# Patient Record
Sex: Male | Born: 1940 | Race: White | Hispanic: No | Smoking: Former smoker
Health system: Southern US, Community
[De-identification: ages and names within clinical notes are randomized; demographics above are authoritative.]

## PROBLEM LIST (undated history)

## (undated) DIAGNOSIS — I509 Heart failure, unspecified: Secondary | ICD-10-CM

## (undated) DIAGNOSIS — K5520 Angiodysplasia of colon without hemorrhage: Secondary | ICD-10-CM

## (undated) DIAGNOSIS — Z950 Presence of cardiac pacemaker: Secondary | ICD-10-CM

## (undated) DIAGNOSIS — J449 Chronic obstructive pulmonary disease, unspecified: Secondary | ICD-10-CM

## (undated) DIAGNOSIS — K922 Gastrointestinal hemorrhage, unspecified: Secondary | ICD-10-CM

## (undated) DIAGNOSIS — I10 Essential (primary) hypertension: Secondary | ICD-10-CM

## (undated) DIAGNOSIS — I4891 Unspecified atrial fibrillation: Secondary | ICD-10-CM

## (undated) HISTORY — DX: Essential (primary) hypertension: I10

## (undated) HISTORY — PX: PACEMAKER IMPLANT: EP1218

## (undated) HISTORY — DX: Chronic obstructive pulmonary disease, unspecified: J44.9

## (undated) HISTORY — PX: COLONOSCOPY: SHX174

## (undated) HISTORY — DX: Angiodysplasia of colon without hemorrhage: K55.20

## (undated) HISTORY — DX: Unspecified atrial fibrillation: I48.91

## (undated) HISTORY — DX: Heart failure, unspecified: I50.9

## (undated) HISTORY — DX: Gastrointestinal hemorrhage, unspecified: K92.2

---

## 2015-06-15 ENCOUNTER — Encounter: Payer: Self-pay | Admitting: Cardiology

## 2015-06-15 LAB — PROTIME-INR

## 2016-01-25 ENCOUNTER — Encounter: Payer: Self-pay | Admitting: Cardiology

## 2016-01-25 LAB — PROTIME-INR

## 2016-02-22 LAB — PROTIME-INR

## 2016-04-04 LAB — PROTIME-INR

## 2016-05-02 LAB — PROTIME-INR

## 2016-05-16 LAB — PROTIME-INR

## 2016-05-31 LAB — PROTIME-INR

## 2016-06-16 LAB — PROTIME-INR

## 2016-06-30 LAB — PROTIME-INR

## 2016-07-13 LAB — PROTIME-INR

## 2016-11-22 DIAGNOSIS — F3111 Bipolar disorder, current episode manic without psychotic features, mild: Secondary | ICD-10-CM | POA: Diagnosis not present

## 2016-11-23 DIAGNOSIS — R7989 Other specified abnormal findings of blood chemistry: Secondary | ICD-10-CM | POA: Diagnosis not present

## 2016-11-23 DIAGNOSIS — Z79899 Other long term (current) drug therapy: Secondary | ICD-10-CM | POA: Diagnosis not present

## 2016-11-28 DIAGNOSIS — I4891 Unspecified atrial fibrillation: Secondary | ICD-10-CM | POA: Diagnosis not present

## 2016-11-28 DIAGNOSIS — Z95810 Presence of automatic (implantable) cardiac defibrillator: Secondary | ICD-10-CM | POA: Diagnosis not present

## 2016-11-28 DIAGNOSIS — I428 Other cardiomyopathies: Secondary | ICD-10-CM | POA: Diagnosis not present

## 2016-11-28 DIAGNOSIS — Z4502 Encounter for adjustment and management of automatic implantable cardiac defibrillator: Secondary | ICD-10-CM | POA: Diagnosis not present

## 2016-12-27 DIAGNOSIS — I10 Essential (primary) hypertension: Secondary | ICD-10-CM | POA: Diagnosis not present

## 2016-12-27 DIAGNOSIS — J449 Chronic obstructive pulmonary disease, unspecified: Secondary | ICD-10-CM | POA: Diagnosis not present

## 2016-12-27 DIAGNOSIS — E785 Hyperlipidemia, unspecified: Secondary | ICD-10-CM | POA: Diagnosis not present

## 2017-03-29 DIAGNOSIS — I442 Atrioventricular block, complete: Secondary | ICD-10-CM | POA: Diagnosis not present

## 2017-03-29 DIAGNOSIS — I4891 Unspecified atrial fibrillation: Secondary | ICD-10-CM | POA: Diagnosis not present

## 2017-03-29 DIAGNOSIS — I428 Other cardiomyopathies: Secondary | ICD-10-CM | POA: Diagnosis not present

## 2017-03-29 DIAGNOSIS — Z4502 Encounter for adjustment and management of automatic implantable cardiac defibrillator: Secondary | ICD-10-CM | POA: Diagnosis not present

## 2017-05-16 DIAGNOSIS — R7989 Other specified abnormal findings of blood chemistry: Secondary | ICD-10-CM | POA: Diagnosis not present

## 2017-05-16 DIAGNOSIS — Z79899 Other long term (current) drug therapy: Secondary | ICD-10-CM | POA: Diagnosis not present

## 2017-05-16 DIAGNOSIS — F3111 Bipolar disorder, current episode manic without psychotic features, mild: Secondary | ICD-10-CM | POA: Diagnosis not present

## 2017-06-07 DIAGNOSIS — Z23 Encounter for immunization: Secondary | ICD-10-CM | POA: Diagnosis not present

## 2017-06-28 DIAGNOSIS — Z79899 Other long term (current) drug therapy: Secondary | ICD-10-CM | POA: Diagnosis not present

## 2017-06-28 DIAGNOSIS — E785 Hyperlipidemia, unspecified: Secondary | ICD-10-CM | POA: Diagnosis not present

## 2017-06-28 DIAGNOSIS — Z125 Encounter for screening for malignant neoplasm of prostate: Secondary | ICD-10-CM | POA: Diagnosis not present

## 2017-06-28 DIAGNOSIS — R7301 Impaired fasting glucose: Secondary | ICD-10-CM | POA: Diagnosis not present

## 2017-06-28 DIAGNOSIS — I10 Essential (primary) hypertension: Secondary | ICD-10-CM | POA: Diagnosis not present

## 2017-06-28 DIAGNOSIS — R5383 Other fatigue: Secondary | ICD-10-CM | POA: Diagnosis not present

## 2017-06-28 DIAGNOSIS — Z0000 Encounter for general adult medical examination without abnormal findings: Secondary | ICD-10-CM | POA: Diagnosis not present

## 2017-06-28 DIAGNOSIS — Z23 Encounter for immunization: Secondary | ICD-10-CM | POA: Diagnosis not present

## 2017-06-28 DIAGNOSIS — E569 Vitamin deficiency, unspecified: Secondary | ICD-10-CM | POA: Diagnosis not present

## 2017-06-28 DIAGNOSIS — Z1211 Encounter for screening for malignant neoplasm of colon: Secondary | ICD-10-CM | POA: Diagnosis not present

## 2017-06-28 DIAGNOSIS — J449 Chronic obstructive pulmonary disease, unspecified: Secondary | ICD-10-CM | POA: Diagnosis not present

## 2017-07-05 DIAGNOSIS — I442 Atrioventricular block, complete: Secondary | ICD-10-CM | POA: Diagnosis not present

## 2017-07-05 DIAGNOSIS — Z4502 Encounter for adjustment and management of automatic implantable cardiac defibrillator: Secondary | ICD-10-CM | POA: Diagnosis not present

## 2017-07-05 DIAGNOSIS — I428 Other cardiomyopathies: Secondary | ICD-10-CM | POA: Diagnosis not present

## 2017-08-20 DIAGNOSIS — Z86010 Personal history of colonic polyps: Secondary | ICD-10-CM | POA: Diagnosis not present

## 2017-09-06 DIAGNOSIS — K635 Polyp of colon: Secondary | ICD-10-CM | POA: Diagnosis not present

## 2017-09-06 DIAGNOSIS — Z86010 Personal history of colonic polyps: Secondary | ICD-10-CM | POA: Diagnosis not present

## 2017-09-06 DIAGNOSIS — K621 Rectal polyp: Secondary | ICD-10-CM | POA: Diagnosis not present

## 2017-09-06 DIAGNOSIS — D126 Benign neoplasm of colon, unspecified: Secondary | ICD-10-CM | POA: Diagnosis not present

## 2017-09-06 DIAGNOSIS — D120 Benign neoplasm of cecum: Secondary | ICD-10-CM | POA: Diagnosis not present

## 2017-10-10 DIAGNOSIS — F3111 Bipolar disorder, current episode manic without psychotic features, mild: Secondary | ICD-10-CM | POA: Diagnosis not present

## 2017-10-11 DIAGNOSIS — Z4502 Encounter for adjustment and management of automatic implantable cardiac defibrillator: Secondary | ICD-10-CM | POA: Diagnosis not present

## 2017-10-11 DIAGNOSIS — I442 Atrioventricular block, complete: Secondary | ICD-10-CM | POA: Diagnosis not present

## 2017-12-27 DIAGNOSIS — R7301 Impaired fasting glucose: Secondary | ICD-10-CM | POA: Diagnosis not present

## 2017-12-27 DIAGNOSIS — Z125 Encounter for screening for malignant neoplasm of prostate: Secondary | ICD-10-CM | POA: Diagnosis not present

## 2017-12-27 DIAGNOSIS — E785 Hyperlipidemia, unspecified: Secondary | ICD-10-CM | POA: Diagnosis not present

## 2017-12-27 DIAGNOSIS — J449 Chronic obstructive pulmonary disease, unspecified: Secondary | ICD-10-CM | POA: Diagnosis not present

## 2017-12-27 DIAGNOSIS — E569 Vitamin deficiency, unspecified: Secondary | ICD-10-CM | POA: Diagnosis not present

## 2017-12-27 DIAGNOSIS — R5383 Other fatigue: Secondary | ICD-10-CM | POA: Diagnosis not present

## 2017-12-27 DIAGNOSIS — I10 Essential (primary) hypertension: Secondary | ICD-10-CM | POA: Diagnosis not present

## 2017-12-27 DIAGNOSIS — F319 Bipolar disorder, unspecified: Secondary | ICD-10-CM | POA: Diagnosis not present

## 2017-12-27 DIAGNOSIS — F17200 Nicotine dependence, unspecified, uncomplicated: Secondary | ICD-10-CM | POA: Diagnosis not present

## 2018-01-17 DIAGNOSIS — I442 Atrioventricular block, complete: Secondary | ICD-10-CM | POA: Diagnosis not present

## 2018-01-17 DIAGNOSIS — I428 Other cardiomyopathies: Secondary | ICD-10-CM | POA: Diagnosis not present

## 2018-01-17 DIAGNOSIS — Z4502 Encounter for adjustment and management of automatic implantable cardiac defibrillator: Secondary | ICD-10-CM | POA: Diagnosis not present

## 2018-03-19 DIAGNOSIS — I4891 Unspecified atrial fibrillation: Secondary | ICD-10-CM | POA: Diagnosis not present

## 2018-03-19 DIAGNOSIS — I10 Essential (primary) hypertension: Secondary | ICD-10-CM | POA: Diagnosis not present

## 2018-04-22 DIAGNOSIS — Z4502 Encounter for adjustment and management of automatic implantable cardiac defibrillator: Secondary | ICD-10-CM | POA: Diagnosis not present

## 2018-04-22 DIAGNOSIS — I428 Other cardiomyopathies: Secondary | ICD-10-CM | POA: Diagnosis not present

## 2018-05-08 ENCOUNTER — Encounter: Payer: Self-pay | Admitting: Cardiology

## 2018-05-08 DIAGNOSIS — I509 Heart failure, unspecified: Secondary | ICD-10-CM | POA: Diagnosis not present

## 2018-05-08 DIAGNOSIS — Z0000 Encounter for general adult medical examination without abnormal findings: Secondary | ICD-10-CM | POA: Diagnosis not present

## 2018-05-08 DIAGNOSIS — E785 Hyperlipidemia, unspecified: Secondary | ICD-10-CM | POA: Diagnosis not present

## 2018-05-08 DIAGNOSIS — I10 Essential (primary) hypertension: Secondary | ICD-10-CM | POA: Diagnosis not present

## 2018-05-21 DIAGNOSIS — L600 Ingrowing nail: Secondary | ICD-10-CM | POA: Diagnosis not present

## 2018-05-21 DIAGNOSIS — M79674 Pain in right toe(s): Secondary | ICD-10-CM | POA: Diagnosis not present

## 2018-05-21 DIAGNOSIS — L03019 Cellulitis of unspecified finger: Secondary | ICD-10-CM | POA: Diagnosis not present

## 2018-07-09 DIAGNOSIS — I5022 Chronic systolic (congestive) heart failure: Secondary | ICD-10-CM | POA: Diagnosis not present

## 2018-07-09 DIAGNOSIS — Z23 Encounter for immunization: Secondary | ICD-10-CM | POA: Diagnosis not present

## 2018-07-09 DIAGNOSIS — H6122 Impacted cerumen, left ear: Secondary | ICD-10-CM | POA: Diagnosis not present

## 2018-07-09 DIAGNOSIS — D649 Anemia, unspecified: Secondary | ICD-10-CM | POA: Diagnosis not present

## 2018-07-09 DIAGNOSIS — H9193 Unspecified hearing loss, bilateral: Secondary | ICD-10-CM | POA: Diagnosis not present

## 2018-07-09 DIAGNOSIS — I482 Chronic atrial fibrillation: Secondary | ICD-10-CM | POA: Diagnosis not present

## 2018-07-09 DIAGNOSIS — J3489 Other specified disorders of nose and nasal sinuses: Secondary | ICD-10-CM | POA: Diagnosis not present

## 2018-07-30 DIAGNOSIS — Z77122 Contact with and (suspected) exposure to noise: Secondary | ICD-10-CM | POA: Diagnosis not present

## 2018-07-30 DIAGNOSIS — H6123 Impacted cerumen, bilateral: Secondary | ICD-10-CM | POA: Diagnosis not present

## 2018-07-30 DIAGNOSIS — H9193 Unspecified hearing loss, bilateral: Secondary | ICD-10-CM | POA: Diagnosis not present

## 2018-07-30 DIAGNOSIS — Z87891 Personal history of nicotine dependence: Secondary | ICD-10-CM | POA: Diagnosis not present

## 2018-07-30 DIAGNOSIS — Z7289 Other problems related to lifestyle: Secondary | ICD-10-CM | POA: Diagnosis not present

## 2018-08-13 ENCOUNTER — Encounter: Payer: Self-pay | Admitting: *Deleted

## 2018-08-13 ENCOUNTER — Ambulatory Visit (INDEPENDENT_AMBULATORY_CARE_PROVIDER_SITE_OTHER): Payer: Medicare Other | Admitting: Cardiology

## 2018-08-13 ENCOUNTER — Encounter: Payer: Self-pay | Admitting: Cardiology

## 2018-08-13 VITALS — BP 126/62 | HR 85 | Ht 72.0 in | Wt 192.0 lb

## 2018-08-13 DIAGNOSIS — I472 Ventricular tachycardia, unspecified: Secondary | ICD-10-CM

## 2018-08-13 DIAGNOSIS — I10 Essential (primary) hypertension: Secondary | ICD-10-CM | POA: Diagnosis not present

## 2018-08-13 DIAGNOSIS — I480 Paroxysmal atrial fibrillation: Secondary | ICD-10-CM | POA: Diagnosis not present

## 2018-08-13 DIAGNOSIS — I421 Obstructive hypertrophic cardiomyopathy: Secondary | ICD-10-CM | POA: Diagnosis not present

## 2018-08-13 DIAGNOSIS — D649 Anemia, unspecified: Secondary | ICD-10-CM | POA: Diagnosis not present

## 2018-08-13 NOTE — Progress Notes (Signed)
Electrophysiology Office Note   Date:  08/13/2018   ID:  Mike Gray, DOB Oct 20, 1941, MRN [id number]  PCP:  [facility]  Cardiologist:   Primary Electrophysiologist:  Rielly Brunn [name], MD    No chief complaint on file.    History of Present Illness: Mike Gray is a 77 y.o. male who is being seen today for the evaluation of ICD/hypertrophic cardiomyopathy at the request of Mike Gray, [name], MD. Presenting today for electrophysiology evaluation.  He has a history of atrial fibrillation, CHF, COPD, GI bleed, and hypertension.  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  Today, he denies symptoms of palpitations, chest pain, shortness of breath, orthopnea, PND, lower extremity edema, claudication, dizziness, presyncope, syncope, bleeding, or neurologic sequela. The patient is tolerating medications without difficulties.    Past Medical History:  Diagnosis Date  . Angiodysplasia   . Atrial fibrillation ([place])   . CHF (congestive heart failure) ([place])   . COPD (chronic obstructive pulmonary disease) ([place])   . GI bleed   . Hypertension    Past Surgical History:  Procedure Laterality Date  . COLONOSCOPY    . PACEMAKER IMPLANT       Current Outpatient Medications  Medication Sig Dispense Refill  . apixaban (ELIQUIS) 5 MG TABS tablet Take 5 mg by mouth 2 (two) times daily.    [name] Kitchen atorvastatin (LIPITOR) 20 MG tablet Take 20 mg by mouth daily.    . divalproex (DEPAKOTE) 500 MG DR tablet Take 1,000 mg by mouth daily.    [name] Kitchen losartan-hydrochlorothiazide (HYZAAR) 100-12.5 MG tablet Take 1 tablet by mouth daily.     No current facility-administered medications for this visit.     Allergies:   Patient has no known allergies.   Social History:  The patient  reports that he quit smoking about 6 years ago. His smoking use included cigarettes. He has never used smokeless tobacco. He  reports that he drank alcohol. He reports that he does not use drugs.   Family History:  The patient's family history includes Breast cancer (age of onset: 7) in his father; Cancer in his other; Healthy in his mother.    ROS:  Please see the history of present illness.   Otherwise, review of systems is positive for none.   All other systems are reviewed and negative.    PHYSICAL EXAM: VS:  BP 126/62   Pulse 85   Ht 6' (1.829 m)   Wt 192 lb (87.1 kg)   BMI 26.04 kg/m  , BMI Body mass index is 26.04 kg/m. GEN: Well nourished, well developed, in no acute distress  HEENT: normal  Neck: no JVD, carotid bruits, or masses Cardiac: RRR; no murmurs, rubs, or gallops,no edema  Respiratory:  clear to auscultation bilaterally, normal work of breathing GI: soft, nontender, nondistended, + BS MS: no deformity or atrophy  Skin: warm and dry, device pocket is well healed Neuro:  Strength and sensation are intact Psych: euthymic mood, full affect  EKG:  EKG is ordered today. Personal review of the ekg ordered shows AV paced, rate 85, PVC  Device interrogation is reviewed today in detail.  See PaceArt for details.   Recent Labs: No results found for requested labs within last 8760 hours.    Lipid Panel  No results found for: CHOL, TRIG, HDL, CHOLHDL, VLDL, LDLCALC, LDLDIRECT   Wt Readings from Last 3 Encounters:  08/13/18 192 lb (87.1 kg)      Other studies Reviewed: Additional studies/ records that were reviewed today include: Outside records    ASSESSMENT AND PLAN:  1.  Hypertrophic cardiomyopathy: Status post [place] ICD for nonsustained ventricular tachycardia.  Device functioning appropriately.  He is currently not on a beta-blocker but he is on losartan.  I Mike Gray work to get prior records from his prior cardiologist to see if a beta-blocker would be indicated.  2.  Paroxysmal atrial fibrillation: Currently in sinus rhythm.  He does have atrial fibrillation, though short  episodes noted on his device.  He has minimal episodes on his device interrogation.  Continue Eliquis.  This patients CHA2DS2-VASc Score and unadjusted Ischemic Stroke Rate (% per year) is equal to 3.2 % stroke rate/year from a score of 3  Above score calculated as 1 point each if present [CHF, HTN, DM, Vascular=MI/PAD/Aortic Plaque, Age if 65-74, or Male] Above score calculated as 2 points each if present [Age > 75, or Stroke/TIA/TE]  3.  Hypertension: Currently well controlled.  No changes.  4.  Nonsustained ventricular tachycardia: None seen on most recent device interrogation.  No changes.  Current medicines are reviewed at length with the patient today.   The patient does not have concerns regarding his medicines.  The following changes were made today:  none  Labs/ tests ordered today include:  Orders Placed This Encounter  Procedures  . EKG 12-Lead   Discussed with primary physician  Disposition:   FU with Mike Gray 6 months  Signed, Shirleen Mcfaul [name], MD  08/13/2018 3:11 PM     [address] [place] [address] [phone] (office) [phone] (fax)

## 2018-08-13 NOTE — Patient Instructions (Addendum)
Medication Instructions:  Your physician recommends that you continue on your current medications as directed. Please refer to the Current Medication list given to you today.  If you need a refill on your cardiac medications before your next appointment, please call your pharmacy.   Lab work: None ordered  Testing/Procedures: None ordered  Follow-Up: Remote monitoring is used to monitor your ICD from home. This monitoring reduces the number of office visits required to check your device to one time per year. It allows [place] to keep an eye on the functioning of your device to ensure it is working properly. You are scheduled for a device check from home on 11/12/2018. You may send your transmission at any time that day. If you have a wireless device, the transmission will be sent automatically. After your physician reviews your transmission, you will receive a postcard with your next transmission date.  At [facility], you and your health needs are our priority.  As part of our continuing mission to provide you with exceptional heart care, we have created designated Provider Care Teams.  These Care Teams include your primary Cardiologist (physician) and Advanced Practice Providers (APPs -  Physician Assistants and Nurse Practitioners) who all work together to provide you with the care you need, when you need it. You will need a follow up appointment in 6 months.  Please call our office 2 months in advance to schedule this appointment.  You may see [name] or one of the following Advanced Practice Providers on your designated Care Team:   Chanetta Marshall, NP . Tommye Standard, PA-C  Any Other Special Instructions Will Be Listed Below (If Applicable).  Thank you for choosing [facility]!!   Trinidad Curet, RN [phone]

## 2018-08-14 LAB — CUP PACEART INCLINIC DEVICE CHECK
Battery Voltage: 2.92 V
Brady Statistic AP VS Percent: 0.11 %
Brady Statistic AS VP Percent: 53.77 %
Brady Statistic RA Percent Paced: 45.31 %
HighPow Impedance: 81 Ohm
Implantable Lead Implant Date: 20070328
Implantable Lead Location: 753859
Implantable Lead Model: 5076
Implantable Pulse Generator Implant Date: 20150323
Lead Channel Impedance Value: 361 Ohm
Lead Channel Impedance Value: 418 Ohm
Lead Channel Impedance Value: 475 Ohm
Lead Channel Pacing Threshold Amplitude: 0.625 V
Lead Channel Pacing Threshold Pulse Width: 0.4 ms
Lead Channel Pacing Threshold Pulse Width: 0.4 ms
Lead Channel Sensing Intrinsic Amplitude: 10.25 mV
Lead Channel Sensing Intrinsic Amplitude: 10.25 mV
Lead Channel Setting Pacing Amplitude: 1.5 V
Lead Channel Setting Pacing Amplitude: 2 V
Lead Channel Setting Pacing Pulse Width: 0.4 ms
Lead Channel Setting Sensing Sensitivity: 0.3 mV
MDC IDC LEAD IMPLANT DT: 20150323
MDC IDC LEAD LOCATION: 753860
MDC IDC MSMT BATTERY REMAINING LONGEVITY: 41 mo
MDC IDC MSMT LEADCHNL RA PACING THRESHOLD AMPLITUDE: 0.625 V
MDC IDC MSMT LEADCHNL RA SENSING INTR AMPL: 1.375 mV
MDC IDC MSMT LEADCHNL RA SENSING INTR AMPL: 3.375 mV
MDC IDC SESS DTM: 20191022185546
MDC IDC STAT BRADY AP VP PERCENT: 45.72 %
MDC IDC STAT BRADY AS VS PERCENT: 0.41 %
MDC IDC STAT BRADY RV PERCENT PACED: 98.75 %

## 2018-08-20 DIAGNOSIS — H903 Sensorineural hearing loss, bilateral: Secondary | ICD-10-CM | POA: Diagnosis not present

## 2018-08-26 ENCOUNTER — Telehealth: Payer: Self-pay | Admitting: Cardiology

## 2018-08-26 NOTE — Telephone Encounter (Signed)
New message   Patient states that he wants you to know that he is the doughnut hole for hole for Eliquis. He states that you don't have to call him back he just wants you to know this information.

## 2018-08-27 NOTE — Telephone Encounter (Signed)
I called pt to offer him help with Pt Asst for his Eliquis..he didn't answer but identified himself on his VM so I left a msg stating that if he was interested with this help to call back and speak to someone in the PA dept.

## 2018-09-03 DIAGNOSIS — L738 Other specified follicular disorders: Secondary | ICD-10-CM | POA: Diagnosis not present

## 2018-09-03 DIAGNOSIS — D485 Neoplasm of uncertain behavior of skin: Secondary | ICD-10-CM | POA: Diagnosis not present

## 2018-09-03 DIAGNOSIS — L819 Disorder of pigmentation, unspecified: Secondary | ICD-10-CM | POA: Diagnosis not present

## 2018-09-04 ENCOUNTER — Other Ambulatory Visit: Payer: Self-pay

## 2018-10-17 ENCOUNTER — Ambulatory Visit
Admission: RE | Admit: 2018-10-17 | Discharge: 2018-10-17 | Disposition: A | Discharge status: Home / Self Care | Payer: Medicare Other | Source: Ambulatory Visit | Attending: Family Medicine | Admitting: Family Medicine

## 2018-10-17 ENCOUNTER — Other Ambulatory Visit: Payer: Self-pay | Admitting: Family Medicine

## 2018-10-17 DIAGNOSIS — R05 Cough: Secondary | ICD-10-CM

## 2018-10-17 DIAGNOSIS — R062 Wheezing: Secondary | ICD-10-CM | POA: Diagnosis not present

## 2018-10-17 DIAGNOSIS — I5022 Chronic systolic (congestive) heart failure: Secondary | ICD-10-CM | POA: Diagnosis not present

## 2018-10-17 DIAGNOSIS — R059 Cough, unspecified: Secondary | ICD-10-CM

## 2018-10-17 DIAGNOSIS — I482 Chronic atrial fibrillation, unspecified: Secondary | ICD-10-CM | POA: Diagnosis not present

## 2018-10-17 DIAGNOSIS — F319 Bipolar disorder, unspecified: Secondary | ICD-10-CM | POA: Diagnosis not present

## 2018-11-12 ENCOUNTER — Ambulatory Visit (INDEPENDENT_AMBULATORY_CARE_PROVIDER_SITE_OTHER): Payer: Medicare Other

## 2018-11-12 DIAGNOSIS — I4811 Longstanding persistent atrial fibrillation: Secondary | ICD-10-CM | POA: Diagnosis not present

## 2018-11-12 DIAGNOSIS — I5022 Chronic systolic (congestive) heart failure: Secondary | ICD-10-CM | POA: Diagnosis not present

## 2018-11-12 DIAGNOSIS — I421 Obstructive hypertrophic cardiomyopathy: Secondary | ICD-10-CM

## 2018-11-12 DIAGNOSIS — I472 Ventricular tachycardia, unspecified: Secondary | ICD-10-CM

## 2018-11-12 DIAGNOSIS — R251 Tremor, unspecified: Secondary | ICD-10-CM | POA: Diagnosis not present

## 2018-11-12 DIAGNOSIS — F319 Bipolar disorder, unspecified: Secondary | ICD-10-CM | POA: Diagnosis not present

## 2018-11-12 DIAGNOSIS — D649 Anemia, unspecified: Secondary | ICD-10-CM | POA: Diagnosis not present

## 2018-11-13 NOTE — Progress Notes (Signed)
Remote ICD transmission.   

## 2018-11-14 LAB — CUP PACEART REMOTE DEVICE CHECK
Battery Remaining Longevity: 34 mo
Brady Statistic AP VS Percent: 0.07 %
Brady Statistic AS VP Percent: 62.11 %
Date Time Interrogation Session: 20200121051604
HighPow Impedance: 75 Ohm
Implantable Lead Implant Date: 20150323
Implantable Lead Location: 753859
Implantable Lead Model: 5076
Implantable Pulse Generator Implant Date: 20150323
Lead Channel Impedance Value: 418 Ohm
Lead Channel Pacing Threshold Amplitude: 0.75 V
Lead Channel Pacing Threshold Pulse Width: 0.4 ms
Lead Channel Sensing Intrinsic Amplitude: 1.625 mV
Lead Channel Sensing Intrinsic Amplitude: 8.25 mV
Lead Channel Sensing Intrinsic Amplitude: 8.25 mV
Lead Channel Setting Pacing Amplitude: 2 V
MDC IDC LEAD IMPLANT DT: 20070328
MDC IDC LEAD LOCATION: 753860
MDC IDC MSMT BATTERY VOLTAGE: 2.9 V
MDC IDC MSMT LEADCHNL RA IMPEDANCE VALUE: 475 Ohm
MDC IDC MSMT LEADCHNL RA PACING THRESHOLD AMPLITUDE: 0.625 V
MDC IDC MSMT LEADCHNL RA SENSING INTR AMPL: 1.625 mV
MDC IDC MSMT LEADCHNL RV IMPEDANCE VALUE: 361 Ohm
MDC IDC MSMT LEADCHNL RV PACING THRESHOLD PULSEWIDTH: 0.4 ms
MDC IDC SET LEADCHNL RV PACING AMPLITUDE: 2.5 V
MDC IDC SET LEADCHNL RV PACING PULSEWIDTH: 0.4 ms
MDC IDC SET LEADCHNL RV SENSING SENSITIVITY: 0.3 mV
MDC IDC STAT BRADY AP VP PERCENT: 36.99 %
MDC IDC STAT BRADY AS VS PERCENT: 0.83 %
MDC IDC STAT BRADY RA PERCENT PACED: 36.29 %
MDC IDC STAT BRADY RV PERCENT PACED: 97.88 %

## 2018-11-15 ENCOUNTER — Encounter: Payer: Self-pay | Admitting: Cardiology

## 2018-12-09 ENCOUNTER — Ambulatory Visit (INDEPENDENT_AMBULATORY_CARE_PROVIDER_SITE_OTHER): Payer: Medicare Other | Admitting: Podiatry

## 2018-12-09 DIAGNOSIS — B351 Tinea unguium: Secondary | ICD-10-CM | POA: Diagnosis not present

## 2018-12-09 DIAGNOSIS — L600 Ingrowing nail: Secondary | ICD-10-CM | POA: Diagnosis not present

## 2018-12-09 DIAGNOSIS — Z7901 Long term (current) use of anticoagulants: Secondary | ICD-10-CM | POA: Diagnosis not present

## 2018-12-09 NOTE — Patient Instructions (Signed)

## 2018-12-17 NOTE — Progress Notes (Signed)
Subjective:   Patient ID: Mike Gray, male   DOB: 78 y.o.   MRN: [id number]   HPI  78 year old male presents the office today for concerns of both of his big toenails becoming ingrown.  He has noticed a blood blister present on the left big toe on the toenail itself.  He states he has not notice any drainage or pus.  He has no significant discomfort at the area.  He has no red streaks.  No recent treatment.  He is currently on Eliquis.   Review of Systems  All other systems reviewed and are negative.  Past Medical History:  Diagnosis Date  . Angiodysplasia   . Atrial fibrillation ([place])   . CHF (congestive heart failure) ([place])   . COPD (chronic obstructive pulmonary disease) ([place])   . GI bleed   . Hypertension     Past Surgical History:  Procedure Laterality Date  . COLONOSCOPY    . PACEMAKER IMPLANT       Current Outpatient Medications:  .  amoxicillin-clavulanate (AUGMENTIN) 875-125 MG tablet, , Disp: , Rfl:  .  apixaban (ELIQUIS) 5 MG TABS tablet, Take 5 mg by mouth 2 (two) times daily., Disp: , Rfl:  .  atorvastatin (LIPITOR) 20 MG tablet, Take 20 mg by mouth daily., Disp: , Rfl:  .  azithromycin (ZITHROMAX) 250 MG tablet, , Disp: , Rfl:  .  CHANTIX CONTINUING MONTH PAK 1 MG tablet, , Disp: , Rfl:  .  divalproex (DEPAKOTE ER) 500 MG 24 hr tablet, , Disp: , Rfl:  .  divalproex (DEPAKOTE) 500 MG DR tablet, Take 1,000 mg by mouth daily., Disp: , Rfl:  .  losartan-hydrochlorothiazide (HYZAAR) 100-12.5 MG tablet, Take 1 tablet by mouth daily., Disp: , Rfl:  .  predniSONE (DELTASONE) 10 MG tablet, , Disp: , Rfl:   No Known Allergies       Objective:  Physical Exam  General: AAO x3, NAD  Dermatological: Bilateral hallux toenails are hypertrophic, dystrophic with yellow discoloration there is incurvation present to both the medial and lateral nail borders.  Specifically in the left hallux, medial aspect there is some dried blood, old hemorrhagic blister present at the  distal portion of the toenail on the ingrowing portion.  There is no drainage or pus identified today.  There is localized edema but there is no significant erythema or increase in warmth.  No ascending cellulitis.  No open lesions.  Vascular: Dorsalis Pedis artery and Posterior Tibial artery pedal pulses are 2/4 bilateral with immedate capillary fill time.  There is no pain with calf compression, swelling, warmth, erythema.   Neruologic: Grossly intact via light touch bilateral.   Musculoskeletal: No gross boney pedal deformities bilateral. No pain, crepitus, or limitation noted with foot and ankle range of motion bilateral. Muscular strength 5/5 in all groups tested bilateral.     Assessment:   78 year old male with ingrown toenails; posterior left hallux     Plan:  -Treatment options discussed including all alternatives, risks, and complications -Etiology of symptoms were discussed -Today I performed a slant back procedure to remove the symptomatic portion of ingrown toenails bilateral hallux.  Specifically the left hallux there was a blister.  After I debrided the toenail most of this old skin came off and there is new, healthy skin present.  There is no drainage or pus or any signs of an abscess.  I want her to do Epson salt soaks and dry thoroughly afterwards.  Also discussed with him antibiotic ointment  and a bandage to the toes.  If symptoms continue discussed nail avulsion. -Monitor for any clinical signs or symptoms of infection and directed to call the office immediately should any occur or go to the ER.  Return in about 2 weeks (around 12/23/2018).

## 2018-12-23 ENCOUNTER — Ambulatory Visit (INDEPENDENT_AMBULATORY_CARE_PROVIDER_SITE_OTHER): Payer: Medicare Other | Admitting: Podiatry

## 2018-12-23 ENCOUNTER — Encounter: Payer: Self-pay | Admitting: Podiatry

## 2018-12-23 DIAGNOSIS — B351 Tinea unguium: Secondary | ICD-10-CM | POA: Diagnosis not present

## 2018-12-23 DIAGNOSIS — Z7901 Long term (current) use of anticoagulants: Secondary | ICD-10-CM

## 2018-12-23 DIAGNOSIS — L600 Ingrowing nail: Secondary | ICD-10-CM | POA: Diagnosis not present

## 2018-12-24 NOTE — Progress Notes (Signed)
Subjective: 78 year old male presents the office today for follow-up evaluation of ingrowing on both of his big toenails.  He states that after I trimmed the last appointment he is been doing well not having any issues.  He has been soaking in Epson salts.  He denies any pain, redness, drainage or pus or any red streaks.  He states that he would like to get him scheduled to get his nails trimmed given that he is on blood thinners and his nails are thick and he cannot trim them. Denies any systemic complaints such as fevers, chills, nausea, vomiting. No acute changes since last appointment, and no other complaints at this time.   Objective: AAO x3, NAD DP/PT pulses palpable bilaterally, CRT less than 3 seconds Nails overall are hypertrophic, dystrophic with yellow discoloration.  There is incurvation present the bilateral hallux toenails but today there is no edema, erythema, drainage or pus there is no clinical signs of infection there is no blistering present.  The symptoms appear to be resolved. No open lesions or pre-ulcerative lesions.  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  -Overall the symptoms dating this point have resolved.  As a courtesy I debrided all the toenails today without any complications or bleeding.  Monitor any reoccurrence.  We will see him back next 9 to 12 weeks for nail trim or sooner if any issues are to arise. -Patient encouraged to call the office with any questions, concerns, change in symptoms.   Trula Slade DPM

## 2019-01-06 ENCOUNTER — Telehealth: Payer: Self-pay | Admitting: Cardiology

## 2019-01-06 NOTE — Telephone Encounter (Signed)
Medical records received from [facility]. 01/06/19 vlm

## 2019-02-04 ENCOUNTER — Telehealth: Payer: Self-pay | Admitting: *Deleted

## 2019-02-04 NOTE — Telephone Encounter (Signed)
Called patient to let them know due to recent COVID19 CDC and Health Department Protocols, we are not seeing patients in the office. We are instead seeing if they would like to schedule this appointment as a Virtual Appointment VIA Smartphone or Laptop. Unable to reach patient. LVMTCB  

## 2019-02-06 NOTE — Telephone Encounter (Signed)
Called patient to let them know due to recent [place] and Health Department Protocols, we are not seeing patients in the office. We are instead seeing if they would like to schedule this appointment as a Nurse, children's or Laptop. Patient is aware if they decide to reschedule this appointment, they may not be seen or scheduled for the next 4-6 months. Patient does not have computer, internet, or smart phone access. Message sent scheduling and nurse.   Concerns and/or Complaints:                    Since your last visit or hospitalization:   1. Have you been having new or worsening chest pain? NO 2. Have you been having new or worsening shortness of breath? Due to COPD 3. Have you been having new or worsening leg swelling, wt gain, or increase in abdominal girth (pants fitting more tightly)? His normal swelling 4. Have you had any passing out spells? NO 5. Have you had any extreme tiredness or fatigue? NO

## 2019-02-11 ENCOUNTER — Telehealth (INDEPENDENT_AMBULATORY_CARE_PROVIDER_SITE_OTHER): Payer: Medicare Other | Admitting: Cardiology

## 2019-02-11 ENCOUNTER — Encounter: Payer: Self-pay | Admitting: Cardiology

## 2019-02-11 ENCOUNTER — Ambulatory Visit (INDEPENDENT_AMBULATORY_CARE_PROVIDER_SITE_OTHER): Payer: Medicare Other | Admitting: *Deleted

## 2019-02-11 ENCOUNTER — Other Ambulatory Visit: Payer: Self-pay

## 2019-02-11 DIAGNOSIS — I10 Essential (primary) hypertension: Secondary | ICD-10-CM | POA: Diagnosis not present

## 2019-02-11 DIAGNOSIS — I4729 Other ventricular tachycardia: Secondary | ICD-10-CM

## 2019-02-11 DIAGNOSIS — I472 Ventricular tachycardia, unspecified: Secondary | ICD-10-CM

## 2019-02-11 DIAGNOSIS — I480 Paroxysmal atrial fibrillation: Secondary | ICD-10-CM

## 2019-02-11 DIAGNOSIS — I422 Other hypertrophic cardiomyopathy: Secondary | ICD-10-CM

## 2019-02-11 DIAGNOSIS — I421 Obstructive hypertrophic cardiomyopathy: Secondary | ICD-10-CM

## 2019-02-11 LAB — CUP PACEART REMOTE DEVICE CHECK
Battery Remaining Longevity: 30 mo
Battery Voltage: 2.89 V
Brady Statistic AP VP Percent: 55.11 %
Brady Statistic AP VS Percent: 0.11 %
Brady Statistic AS VP Percent: 44.4 %
Brady Statistic AS VS Percent: 0.38 %
Brady Statistic RA Percent Paced: 54.7 %
Brady Statistic RV Percent Paced: 98.6 %
Date Time Interrogation Session: 20200421073623
HighPow Impedance: 80 Ohm
Implantable Lead Implant Date: 20070328
Implantable Lead Implant Date: 20150323
Implantable Lead Location: 753859
Implantable Lead Location: 753860
Implantable Lead Model: 5076
Implantable Pulse Generator Implant Date: 20150323
Lead Channel Impedance Value: 342 Ohm
Lead Channel Impedance Value: 418 Ohm
Lead Channel Impedance Value: 456 Ohm
Lead Channel Pacing Threshold Amplitude: 0.625 V
Lead Channel Pacing Threshold Amplitude: 0.625 V
Lead Channel Pacing Threshold Pulse Width: 0.4 ms
Lead Channel Pacing Threshold Pulse Width: 0.4 ms
Lead Channel Sensing Intrinsic Amplitude: 1.625 mV
Lead Channel Sensing Intrinsic Amplitude: 1.625 mV
Lead Channel Sensing Intrinsic Amplitude: 11.875 mV
Lead Channel Sensing Intrinsic Amplitude: 11.875 mV
Lead Channel Setting Pacing Amplitude: 2 V
Lead Channel Setting Pacing Amplitude: 2.5 V
Lead Channel Setting Pacing Pulse Width: 0.4 ms
Lead Channel Setting Sensing Sensitivity: 0.3 mV

## 2019-02-11 MED ORDER — METOPROLOL SUCCINATE ER 50 MG PO TB24: 50.0000 mg | ORAL_TABLET | Freq: Every day | ORAL | 6 refills | Status: DC

## 2019-02-11 NOTE — Progress Notes (Signed)
Electrophysiology TeleHealth Note   Due to national recommendations of social distancing due to COVID 19, an audio/video telehealth visit is felt to be most appropriate for this patient at this time.  See Epic message for the patient's consent to telehealth for [facility].   Date:  02/11/2019   ID:  Allyson Sabal, DOB April 29, 1941, MRN [id number]  Location: patient's home  Provider location: [address] [place]  Evaluation Performed: Follow-up visit  PCP:  Constance Haw, MD  Cardiologist:  No primary care provider on file.  Electrophysiologist:  Dr Curt Bears  Chief Complaint:  AF  History of Present Illness:    Kaiel Weide is a 78 y.o. male who presents via audio/video conferencing for a telehealth visit today.  Since last being seen in our clinic, the patient reports doing very well.  Today, he denies symptoms of palpitations, chest pain, shortness of breath,  lower extremity edema, dizziness, presyncope, or syncope.  The patient is otherwise without complaint today.  The patient denies symptoms of fevers, chills, cough, or new SOB worrisome for COVID 19.  Today, denies symptoms of palpitations, chest pain, shortness of breath, orthopnea, PND, lower extremity edema, claudication, dizziness, presyncope, syncope, bleeding, or neurologic sequela. The patient is tolerating medications without difficulties.  He is overall feeling well.  He has had no chest pain or shortness of breath.  He was noted to have quite a bit of nonsustained VT on his ICD.  He was asymptomatic from this.  He has had no further episodes of atrial fibrillation.  Past Medical History:  Diagnosis Date  . Angiodysplasia   . Atrial fibrillation ([place])   . CHF (congestive heart failure) ([place])   . COPD (chronic obstructive pulmonary disease) ([place])   . GI bleed   . Hypertension     Past Surgical History:  Procedure Laterality Date  . COLONOSCOPY    . PACEMAKER IMPLANT      Current  Outpatient Medications  Medication Sig Dispense Refill  . amoxicillin-clavulanate (AUGMENTIN) 875-125 MG tablet     . apixaban (ELIQUIS) 5 MG TABS tablet Take 5 mg by mouth 2 (two) times daily.    [name] Kitchen atorvastatin (LIPITOR) 20 MG tablet Take 20 mg by mouth daily.    [name] Kitchen azithromycin (ZITHROMAX) 250 MG tablet     . CHANTIX CONTINUING MONTH PAK 1 MG tablet     . divalproex (DEPAKOTE ER) 500 MG 24 hr tablet     . divalproex (DEPAKOTE) 500 MG DR tablet Take 1,000 mg by mouth daily.    [name] Kitchen losartan-hydrochlorothiazide (HYZAAR) 100-12.5 MG tablet Take 1 tablet by mouth daily.    . predniSONE (DELTASONE) 10 MG tablet      No current facility-administered medications for this visit.     Allergies:   Patient has no known allergies.   Social History:  The patient  reports that he quit smoking about 7 years ago. His smoking use included cigarettes. He has never used smokeless tobacco. He reports previous alcohol use. He reports that he does not use drugs.   Family History:  The patient's  family history includes Breast cancer (age of onset: 87) in his father; Cancer in an other family member; Healthy in his mother.   ROS:  Please see the history of present illness.   All other systems are personally reviewed and negative.    Exam:    Vital Signs:  There were no vitals taken for this visit.  Over the phone,  no acute distress, no shortness of breath.  Labs/Other Tests and Data Reviewed:    Recent Labs: No results found for requested labs within last 8760 hours.   Wt Readings from Last 3 Encounters:  08/13/18 192 lb (87.1 kg)     Other studies personally reviewed: Additional studies/ records that were reviewed today include: ECG 08/13/18  Review of the above records today demonstrates:  AV paced, PVCs  Last device remote is reviewed from [place] PDF dated 11/14/2018 which reveals normal device function, multiple episodes of NSVT   ASSESSMENT & PLAN:    1.  Hypertrophic cardiomyopathy:  Status post [place] ICD for nonsustained ventricular tachycardia.  Device functioning appropriately.  He does continue to have nonsustained VT.  We Ta[place] Downum thus start him on 50 mg of Toprol-XL.  2.  Paroxysmal atrial fibrillation: None found on most recent device interrogation.  Continue Eliquis.  This patients CHA2DS2-VASc Score and unadjusted Ischemic Stroke Rate (% per year) is equal to 3.2 % stroke rate/year from a score of 3  Above score calculated as 1 point each if present [CHF, HTN, DM, Vascular=MI/PAD/Aortic Plaque, Age if 65-74, or Male] Above score calculated as 2 points each if present [Age > 75, or Stroke/TIA/TE]  3.  Hypertension: Has not been checked recently.  Was normal at his last clinic visit.  4.  Nonsustained VT: Has had more on most recent device interrogation.  We Areanna Gengler start him on metoprolol.  COVID 19 screen The patient denies symptoms of COVID 19 at this time.  The importance of social distancing was discussed today.  Follow-up: 6 months Next remote: Today  Current medicines are reviewed at length with the patient today.   The patient does not have concerns regarding his medicines.  The following changes were made today: Start Toprol-XL 50 mg  Labs/ tests ordered today include:  No orders of the defined types were placed in this encounter.  Due to the fact the patient does not have a smart phone nor a computer, this visit was done via telephone.  Patient Risk:  after full review of this patients clinical status, I feel that they are at moderate risk at this time.  Today, I have spent 13 minutes with the patient with telehealth technology discussing ICD, VT, atrial fibrillation.    Signed, Drinda Belgard [name], MD  02/11/2019 11:58 AM     [address] [place] [place] [address] [phone] (office) [phone] (fax)

## 2019-02-11 NOTE — Telephone Encounter (Signed)

## 2019-02-11 NOTE — Addendum Note (Signed)
Addended by: Stanton Kidney on: 02/11/2019 12:24 PM   Modules accepted: Orders

## 2019-02-18 NOTE — Progress Notes (Signed)
Remote ICD transmission.   

## 2019-02-27 ENCOUNTER — Telehealth: Payer: Self-pay | Admitting: Cardiovascular Disease

## 2019-02-27 NOTE — Telephone Encounter (Signed)
New Message:    Please call, pt said he received a letter.

## 2019-02-27 NOTE — Telephone Encounter (Signed)
LMOVM for pt to return call 

## 2019-02-28 NOTE — Telephone Encounter (Signed)
2nd attempt  LMOVM for pt to return call.  

## 2019-03-03 NOTE — Telephone Encounter (Signed)
LMOVM for pt to return call to my direct office number. I will try to answer any questions he may have about the letter he received.

## 2019-03-04 NOTE — Telephone Encounter (Signed)
LMOVM for pt to call the device clinic to get answers to his questions about the letter he has received.

## 2019-03-05 NOTE — Telephone Encounter (Signed)
LMOVM. This is the 3rd attempt to call the pt to answer his questions about the letter he received.

## 2019-03-24 ENCOUNTER — Other Ambulatory Visit: Payer: Self-pay

## 2019-03-24 ENCOUNTER — Ambulatory Visit (INDEPENDENT_AMBULATORY_CARE_PROVIDER_SITE_OTHER): Payer: Medicare Other | Admitting: Podiatry

## 2019-03-24 ENCOUNTER — Encounter: Payer: Self-pay | Admitting: Podiatry

## 2019-03-24 DIAGNOSIS — L03032 Cellulitis of left toe: Secondary | ICD-10-CM

## 2019-03-24 DIAGNOSIS — B351 Tinea unguium: Secondary | ICD-10-CM | POA: Diagnosis not present

## 2019-03-24 DIAGNOSIS — M79675 Pain in left toe(s): Secondary | ICD-10-CM | POA: Diagnosis not present

## 2019-03-24 DIAGNOSIS — M79674 Pain in right toe(s): Secondary | ICD-10-CM | POA: Diagnosis not present

## 2019-03-24 DIAGNOSIS — L600 Ingrowing nail: Secondary | ICD-10-CM | POA: Diagnosis not present

## 2019-03-24 MED ORDER — AMOXICILLIN-POT CLAVULANATE 500-125 MG PO TABS: 1.0000 | ORAL_TABLET | Freq: Three times a day (TID) | ORAL | 0 refills | Status: AC

## 2019-03-24 NOTE — Patient Instructions (Addendum)
EPSOM SALT FOOT SOAK INSTRUCTIONS  1.  Place 1/4 cup of epsom salts in 2 quarts of warm tap water. IF YOU ARE DIABETIC, OR HAVE NEUROPATHY,  CHECK THE TEMPERATURE OF THE WATER WITH YOUR ELBOW.  2.  Submerge your foot/feet in the solution and soak for 15 minutes.      3.  Next, remove your foot or feet from solution, blot dry the affected area.    4.  Apply antibiotic ointment and cover with fabric band-aid .  5.  This soak should be done once a day for 10 days.   6.  Monitor for any signs/symptoms of infection such as redness, swelling, odor, drainage, increased pain,  or non-healing of digit.   7.  Please do not hesitate to call the office and speak to a Nurse or Doctor if you have questions.   8.  If you experience fever, chills, nightsweats, nausea or vomiting with worsening of digit, please go to the emergency room.

## 2019-03-29 NOTE — Progress Notes (Signed)
Subjective:  Mike Gray presents to clinic today with cc of painful left hallux secondary to ingrown toenail. He states his toe was fine until it started to grow out again. Pain is aggravated when walking barefoot and when  wearing enclosed shoe gear.  Camnitz, Mike Doyne, MD is his PCP.   Mike Gray is also on blood thinner, Mike Gray.  Medications reviewed.  No Known Allergies   Objective:  Physical Examination:  Vascular Examination: Capillary refill time immediate x 10 digits.  Palpable DP/PT pulses b/l.  Digital hair sparse b/l.  No edema noted b/l.  Skin temperature gradient WNL b/l.  Dermatological Examination: Skin with normal turgor, texture and tone b/l.  No open wounds b/l.  No interdigital macerations noted b/l.  Elongated, thick, discolored brittle toenails with subungual debris and pain on dorsal palpation of nailbeds 1-5 b/l.  Incurvated nailplate left hallux lateral border with tenderness to palpation. +Serosanguinous drainage noted. Mild erythema at distal tip with incarcerated nailplate at distal tip.  +Nail border hypertrophy.  Mild incurvated right hallux b/l borders. No erythema, no edema, no drainage, no flocculence noted.  Musculoskeletal Examination: Muscle strength 5/5 to all muscle groups b/l  No pain, crepitus or joint discomfort with active/passive ROM.  Neurological Examination: Sensation intact 5/5 b/l with 10 gram monofilament.  Vibratory sensation intact b/l.  Proprioceptive sensation intact b/l.  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 Slantback procedure and curretage of offending nail borders performed both borders b/l hallux. No underlying abscess appreciated. Bleeding left hallux controlled with Lumicain Hemostatic solution. Antibiotic ointment and light compressive dressing applied to left hallux. Discussed permanent procedure for  chronic ingrown toenails should he continue to have this problem. Discussed the fact that he is on blood thinners and this must be taken into consideration. Will revisit topic on next visit. 2. Toenails 2-5 debrided in length and girth without incident. 3. Dispensed written soaking instructions for Mike Gray. 4. Prescription sent for Augmentin 500 mg po tid x 10 days. 5. Continue soft, supportive shoe gear daily as tolerated. 6. Report any pedal injuries to medical professional. 7. Follow up 1 week. 8. Patient/POA to call should there be a question/concern in there interim.

## 2019-04-07 ENCOUNTER — Encounter: Payer: Self-pay | Admitting: Podiatry

## 2019-04-07 ENCOUNTER — Ambulatory Visit (INDEPENDENT_AMBULATORY_CARE_PROVIDER_SITE_OTHER): Payer: Medicare Other | Admitting: Podiatry

## 2019-04-07 ENCOUNTER — Other Ambulatory Visit: Payer: Self-pay

## 2019-04-07 VITALS — Temp 98.2°F

## 2019-04-07 DIAGNOSIS — L600 Ingrowing nail: Secondary | ICD-10-CM

## 2019-04-07 NOTE — Patient Instructions (Signed)

## 2019-04-07 NOTE — Progress Notes (Signed)
Subjective:  Mike Gray presents to clinic today for follow up of left great toe ingrown nail. Patient states his toe feels much better. He has completed antibiotics and has soaked his foot as instructed. He is wearing enclosed shoe gear and experiences no pain when wearing them.   Camnitz, Ocie Doyne, MD is his PCP.   Current Outpatient Medications:  .  apixaban (ELIQUIS) 5 MG TABS tablet, Take 5 mg by mouth 2 (two) times daily., Disp: , Rfl:  .  atorvastatin (LIPITOR) 20 MG tablet, Take 20 mg by mouth daily., Disp: , Rfl:  .  azithromycin (ZITHROMAX) 250 MG tablet, , Disp: , Rfl:  .  CHANTIX CONTINUING MONTH PAK 1 MG tablet, , Disp: , Rfl:  .  divalproex (DEPAKOTE ER) 500 MG 24 hr tablet, , Disp: , Rfl:  .  divalproex (DEPAKOTE) 500 MG DR tablet, Take 1,000 mg by mouth daily., Disp: , Rfl:  .  losartan-hydrochlorothiazide (HYZAAR) 100-12.5 MG tablet, Take 1 tablet by mouth daily., Disp: , Rfl:  .  metoprolol succinate (TOPROL-XL) 50 MG 24 hr tablet, Take 1 tablet (50 mg total) by mouth daily. Take with or immediately following a meal., Disp: 30 tablet, Rfl: 6 .  predniSONE (DELTASONE) 10 MG tablet, , Disp: , Rfl:    No Known Allergies   Objective: Vitals:   04/07/19 1351  Temp: 98.2 F (36.8 C)    Physical Examination:  Vascular Examination: Capillary refill time immediate x 10 digits.  Palpable DP/PT pulses b/l.  Digital hair sparse b/l.  No edema noted b/l.  Skin temperature gradient WNL b/l.  Dermatological Examination: Skin with normal turgor, texture and tone b/l.  No open wounds b/l.  No interdigital macerations noted b/l.  Toenails 2-5 b/l and right great toe with signs of recent debridement.   Left great toe with evidence of slantback procedure both borders with no erythema, no edema, no warmth,  no drainage. No tenderness to palpation.  Musculoskeletal Examination: Muscle strength 5/5 to all muscle groups b/l  No pain, crepitus or joint discomfort  with active/passive ROM.  Neurological Examination: Sensation intact 5/5 b/l with 10 gram monofilament.  Vibratory sensation intact b/l.  Proprioceptive sensation intact b/l.  Assessment: Ingrown toenail left hallux, resolved  Plan: 1. Resume normal daily activity.  2.  Continue soft, supportive shoe gear daily. 3.  Report any pedal injuries to medical professional. 4.  In order to avoid ingrown nails, he needs to be seen every 9 weeks and we should be able to prevent recurrence of problem. 5.  Follow up 7 weeks for routine foot care appointment. 5.  Patient/POA to call should there be a question/concern in there interim.

## 2019-05-13 ENCOUNTER — Ambulatory Visit (INDEPENDENT_AMBULATORY_CARE_PROVIDER_SITE_OTHER): Payer: Medicare Other | Admitting: *Deleted

## 2019-05-13 DIAGNOSIS — Z0000 Encounter for general adult medical examination without abnormal findings: Secondary | ICD-10-CM | POA: Diagnosis not present

## 2019-05-13 DIAGNOSIS — I472 Ventricular tachycardia, unspecified: Secondary | ICD-10-CM

## 2019-05-13 DIAGNOSIS — I5022 Chronic systolic (congestive) heart failure: Secondary | ICD-10-CM | POA: Diagnosis not present

## 2019-05-13 DIAGNOSIS — F319 Bipolar disorder, unspecified: Secondary | ICD-10-CM | POA: Diagnosis not present

## 2019-05-13 DIAGNOSIS — R0989 Other specified symptoms and signs involving the circulatory and respiratory systems: Secondary | ICD-10-CM | POA: Diagnosis not present

## 2019-05-13 DIAGNOSIS — Z0001 Encounter for general adult medical examination with abnormal findings: Secondary | ICD-10-CM | POA: Diagnosis not present

## 2019-05-13 DIAGNOSIS — Z1211 Encounter for screening for malignant neoplasm of colon: Secondary | ICD-10-CM | POA: Diagnosis not present

## 2019-05-13 DIAGNOSIS — D649 Anemia, unspecified: Secondary | ICD-10-CM | POA: Diagnosis not present

## 2019-05-13 LAB — CUP PACEART REMOTE DEVICE CHECK
Battery Remaining Longevity: 29 mo
Battery Voltage: 2.95 V
Brady Statistic AP VP Percent: 82.08 %
Brady Statistic AP VS Percent: 0.19 %
Brady Statistic AS VP Percent: 17.57 %
Brady Statistic AS VS Percent: 0.16 %
Brady Statistic RA Percent Paced: 81.8 %
Brady Statistic RV Percent Paced: 98.21 %
Date Time Interrogation Session: 20200721041603
HighPow Impedance: 78 Ohm
Implantable Lead Implant Date: 20070328
Implantable Lead Implant Date: 20150323
Implantable Lead Location: 753859
Implantable Lead Location: 753860
Implantable Lead Model: 5076
Implantable Pulse Generator Implant Date: 20150323
Lead Channel Impedance Value: 361 Ohm
Lead Channel Impedance Value: 418 Ohm
Lead Channel Impedance Value: 513 Ohm
Lead Channel Pacing Threshold Amplitude: 0.625 V
Lead Channel Pacing Threshold Amplitude: 0.625 V
Lead Channel Pacing Threshold Pulse Width: 0.4 ms
Lead Channel Pacing Threshold Pulse Width: 0.4 ms
Lead Channel Sensing Intrinsic Amplitude: 1.75 mV
Lead Channel Sensing Intrinsic Amplitude: 1.75 mV
Lead Channel Sensing Intrinsic Amplitude: 13.125 mV
Lead Channel Sensing Intrinsic Amplitude: 13.125 mV
Lead Channel Setting Pacing Amplitude: 2 V
Lead Channel Setting Pacing Amplitude: 2.5 V
Lead Channel Setting Pacing Pulse Width: 0.4 ms
Lead Channel Setting Sensing Sensitivity: 0.3 mV

## 2019-05-26 ENCOUNTER — Encounter: Payer: Self-pay | Admitting: Podiatry

## 2019-05-26 ENCOUNTER — Other Ambulatory Visit: Payer: Self-pay

## 2019-05-26 ENCOUNTER — Ambulatory Visit (INDEPENDENT_AMBULATORY_CARE_PROVIDER_SITE_OTHER): Payer: Medicare Other | Admitting: Podiatry

## 2019-05-26 VITALS — Temp 96.3°F

## 2019-05-26 DIAGNOSIS — B351 Tinea unguium: Secondary | ICD-10-CM

## 2019-05-26 DIAGNOSIS — M79674 Pain in right toe(s): Secondary | ICD-10-CM

## 2019-05-26 DIAGNOSIS — M79675 Pain in left toe(s): Secondary | ICD-10-CM | POA: Diagnosis not present

## 2019-05-26 NOTE — Patient Instructions (Signed)

## 2019-05-27 NOTE — Progress Notes (Signed)
Subjective: Mike Gray is a 78 y.o. y.o. male who is on long term blood thinner Eliquis, presents today with painful, discolored, thick toenails  which interfere with daily activities. Pain is aggravated when wearing enclosed shoe gear. Pain is relieved with periodic professional debridement.  Patient's PCP is Mike Haw, MD and last date seen was 02/11/2019.   Current Outpatient Medications:  .  apixaban (ELIQUIS) 5 MG TABS tablet, Take 5 mg by mouth 2 (two) times daily., Disp: , Rfl:  .  atorvastatin (LIPITOR) 20 MG tablet, Take 20 mg by mouth daily., Disp: , Rfl:  .  azithromycin (ZITHROMAX) 250 MG tablet, , Disp: , Rfl:  .  CHANTIX CONTINUING MONTH PAK 1 MG tablet, , Disp: , Rfl:  .  divalproex (DEPAKOTE ER) 500 MG 24 hr tablet, , Disp: , Rfl:  .  divalproex (DEPAKOTE) 500 MG DR tablet, Take 1,000 mg by mouth daily., Disp: , Rfl:  .  ipratropium (ATROVENT) 0.03 % nasal spray, , Disp: , Rfl:  .  losartan-hydrochlorothiazide (HYZAAR) 100-12.5 MG tablet, Take 1 tablet by mouth daily., Disp: , Rfl:  .  metoprolol succinate (TOPROL-XL) 50 MG 24 hr tablet, Take 1 tablet (50 mg total) by mouth daily. Take with or immediately following a meal., Disp: 30 tablet, Rfl: 6 .  predniSONE (DELTASONE) 10 MG tablet, , Disp: , Rfl:    No Known Allergies   Objective: Vitals:   05/26/19 1412  Temp: (!) 96.3 F (35.7 C)    Vascular Examination: Capillary refill time immediate x 10 digits.  Dorsalis pedis pulses palpable b/l.  Posterior tibial pulses palpable b/l.  Sparse digital hair x 10 digits.  Skin temperature gradient WNL b/l.  Dermatological Examination: Skin with normal turgor, texture and tone b/l.  Toenails 1-5 b/l discolored, thick, dystrophic with subungual debris and pain with palpation to nailbeds due to thickness of nails.  Incurvated nailplate b/l great toes with tenderness to palpation. No erythema, no edema, no drainage noted.  Musculoskeletal: Muscle  strength 5/5 to all LE muscle groups b/l.  Neurological: Sensation intact with 10 gram monofilament.  Vibratory sensation intact.  Assessment: Painful onychomycosis toenails 1-5 b/l in patient on blood thinner.  Ingrown toenails b/l hallux, noninfected  Plan: 1. Toenails 1-5 b/l were debrided in length and girth without iatrogenic bleeding. Offending nail borders debrided and curretaged b/l great toes. Borders cleansed with alcohol. Antibiotic ointment applied. Mike Gray instructed to apply triple antibiotic ointment to both great toes once daily for one week. 2. Patient to continue soft, supportive shoe gear daily. 3. Patient to report any pedal injuries to medical professional immediately. 4. Avoid self trimming due to use of blood thinner. 5. Follow up 9 weeks.  6. Patient/POA to call should there be a concern in the interim.

## 2019-05-28 ENCOUNTER — Encounter: Payer: Self-pay | Admitting: Cardiology

## 2019-05-28 NOTE — Progress Notes (Signed)
Remote ICD transmission.   

## 2019-06-16 DIAGNOSIS — R195 Other fecal abnormalities: Secondary | ICD-10-CM | POA: Diagnosis not present

## 2019-06-16 DIAGNOSIS — I480 Paroxysmal atrial fibrillation: Secondary | ICD-10-CM | POA: Diagnosis not present

## 2019-06-16 DIAGNOSIS — I422 Other hypertrophic cardiomyopathy: Secondary | ICD-10-CM | POA: Diagnosis not present

## 2019-06-16 DIAGNOSIS — Z95810 Presence of automatic (implantable) cardiac defibrillator: Secondary | ICD-10-CM | POA: Diagnosis not present

## 2019-06-27 DIAGNOSIS — Z23 Encounter for immunization: Secondary | ICD-10-CM | POA: Diagnosis not present

## 2019-07-10 ENCOUNTER — Encounter: Payer: Self-pay | Admitting: *Deleted

## 2019-07-18 ENCOUNTER — Telehealth: Payer: Self-pay | Admitting: *Deleted

## 2019-07-18 NOTE — Telephone Encounter (Signed)
Called and left a detailed message on the mobile number listed for the patient's brother Doye Geraci asking that the patient give our office a call and ask for the pre op pool and to give an updated contact number for him.

## 2019-07-18 NOTE — Telephone Encounter (Signed)
Patient with diagnosis of afib on Eliquis for anticoagulation.    Procedure: COLONOSCOPY Date of procedure: TBD  CHADS2-VASc score of  4 (CHF, HTN, AGE, AGE)  CrCl 63 ml/min  Per office protocol, patient can hold Eliquis for 2 days prior to procedure.

## 2019-07-18 NOTE — Telephone Encounter (Signed)
   [facility] Pre-operative Risk Assessment    Request for surgical clearance:  1. What type of surgery is being performed? COLONOSCOPY   2. When is this surgery scheduled? TBD   3. What type of clearance is required (medical clearance vs. Pharmacy clearance to hold med vs. Both)? BOTH  4. Are there any medications that need to be held prior to surgery and how long? ELIQUIS X 2 DAYS PRIOR   5. Practice name and name of physician performing surgery? [facility]; DR. Alessandra Bevels   6. What is your office phone number [phone]    7.   What is your office fax number [phone]  8.   Anesthesia type (None, local, MAC, general) ? PROPOFOL    Julaine Hua 07/18/2019, 10:49 AM  _________________________________________________________________   (provider comments below)

## 2019-07-18 NOTE — Telephone Encounter (Signed)
Please verify the patient's home phone number, it is no longer in service. Unable to reach the patient

## 2019-07-21 NOTE — Telephone Encounter (Signed)
Patient returned called, updated phone number on file.

## 2019-07-21 NOTE — Telephone Encounter (Signed)
Follow Up  Patient is returning phone call. Please give a call back.

## 2019-07-21 NOTE — Telephone Encounter (Signed)
Pt called, but was in the car and unable to talk. He will call back with available to talk.

## 2019-07-22 NOTE — Telephone Encounter (Signed)
   Primary Cardiologist: Will [name], MD  Chart reviewed as part of pre-operative protocol coverage. Patient was contacted 07/22/2019 in reference to pre-operative risk assessment for pending surgery as outlined below.  Mike Gray was last seen on 02/11/19 by [name].  Since that day, Mike Gray has done well. Pt can complete 4.0 METS without anginal symptoms. He does not have a history of MI or stroke.   Per our clinical pharmacist: Patient with diagnosis of afib on Eliquis for anticoagulation.    Procedure: COLONOSCOPY Date of procedure: TBD  CHADS2-VASc score of  4 (CHF, HTN, AGE, AGE)  CrCl 63 ml/min  Per office protocol, patient can hold Eliquis for 2 days prior to procedure.  Therefore, based on ACC/AHA guidelines, the patient would be at acceptable risk for the planned procedure without further cardiovascular testing.   I will route this recommendation to the requesting party via Epic fax function and remove from pre-op pool.  Please call with questions.  Tami Lin Duke, PA 07/22/2019, 9:00 AM

## 2019-08-06 ENCOUNTER — Other Ambulatory Visit: Payer: Self-pay

## 2019-08-06 ENCOUNTER — Ambulatory Visit (INDEPENDENT_AMBULATORY_CARE_PROVIDER_SITE_OTHER): Payer: Medicare Other | Admitting: Podiatry

## 2019-08-06 ENCOUNTER — Encounter: Payer: Self-pay | Admitting: Podiatry

## 2019-08-06 DIAGNOSIS — M79674 Pain in right toe(s): Secondary | ICD-10-CM | POA: Diagnosis not present

## 2019-08-06 DIAGNOSIS — M79675 Pain in left toe(s): Secondary | ICD-10-CM | POA: Diagnosis not present

## 2019-08-06 DIAGNOSIS — B351 Tinea unguium: Secondary | ICD-10-CM | POA: Diagnosis not present

## 2019-08-06 NOTE — Patient Instructions (Signed)

## 2019-08-07 ENCOUNTER — Telehealth: Payer: Self-pay

## 2019-08-07 NOTE — Telephone Encounter (Signed)
TRANSMISSION RECEIVED

## 2019-08-07 NOTE — Telephone Encounter (Signed)
Pt is trying to send a manual transmission for the first time. I tried to help him but he received the error code [id number]. I told him let the monitor charge for 30 minutes then we will try again. I told him I will call him back.

## 2019-08-11 NOTE — Progress Notes (Signed)
Subjective:  Mike Gray presents to clinic today with cc of  painful, thick, discolored, elongated toenails 1-5 b/l that become tender and cannot cut because of thickness.  Pain is aggravated when wearing enclosed shoe gear.  Current Outpatient Medications on File Prior to Visit  Medication Sig Dispense Refill  . apixaban (ELIQUIS) 5 MG TABS tablet Take 5 mg by mouth 2 (two) times daily.    [name] Kitchen atorvastatin (LIPITOR) 20 MG tablet Take 20 mg by mouth daily.    [name] Kitchen azithromycin (ZITHROMAX) 250 MG tablet     . CHANTIX CONTINUING MONTH PAK 1 MG tablet     . divalproex (DEPAKOTE ER) 500 MG 24 hr tablet     . divalproex (DEPAKOTE) 500 MG DR tablet Take 1,000 mg by mouth daily.    [name] Kitchen ipratropium (ATROVENT) 0.03 % nasal spray     . losartan-hydrochlorothiazide (HYZAAR) 100-12.5 MG tablet Take 1 tablet by mouth daily.    . metoprolol succinate (TOPROL-XL) 50 MG 24 hr tablet Take 1 tablet (50 mg total) by mouth daily. Take with or immediately following a meal. 30 tablet 6  . predniSONE (DELTASONE) 10 MG tablet      No current facility-administered medications on file prior to visit.      No Known Allergies   Objective: There were no vitals filed for this visit.  Physical Examination:  Vascular Examination: Capillary refill time immediate x 10 digits.  Palpable DP/PT pulses b/l.  Digital hair sparse b/l.  No edema noted b/l.  Skin temperature gradient WNL b/l.  Dermatological Examination: Skin with normal turgor, texture and tone b/l.  No open wounds b/l.  No interdigital macerations noted b/l.  Elongated, thick, discolored brittle toenails with subungual debris and pain on dorsal palpation of nailbeds 1-5 b/l.  Musculoskeletal Examination: Muscle strength 5/5 to all muscle groups b/l.  No pain, crepitus or joint discomfort with active/passive ROM.  Neurological Examination: Sensation intact 5/5 b/l with 10 gram monofilament.  Assessment: Mycotic nail infection with pain  1-5 b/l  Plan: 1. Toenails 1-5 b/l were debrided in length and girth without iatrogenic laceration. 2.  Continue soft, supportive shoe gear daily. 3.  Report any pedal injuries to medical professional. 4.  Follow up 10 weeks. 5.  Patient/POA to call should there be a question/concern in there interim.

## 2019-08-12 ENCOUNTER — Ambulatory Visit (INDEPENDENT_AMBULATORY_CARE_PROVIDER_SITE_OTHER): Payer: Medicare Other | Admitting: *Deleted

## 2019-08-12 DIAGNOSIS — I472 Ventricular tachycardia, unspecified: Secondary | ICD-10-CM

## 2019-08-12 DIAGNOSIS — I421 Obstructive hypertrophic cardiomyopathy: Secondary | ICD-10-CM

## 2019-08-13 LAB — CUP PACEART REMOTE DEVICE CHECK
Battery Remaining Longevity: 28 mo
Battery Voltage: 2.94 V
Brady Statistic AP VP Percent: 76.43 %
Brady Statistic AP VS Percent: 0.24 %
Brady Statistic AS VP Percent: 23 %
Brady Statistic AS VS Percent: 0.33 %
Brady Statistic RA Percent Paced: 75.81 %
Brady Statistic RV Percent Paced: 96.83 %
Date Time Interrogation Session: 20201020083625
HighPow Impedance: 79 Ohm
Implantable Lead Implant Date: 20070328
Implantable Lead Implant Date: 20150323
Implantable Lead Location: 753859
Implantable Lead Location: 753860
Implantable Lead Model: 5076
Implantable Pulse Generator Implant Date: 20150323
Lead Channel Impedance Value: 361 Ohm
Lead Channel Impedance Value: 399 Ohm
Lead Channel Impedance Value: 456 Ohm
Lead Channel Pacing Threshold Amplitude: 0.625 V
Lead Channel Pacing Threshold Amplitude: 0.625 V
Lead Channel Pacing Threshold Pulse Width: 0.4 ms
Lead Channel Pacing Threshold Pulse Width: 0.4 ms
Lead Channel Sensing Intrinsic Amplitude: 1.125 mV
Lead Channel Sensing Intrinsic Amplitude: 1.125 mV
Lead Channel Sensing Intrinsic Amplitude: 8.5 mV
Lead Channel Sensing Intrinsic Amplitude: 8.5 mV
Lead Channel Setting Pacing Amplitude: 2 V
Lead Channel Setting Pacing Amplitude: 2.5 V
Lead Channel Setting Pacing Pulse Width: 0.4 ms
Lead Channel Setting Sensing Sensitivity: 0.3 mV

## 2019-08-25 DIAGNOSIS — Z1159 Encounter for screening for other viral diseases: Secondary | ICD-10-CM | POA: Diagnosis not present

## 2019-08-28 DIAGNOSIS — D123 Benign neoplasm of transverse colon: Secondary | ICD-10-CM | POA: Diagnosis not present

## 2019-08-28 DIAGNOSIS — D124 Benign neoplasm of descending colon: Secondary | ICD-10-CM | POA: Diagnosis not present

## 2019-08-28 DIAGNOSIS — K5730 Diverticulosis of large intestine without perforation or abscess without bleeding: Secondary | ICD-10-CM | POA: Diagnosis not present

## 2019-08-28 DIAGNOSIS — K648 Other hemorrhoids: Secondary | ICD-10-CM | POA: Diagnosis not present

## 2019-08-28 DIAGNOSIS — R195 Other fecal abnormalities: Secondary | ICD-10-CM | POA: Diagnosis not present

## 2019-08-28 DIAGNOSIS — D122 Benign neoplasm of ascending colon: Secondary | ICD-10-CM | POA: Diagnosis not present

## 2019-08-28 NOTE — Progress Notes (Signed)
Remote ICD transmission.   

## 2019-09-02 DIAGNOSIS — D124 Benign neoplasm of descending colon: Secondary | ICD-10-CM | POA: Diagnosis not present

## 2019-09-02 DIAGNOSIS — D123 Benign neoplasm of transverse colon: Secondary | ICD-10-CM | POA: Diagnosis not present

## 2019-09-02 DIAGNOSIS — D122 Benign neoplasm of ascending colon: Secondary | ICD-10-CM | POA: Diagnosis not present

## 2019-09-22 ENCOUNTER — Other Ambulatory Visit: Payer: Self-pay | Admitting: Cardiology

## 2019-10-13 ENCOUNTER — Ambulatory Visit (INDEPENDENT_AMBULATORY_CARE_PROVIDER_SITE_OTHER): Payer: Medicare Other | Admitting: Cardiology

## 2019-10-13 ENCOUNTER — Encounter: Payer: Self-pay | Admitting: Cardiology

## 2019-10-13 ENCOUNTER — Other Ambulatory Visit: Payer: Self-pay

## 2019-10-13 VITALS — BP 116/76 | HR 90 | Wt 199.8 lb

## 2019-10-13 DIAGNOSIS — I472 Ventricular tachycardia, unspecified: Secondary | ICD-10-CM

## 2019-10-13 DIAGNOSIS — Z95810 Presence of automatic (implantable) cardiac defibrillator: Secondary | ICD-10-CM

## 2019-10-13 DIAGNOSIS — I421 Obstructive hypertrophic cardiomyopathy: Secondary | ICD-10-CM

## 2019-10-13 DIAGNOSIS — I480 Paroxysmal atrial fibrillation: Secondary | ICD-10-CM

## 2019-10-13 MED ORDER — APIXABAN 5 MG PO TABS: 5.0000 mg | ORAL_TABLET | Freq: Two times a day (BID) | ORAL | 0 refills | Status: DC

## 2019-10-13 MED ORDER — METOPROLOL SUCCINATE ER 100 MG PO TB24: 100.0000 mg | ORAL_TABLET | Freq: Every day | ORAL | 11 refills | Status: DC

## 2019-10-13 NOTE — Patient Instructions (Signed)
Medication Instructions:  Your physician has recommended you make the following change in your medication:  1. INCREASE Toprol to 100 mg daily  *If you need a refill on your cardiac medications before your next appointment, please call your pharmacy*  Labwork: None ordered  Testing/Procedures: None ordered  Follow-Up: Remote monitoring is used to monitor your Pacemaker or ICD from home. This monitoring reduces the number of office visits required to check your device to one time per year. It allows [place] to keep an eye on the functioning of your device to ensure it is working properly. You are scheduled for a device check from home on 11/11/2019. You may send your transmission at any time that day. If you have a wireless device, the transmission will be sent automatically. After your physician reviews your transmission, you will receive a postcard with your next transmission date.  At [facility], you and your health needs are our priority.  As part of our continuing mission to provide you with exceptional heart care, we have created designated Provider Care Teams.  These Care Teams include your primary Cardiologist (physician) and Advanced Practice Providers (APPs -  Physician Assistants and Nurse Practitioners) who all work together to provide you with the care you need, when you need it.  You will need a follow up appointment in 12 months.  Please call our office 2 months in advance to schedule this appointment.  You may see Dr Curt Bears or one of the following Advanced Practice Providers on your designated Care Team:    Chanetta Marshall, NP  Tommye Standard, PA-C  Oda Kilts, [place]  Thank you for choosing [facility]!!   Trinidad Curet, RN [phone]

## 2019-10-13 NOTE — Progress Notes (Signed)
Electrophysiology Office Note   Date:  10/13/2019   ID:  Levente Gentsch, DOB 04-10-1941, MRN [id number]  PCP:  Constance Haw, MD  Cardiologist:   Primary Electrophysiologist:  Terryn Redner [name], MD    No chief complaint on file.    History of Present Illness: Mike Gray is a 78 y.o. male who is being seen today for the evaluation of ICD/hypertrophic cardiomyopathy at the request of Daijanae Rafalski, Ocie Doyne, MD. Presenting today for electrophysiology evaluation.  He has a history of atrial fibrillation, CHF, COPD, GI bleed, and hypertension.  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  Today, denies symptoms of palpitations, chest pain, shortness of breath, orthopnea, PND, lower extremity edema, claudication, dizziness, presyncope, syncope, bleeding, or neurologic sequela. The patient is tolerating medications without difficulties.     Past Medical History:  Diagnosis Date  . Angiodysplasia   . Atrial fibrillation ([place])   . CHF (congestive heart failure) ([place])   . COPD (chronic obstructive pulmonary disease) ([place])   . GI bleed   . Hypertension    Past Surgical History:  Procedure Laterality Date  . COLONOSCOPY    . PACEMAKER IMPLANT       Current Outpatient Medications  Medication Sig Dispense Refill  . apixaban (ELIQUIS) 5 MG TABS tablet Take 1 tablet (5 mg total) by mouth 2 (two) times daily. 60 tablet 0  . atorvastatin (LIPITOR) 20 MG tablet Take 20 mg by mouth daily.    Hendricks Limes CONTINUING MONTH PAK 1 MG tablet     . divalproex (DEPAKOTE) 500 MG DR tablet Take 1,000 mg by mouth daily.    [name] Kitchen losartan-hydrochlorothiazide (HYZAAR) 100-12.5 MG tablet Take 1 tablet by mouth daily.    . metoprolol succinate (TOPROL-XL) 100 MG 24 hr tablet Take 1 tablet (100 mg total) by mouth daily. Take with or immediately following a meal. 30 tablet 11   No current facility-administered medications  for this visit.    Allergies:   Patient has no known allergies.   Social History:  The patient  reports that he quit smoking about 7 years ago. His smoking use included cigarettes. He has never used smokeless tobacco. He reports previous alcohol use. He reports that he does not use drugs.   Family History:  The patient's family history includes Breast cancer (age of onset: 62) in his father; Cancer in an other family member; Healthy in his mother.    ROS:  Please see the history of present illness.   Otherwise, review of systems is positive for none.   All other systems are reviewed and negative.   PHYSICAL EXAM: VS:  BP 116/76   Pulse 90   Wt 199 lb 12.8 oz (90.6 kg)   SpO2 95%   BMI 27.10 kg/m  , BMI Body mass index is 27.1 kg/m. GEN: Well nourished, well developed, in no acute distress  HEENT: normal  Neck: no JVD, carotid bruits, or masses Cardiac: RRR; no murmurs, rubs, or gallops,no edema  Respiratory:  clear to auscultation bilaterally, normal work of breathing GI: soft, nontender, nondistended, + BS MS: no deformity or atrophy  Skin: warm and dry, device site well healed Neuro:  Strength and sensation are intact Psych: euthymic mood, full affect  EKG:  EKG is ordered today. Personal review of the ekg ordered shows sinus rhythm, ventricular paced, PVC  Personal review of the device interrogation today. Results in  Paceart   Recent Labs: No results found for requested labs within last 8760 hours.    Lipid Panel  No results found for: CHOL, TRIG, HDL, CHOLHDL, VLDL, LDLCALC, LDLDIRECT   Wt Readings from Last 3 Encounters:  10/13/19 199 lb 12.8 oz (90.6 kg)  08/13/18 192 lb (87.1 kg)      Other studies Reviewed: Additional studies/ records that were reviewed today include: Outside records    ASSESSMENT AND PLAN:  1.  Hypertrophic cardiomyopathy:  Status post [place] ICD for nonsustained VT.  Device functioning appropriately.  We Lakasha Mcfall increase Toprol-XL to  100 mg.  2.  Paroxysmal atrial fibrillation: Currently on Eliquis.  Has had short episodes noted on device interrogation.  CHA2DS2-VASc of 3.  Minimal symptoms.  No changes.   3.  Hypertension: Currently well controlled  4.  Nonsustained ventricular tachycardia: None seen on most recent device interrogation.  No changes.  5.  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  Device functioning appropriately.  No changes.  Current medicines are reviewed at length with the patient today.   The patient does not have concerns regarding his medicines.  The following changes were made today: Increase Toprol-XL  Labs/ tests ordered today include:  Orders Placed This Encounter  Procedures  . EKG 12-Lead    Disposition:   FU with Basil Blakesley 12 months  Signed, Freyja Govea [name], MD  10/13/2019 10:13 AM     [facility] [address] [place] [address] [phone] (office) [phone] (fax)

## 2019-10-16 LAB — CUP PACEART INCLINIC DEVICE CHECK
Battery Remaining Longevity: 26 mo
Battery Voltage: 2.93 V
Brady Statistic AP VP Percent: 66.08 %
Brady Statistic AP VS Percent: 0.18 %
Brady Statistic AS VP Percent: 33.36 %
Brady Statistic AS VS Percent: 0.38 %
Brady Statistic RA Percent Paced: 65.61 %
Brady Statistic RV Percent Paced: 98.14 %
Date Time Interrogation Session: 20201221112500
HighPow Impedance: 74 Ohm
Implantable Lead Implant Date: 20070328
Implantable Lead Implant Date: 20150323
Implantable Lead Location: 753859
Implantable Lead Location: 753860
Implantable Lead Model: 5076
Implantable Pulse Generator Implant Date: 20150323
Lead Channel Impedance Value: 342 Ohm
Lead Channel Impedance Value: 418 Ohm
Lead Channel Impedance Value: 513 Ohm
Lead Channel Pacing Threshold Amplitude: 0.5 V
Lead Channel Pacing Threshold Amplitude: 0.75 V
Lead Channel Pacing Threshold Pulse Width: 0.4 ms
Lead Channel Pacing Threshold Pulse Width: 0.4 ms
Lead Channel Sensing Intrinsic Amplitude: 2.125 mV
Lead Channel Setting Pacing Amplitude: 2 V
Lead Channel Setting Pacing Amplitude: 2.5 V
Lead Channel Setting Pacing Pulse Width: 0.4 ms
Lead Channel Setting Sensing Sensitivity: 0.3 mV

## 2019-11-11 ENCOUNTER — Ambulatory Visit (INDEPENDENT_AMBULATORY_CARE_PROVIDER_SITE_OTHER): Payer: Medicare Other | Admitting: *Deleted

## 2019-11-11 DIAGNOSIS — I472 Ventricular tachycardia, unspecified: Secondary | ICD-10-CM

## 2019-11-11 LAB — CUP PACEART REMOTE DEVICE CHECK
Battery Remaining Longevity: 26 mo
Battery Voltage: 2.93 V
Brady Statistic AP VP Percent: 78.38 %
Brady Statistic AP VS Percent: 0.51 %
Brady Statistic AS VP Percent: 20.62 %
Brady Statistic AS VS Percent: 0.49 %
Brady Statistic RA Percent Paced: 78.17 %
Brady Statistic RV Percent Paced: 97.28 %
Date Time Interrogation Session: 20210119022706
HighPow Impedance: 74 Ohm
Implantable Lead Implant Date: 20070328
Implantable Lead Implant Date: 20150323
Implantable Lead Location: 753859
Implantable Lead Location: 753860
Implantable Lead Model: 5076
Implantable Pulse Generator Implant Date: 20150323
Lead Channel Impedance Value: 361 Ohm
Lead Channel Impedance Value: 418 Ohm
Lead Channel Impedance Value: 475 Ohm
Lead Channel Pacing Threshold Amplitude: 0.625 V
Lead Channel Pacing Threshold Amplitude: 0.625 V
Lead Channel Pacing Threshold Pulse Width: 0.4 ms
Lead Channel Pacing Threshold Pulse Width: 0.4 ms
Lead Channel Sensing Intrinsic Amplitude: 2 mV
Lead Channel Sensing Intrinsic Amplitude: 2 mV
Lead Channel Sensing Intrinsic Amplitude: 9.125 mV
Lead Channel Sensing Intrinsic Amplitude: 9.125 mV
Lead Channel Setting Pacing Amplitude: 2 V
Lead Channel Setting Pacing Amplitude: 2.5 V
Lead Channel Setting Pacing Pulse Width: 0.4 ms
Lead Channel Setting Sensing Sensitivity: 0.3 mV

## 2019-11-12 ENCOUNTER — Ambulatory Visit (INDEPENDENT_AMBULATORY_CARE_PROVIDER_SITE_OTHER): Payer: Medicare Other | Admitting: Podiatry

## 2019-11-12 ENCOUNTER — Other Ambulatory Visit: Payer: Self-pay

## 2019-11-12 ENCOUNTER — Encounter: Payer: Self-pay | Admitting: Podiatry

## 2019-11-12 DIAGNOSIS — L03032 Cellulitis of left toe: Secondary | ICD-10-CM

## 2019-11-12 DIAGNOSIS — M79674 Pain in right toe(s): Secondary | ICD-10-CM | POA: Diagnosis not present

## 2019-11-12 DIAGNOSIS — B351 Tinea unguium: Secondary | ICD-10-CM | POA: Diagnosis not present

## 2019-11-12 DIAGNOSIS — L600 Ingrowing nail: Secondary | ICD-10-CM

## 2019-11-12 DIAGNOSIS — M79675 Pain in left toe(s): Secondary | ICD-10-CM | POA: Diagnosis not present

## 2019-11-12 NOTE — Patient Instructions (Addendum)
EPSOM SALT FOOT SOAK INSTRUCTIONS  1.  Place 1/4 cup of epsom salts in 2 quarts of warm tap water. IF YOU ARE DIABETIC, OR HAVE NEUROPATHY,  CHECK THE TEMPERATURE OF THE WATER WITH YOUR ELBOW.  2.  Submerge your foot/feet in the solution and soak for 10 to 15 minutes.      3.  Next, remove your foot or feet from solution, blot dry the affected area.    4.  Apply antibiotic ointment and cover with fabric band-aid .  5.  This soak should be done once a day for 7 days.   6.  Monitor for any signs/symptoms of infection such as redness, swelling, odor, drainage, increased pain, or non-healing of digit.   7.  Please do not hesitate to call the office and speak to a Nurse or Doctor if you have questions.   8.  If you experience fever, chills, nightsweats, nausea or vomiting with worsening of digit, please go to the emergency room.

## 2019-11-16 NOTE — Progress Notes (Signed)
Subjective: Mike Gray presents today for follow up of painful mycotic nails b/l that are difficult to trim. Pain interferes with ambulation. Aggravating factors include wearing enclosed shoe gear. Pain is relieved with periodic professional debridement and ingrown toenail to the left, hallux.   No Known Allergies   Objective: There were no vitals filed for this visit.  Vascular Examination:  capillary refill time to digits immediate b/l, palpable DP pulses b/l, palpable PT pulses b/l, pedal hair sparse b/l and skin temperature gradient within normal limits b/l  Dermatological Examination: Pedal skin with normal turgor, texture and tone bilaterally, no open wounds bilaterally, no interdigital macerations bilaterally and toenails 1-5 b/l elongated, dystrophic, thickened, crumbly with subungual debris.  Incurvated nailplate left great toe lateral border(s) with tenderness to palpation. Signs of early paronychia noted. +Nail border hypertrophy. No purulence. No erythema.  Musculoskeletal: normal muscle strength 5/5 to all lower extremity muscle groups bilaterally, no gross bony deformities bilaterally and no pain crepitus or joint limitation noted with ROM b/l  Neurological: sensation intact 5/5 intact bilaterally with 10g monofilament and vibratory sensation intact  Assessment: No diagnosis found.   Plan: -Toenails 1-5 b/l were debrided in length and girth without iatrogenic bleeding.  -Slantback procedure performed to offending nail border debrided and curretaged left hallux. Border cleansed with alcohol and triple antibiotic applied. No further treatment required by Patient given written instructions for epsom salt soaks once daily for one week. Call office if condition does not resolve. -Patient to continue soft, supportive shoe gear daily. -Patient to report any pedal injuries to medical professional immediately. -Patient/POA to call should there be question/concern in the  interim.  Return in about 3 months (around 02/10/2020) for nail trim/ Eliquis.

## 2019-11-22 ENCOUNTER — Ambulatory Visit: Payer: Medicare Other

## 2019-11-29 ENCOUNTER — Ambulatory Visit: Payer: Medicare Other | Attending: Internal Medicine

## 2019-11-29 DIAGNOSIS — Z23 Encounter for immunization: Secondary | ICD-10-CM | POA: Insufficient documentation

## 2019-11-29 NOTE — Progress Notes (Signed)
   Covid-19 Vaccination Clinic  Name:  Mike Gray    MRN: [id number] DOB: [birth year]  11/29/2019  Mike Gray was observed post Covid-19 immunization for 15 minutes without incidence. He was provided with Vaccine Information Sheet and instruction to access the V-Safe system.   Mike Gray was instructed to call 911 with any severe reactions post vaccine: [name] Kitchen Difficulty breathing  . Swelling of your face and throat  . A fast heartbeat  . A bad rash all over your body  . Dizziness and weakness    Immunizations Administered    Name Date Dose VIS Date Route   Pfizer COVID-19 Vaccine 11/29/2019  5:50 PM 0.3 mL 10/03/2019 Intramuscular   Manufacturer: [place]   Lot: [id number]   [place]: [id number]

## 2019-12-17 DIAGNOSIS — F319 Bipolar disorder, unspecified: Secondary | ICD-10-CM | POA: Diagnosis not present

## 2019-12-17 DIAGNOSIS — Z95810 Presence of automatic (implantable) cardiac defibrillator: Secondary | ICD-10-CM | POA: Diagnosis not present

## 2019-12-17 DIAGNOSIS — E7800 Pure hypercholesterolemia, unspecified: Secondary | ICD-10-CM | POA: Diagnosis not present

## 2019-12-17 DIAGNOSIS — I480 Paroxysmal atrial fibrillation: Secondary | ICD-10-CM | POA: Diagnosis not present

## 2019-12-17 DIAGNOSIS — I422 Other hypertrophic cardiomyopathy: Secondary | ICD-10-CM | POA: Diagnosis not present

## 2019-12-17 DIAGNOSIS — I5022 Chronic systolic (congestive) heart failure: Secondary | ICD-10-CM | POA: Diagnosis not present

## 2019-12-17 DIAGNOSIS — Z0001 Encounter for general adult medical examination with abnormal findings: Secondary | ICD-10-CM | POA: Diagnosis not present

## 2019-12-24 ENCOUNTER — Ambulatory Visit: Payer: Medicare Other | Attending: Internal Medicine

## 2019-12-24 DIAGNOSIS — Z23 Encounter for immunization: Secondary | ICD-10-CM

## 2019-12-24 NOTE — Progress Notes (Signed)
   Covid-19 Vaccination Clinic  Name:  Mike Gray    MRN: [id number] DOB: [birth year]  12/24/2019  [name] was observed post Covid-19 immunization for 15 minutes without incident. He was provided with Vaccine Information Sheet and instruction to access the V-Safe system.   [name] was instructed to call 911 with any severe reactions post vaccine: [name] Kitchen Difficulty breathing  . Swelling of face and throat  . A fast heartbeat  . A bad rash all over body  . Dizziness and weakness   Immunizations Administered    Name Date Dose VIS Date Route   Pfizer COVID-19 Vaccine 12/24/2019  2:48 PM 0.3 mL 10/03/2019 Intramuscular   Manufacturer: [place]   Lot: [id number]   [place]: [id number]

## 2020-02-10 ENCOUNTER — Ambulatory Visit (INDEPENDENT_AMBULATORY_CARE_PROVIDER_SITE_OTHER): Payer: Medicare Other | Admitting: *Deleted

## 2020-02-10 DIAGNOSIS — I472 Ventricular tachycardia, unspecified: Secondary | ICD-10-CM

## 2020-02-10 LAB — CUP PACEART REMOTE DEVICE CHECK
Battery Remaining Longevity: 25 mo
Battery Voltage: 2.93 V
Brady Statistic AP VP Percent: 71 %
Brady Statistic AP VS Percent: 0.3 %
Brady Statistic AS VP Percent: 28.22 %
Brady Statistic AS VS Percent: 0.49 %
Brady Statistic RA Percent Paced: 70.6 %
Brady Statistic RV Percent Paced: 97.93 %
Date Time Interrogation Session: 20210420044223
HighPow Impedance: 80 Ohm
Implantable Lead Implant Date: 20070328
Implantable Lead Implant Date: 20150323
Implantable Lead Location: 753859
Implantable Lead Location: 753860
Implantable Lead Model: 5076
Implantable Pulse Generator Implant Date: 20150323
Lead Channel Impedance Value: 342 Ohm
Lead Channel Impedance Value: 456 Ohm
Lead Channel Impedance Value: 475 Ohm
Lead Channel Pacing Threshold Amplitude: 0.625 V
Lead Channel Pacing Threshold Amplitude: 0.625 V
Lead Channel Pacing Threshold Pulse Width: 0.4 ms
Lead Channel Pacing Threshold Pulse Width: 0.4 ms
Lead Channel Sensing Intrinsic Amplitude: 2.125 mV
Lead Channel Sensing Intrinsic Amplitude: 2.125 mV
Lead Channel Sensing Intrinsic Amplitude: 9.875 mV
Lead Channel Sensing Intrinsic Amplitude: 9.875 mV
Lead Channel Setting Pacing Amplitude: 2 V
Lead Channel Setting Pacing Amplitude: 2.5 V
Lead Channel Setting Pacing Pulse Width: 0.4 ms
Lead Channel Setting Sensing Sensitivity: 0.3 mV

## 2020-02-11 ENCOUNTER — Ambulatory Visit (INDEPENDENT_AMBULATORY_CARE_PROVIDER_SITE_OTHER): Payer: Medicare Other | Admitting: Podiatry

## 2020-02-11 ENCOUNTER — Encounter: Payer: Self-pay | Admitting: Podiatry

## 2020-02-11 ENCOUNTER — Other Ambulatory Visit: Payer: Self-pay

## 2020-02-11 VITALS — Temp 97.2°F

## 2020-02-11 DIAGNOSIS — M79674 Pain in right toe(s): Secondary | ICD-10-CM

## 2020-02-11 DIAGNOSIS — L600 Ingrowing nail: Secondary | ICD-10-CM

## 2020-02-11 DIAGNOSIS — B351 Tinea unguium: Secondary | ICD-10-CM

## 2020-02-11 DIAGNOSIS — M79675 Pain in left toe(s): Secondary | ICD-10-CM | POA: Diagnosis not present

## 2020-02-11 NOTE — Patient Instructions (Addendum)
EPSOM SALT FOOT SOAK INSTRUCTIONS  Shopping List:  A. Plain epsom salt (not scented) B. Neosporin Cream/Ointment or Bacitracin Cream/Ointment C. 1-inch fabric band-aids   1.  Place 1/4 cup of epsom salts in 2 quarts of warm tap water. IF YOU ARE DIABETIC, OR HAVE NEUROPATHY, CHECK THE TEMPERATURE OF THE WATER WITH YOUR ELBOW.  2.  Submerge your foot/feet in the solution and soak for 10-15 minutes.      3.  Next, remove your foot or feet from solution, blot dry the affected area.    4.  Apply antibiotic ointment and cover with fabric band-aid .  5.  This soak should be done once a day for 7 days.   6.  Monitor for any signs/symptoms of infection such as redness, swelling, odor, drainage, increased pain, or non-healing of digit.   7.  Please do not hesitate to call the office and speak to a Nurse or Doctor if you have questions.   8.  If you experience fever, chills, nightsweats, nausea or vomiting with worsening of digit, please go to the emergency room.    Ingrown Toenail An ingrown toenail occurs when the corner or sides of a toenail grow into the surrounding skin. This causes discomfort and pain. The big toe is most commonly affected, but any of the toes can be affected. If an ingrown toenail is not treated, it can become infected. What are the causes? This condition may be caused by:  Wearing shoes that are too small or tight.  An injury, such as stubbing your toe or having your toe stepped on.  Improper cutting or care of your toenails.  Having nail or foot abnormalities that were present from birth (congenital abnormalities), such as having a nail that is too big for your toe. What increases the risk? The following factors may make you more likely to develop ingrown toenails:  Age. Nails tend to get thicker with age, so ingrown nails are more common among older people.  Cutting your toenails incorrectly, such as cutting them very short or cutting them unevenly. An  ingrown toenail is more likely to get infected if you have:  Diabetes.  Blood flow (circulation) problems. What are the signs or symptoms? Symptoms of an ingrown toenail may include:  Pain, soreness, or tenderness.  Redness.  Swelling.  Hardening of the skin that surrounds the toenail. Signs that an ingrown toenail may be infected include:  Fluid or pus.  Symptoms that get worse instead of better. How is this diagnosed? An ingrown toenail may be diagnosed based on your medical history, your symptoms, and a physical exam. If you have fluid or blood coming from your toenail, a sample may be collected to test for the specific type of bacteria that is causing the infection. How is this treated? Treatment depends on how severe your ingrown toenail is. You may be able to care for your toenail at home.  If you have an infection, you may be prescribed antibiotic medicines.  If you have fluid or pus draining from your toenail, your health care provider may drain it.  If you have trouble walking, you may be given crutches to use.  If you have a severe or infected ingrown toenail, you may need a procedure to remove part or all of the nail. Follow these instructions at home: Foot care   Do not pick at your toenail or try to remove it yourself.  Soak your foot in warm, soapy water. Do this for 20  minutes, 3 times a day, or as often as told by your health care provider. This helps to keep your toe clean and keep your skin soft.  Wear shoes that fit well and are not too tight. Your health care provider may recommend that you wear open-toed shoes while you heal.  Trim your toenails regularly and carefully. Cut your toenails straight across to prevent injury to the skin at the corners of the toenail. Do not cut your nails in a curved shape.  Keep your feet clean and dry to help prevent infection. Medicines  Take over-the-counter and prescription medicines only as told by your health  care provider.  If you were prescribed an antibiotic, take it as told by your health care provider. Do not stop taking the antibiotic even if you start to feel better. Activity  Return to your normal activities as told by your health care provider. Ask your health care provider what activities are safe for you.  Avoid activities that cause pain. General instructions  If your health care provider told you to use crutches to help you move around, use them as instructed.  Keep all follow-up visits as told by your health care provider. This is important. Contact a health care provider if:  You have more redness, swelling, pain, or other symptoms that do not improve with treatment.  You have fluid, blood, or pus coming from your toenail. Get help right away if:  You have a red streak on your skin that starts at your foot and spreads up your leg.  You have a fever. Summary  An ingrown toenail occurs when the corner or sides of a toenail grow into the surrounding skin. This causes discomfort and pain. The big toe is most commonly affected, but any of the toes can be affected.  If an ingrown toenail is not treated, it can become infected.  Fluid or pus draining from your toenail is a sign of infection. Your health care provider may need to drain it. You may be given antibiotics to treat the infection.  Trimming your toenails regularly and properly can help you prevent an ingrown toenail. This information is not intended to replace advice given to you by your health care provider. Make sure you discuss any questions you have with your health care provider. Document Revised: 01/31/2019 Document Reviewed: 06/27/2017 Elsevier Patient Education  [place].

## 2020-02-11 NOTE — Progress Notes (Signed)
ICD Remote  

## 2020-02-15 NOTE — Progress Notes (Signed)
Subjective: Mike Gray presents today for follow up of painful chronic ingrown toenails b/l great toes and mycotic nails b/l that are difficult to trim. Pain interferes with ambulation. Aggravating factors include wearing enclosed shoe gear. Pain is relieved with periodic professional debridement.   He voices no new pedal concerns on today's visit.  No Known Allergies   Objective: Vitals:   02/11/20 1619  Temp: (!) 97.2 F (36.2 C)    Pt is a pleasant 79 y.o. year old Caucasian male  in NAD. AAO x 3.   Vascular Examination:  Capillary refill time to digits immediate b/l. Palpable DP pulses b/l. Palpable PT pulses b/l. Pedal hair sparse b/l. Skin temperature gradient within normal limits b/l.  Dermatological Examination: Pedal skin with normal turgor, texture and tone bilaterally. No open wounds bilaterally. No interdigital macerations bilaterally. Toenails 1-5 b/l elongated, dystrophic, thickened, crumbly with subungual debris and tenderness to dorsal palpation.  Musculoskeletal: Normal muscle strength 5/5 to all lower extremity muscle groups bilaterally. No gross bony deformities bilaterally. No pain crepitus or joint limitation noted with ROM b/l. Patient ambulates independent of any assistive aids.  Neurological: Protective sensation intact 5/5 intact bilaterally with 10g monofilament b/l. Vibratory sensation intact b/l. Proprioception intact bilaterally. Babinski reflex negative b/l. Clonus negative b/l.  Assessment: 1. Pain due to onychomycosis of toenails of both feet   2. Ingrown toenail   3. Pain of left great toe   4. Pain of right great toe    Plan: -Toenails 1-5 b/l were debrided in length and girth with sterile nail nippers and dremel without iatrogenic bleeding.  -Offending nail borders debrided and curretaged b/l great toes. Borders cleansed with alcohol. Antibiotic ointment applied. Written instructions for once daily epsom salt/warm water soaks  dispensed. -Patient to continue soft, supportive shoe gear daily. -Patient to report any pedal injuries to medical professional immediately. -Patient/POA to call should there be question/concern in the interim.  Return in about 3 months (around 05/12/2020) for nail trim/ Eliquis.

## 2020-02-27 ENCOUNTER — Telehealth: Payer: Self-pay | Admitting: *Deleted

## 2020-02-27 MED ORDER — METOPROLOL SUCCINATE ER 200 MG PO TB24: 200.0000 mg | ORAL_TABLET | Freq: Every day | ORAL | 6 refills | Status: DC

## 2020-02-27 NOTE — Telephone Encounter (Signed)
-----   Message from Will [name], MD sent at 02/10/2020 10:55 AM EDT ----- Abnormal device interrogation reviewed.  Lead parameters and battery status stable.  Multiple NSVT episodes. Increase toprol XL to 200 mg.

## 2020-02-27 NOTE — Telephone Encounter (Signed)
Pt advised and agreeable to plan. Advised to call if issues arise after starting increased dose. Patient verbalized understanding and agreeable to plan.

## 2020-03-31 ENCOUNTER — Telehealth: Payer: Self-pay | Admitting: Cardiology

## 2020-03-31 NOTE — Telephone Encounter (Signed)
lmtcb

## 2020-03-31 NOTE — Telephone Encounter (Signed)
New Message  Pt called and said that [name] got him taking metoprolol succinate (TOPROL-XL) 200 MG 24 hr tablet and it makes him extremely dizzy from the moment he takes it.

## 2020-04-05 DIAGNOSIS — M76821 Posterior tibial tendinitis, right leg: Secondary | ICD-10-CM | POA: Diagnosis not present

## 2020-04-05 DIAGNOSIS — M19071 Primary osteoarthritis, right ankle and foot: Secondary | ICD-10-CM | POA: Diagnosis not present

## 2020-04-08 NOTE — Telephone Encounter (Signed)
Left message to call back  

## 2020-04-08 NOTE — Telephone Encounter (Signed)
Mike Gray is returning [employer]. He states he had 50 MG tablets left over from his previous dosage and has been taking 2 tablets daily for the past 2-3 days instead. Please advise.

## 2020-04-08 NOTE — Telephone Encounter (Signed)
I spoke to the patient who was taking Metoprolol Succinate 200 mg Daily and it made him feel "extremely dizzy".    He started taking two 50 mg tablets daily a few days ago and he feels much better.  He will monitor his BP/HR (symptoms) over the next few days and report back to [place] on Monday.

## 2020-04-15 NOTE — Telephone Encounter (Signed)
Lmtcb.

## 2020-04-20 NOTE — Telephone Encounter (Signed)
lmtcb

## 2020-05-11 ENCOUNTER — Ambulatory Visit (INDEPENDENT_AMBULATORY_CARE_PROVIDER_SITE_OTHER): Payer: Medicare Other | Admitting: *Deleted

## 2020-05-11 ENCOUNTER — Telehealth: Payer: Self-pay

## 2020-05-11 DIAGNOSIS — I421 Obstructive hypertrophic cardiomyopathy: Secondary | ICD-10-CM

## 2020-05-11 LAB — CUP PACEART REMOTE DEVICE CHECK
Battery Remaining Longevity: 23 mo
Battery Voltage: 2.91 V
Brady Statistic AP VP Percent: 68.62 %
Brady Statistic AP VS Percent: 0.14 %
Brady Statistic AS VP Percent: 30.87 %
Brady Statistic AS VS Percent: 0.38 %
Brady Statistic RA Percent Paced: 68.03 %
Brady Statistic RV Percent Paced: 98.7 %
Date Time Interrogation Session: 20210720033424
HighPow Impedance: 84 Ohm
Implantable Lead Implant Date: 20070328
Implantable Lead Implant Date: 20150323
Implantable Lead Location: 753859
Implantable Lead Location: 753860
Implantable Lead Model: 5076
Implantable Pulse Generator Implant Date: 20150323
Lead Channel Impedance Value: 361 Ohm
Lead Channel Impedance Value: 418 Ohm
Lead Channel Impedance Value: 475 Ohm
Lead Channel Pacing Threshold Amplitude: 0.625 V
Lead Channel Pacing Threshold Amplitude: 0.625 V
Lead Channel Pacing Threshold Pulse Width: 0.4 ms
Lead Channel Pacing Threshold Pulse Width: 0.4 ms
Lead Channel Sensing Intrinsic Amplitude: 1.75 mV
Lead Channel Sensing Intrinsic Amplitude: 1.75 mV
Lead Channel Sensing Intrinsic Amplitude: 8.375 mV
Lead Channel Sensing Intrinsic Amplitude: 8.375 mV
Lead Channel Setting Pacing Amplitude: 2 V
Lead Channel Setting Pacing Amplitude: 2.5 V
Lead Channel Setting Pacing Pulse Width: 0.4 ms
Lead Channel Setting Sensing Sensitivity: 0.3 mV

## 2020-05-11 NOTE — Telephone Encounter (Signed)
Scheduled remote transmission received-  Presenting rhythm AFlutter with Vs/Vp, persistent since 04/30/20 (controlled rates). 43 NSVT, available EGM's show duration 5-26 beats and no ventricular episodes since onset of AFlutter.  [place]- Eliquis.   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  Attempted to reach pt to determine medication compliance and if symptomatic.  There was a change in Metoprolol dosage from 200mg  to 100mg  in early June due to dizziness.    Pt not due to see [name] until December 2021.

## 2020-05-12 NOTE — Telephone Encounter (Signed)
Second attempt to reach patient; no answer, left VM with DC phone # to return call.

## 2020-05-13 NOTE — Progress Notes (Signed)
Remote ICD transmission.   

## 2020-05-17 ENCOUNTER — Telehealth: Payer: Self-pay | Admitting: *Deleted

## 2020-05-17 NOTE — Telephone Encounter (Signed)
lmtcb

## 2020-05-17 NOTE — Telephone Encounter (Signed)
-----   Message from Will [name], MD sent at 05/12/2020  9:23 AM EDT ----- Abnormal device interrogation reviewed.  Lead parameters and battery status stable.  Persistent atrial flutter and NSVT. Needs clinic follow up with EP app/AF clinic or me to discuss.

## 2020-05-18 ENCOUNTER — Other Ambulatory Visit: Payer: Self-pay

## 2020-05-18 ENCOUNTER — Ambulatory Visit (INDEPENDENT_AMBULATORY_CARE_PROVIDER_SITE_OTHER): Payer: Medicare Other | Admitting: Podiatry

## 2020-05-18 ENCOUNTER — Encounter: Payer: Self-pay | Admitting: Podiatry

## 2020-05-18 DIAGNOSIS — B351 Tinea unguium: Secondary | ICD-10-CM

## 2020-05-18 DIAGNOSIS — M79675 Pain in left toe(s): Secondary | ICD-10-CM

## 2020-05-18 DIAGNOSIS — M79674 Pain in right toe(s): Secondary | ICD-10-CM

## 2020-05-21 NOTE — Progress Notes (Signed)
Subjective:  Patient ID: Mike Gray, male    DOB: 12-30-1940,  MRN: [id number]  Mike Gray presents to clinic today for painful thick toenails that are difficult to trim. Pain interferes with ambulation. Aggravating factors include wearing enclosed shoe gear. Pain is relieved with periodic professional debridement..  79 y.o. male presents with the above complaint.  Reports painfully elongated nails to both feet.  Review of Systems: Negative except as noted in the HPI. Past Medical History:  Diagnosis Date  . Angiodysplasia   . Atrial fibrillation ([place])   . CHF (congestive heart failure) ([place])   . COPD (chronic obstructive pulmonary disease) ([place])   . GI bleed   . Hypertension    Past Surgical History:  Procedure Laterality Date  . COLONOSCOPY    . PACEMAKER IMPLANT      Current Outpatient Medications:  .  apixaban (ELIQUIS) 5 MG TABS tablet, Take 1 tablet (5 mg total) by mouth 2 (two) times daily., Disp: 60 tablet, Rfl: 0 .  atorvastatin (LIPITOR) 20 MG tablet, Take 20 mg by mouth daily., Disp: , Rfl:  .  CHANTIX CONTINUING MONTH PAK 1 MG tablet, , Disp: , Rfl:  .  divalproex (DEPAKOTE ER) 500 MG 24 hr tablet, Take 1,000 mg by mouth at bedtime., Disp: , Rfl:  .  divalproex (DEPAKOTE) 500 MG DR tablet, Take 1,000 mg by mouth daily., Disp: , Rfl:  .  losartan-hydrochlorothiazide (HYZAAR) 100-12.5 MG tablet, Take 1 tablet by mouth daily., Disp: , Rfl:  .  metoprolol succinate (TOPROL-XL) 200 MG 24 hr tablet, Take 1 tablet (200 mg total) by mouth daily. Take with or immediately following a meal., Disp: 30 tablet, Rfl: 6 No Known Allergies Social History   Occupational History  . Not on file  Tobacco Use  . Smoking status: Former Smoker    Types: Cigarettes    Quit date: 2013    Years since quitting: 8.5  . Smokeless tobacco: Never Used  Vaping Use  . Vaping Use: Never used  Substance and Sexual Activity  . Alcohol use: Not Currently  . Drug use: Never  . Sexual  activity: Not on file    Objective:   Constitutional Mike Gray is a pleasant 79 y.o. Caucasian male, WD, WN in NAD.[name] Kitchen AAO x 3.   Vascular Dorsalis pedis pulses palpable bilaterally. Posterior tibial pulses palpable bilaterally. Capillary refill normal to all digits.  No cyanosis or clubbing noted. Pedal hair growth sparse b/l. Lower extremity skin temperature gradient within normal limits.  Neurologic Normal speech. Oriented to person, place, and time. Epicritic sensation to light touch grossly present bilaterally. Protective sensation intact 5/5 intact bilaterally with 10g monofilament b/l. Vibratory sensation intact b/l. Proprioception intact bilaterally.  Dermatologic Pedal skin with normal turgor, texture and tone b/l.  Toenails are discolored, thick, elongated, dystrophic with pain on palpation x 10 No open wounds. No skin lesions. Pedal skin with normal turgor, texture and tone bilaterally. No open wounds bilaterally. No interdigital macerations bilaterally. Toenails 1-5 b/l elongated, discolored, dystrophic, thickened, crumbly with subungual debris and tenderness to dorsal palpation.  Orthopedic: Normal muscle strength 5/5 to all lower extremity muscle groups bilaterally. No pain crepitus or joint limitation noted with ROM b/l. No gross bony deformities bilaterally. Patient ambulates independent of any assistive aids. No bony tenderness.    Radiographs: None Assessment:  No diagnosis found. Plan:  Patient was evaluated and treated and all questions answered.  Onychomycosis with pain -Nails palliatively debridement as below -Educated on self-care  Procedure: Nail Debridement Rationale: Pain Type of Debridement: manual, sharp debridement. Instrumentation: Nail nipper, rotary burr. Number of Nails: 10 -Examined patient. -Toenails 1-5 b/l were debrided in length and girth with sterile nail nippers and dremel. Offending nail borders debrided and curretaged b/l great toes.  Borders cleansed with alcohol. Antibiotic ointment applied. No further treatment required by patient. -Iatrogenic laceration sustained during R 2nd toe.  Treated with Lumicain Hemostatic Solution and alcohol. Patient instructed to apply Neosporin to R 2nd toe once daily for 7 days. Call if he has any problems. -Patient to report any pedal injuries to medical professional immediately. -Patient to continue soft, supportive shoe gear daily. -Patient/POA to call should there be question/concern in the interim.  Return in about 3 months (around 08/18/2020).  Marzetta Board, DPM

## 2020-05-27 NOTE — Telephone Encounter (Signed)
Third attempt to reach patient. Called to reassess. No answer. LMOVM.

## 2020-05-27 NOTE — Telephone Encounter (Signed)
See phone encounter from [place], [place] on 05/17/20.

## 2020-06-04 ENCOUNTER — Encounter: Payer: Self-pay | Admitting: Cardiology

## 2020-06-09 NOTE — Telephone Encounter (Signed)
(  I have been out of the office unexpectedly for several weeks) lmtcb

## 2020-06-21 NOTE — Telephone Encounter (Signed)
Pt seeing Oda Kilts, [place] on 9/15

## 2020-06-22 DIAGNOSIS — I5022 Chronic systolic (congestive) heart failure: Secondary | ICD-10-CM | POA: Diagnosis not present

## 2020-06-22 DIAGNOSIS — I422 Other hypertrophic cardiomyopathy: Secondary | ICD-10-CM | POA: Diagnosis not present

## 2020-06-22 DIAGNOSIS — I480 Paroxysmal atrial fibrillation: Secondary | ICD-10-CM | POA: Diagnosis not present

## 2020-06-22 DIAGNOSIS — J300 Vasomotor rhinitis: Secondary | ICD-10-CM | POA: Diagnosis not present

## 2020-06-22 DIAGNOSIS — E7800 Pure hypercholesterolemia, unspecified: Secondary | ICD-10-CM | POA: Diagnosis not present

## 2020-06-22 DIAGNOSIS — Z23 Encounter for immunization: Secondary | ICD-10-CM | POA: Diagnosis not present

## 2020-07-04 ENCOUNTER — Emergency Department (HOSPITAL_COMMUNITY): Payer: Medicare Other

## 2020-07-04 ENCOUNTER — Other Ambulatory Visit: Payer: Self-pay

## 2020-07-04 ENCOUNTER — Encounter (HOSPITAL_COMMUNITY): Payer: Self-pay

## 2020-07-04 ENCOUNTER — Inpatient Hospital Stay (HOSPITAL_COMMUNITY)
Admission: EM | Admit: 2020-07-04 | Discharge: 2020-07-09 | DRG: 291 | Disposition: A | Discharge status: Home Health | Payer: Medicare Other | Attending: Family Medicine | Admitting: Family Medicine

## 2020-07-04 DIAGNOSIS — R410 Disorientation, unspecified: Secondary | ICD-10-CM | POA: Diagnosis not present

## 2020-07-04 DIAGNOSIS — I4819 Other persistent atrial fibrillation: Secondary | ICD-10-CM | POA: Diagnosis present

## 2020-07-04 DIAGNOSIS — I493 Ventricular premature depolarization: Secondary | ICD-10-CM | POA: Diagnosis present

## 2020-07-04 DIAGNOSIS — J441 Chronic obstructive pulmonary disease with (acute) exacerbation: Secondary | ICD-10-CM

## 2020-07-04 DIAGNOSIS — R32 Unspecified urinary incontinence: Secondary | ICD-10-CM | POA: Diagnosis present

## 2020-07-04 DIAGNOSIS — J9602 Acute respiratory failure with hypercapnia: Secondary | ICD-10-CM | POA: Diagnosis not present

## 2020-07-04 DIAGNOSIS — N400 Enlarged prostate without lower urinary tract symptoms: Secondary | ICD-10-CM | POA: Diagnosis not present

## 2020-07-04 DIAGNOSIS — I442 Atrioventricular block, complete: Secondary | ICD-10-CM | POA: Diagnosis not present

## 2020-07-04 DIAGNOSIS — K5790 Diverticulosis of intestine, part unspecified, without perforation or abscess without bleeding: Secondary | ICD-10-CM | POA: Diagnosis not present

## 2020-07-04 DIAGNOSIS — I472 Ventricular tachycardia: Secondary | ICD-10-CM | POA: Diagnosis not present

## 2020-07-04 DIAGNOSIS — G9341 Metabolic encephalopathy: Secondary | ICD-10-CM | POA: Diagnosis not present

## 2020-07-04 DIAGNOSIS — N401 Enlarged prostate with lower urinary tract symptoms: Secondary | ICD-10-CM | POA: Diagnosis present

## 2020-07-04 DIAGNOSIS — J9622 Acute and chronic respiratory failure with hypercapnia: Secondary | ICD-10-CM

## 2020-07-04 DIAGNOSIS — I422 Other hypertrophic cardiomyopathy: Secondary | ICD-10-CM

## 2020-07-04 DIAGNOSIS — J189 Pneumonia, unspecified organism: Secondary | ICD-10-CM

## 2020-07-04 DIAGNOSIS — N281 Cyst of kidney, acquired: Secondary | ICD-10-CM | POA: Diagnosis not present

## 2020-07-04 DIAGNOSIS — J9601 Acute respiratory failure with hypoxia: Secondary | ICD-10-CM | POA: Diagnosis not present

## 2020-07-04 DIAGNOSIS — K8020 Calculus of gallbladder without cholecystitis without obstruction: Secondary | ICD-10-CM | POA: Diagnosis not present

## 2020-07-04 DIAGNOSIS — I2510 Atherosclerotic heart disease of native coronary artery without angina pectoris: Secondary | ICD-10-CM | POA: Diagnosis not present

## 2020-07-04 DIAGNOSIS — Z7901 Long term (current) use of anticoagulants: Secondary | ICD-10-CM | POA: Diagnosis not present

## 2020-07-04 DIAGNOSIS — Z95810 Presence of automatic (implantable) cardiac defibrillator: Secondary | ICD-10-CM

## 2020-07-04 DIAGNOSIS — Z8679 Personal history of other diseases of the circulatory system: Secondary | ICD-10-CM | POA: Diagnosis not present

## 2020-07-04 DIAGNOSIS — R5383 Other fatigue: Secondary | ICD-10-CM | POA: Diagnosis not present

## 2020-07-04 DIAGNOSIS — R159 Full incontinence of feces: Secondary | ICD-10-CM | POA: Diagnosis present

## 2020-07-04 DIAGNOSIS — E872 Acidosis: Secondary | ICD-10-CM | POA: Diagnosis present

## 2020-07-04 DIAGNOSIS — I110 Hypertensive heart disease with heart failure: Secondary | ICD-10-CM | POA: Diagnosis not present

## 2020-07-04 DIAGNOSIS — Z87891 Personal history of nicotine dependence: Secondary | ICD-10-CM

## 2020-07-04 DIAGNOSIS — R0602 Shortness of breath: Secondary | ICD-10-CM | POA: Diagnosis not present

## 2020-07-04 DIAGNOSIS — F319 Bipolar disorder, unspecified: Secondary | ICD-10-CM | POA: Diagnosis not present

## 2020-07-04 DIAGNOSIS — I5023 Acute on chronic systolic (congestive) heart failure: Secondary | ICD-10-CM | POA: Diagnosis present

## 2020-07-04 DIAGNOSIS — J432 Centrilobular emphysema: Secondary | ICD-10-CM | POA: Diagnosis not present

## 2020-07-04 DIAGNOSIS — T502X5A Adverse effect of carbonic-anhydrase inhibitors, benzothiadiazides and other diuretics, initial encounter: Secondary | ICD-10-CM | POA: Diagnosis not present

## 2020-07-04 DIAGNOSIS — Z20822 Contact with and (suspected) exposure to covid-19: Secondary | ICD-10-CM | POA: Diagnosis not present

## 2020-07-04 DIAGNOSIS — I4891 Unspecified atrial fibrillation: Secondary | ICD-10-CM

## 2020-07-04 DIAGNOSIS — N179 Acute kidney failure, unspecified: Secondary | ICD-10-CM | POA: Diagnosis not present

## 2020-07-04 DIAGNOSIS — R338 Other retention of urine: Secondary | ICD-10-CM | POA: Diagnosis present

## 2020-07-04 DIAGNOSIS — Z66 Do not resuscitate: Secondary | ICD-10-CM | POA: Diagnosis not present

## 2020-07-04 DIAGNOSIS — I5022 Chronic systolic (congestive) heart failure: Secondary | ICD-10-CM

## 2020-07-04 DIAGNOSIS — J9621 Acute and chronic respiratory failure with hypoxia: Secondary | ICD-10-CM | POA: Diagnosis not present

## 2020-07-04 DIAGNOSIS — Z79899 Other long term (current) drug therapy: Secondary | ICD-10-CM

## 2020-07-04 DIAGNOSIS — I480 Paroxysmal atrial fibrillation: Secondary | ICD-10-CM | POA: Diagnosis not present

## 2020-07-04 DIAGNOSIS — I5043 Acute on chronic combined systolic (congestive) and diastolic (congestive) heart failure: Secondary | ICD-10-CM | POA: Diagnosis not present

## 2020-07-04 DIAGNOSIS — R4182 Altered mental status, unspecified: Secondary | ICD-10-CM | POA: Diagnosis not present

## 2020-07-04 DIAGNOSIS — R4189 Other symptoms and signs involving cognitive functions and awareness: Secondary | ICD-10-CM | POA: Diagnosis present

## 2020-07-04 DIAGNOSIS — I700 Atherosclerosis of aorta: Secondary | ICD-10-CM | POA: Diagnosis present

## 2020-07-04 LAB — CBC WITH DIFFERENTIAL/PLATELET
Abs Immature Granulocytes: 0.03 10*3/uL (ref 0.00–0.07)
Basophils Absolute: 0.1 10*3/uL (ref 0.0–0.1)
Basophils Relative: 1 %
Eosinophils Absolute: 0.1 10*3/uL (ref 0.0–0.5)
Eosinophils Relative: 1 %
HCT: 51.2 % (ref 39.0–52.0)
Hemoglobin: 16.6 g/dL (ref 13.0–17.0)
Immature Granulocytes: 0 %
Lymphocytes Relative: 6 %
Lymphs Abs: 0.7 10*3/uL (ref 0.7–4.0)
MCH: 31.1 pg (ref 26.0–34.0)
MCHC: 32.4 g/dL (ref 30.0–36.0)
MCV: 95.9 fL (ref 80.0–100.0)
Monocytes Absolute: 1.2 10*3/uL — ABNORMAL HIGH (ref 0.1–1.0)
Monocytes Relative: 11 %
Neutro Abs: 8.9 10*3/uL — ABNORMAL HIGH (ref 1.7–7.7)
Neutrophils Relative %: 81 %
Platelets: 208 10*3/uL (ref 150–400)
RBC: 5.34 MIL/uL (ref 4.22–5.81)
RDW: 14 % (ref 11.5–15.5)
WBC: 10.9 10*3/uL — ABNORMAL HIGH (ref 4.0–10.5)
nRBC: 0 % (ref 0.0–0.2)

## 2020-07-04 LAB — COMPREHENSIVE METABOLIC PANEL
ALT: 30 U/L (ref 0–44)
AST: 30 U/L (ref 15–41)
Albumin: 4.1 g/dL (ref 3.5–5.0)
Alkaline Phosphatase: 48 U/L (ref 38–126)
Anion gap: 12 (ref 5–15)
BUN: 32 mg/dL — ABNORMAL HIGH (ref 8–23)
CO2: 31 mmol/L (ref 22–32)
Calcium: 9.9 mg/dL (ref 8.9–10.3)
Chloride: 97 mmol/L — ABNORMAL LOW (ref 98–111)
Creatinine, Ser: 1.03 mg/dL (ref 0.61–1.24)
GFR calc Af Amer: >60 mL/min (ref >60)
GFR calc non Af Amer: >60 mL/min (ref >60)
Glucose, Bld: 127 mg/dL — ABNORMAL HIGH (ref 70–99)
Potassium: 4.1 mmol/L (ref 3.5–5.1)
Sodium: 140 mmol/L (ref 135–145)
Total Bilirubin: 0.9 mg/dL (ref 0.3–1.2)
Total Protein: 7.2 g/dL (ref 6.5–8.1)

## 2020-07-04 LAB — BLOOD GAS, ARTERIAL
Acid-Base Excess: 6.8 mmol/L — ABNORMAL HIGH (ref 0.0–2.0)
Bicarbonate: 36 mmol/L — ABNORMAL HIGH (ref 20.0–28.0)
O2 Content: 3 L/min
O2 Saturation: 98.5 %
Patient temperature: 98.4
pCO2 arterial: 72.1 mmHg (ref 32.0–48.0)
pH, Arterial: 7.318 — ABNORMAL LOW (ref 7.350–7.450)
pO2, Arterial: 122 mmHg — ABNORMAL HIGH (ref 83.0–108.0)

## 2020-07-04 LAB — BRAIN NATRIURETIC PEPTIDE: B Natriuretic Peptide: 794.8 pg/mL — ABNORMAL HIGH (ref 0.0–100.0)

## 2020-07-04 LAB — SARS CORONAVIRUS 2 BY RT PCR (HOSPITAL ORDER, PERFORMED IN ~~LOC~~ HOSPITAL LAB): SARS Coronavirus 2: NEGATIVE

## 2020-07-04 MED ORDER — SODIUM CHLORIDE 0.9 % IV SOLN
1.0000 g | Freq: Once | INTRAVENOUS | Status: AC
  Administered 2020-07-05: 1 g via INTRAVENOUS
  Filled 2020-07-04: qty 10

## 2020-07-04 MED ORDER — FUROSEMIDE 10 MG/ML IJ SOLN
40.0000 mg | Freq: Once | INTRAMUSCULAR | Status: AC
  Administered 2020-07-05: 40 mg via INTRAVENOUS
  Filled 2020-07-04: qty 4

## 2020-07-04 MED ORDER — IOHEXOL 300 MG/ML  SOLN
75.0000 mL | Freq: Once | INTRAMUSCULAR | Status: AC | PRN
  Administered 2020-07-04: 75 mL via INTRAVENOUS

## 2020-07-04 MED ORDER — DEXAMETHASONE SODIUM PHOSPHATE 10 MG/ML IJ SOLN
10.0000 mg | Freq: Once | INTRAMUSCULAR | Status: AC
  Administered 2020-07-05: 10 mg via INTRAVENOUS
  Filled 2020-07-04: qty 1

## 2020-07-04 MED ORDER — SODIUM CHLORIDE 0.9 % IV SOLN
500.0000 mg | Freq: Once | INTRAVENOUS | Status: AC
  Administered 2020-07-05: 500 mg via INTRAVENOUS
  Filled 2020-07-04: qty 500

## 2020-07-04 NOTE — ED Triage Notes (Signed)
Patient arrived with family who states they have changed his medications, since patient has had increased fatigue and shortness of breath.

## 2020-07-04 NOTE — H&P (Signed)
History and Physical    Mike Gray VCB:[id number] DOB: 1941/01/08 DOA: 07/04/2020  PCP: Constance Haw, MD  Patient coming from: Home, lives with brother who is at bedside  I have personally briefly reviewed patient's old medical records in [place]  Chief Complaint: Fatigue, altered mental status  HPI: Mike Gray is a 79 y.o. male with medical history significant for bipolar disorder, hypertrophic cardiomyopathy, history of nonsustained VT and complete heart block with ICD, atrial fibrillation on Eliquis, chronic systolic heart failure, COPD and history of GI bleed who presents with concerns of altered mental status and increased fatigue.  Patient was somnolent on BiPAP at time evaluation.  History obtained from brother at bedside who is his caretaker and they live together.  Brother noticed about 3 weeks ago that patient was smoking outside after despite him having quit 10 years ago.  He then took the patient to his PCP and was started on a generic form of Chantix.  Since starting the medication he feels like his brother was acting somewhat strange and decided to stop it about 3 days ago.  He also noticed that since 3 weeks ago patient has had increasing shortness of breath with ambulating short distances.  Patient has chronic cough and runny nose but nothing acute.  He  had one episode bowel incontinence 3 weeks ago and has been having urinary incontinence for the past 2 weeks although patient reported to his brother that it has been ongoing for 2 months.  They have a planned urology appointment coming up.  Patient also has been having some nausea and decreased appetite but brother has not noticed any vomiting or diarrhea.  For the past 3 days, patient has also had increasing fatigue and was staying in bed all day which is unlike him.  Brother checked his O2 today on room air and found it to be 83% and decided to bring him to ED today.  ED Course: He was afebrile normotensive  and found to have respiratory acidosis with pH of 7.3, PCO2 of 72 on room air and was placed on BiPAP.  CBC shows mild leukocytosis of 10.9.  Glucose of 127.  LFTs and creatinine otherwise normal.  BMP elevated to 794.  CT chest showed diffuse mild airway thickening and scattered secretion with opacity in the right lower lobe that could be an early infectious inflammatory process.  There is also mild bilateral perinephric stranding that is nonspecific.  Review of Systems:  Unable to obtain given patient's somnolence  Past Medical History:  Diagnosis Date  . Angiodysplasia   . Atrial fibrillation ([place])   . CHF (congestive heart failure) ([place])   . COPD (chronic obstructive pulmonary disease) ([place])   . GI bleed   . Hypertension     Past Surgical History:  Procedure Laterality Date  . COLONOSCOPY    . PACEMAKER IMPLANT       reports that he quit smoking about 8 years ago. His smoking use included cigarettes. He has never used smokeless tobacco. He reports previous alcohol use. He reports that he does not use drugs. Social History  No Known Allergies  Family History  Problem Relation Age of Onset  . Healthy Mother   . Breast cancer Father 41  . Cancer Other      Prior to Admission medications   Medication Sig Start Date End Date Taking? Authorizing Provider  apixaban (ELIQUIS) 5 MG TABS tablet Take 1 tablet (5 mg total) by mouth 2 (two) times  daily. 10/13/19   Camnitz, Ocie Doyne, MD  atorvastatin (LIPITOR) 20 MG tablet Take 20 mg by mouth daily.    [provider]  CHANTIX CONTINUING MONTH PAK 1 MG tablet  10/27/18   [provider]  divalproex (DEPAKOTE ER) 500 MG 24 hr tablet Take 1,000 mg by mouth at bedtime. 10/20/19   [provider]  divalproex (DEPAKOTE) 500 MG DR tablet Take 1,000 mg by mouth daily.    [provider]  losartan-hydrochlorothiazide (HYZAAR) 100-12.5 MG tablet Take 1 tablet by mouth daily.    [provider]    metoprolol succinate (TOPROL-XL) 200 MG 24 hr tablet Take 1 tablet (200 mg total) by mouth daily. Take with or immediately following a meal. 02/27/20   Constance Haw, MD    Physical Exam: Vitals:   07/04/20 1929 07/04/20 2030 07/04/20 2045 07/04/20 2245  BP: (!) 170/89 (!) 175/91 (!) 184/94 (!) 156/82  Pulse: (!) 59 63 (!) 59 60  Resp: 15 (!) 39 (!) 27 (!) 23  Temp: 98.4 F (36.9 C)     TempSrc: Oral     SpO2: 96% 95% 96% 94%    Constitutional: NAD, calm, comfortable, nontoxic appearing male laying flat in bed on BiPAP Vitals:   07/04/20 1929 07/04/20 2030 07/04/20 2045 07/04/20 2245  BP: (!) 170/89 (!) 175/91 (!) 184/94 (!) 156/82  Pulse: (!) 59 63 (!) 59 60  Resp: 15 (!) 39 (!) 27 (!) 23  Temp: 98.4 F (36.9 C)     TempSrc: Oral     SpO2: 96% 95% 96% 94%   Eyes: PERRL, lids and conjunctivae normal ENMT: Mucous membranes are moist.  Neck: normal, supple,  Respiratory: clear to auscultation bilaterally, no wheezing, no crackles although difficult to auscultate while patient is on BiPAP. Normal respiratory effort. No accessory muscle use.  Cardiovascular: Regular rate and rhythm, no murmurs / rubs / gallops. No extremity edema. 2+ pedal pulses.   Abdomen: Grimace throughout palpation of the entire abdomen with involuntary guarding, no rigidity, bowel sounds positive.  Musculoskeletal: no clubbing / cyanosis. No joint deformity upper and lower extremities.Normal muscle tone.  Skin: no rashes, lesions, ulcers. No induration Neurologic: Patient is somnolent and required continuous noxious stimuli with sternal rubbing and was only able to briefly open eyes for a few seconds.  Encephalopathy appears worse than can be explained by respiratory acidosis.  PERRL.  Unable to follow any commands or answer any questions. Psychiatric: Patient is somnolent and obtunded.    Labs on Admission: I have personally reviewed following labs and imaging studies  CBC: Recent Labs  Lab  07/04/20 1948  WBC 10.9*  NEUTROABS 8.9*  HGB 16.6  HCT 51.2  MCV 95.9  PLT 324   Basic Metabolic Panel: Recent Labs  Lab 07/04/20 1948  NA 140  K 4.1  CL 97*  CO2 31  GLUCOSE 127*  BUN 32*  CREATININE 1.03  CALCIUM 9.9   GFR: CrCl cannot be calculated (Unknown ideal weight.). Liver Function Tests: Recent Labs  Lab 07/04/20 1948  AST 30  ALT 30  ALKPHOS 48  BILITOT 0.9  PROT 7.2  ALBUMIN 4.1   No results for input(s): LIPASE, AMYLASE in the last 168 hours. No results for input(s): AMMONIA in the last 168 hours. Coagulation Profile: No results for input(s): INR, PROTIME in the last 168 hours. Cardiac Enzymes: No results for input(s): CKTOTAL, CKMB, CKMBINDEX, TROPONINI in the last 168 hours. BNP (last 3 results) No results for  input(s): PROBNP in the last 8760 hours. HbA1C: No results for input(s): HGBA1C in the last 72 hours. CBG: No results for input(s): GLUCAP in the last 168 hours. Lipid Profile: No results for input(s): CHOL, HDL, LDLCALC, TRIG, CHOLHDL, LDLDIRECT in the last 72 hours. Thyroid Function Tests: No results for input(s): TSH, T4TOTAL, FREET4, T3FREE, THYROIDAB in the last 72 hours. Anemia Panel: No results for input(s): VITAMINB12, FOLATE, FERRITIN, TIBC, IRON, RETICCTPCT in the last 72 hours. Urine analysis: No results found for: COLORURINE, APPEARANCEUR, [place], [place], [place], [place], [place], [place], [place], [place], NITRITE, LEUKOCYTESUR  Radiological Exams on Admission: DG Chest 2 View  Result Date: 07/04/2020 CLINICAL DATA:  Fatigue EXAM: CHEST - 2 VIEW COMPARISON:  October 17, 2018 FINDINGS: There is a multi lead left-sided pacemaker/ICD in place. Heart size remains stable but enlarged. There is no pneumothorax. No large pleural effusion. There is an apparent new infrahilar opacity on the lateral view. A correlate abnormality on the frontal view is not well appreciated. IMPRESSION: Apparent new infrahilar opacity  best visualized on the lateral view. This may represent an area of atelectasis, infiltrate, versus less likely adenopathy. Consider further evaluation with a contrast enhanced CT or a outpatient two-view chest x-ray in 4-6 weeks. Electronically Signed   By: Constance Holster M.D.   On: 07/04/2020 20:02   CT Chest W Contrast  Result Date: 07/04/2020 CLINICAL DATA:  Abnormal chest radiograph, increasing fatigue and shortness of breath, recent medication changes EXAM: CT CHEST WITH CONTRAST TECHNIQUE: Multidetector CT imaging of the chest was performed during intravenous contrast administration. CONTRAST:  39mL OMNIPAQUE IOHEXOL 300 MG/ML  SOLN COMPARISON:  Chest radiograph 07/04/2020 FINDINGS: Cardiovascular: Cardiac size is upper limits normal. Three-vessel coronary artery calcifications are present. Calcifications also present on the mitral annulus and minimally on the aortic leaflets. Pacer/defibrillator leads are positioned at the right atrium and cardiac apex with a left chest wall battery pack and leads approaching via the left subclavian vein. Atherosclerotic plaque within the normal caliber aorta. No acute luminal abnormality of the imaged aorta. No periaortic stranding or hemorrhage. Normal 3 vessel branching of the aortic arch. Proximal great vessels are mildly calcified but otherwise unremarkable. Central pulmonary arteries are upper limits normal caliber. No large central or lobar filling defects on this non tailored examination of the pulmonary arteries. Mediastinum/Nodes: No mediastinal fluid or gas. Normal thyroid gland and thoracic inlet. No acute abnormality of the trachea or esophagus. No worrisome mediastinal, hilar or axillary adenopathy. Lungs/Pleura: There is a background of moderate centrilobular emphysema demonstrating an apical predominance. Some atelectatic changes are present in the dependent portions of the lungs. There is diffuse mild airways thickening and scattered secretions with  some more focal reticular opacity noted in the right lower lobe which corresponds well to the area developing opacity on chest radiography. No pneumothorax or visible effusion. No convincing features of edema. Upper Abdomen: Calcified gallstones are present within the gallbladder body and neck with a prominent fold in the proximal gallbladder as well. No pericholecystic fluid or inflammation. No visible intraductal gallstones or biliary ductal dilatation. Small accessory splenules are noted. Mild bilateral perinephric stranding is nonspecific. Probable fluid attenuation cyst noted in the upper pole left kidney measuring up to 1.6 cm though incompletely included within the level of imaging. Musculoskeletal: Multilevel degenerative changes are present in the imaged portions of the spine. Additional mild degenerative changes in the shoulders. No acute osseous abnormality or suspicious osseous lesion. Left chest wall battery pack, as above. Small ovoid hypoattenuating  intradermal lesion in the posterior soft tissues to the left of midline measuring up to 2.2 cm, likely reflecting a dermal inclusion cyst (2/114). No worrisome chest wall lesions. IMPRESSION: 1. Diffuse mild airways thickening and scattered secretions with some more focal reticular opacity in the right lower lobe which corresponds well to the area developing opacity on chest radiography. Could reflect a developing an early infectious/inflammatory process. Consider follow-up imaging in 3 months with noncontrast CT to assess for resolution. 2. Cholelithiasis without evidence of acute cholecystitis. 3. Mild bilateral perinephric stranding, nonspecific. Could correlate with age or diminished renal function. 4. Three-vessel coronary artery atherosclerosis. 5. Mitral annular and aortic leaflet calcifications. 6.  Aortic Atherosclerosis (ICD10-I70.0). 7. Emphysema (ICD10-J43.9). Electronically Signed   By: Lovena Le M.D.   On: 07/04/2020 22:44       Assessment/Plan  Acute hypercarbic respiratory failure secondary to COPD exacerbation Continue BiPAP.  Admit to stepdown unit. Continue Rocephin and azithromycin Continue daily IV Solu-Medrol  Acute metabolic encephalopathy Likely secondary to acute hypercarbic respiratory failure with respiratory acidosis but patient was minimally responsive even to noxious stimuli. Obtain stat CT head given that he is on Eliquis.  Also will obtain stat CT abdomen pelvic since he had involuntary guarding on exam and grimacing.  Patient still unable to provide urine sample-will do in and out cath  History of hypertrophic cardiomyopathy, nonsustained VT and complete heart block with ICD No changes noted to EKG from prior  Bipolar disorder Continue Depakote when able to tolerate p.o.  Atrial fibrillation Continue Eliquis pending CT head result  Chronic systolic heart failure BNP elevated but no findings of fluid overload on exam or on CT chest.  Has received one-time dose of Lasix given by by ED physician.  Will hold off on any further Lasix for now.  DVT prophylaxis: Eliquis Code Status: DNR-confirmed with brother Family Communication: Plan discussed with brother at bedside  disposition Plan: Home with at least 2 midnight stays  Consults called:  Admission status: inpatient  Status is: Inpatient  Remains inpatient appropriate because:Inpatient level of care appropriate due to severity of illness   Dispo: The patient is from: Home              Anticipated d/c is to: Home              Anticipated d/c date is: 3 days              Patient currently is not medically stable to d/c.         Orene Desanctis DO [facility]   If 7PM-7AM, please contact night-coverage [email or web address]   07/04/2020, 11:53 PM

## 2020-07-04 NOTE — ED Provider Notes (Signed)
[name] DEPT Provider Note   CSN: [id number] Arrival date & time: 07/04/20  1915     History Chief Complaint  Patient presents with  . Shortness of Breath  . Medication Problem    Mike Gray is a 79 y.o. male.  HPI    Patient presents with his brother who provides the history. The patient self answers some questions briefly, the quietly, and without any depth. The patient has history of multiple medical issues including CHF, COPD, behavioral health disorder.  His brother is his primary caregiver. Brother notes that about 2 weeks the patient began to have decline in condition, and as an example this, the patient was found smoking, which she has not done in years. Patient saw his physician, was started on Chantix.  He took this medication for about 1 week, but continued to decline his condition, although no reported additional smoking episodes. Over the past 3 or 4 days the patient has had new progression beyond this more gradual decline with new necessity of wearing diapers, episodes of defecating in the house, and hypersomnolence beyond baseline. There is associated anorexia, but no vomiting, no diarrhea, no fever.  Patient was also found to be hypoxic, 83% on room air at home prior to the brother calling his physician who encouraged him to present for evaluation.  Level 5 caveat secondary to the patient's cognitive impairment/acuity of condition, though HPI seems reasonable given the patient's brothers present. Past Medical History:  Diagnosis Date  . Angiodysplasia   . Atrial fibrillation ([place])   . CHF (congestive heart failure) ([place])   . COPD (chronic obstructive pulmonary disease) ([place])   . GI bleed   . Hypertension     There are no problems to display for this patient.   Past Surgical History:  Procedure Laterality Date  . COLONOSCOPY    . PACEMAKER IMPLANT         Family History  Problem Relation Age of Onset  . Healthy  Mother   . Breast cancer Father 41  . Cancer Other     Social History   Tobacco Use  . Smoking status: Former Smoker    Types: Cigarettes    Quit date: 2013    Years since quitting: 8.7  . Smokeless tobacco: Never Used  Vaping Use  . Vaping Use: Never used  Substance Use Topics  . Alcohol use: Not Currently  . Drug use: Never    Home Medications Prior to Admission medications   Medication Sig Start Date End Date Taking? Authorizing Provider  apixaban (ELIQUIS) 5 MG TABS tablet Take 1 tablet (5 mg total) by mouth 2 (two) times daily. 10/13/19   Camnitz, Ocie Doyne, MD  atorvastatin (LIPITOR) 20 MG tablet Take 20 mg by mouth daily.    [provider]  CHANTIX CONTINUING MONTH PAK 1 MG tablet  10/27/18   [provider]  divalproex (DEPAKOTE ER) 500 MG 24 hr tablet Take 1,000 mg by mouth at bedtime. 10/20/19   [provider]  divalproex (DEPAKOTE) 500 MG DR tablet Take 1,000 mg by mouth daily.    [provider]  losartan-hydrochlorothiazide (HYZAAR) 100-12.5 MG tablet Take 1 tablet by mouth daily.    [provider]  metoprolol succinate (TOPROL-XL) 200 MG 24 hr tablet Take 1 tablet (200 mg total) by mouth daily. Take with or immediately following a meal. 02/27/20   Camnitz, Ocie Doyne, MD    Allergies    Patient has no known allergies.  Review of Systems   Review of Systems  Unable to perform ROS: Acuity of condition    Physical Exam Updated Vital Signs BP (!) 156/82   Pulse 60   Temp 98.4 F (36.9 C) (Oral)   Resp (!) 23   SpO2 94%   Physical Exam Vitals and nursing note reviewed.  Constitutional:      Appearance: He is ill-appearing.     Comments: Thin adult male withdrawn, answering questions only when directly spoken to, and briefly at that  HENT:     Head: Normocephalic and atraumatic.  Eyes:     Conjunctiva/sclera: Conjunctivae normal.  Cardiovascular:     Rate and Rhythm: Normal rate and regular rhythm.    Pulmonary:     Effort: Tachypnea present.  Abdominal:     General: There is no distension.  Skin:    General: Skin is warm and dry.  Neurological:     Cranial Nerves: No cranial nerve deficit or facial asymmetry.     Motor: Atrophy present.  Psychiatric:        Behavior: Behavior is slowed and withdrawn.        Cognition and Memory: Cognition is impaired. Memory is impaired.      ED Results / Procedures / Treatments   Labs (all labs ordered are listed, but only abnormal results are displayed) Labs Reviewed  COMPREHENSIVE METABOLIC PANEL - Abnormal; Notable for the following components:      Result Value   Chloride 97 (*)    Glucose, Bld 127 (*)    BUN 32 (*)    All other components within normal limits  CBC WITH DIFFERENTIAL/PLATELET - Abnormal; Notable for the following components:   WBC 10.9 (*)    Neutro Abs 8.9 (*)    Monocytes Absolute 1.2 (*)    All other components within normal limits  BRAIN NATRIURETIC PEPTIDE - Abnormal; Notable for the following components:   B Natriuretic Peptide 794.8 (*)    All other components within normal limits  BLOOD GAS, ARTERIAL - Abnormal; Notable for the following components:   pH, Arterial 7.318 (*)    pCO2 arterial 72.1 (*)    pO2, Arterial 122 (*)    Bicarbonate 36.0 (*)    Acid-Base Excess 6.8 (*)    All other components within normal limits  SARS CORONAVIRUS 2 BY RT PCR (HOSPITAL ORDER, [facility])  URINALYSIS, ROUTINE W REFLEX MICROSCOPIC    EKG EKG Interpretation  Date/Time:  Sunday July 04 2020 19:29:31 EDT Ventricular Rate:  60 PR Interval:    QRS Duration: 178 QT Interval:  470 QTC Calculation: 470 R Axis:   -84 Text Interpretation: Afib/flutter and ventricular-paced rhythm No further analysis attempted due to paced rhythm 12 Lead; [facility] Abnormal ECG Confirmed by Carmin Muskrat [phone]) on 07/04/2020 7:42:19 PM   Radiology DG Chest 2 View  Result Date:  07/04/2020 CLINICAL DATA:  Fatigue EXAM: CHEST - 2 VIEW COMPARISON:  October 17, 2018 FINDINGS: There is a multi lead left-sided pacemaker/ICD in place. Heart size remains stable but enlarged. There is no pneumothorax. No large pleural effusion. There is an apparent new infrahilar opacity on the lateral view. A correlate abnormality on the frontal view is not well appreciated. IMPRESSION: Apparent new infrahilar opacity best visualized on the lateral view. This may represent an area of atelectasis, infiltrate, versus less likely adenopathy. Consider further evaluation with a contrast enhanced CT or a outpatient two-view chest x-ray in 4-6 weeks. Electronically Signed  By: Constance Holster M.D.   On: 07/04/2020 20:02   CT Chest W Contrast  Result Date: 07/04/2020 CLINICAL DATA:  Abnormal chest radiograph, increasing fatigue and shortness of breath, recent medication changes EXAM: CT CHEST WITH CONTRAST TECHNIQUE: Multidetector CT imaging of the chest was performed during intravenous contrast administration. CONTRAST:  44mL OMNIPAQUE IOHEXOL 300 MG/ML  SOLN COMPARISON:  Chest radiograph 07/04/2020 FINDINGS: Cardiovascular: Cardiac size is upper limits normal. Three-vessel coronary artery calcifications are present. Calcifications also present on the mitral annulus and minimally on the aortic leaflets. Pacer/defibrillator leads are positioned at the right atrium and cardiac apex with a left chest wall battery pack and leads approaching via the left subclavian vein. Atherosclerotic plaque within the normal caliber aorta. No acute luminal abnormality of the imaged aorta. No periaortic stranding or hemorrhage. Normal 3 vessel branching of the aortic arch. Proximal great vessels are mildly calcified but otherwise unremarkable. Central pulmonary arteries are upper limits normal caliber. No large central or lobar filling defects on this non tailored examination of the pulmonary arteries. Mediastinum/Nodes: No  mediastinal fluid or gas. Normal thyroid gland and thoracic inlet. No acute abnormality of the trachea or esophagus. No worrisome mediastinal, hilar or axillary adenopathy. Lungs/Pleura: There is a background of moderate centrilobular emphysema demonstrating an apical predominance. Some atelectatic changes are present in the dependent portions of the lungs. There is diffuse mild airways thickening and scattered secretions with some more focal reticular opacity noted in the right lower lobe which corresponds well to the area developing opacity on chest radiography. No pneumothorax or visible effusion. No convincing features of edema. Upper Abdomen: Calcified gallstones are present within the gallbladder body and neck with a prominent fold in the proximal gallbladder as well. No pericholecystic fluid or inflammation. No visible intraductal gallstones or biliary ductal dilatation. Small accessory splenules are noted. Mild bilateral perinephric stranding is nonspecific. Probable fluid attenuation cyst noted in the upper pole left kidney measuring up to 1.6 cm though incompletely included within the level of imaging. Musculoskeletal: Multilevel degenerative changes are present in the imaged portions of the spine. Additional mild degenerative changes in the shoulders. No acute osseous abnormality or suspicious osseous lesion. Left chest wall battery pack, as above. Small ovoid hypoattenuating intradermal lesion in the posterior soft tissues to the left of midline measuring up to 2.2 cm, likely reflecting a dermal inclusion cyst (2/114). No worrisome chest wall lesions. IMPRESSION: 1. Diffuse mild airways thickening and scattered secretions with some more focal reticular opacity in the right lower lobe which corresponds well to the area developing opacity on chest radiography. Could reflect a developing an early infectious/inflammatory process. Consider follow-up imaging in 3 months with noncontrast CT to assess for  resolution. 2. Cholelithiasis without evidence of acute cholecystitis. 3. Mild bilateral perinephric stranding, nonspecific. Could correlate with age or diminished renal function. 4. Three-vessel coronary artery atherosclerosis. 5. Mitral annular and aortic leaflet calcifications. 6.  Aortic Atherosclerosis (ICD10-I70.0). 7. Emphysema (ICD10-J43.9). Electronically Signed   By: Lovena Le M.D.   On: 07/04/2020 22:44    Procedures Procedures (including critical care time)  Medications Ordered in ED Medications  azithromycin (ZITHROMAX) 500 mg in sodium chloride 0.9 % 250 mL IVPB (has no administration in time range)  cefTRIAXone (ROCEPHIN) 1 g in sodium chloride 0.9 % 100 mL IVPB (has no administration in time range)  dexamethasone (DECADRON) injection 10 mg (has no administration in time range)  furosemide (LASIX) injection 40 mg (has no administration in time range)  iohexol (  OMNIPAQUE) 300 MG/ML solution 75 mL (75 mLs Intravenous Contrast Given 07/04/20 2223)    ED Course  I have reviewed the triage vital signs and the nursing notes.  Pertinent labs & imaging results that were available during my care of the patient were reviewed by me and considered in my medical decision making (see chart for details).     -Labs notable for elevated carbon dioxide, consistent with initial concern for hypercarbia.  Patient will start BiPAP. Initial x-ray demonstrates new infrahilar opacity, CT scan pending.  Update:, Covid negative, patient starting BiPAP.  10:57 PM Patient with BiPAP, remaining labs notable for slight cytosis, elevated BNP, and CT reviewed, notable for opacification concerning for infection versus inflammation. Findings consistent with multifactorial etiology of his dyspnea, hypoxia, listlessness, likely with some component of carbon dioxide retention. Patient's Covid test is negative, vital signs reassuring with only mild tachycardia, no hypotension. Given above abnormalities, the  patient received Lasix, steroids, broad-spectrum antibiotics, will require admission for further monitoring, management.  Final Clinical Impression(s) / ED Diagnoses Final diagnoses:  Community acquired pneumonia of right lower lobe of lung  COPD exacerbation ([place])  Delirium   MDM Number of Diagnoses or Management Options Community acquired pneumonia of right lower lobe of lung: new, needed workup COPD exacerbation ([place]): established, worsening Delirium: new, needed workup   Amount and/or Complexity of Data Reviewed Clinical lab tests: reviewed Tests in the radiology section of CPT: reviewed Tests in the medicine section of CPT: reviewed Decide to obtain previous medical records or to obtain history from someone other than the patient: yes Obtain history from someone other than the patient: yes Review and summarize past medical records: yes Discuss the patient with other providers: yes Independent visualization of images, tracings, or specimens: yes  Risk of Complications, Morbidity, and/or Mortality Presenting problems: high Diagnostic procedures: high Management options: high  Critical Care Total time providing critical care: < 30 minutes (40)  Patient Progress Patient progress: stable    Carmin Muskrat, MD 07/04/20 2259

## 2020-07-04 NOTE — ED Notes (Signed)
Date and time results received: 07/04/20 2039 (use smartphrase ".now" to insert current time)  Test: c02 Critical Value: 72.1  Name of Provider Notified: Vanita Panda, MD   Orders Received? Or Actions Taken?: Orders Received - See Orders for details

## 2020-07-04 NOTE — Progress Notes (Signed)
Pt. placed on BiPAP V-60 per order @ this time after pending Covid results/CXR/CT scan all completed along with pt. acuity level in ED/hospital, family member remains @ bedside, pt. tolerating well, Dr. Vanita Panda made aware, RT to monitor.

## 2020-07-05 ENCOUNTER — Inpatient Hospital Stay (HOSPITAL_COMMUNITY): Payer: Medicare Other

## 2020-07-05 ENCOUNTER — Encounter (HOSPITAL_COMMUNITY): Payer: Self-pay | Admitting: Family Medicine

## 2020-07-05 DIAGNOSIS — J9622 Acute and chronic respiratory failure with hypercapnia: Secondary | ICD-10-CM

## 2020-07-05 DIAGNOSIS — I4891 Unspecified atrial fibrillation: Secondary | ICD-10-CM

## 2020-07-05 DIAGNOSIS — Z95810 Presence of automatic (implantable) cardiac defibrillator: Secondary | ICD-10-CM

## 2020-07-05 DIAGNOSIS — R0602 Shortness of breath: Secondary | ICD-10-CM

## 2020-07-05 DIAGNOSIS — F319 Bipolar disorder, unspecified: Secondary | ICD-10-CM

## 2020-07-05 DIAGNOSIS — I5022 Chronic systolic (congestive) heart failure: Secondary | ICD-10-CM

## 2020-07-05 DIAGNOSIS — Z8679 Personal history of other diseases of the circulatory system: Secondary | ICD-10-CM

## 2020-07-05 DIAGNOSIS — I480 Paroxysmal atrial fibrillation: Secondary | ICD-10-CM

## 2020-07-05 DIAGNOSIS — J9602 Acute respiratory failure with hypercapnia: Secondary | ICD-10-CM

## 2020-07-05 DIAGNOSIS — I422 Other hypertrophic cardiomyopathy: Secondary | ICD-10-CM

## 2020-07-05 DIAGNOSIS — J441 Chronic obstructive pulmonary disease with (acute) exacerbation: Secondary | ICD-10-CM

## 2020-07-05 LAB — BLOOD GAS, ARTERIAL
Acid-Base Excess: 7.4 mmol/L — ABNORMAL HIGH (ref 0.0–2.0)
Bicarbonate: 35 mmol/L — ABNORMAL HIGH (ref 20.0–28.0)
Delivery systems: POSITIVE
Drawn by: 225631
FIO2: 32
O2 Saturation: 91.8 %
Patient temperature: 98.4
RATE: 16 resp/min
pCO2 arterial: 62 mmHg — ABNORMAL HIGH (ref 32.0–48.0)
pH, Arterial: 7.369 (ref 7.350–7.450)
pO2, Arterial: 68.9 mmHg — ABNORMAL LOW (ref 83.0–108.0)

## 2020-07-05 LAB — URINALYSIS, ROUTINE W REFLEX MICROSCOPIC
Bacteria, UA: NONE SEEN
Bilirubin Urine: NEGATIVE
Glucose, UA: NEGATIVE mg/dL
Hgb urine dipstick: NEGATIVE
Ketones, ur: NEGATIVE mg/dL
Leukocytes,Ua: NEGATIVE
Nitrite: NEGATIVE
Protein, ur: 100 mg/dL — AB
Specific Gravity, Urine: 1.024 (ref 1.005–1.030)
pH: 6 (ref 5.0–8.0)

## 2020-07-05 LAB — BASIC METABOLIC PANEL
Anion gap: 11 (ref 5–15)
BUN: 32 mg/dL — ABNORMAL HIGH (ref 8–23)
CO2: 34 mmol/L — ABNORMAL HIGH (ref 22–32)
Calcium: 9.4 mg/dL (ref 8.9–10.3)
Chloride: 94 mmol/L — ABNORMAL LOW (ref 98–111)
Creatinine, Ser: 1.04 mg/dL (ref 0.61–1.24)
GFR calc Af Amer: >60 mL/min (ref >60)
GFR calc non Af Amer: >60 mL/min (ref >60)
Glucose, Bld: 116 mg/dL — ABNORMAL HIGH (ref 70–99)
Potassium: 4.5 mmol/L (ref 3.5–5.1)
Sodium: 139 mmol/L (ref 135–145)

## 2020-07-05 LAB — CBC
HCT: 49.8 % (ref 39.0–52.0)
Hemoglobin: 15.8 g/dL (ref 13.0–17.0)
MCH: 30.9 pg (ref 26.0–34.0)
MCHC: 31.7 g/dL (ref 30.0–36.0)
MCV: 97.3 fL (ref 80.0–100.0)
Platelets: 208 10*3/uL (ref 150–400)
RBC: 5.12 MIL/uL (ref 4.22–5.81)
RDW: 14 % (ref 11.5–15.5)
WBC: 8.6 10*3/uL (ref 4.0–10.5)
nRBC: 0 % (ref 0.0–0.2)

## 2020-07-05 LAB — ECHOCARDIOGRAM COMPLETE
Area-P 1/2: 2.34 cm2
S' Lateral: 3.29 cm

## 2020-07-05 MED ORDER — ALBUTEROL SULFATE (2.5 MG/3ML) 0.083% IN NEBU: 2.5000 mg | INHALATION_SOLUTION | RESPIRATORY_TRACT | Status: DC | PRN

## 2020-07-05 MED ORDER — IOHEXOL 300 MG/ML  SOLN
100.0000 mL | Freq: Once | INTRAMUSCULAR | Status: AC | PRN
  Administered 2020-07-05: 100 mL via INTRAVENOUS

## 2020-07-05 MED ORDER — MOMETASONE FURO-FORMOTEROL FUM 100-5 MCG/ACT IN AERO
2.0000 | INHALATION_SPRAY | Freq: Two times a day (BID) | RESPIRATORY_TRACT | Status: DC
  Administered 2020-07-05 – 2020-07-09 (×7): 2 via RESPIRATORY_TRACT
  Filled 2020-07-05 (×2): qty 8.8

## 2020-07-05 MED ORDER — METHYLPREDNISOLONE SODIUM SUCC 40 MG IJ SOLR: 40.0000 mg | Freq: Every day | INTRAMUSCULAR | Status: DC

## 2020-07-05 MED ORDER — IPRATROPIUM-ALBUTEROL 0.5-2.5 (3) MG/3ML IN SOLN
3.0000 mL | RESPIRATORY_TRACT | Status: DC
  Administered 2020-07-05: 3 mL via RESPIRATORY_TRACT
  Filled 2020-07-05: qty 3

## 2020-07-05 MED ORDER — METHYLPREDNISOLONE SODIUM SUCC 125 MG IJ SOLR
60.0000 mg | Freq: Three times a day (TID) | INTRAMUSCULAR | Status: DC
  Administered 2020-07-05 – 2020-07-06 (×4): 60 mg via INTRAVENOUS
  Filled 2020-07-05 (×4): qty 2

## 2020-07-05 MED ORDER — APIXABAN 5 MG PO TABS
5.0000 mg | ORAL_TABLET | Freq: Two times a day (BID) | ORAL | Status: DC
  Administered 2020-07-05 – 2020-07-09 (×9): 5 mg via ORAL
  Filled 2020-07-05 (×9): qty 1

## 2020-07-05 MED ORDER — SODIUM CHLORIDE 0.9 % IV SOLN
500.0000 mg | INTRAVENOUS | Status: DC
  Administered 2020-07-06: 500 mg via INTRAVENOUS
  Filled 2020-07-05: qty 500

## 2020-07-05 MED ORDER — FUROSEMIDE 10 MG/ML IJ SOLN
40.0000 mg | Freq: Two times a day (BID) | INTRAMUSCULAR | Status: DC
  Administered 2020-07-05 – 2020-07-06 (×4): 40 mg via INTRAVENOUS
  Filled 2020-07-05 (×5): qty 4

## 2020-07-05 MED ORDER — SODIUM CHLORIDE 0.9 % IV SOLN
1.0000 g | INTRAVENOUS | Status: DC
  Administered 2020-07-06: 1 g via INTRAVENOUS
  Filled 2020-07-05 (×2): qty 10

## 2020-07-05 MED ORDER — IPRATROPIUM-ALBUTEROL 0.5-2.5 (3) MG/3ML IN SOLN
3.0000 mL | Freq: Three times a day (TID) | RESPIRATORY_TRACT | Status: DC
  Administered 2020-07-05 – 2020-07-09 (×9): 3 mL via RESPIRATORY_TRACT
  Filled 2020-07-05 (×9): qty 3

## 2020-07-05 MED ORDER — DIVALPROEX SODIUM ER 250 MG PO TB24
1000.0000 mg | ORAL_TABLET | Freq: Every day | ORAL | Status: DC
  Administered 2020-07-05 – 2020-07-09 (×5): 1000 mg via ORAL
  Filled 2020-07-05: qty 4
  Filled 2020-07-05: qty 2
  Filled 2020-07-05 (×3): qty 4

## 2020-07-05 MED ORDER — METHYLPREDNISOLONE SODIUM SUCC 125 MG IJ SOLR
INTRAMUSCULAR | Status: AC
  Filled 2020-07-05: qty 2

## 2020-07-05 NOTE — ED Notes (Signed)
RT notified that pts BiPAP mask not fitting correctly.  RT came to bedside, noted pt more alert than previously. RT removed BiPAP, placed pt on [place]. Will continue to monitor.

## 2020-07-05 NOTE — Progress Notes (Signed)
Pt taken off BiPAP by RT at 0925. Pt now on 4L [place] tolerating well with a saturation of 98%. RT will continue to monitor.

## 2020-07-05 NOTE — ED Notes (Signed)
Pt put on bedside commode. Completed a full linen change. Provided privacy for pt to use restroom. A few minutes later pt opened door with blanket wrapped around waist and claimed he wanted "to walk to the bathroom." Pt unstable when walking so pt was redirected to the bedside commode. Pt said he was "finished with the bathroom" so pt was assisted back to the bed. Placed new gown on pt and a male purewick was placed on pt at 50 mmHg.

## 2020-07-05 NOTE — Progress Notes (Signed)
BiPAP V-60 remains at bedside for h/s use 07/05/2020

## 2020-07-05 NOTE — Progress Notes (Signed)
  Echocardiogram 2D Echocardiogram has been performed.  Jennette Dubin 07/05/2020, 2:18 PM

## 2020-07-05 NOTE — ED Notes (Signed)
Pt given jello, applesauce, chicken broth, and a diet cola.

## 2020-07-05 NOTE — Progress Notes (Signed)
Patient ID: Mike Gray, male   DOB: 05-07-1941, 79 y.o.   MRN: [id number]  PROGRESS NOTE    Mike Gray  DEY:[id number] DOB: Oct 23, 1941 DOA: 07/04/2020 PCP: Constance Haw, MD   Brief Narrative:  79 y.o. male with medical history significant for bipolar disorder, hypertrophic cardiomyopathy, history of nonsustained VT and complete heart block with ICD, atrial fibrillation on Eliquis, chronic systolic heart failure, COPD and history of GI bleed presented with altered mental status and increased fatigue on 07/04/2020.  On presentation, patient was somnolent and required BiPAP due to ABG showing PCO2 of 72 and pH of 7.3. CT chest showed diffuse mild airway thickening and scattered secretion with opacity in the right lower lobe that could be an early infectious inflammatory process.  There is also mild bilateral perinephric stranding that is nonspecific.  He was started on intravenous antibiotics.  Assessment & Plan:   Acute hypercapnic respiratory failure Probable community-acquired bacterial pneumonia COPD exacerbation -Required BiPAP on presentation due to ABG showing PCO2 of 72 and pH of 7.3.  Repeat PCO2 of 62. -CT of the chest as above.  COVID-19 testing negative -Continue Rocephin and Zithromax.  Increase Solu-Medrol to 60 mg IV every 8 hours.  Continue duo nebs.  Add Dulera -Wean O2 to nasal cannula as able  Acute metabolic encephalopathy -Probably from respiratory failure.  CT of the head without contrast showed no acute intracranial abnormality.  Monitor mental status.  Fall precautions.  Chronic systolic heart failure -Strict input output.  Daily weights.  Fluid restriction.  Diuretics currently on hold for now.  Monitor  Paroxysmal A. Fib -Rate controlled.  Resume Eliquis  History of nonsustained V. tach/hypertrophic cardiomyopathy/complete heart block status post dual-chamber ICD placement -Outpatient follow-up with cardiology/EP.  Currently rate controlled.  History  of bipolar disorder -Resume Depakote if able to take orally  Generalized deconditioning - will need PT eval once more stable  DVT prophylaxis: Resume Eliquis Code Status: DNR Family Communication: None at bedside Disposition Plan: Status is: Inpatient  Remains inpatient appropriate because:Inpatient level of care appropriate due to severity of illness   Dispo: The patient is from: Home              Anticipated d/c is to: Home              Anticipated d/c date is: 3 days              Patient currently is not medically stable to d/c.  Consultants: None  Procedures: None  Antimicrobials: Rocephin and Zithromax from 07/04/2020 onwards   Subjective: Patient seen and examined at bedside.  Poor historian.  Wakes up slightly.  Does not answer most questions.  No overnight fever or vomiting reported  Objective: Vitals:   07/05/20 0445 07/05/20 0500 07/05/20 0700 07/05/20 0743  BP: 105/67 104/68 114/73 (!) 152/86  Pulse: 60 60 60 60  Resp: 16 16 16 20   Temp:      TempSrc:      SpO2: 92% 93% 92% 95%   No intake or output data in the 24 hours ending 07/05/20 0759 There were no vitals filed for this visit.  Examination:  General exam: Chronically ill.  No distress. ENT: Currently on BiPAP Respiratory system: Bilateral decreased breath sounds at bases with scattered crackles Cardiovascular system: S1 & S2 heard, Rate controlled Gastrointestinal system: Abdomen is nondistended, soft and nontender. Normal bowel sounds heard. Extremities: No cyanosis, clubbing, trace lower extremity edema Central nervous system: Wakes up slightly.  Does not answer most questions.No focal neurological deficits. Moving extremities Skin: No rashes, lesions or ulcers Psychiatry: Could not be assessed because of mental status    Data Reviewed: I have personally reviewed following labs and imaging studies  CBC: Recent Labs  Lab 07/04/20 1948 07/05/20 0422  WBC 10.9* 8.6  NEUTROABS 8.9*  --     HGB 16.6 15.8  HCT 51.2 49.8  MCV 95.9 97.3  PLT 208 245   Basic Metabolic Panel: Recent Labs  Lab 07/04/20 1948 07/05/20 0422  NA 140 139  K 4.1 4.5  CL 97* 94*  CO2 31 34*  GLUCOSE 127* 116*  BUN 32* 32*  CREATININE 1.03 1.04  CALCIUM 9.9 9.4   GFR: CrCl cannot be calculated (Unknown ideal weight.). Liver Function Tests: Recent Labs  Lab 07/04/20 1948  AST 30  ALT 30  ALKPHOS 48  BILITOT 0.9  PROT 7.2  ALBUMIN 4.1   No results for input(s): LIPASE, AMYLASE in the last 168 hours. No results for input(s): AMMONIA in the last 168 hours. Coagulation Profile: No results for input(s): INR, PROTIME in the last 168 hours. Cardiac Enzymes: No results for input(s): CKTOTAL, CKMB, CKMBINDEX, TROPONINI in the last 168 hours. BNP (last 3 results) No results for input(s): PROBNP in the last 8760 hours. HbA1C: No results for input(s): HGBA1C in the last 72 hours. CBG: No results for input(s): GLUCAP in the last 168 hours. Lipid Profile: No results for input(s): CHOL, HDL, LDLCALC, TRIG, CHOLHDL, LDLDIRECT in the last 72 hours. Thyroid Function Tests: No results for input(s): TSH, T4TOTAL, FREET4, T3FREE, THYROIDAB in the last 72 hours. Anemia Panel: No results for input(s): VITAMINB12, FOLATE, FERRITIN, TIBC, IRON, RETICCTPCT in the last 72 hours. Sepsis Labs: No results for input(s): PROCALCITON, LATICACIDVEN in the last 168 hours.  Recent Results (from the past 240 hour(s))  SARS Coronavirus 2 by RT PCR (hospital order, performed in [facility] hospital lab) Nasopharyngeal Nasopharyngeal Swab     Status: None   Collection Time: 07/04/20  8:19 PM   Specimen: Nasopharyngeal Swab  Result Value Ref Range Status   SARS Coronavirus 2 NEGATIVE NEGATIVE Final    Comment: (NOTE) SARS-CoV-2 target nucleic acids are NOT DETECTED.  The SARS-CoV-2 RNA is generally detectable in upper and lower respiratory specimens during the acute phase of infection. The  lowest concentration of SARS-CoV-2 viral copies this assay can detect is 250 copies / mL. A negative result does not preclude SARS-CoV-2 infection and should not be used as the sole basis for treatment or other patient management decisions.  A negative result may occur with improper specimen collection / handling, submission of specimen other than nasopharyngeal swab, presence of viral mutation(s) within the areas targeted by this assay, and inadequate number of viral copies (<250 copies / mL). A negative result must be combined with clinical observations, patient history, and epidemiological information.  Fact Sheet for Patients:   StrictlyIdeas.no  Fact Sheet for Healthcare Providers: BankingDealers.co.za  This test is not yet approved or  cleared by the [place] FDA and has been authorized for detection and/or diagnosis of SARS-CoV-2 by FDA under an Emergency Use Authorization (EUA).  This EUA will remain in effect (meaning this test can be used) for the duration of the COVID-19 declaration under Section 564(b)(1) of the Act, 21 U.S.C. section 360bbb-3(b)(1), unless the authorization is terminated or revoked sooner.  Performed at [facility], [address], [place], [place] [phone]  Radiology Studies: DG Chest 2 View  Result Date: 07/04/2020 CLINICAL DATA:  Fatigue EXAM: CHEST - 2 VIEW COMPARISON:  October 17, 2018 FINDINGS: There is a multi lead left-sided pacemaker/ICD in place. Heart size remains stable but enlarged. There is no pneumothorax. No large pleural effusion. There is an apparent new infrahilar opacity on the lateral view. A correlate abnormality on the frontal view is not well appreciated. IMPRESSION: Apparent new infrahilar opacity best visualized on the lateral view. This may represent an area of atelectasis, infiltrate, versus less likely adenopathy. Consider further evaluation  with a contrast enhanced CT or a outpatient two-view chest x-ray in 4-6 weeks. Electronically Signed   By: Constance Holster M.D.   On: 07/04/2020 20:02   CT HEAD WO CONTRAST  Result Date: 07/05/2020 CLINICAL DATA:  Altered mental status with increased fatigue and shortness of breath. EXAM: CT HEAD WITHOUT CONTRAST TECHNIQUE: Contiguous axial images were obtained from the base of the skull through the vertex without intravenous contrast. COMPARISON:  None. FINDINGS: Brain: There is mild to moderate severity cerebral atrophy with widening of the extra-axial spaces and ventricular dilatation. There are areas of decreased attenuation within the white matter tracts of the supratentorial brain, consistent with microvascular disease changes. Vascular: No hyperdense vessel or unexpected calcification. Skull: Normal. Negative for fracture or focal lesion. Sinuses/Orbits: No acute finding. Other: None. IMPRESSION: 1. Generalized cerebral atrophy. 2. No acute intracranial abnormality. Electronically Signed   By: Virgina Norfolk M.D.   On: 07/05/2020 02:29   CT Chest W Contrast  Result Date: 07/04/2020 CLINICAL DATA:  Abnormal chest radiograph, increasing fatigue and shortness of breath, recent medication changes EXAM: CT CHEST WITH CONTRAST TECHNIQUE: Multidetector CT imaging of the chest was performed during intravenous contrast administration. CONTRAST:  78mL OMNIPAQUE IOHEXOL 300 MG/ML  SOLN COMPARISON:  Chest radiograph 07/04/2020 FINDINGS: Cardiovascular: Cardiac size is upper limits normal. Three-vessel coronary artery calcifications are present. Calcifications also present on the mitral annulus and minimally on the aortic leaflets. Pacer/defibrillator leads are positioned at the right atrium and cardiac apex with a left chest wall battery pack and leads approaching via the left subclavian vein. Atherosclerotic plaque within the normal caliber aorta. No acute luminal abnormality of the imaged aorta. No  periaortic stranding or hemorrhage. Normal 3 vessel branching of the aortic arch. Proximal great vessels are mildly calcified but otherwise unremarkable. Central pulmonary arteries are upper limits normal caliber. No large central or lobar filling defects on this non tailored examination of the pulmonary arteries. Mediastinum/Nodes: No mediastinal fluid or gas. Normal thyroid gland and thoracic inlet. No acute abnormality of the trachea or esophagus. No worrisome mediastinal, hilar or axillary adenopathy. Lungs/Pleura: There is a background of moderate centrilobular emphysema demonstrating an apical predominance. Some atelectatic changes are present in the dependent portions of the lungs. There is diffuse mild airways thickening and scattered secretions with some more focal reticular opacity noted in the right lower lobe which corresponds well to the area developing opacity on chest radiography. No pneumothorax or visible effusion. No convincing features of edema. Upper Abdomen: Calcified gallstones are present within the gallbladder body and neck with a prominent fold in the proximal gallbladder as well. No pericholecystic fluid or inflammation. No visible intraductal gallstones or biliary ductal dilatation. Small accessory splenules are noted. Mild bilateral perinephric stranding is nonspecific. Probable fluid attenuation cyst noted in the upper pole left kidney measuring up to 1.6 cm though incompletely included within the level of imaging. Musculoskeletal: Multilevel degenerative changes are present  in the imaged portions of the spine. Additional mild degenerative changes in the shoulders. No acute osseous abnormality or suspicious osseous lesion. Left chest wall battery pack, as above. Small ovoid hypoattenuating intradermal lesion in the posterior soft tissues to the left of midline measuring up to 2.2 cm, likely reflecting a dermal inclusion cyst (2/114). No worrisome chest wall lesions. IMPRESSION: 1.  Diffuse mild airways thickening and scattered secretions with some more focal reticular opacity in the right lower lobe which corresponds well to the area developing opacity on chest radiography. Could reflect a developing an early infectious/inflammatory process. Consider follow-up imaging in 3 months with noncontrast CT to assess for resolution. 2. Cholelithiasis without evidence of acute cholecystitis. 3. Mild bilateral perinephric stranding, nonspecific. Could correlate with age or diminished renal function. 4. Three-vessel coronary artery atherosclerosis. 5. Mitral annular and aortic leaflet calcifications. 6.  Aortic Atherosclerosis (ICD10-I70.0). 7. Emphysema (ICD10-J43.9). Electronically Signed   By: Lovena Le M.D.   On: 07/04/2020 22:44   CT ABDOMEN PELVIS W CONTRAST  Result Date: 07/05/2020 CLINICAL DATA:  Abdominal pain. EXAM: CT ABDOMEN AND PELVIS WITH CONTRAST TECHNIQUE: Multidetector CT imaging of the abdomen and pelvis was performed using the standard protocol following bolus administration of intravenous contrast. CONTRAST:  12mL OMNIPAQUE IOHEXOL 300 MG/ML  SOLN COMPARISON:  None. FINDINGS: Lower chest: There is mild cardiomegaly. Hepatobiliary: No focal liver abnormality is seen. Subcentimeter gallstones are seen within the gallbladder lumen without evidence of gallbladder wall thickening or biliary dilatation. Pancreas: Unremarkable. No pancreatic ductal dilatation or surrounding inflammatory changes. Spleen: Normal in size without focal abnormality. Adrenals/Urinary Tract: Adrenal glands are unremarkable. Kidneys are normal in size, without renal calculi or hydronephrosis. A 1.7 cm simple cyst is seen within the posteromedial aspect of the mid right kidney. Contrast is seen within the lumen of a mildly distended urinary bladder. This is increased in attenuation within the dependent portion of the bladder. Stomach/Bowel: Stomach is within normal limits. Appendix appears normal. No evidence  of bowel wall thickening, distention, or inflammatory changes. Noninflamed diverticula are seen throughout the large bowel. Vascular/Lymphatic: There is marked severity calcification of the abdominal aorta and bilateral common iliac arteries, without evidence of aneurysmal dilatation or dissection. No enlarged abdominal or pelvic lymph nodes. Reproductive: The prostate gland is markedly enlarged. Other: No abdominal wall hernia or abnormality. No abdominopelvic ascites. Musculoskeletal: Multilevel degenerative changes seen throughout the lumbar spine. IMPRESSION: 1. Cholelithiasis without evidence of cholecystitis. 2. Noninflamed diverticula throughout the large bowel. 3. Markedly enlarged prostate gland. 4. Aortic atherosclerosis. 5. 1.7 cm right renal cyst. Aortic Atherosclerosis (ICD10-I70.0). Electronically Signed   By: Virgina Norfolk M.D.   On: 07/05/2020 02:36        Scheduled Meds: . [START ON 07/06/2020] methylPREDNISolone (SOLU-MEDROL) injection  40 mg Intravenous Daily   Continuous Infusions: . [START ON 07/06/2020] azithromycin    . [START ON 07/06/2020] cefTRIAXone (ROCEPHIN)  IV            Aline August, MD [facility] 07/05/2020, 7:59 AM

## 2020-07-05 NOTE — Evaluation (Addendum)
Clinical/Bedside Swallow Evaluation Patient Details  Name: Mike Gray MRN: [id number] Date of Birth: 07/31/1941  Today's Date: 07/05/2020 Time: SLP Start Time (ACUTE ONLY): 1214 SLP Stop Time (ACUTE ONLY): 1315 SLP Time Calculation (min) (ACUTE ONLY): 61 min  Past Medical History:  Past Medical History:  Diagnosis Date  . Angiodysplasia   . Atrial fibrillation ([place])   . CHF (congestive heart failure) ([place])   . COPD (chronic obstructive pulmonary disease) ([place])   . GI bleed   . Hypertension    Past Surgical History:  Past Surgical History:  Procedure Laterality Date  . COLONOSCOPY    . PACEMAKER IMPLANT     HPI:  79 yo male adm to [facility] with decline in function x2 weeks, not taking medications x1 week and return to smoking.  Pt PMH + for COPD, bipolar, GI bleed, CHF, hypertrophic cardiomyopathy, Afib.  Pt imaging study 07/04/2020 showed right LL opacity with scattered secretions in airway concerning for early infectious inflammatory process.  Per MD notes, pt without vomiting over night.  Imaging of abdomen 07/05/2020 showed Cholelithiasis without evidence of cholecystitis.2. Noninflamed diverticula throughout the large bowel. 3. Enlarged prostate Swallow eval ordered.  Pt's brother reports that the pt did not get OOB for four days prior to admission and pt did not verbalize reasoning.  He had been taking a medication to cease smoking but starting having hallucinations and AMS and thus it was discontinued.  Pt's brother denies pt having had pna as long as he has resided in [place] from [facility] over the last 3 years.    Assessment / Plan / Recommendation Clinical Impression  Pt with negative CN exam and good tolerance of liquid consumption via straw.  Did not conduct 3 ounce Yale swallow test due to his fluid restriction.  With liquids including Broth, Icee, Cranberry Juice and water, swallow appeared timely with clear voice throughout.  Pt consumed a few bites of jello- after 4th bolus - he began  sneezing, coughing copiously and expectorating secretions with partially masticated jello.  Later when returning to jello consumption with VERY small boluses and pt allowing jellow to melt in his mouth, no further  episodes occurred.      Brother *whom pt resides with* states his brother with have his nose run, sneeze and cough with intake of food at times (not consistently) and worsening in the last 2 months.  Pt denies any symptoms of reflux or stasis, lodging of food in pharynx or esophagus.  SlP questions source of pt's clinical signs of dysphagia and suspect pharyngo-cervical esophageal or esophageal.  Pt denies vomiting at home prior to admit also.    With concern for Right LL lung involvement, ? if he is having some aspiration of solids.    If not medically contraindicated,consideration for esophagram would help to delineate source of dysphagia.  Flouro is down at this time therefore pt unable to have any type of barium swallow evaluation today.  Advised pt and his brother to aspiration precautions and importance of ceasing po if dyspneic.    Requested pt and brother attend to how pt tolerates po intake and if coughing/sneezing/expectoration occurs - documents when, what foods, etc to provide detailed information.  Would advance only to full liquids until source of pt's dysphagia is determined and informed RN.  Thanks for this consult and SlP will follow up next date.   SLP Visit Diagnosis: Dysphagia, pharyngoesophageal phase (R13.14);Dysphagia, unspecified (R13.10)    Aspiration Risk  Moderate aspiration risk;Risk for inadequate  nutrition/hydration    Diet Recommendation Thin liquid;Other (Comment) (full liquids)   Medication Administration: Whole meds with liquid Supervision: Patient able to self feed Compensations: Slow rate;Small sips/bites Postural Changes: Seated upright at 90 degrees;Remain upright for at least 30 minutes after po intake    Other  Recommendations Oral Care  Recommendations: Oral care QID   Follow up Recommendations   TBD     Frequency and Duration min 2x/week  2 weeks       Prognosis Prognosis for Safe Diet Advancement: Fair Barriers to Reach Goals: Time post onset;Severity of deficits      Swallow Study   General Date of Onset: 05/04/20 HPI: 79 yo male adm to [facility] with decline in function x2 weeks, not taking medications x1 week and return to smoking.  Pt PMH + for COPD, bipolar, GI bleed, CHF, hypertrophic cardiomyopathy, Afib.  Pt imaging study 07/04/2020 showed right LL opacity with scattered secretions in airway concerning for early infectious inflammatory process.  Per MD notes, pt without vomiting over night.  Imaging of abdomen 07/05/2020 showed Cholelithiasis without evidence of cholecystitis.2. Noninflamed diverticula throughout the large bowel. 3. Enlarged prostate Swallow eval ordered. Type of Study: Bedside Swallow Evaluation Previous Swallow Assessment: none in epic Diet Prior to this Study: Thin liquids Temperature Spikes Noted: No Respiratory Status: Nasal cannula (4) History of Recent Intubation: No Behavior/Cognition: Alert;Other (Comment) (defers to brother to provide information frequently) Oral Cavity Assessment: Within Functional Limits Oral Care Completed by SLP: No Oral Cavity - Dentition:  (pt has dentures, not in place, he is on liquid diet and did not want them in yet) Vision: Functional for self-feeding Self-Feeding Abilities: Able to feed self (tremorous) Patient Positioning: Upright in bed Baseline Vocal Quality: Normal Volitional Cough: Strong Volitional Swallow: Able to elicit    Oral/Motor/Sensory Function Overall Oral Motor/Sensory Function: Within functional limits   Ice Chips Ice chips: Not tested   Thin Liquid Thin Liquid: Within functional limits Presentation: Self Fed;Straw    Nectar Thick Nectar Thick Liquid: Not tested   Honey Thick Honey Thick Liquid: Not tested   Puree Puree:  Impaired Presentation: Self Fed;Spoon Oral Phase Functional Implications: Prolonged oral transit (? mild delay in oral transiting vs pt allowing jello to melt) Pharyngeal Phase Impairments: Cough - Immediate;Other (comments) Other Comments: With 3rd bolus of jello, pt presented with sneezing, copious coughing and expectoration of jello x1 , With later trials-- SLP fed pt very small boluses - which he allowed to melt and then swallowed, on occasion his swallow appears to be discoordinated and effortful- he denies significant issue   Solid     Solid: Not tested      Macario Golds 07/05/2020,2:27 PM  [name], MS [place] Office [phone]

## 2020-07-05 NOTE — ED Notes (Signed)
Per speech therapist, pt is appropriate to be advanced to full liquid diet.  Pt reports no GI discomfort after breakfast tray.

## 2020-07-05 NOTE — Progress Notes (Signed)
Pt. taken of BiPAP V-60 for transport to CT for X scans, placed on 2 lpm n/c, oxygen tank full on ED stretcher from 06, RT remaining in ED to be available if needed by CT/replace pt. on BiPAP once returned.

## 2020-07-05 NOTE — Progress Notes (Signed)
Pt. placed back on BiPAP once returned from CT, pts. mental status unchanged from before CT. ABG ordered for A.M.per BiPAP Protocol, RT to monitor.

## 2020-07-05 NOTE — ED Notes (Signed)
Pt provided verbal consent to update pts niece on his condition.

## 2020-07-06 DIAGNOSIS — J9601 Acute respiratory failure with hypoxia: Secondary | ICD-10-CM

## 2020-07-06 DIAGNOSIS — J189 Pneumonia, unspecified organism: Secondary | ICD-10-CM

## 2020-07-06 LAB — BLOOD GAS, ARTERIAL
Acid-Base Excess: 10 mmol/L — ABNORMAL HIGH (ref 0.0–2.0)
Bicarbonate: 33.4 mmol/L — ABNORMAL HIGH (ref 20.0–28.0)
FIO2: 21
O2 Saturation: 89.2 %
Patient temperature: 98
pCO2 arterial: 38.5 mmHg (ref 32.0–48.0)
pH, Arterial: 7.545 — ABNORMAL HIGH (ref 7.350–7.450)
pO2, Arterial: 54.6 mmHg — ABNORMAL LOW (ref 83.0–108.0)

## 2020-07-06 LAB — CBC WITH DIFFERENTIAL/PLATELET
Abs Immature Granulocytes: 0.04 10*3/uL (ref 0.00–0.07)
Basophils Absolute: 0 10*3/uL (ref 0.0–0.1)
Basophils Relative: 0 %
Eosinophils Absolute: 0 10*3/uL (ref 0.0–0.5)
Eosinophils Relative: 0 %
HCT: 49.4 % (ref 39.0–52.0)
Hemoglobin: 15.8 g/dL (ref 13.0–17.0)
Immature Granulocytes: 0 %
Lymphocytes Relative: 4 %
Lymphs Abs: 0.6 10*3/uL — ABNORMAL LOW (ref 0.7–4.0)
MCH: 30.7 pg (ref 26.0–34.0)
MCHC: 32 g/dL (ref 30.0–36.0)
MCV: 95.9 fL (ref 80.0–100.0)
Monocytes Absolute: 0.4 10*3/uL (ref 0.1–1.0)
Monocytes Relative: 3 %
Neutro Abs: 12.1 10*3/uL — ABNORMAL HIGH (ref 1.7–7.7)
Neutrophils Relative %: 93 %
Platelets: 232 10*3/uL (ref 150–400)
RBC: 5.15 MIL/uL (ref 4.22–5.81)
RDW: 13.6 % (ref 11.5–15.5)
WBC: 13.1 10*3/uL — ABNORMAL HIGH (ref 4.0–10.5)
nRBC: 0 % (ref 0.0–0.2)

## 2020-07-06 LAB — AMMONIA: Ammonia: 19 umol/L (ref 9–35)

## 2020-07-06 LAB — COMPREHENSIVE METABOLIC PANEL
ALT: 35 U/L (ref 0–44)
AST: 29 U/L (ref 15–41)
Albumin: 3.5 g/dL (ref 3.5–5.0)
Alkaline Phosphatase: 41 U/L (ref 38–126)
Anion gap: 14 (ref 5–15)
BUN: 40 mg/dL — ABNORMAL HIGH (ref 8–23)
CO2: 32 mmol/L (ref 22–32)
Calcium: 9.4 mg/dL (ref 8.9–10.3)
Chloride: 89 mmol/L — ABNORMAL LOW (ref 98–111)
Creatinine, Ser: 1.19 mg/dL (ref 0.61–1.24)
GFR calc Af Amer: >60 mL/min (ref >60)
GFR calc non Af Amer: 58 mL/min — ABNORMAL LOW (ref >60)
Glucose, Bld: 146 mg/dL — ABNORMAL HIGH (ref 70–99)
Potassium: 4.1 mmol/L (ref 3.5–5.1)
Sodium: 135 mmol/L (ref 135–145)
Total Bilirubin: 0.6 mg/dL (ref 0.3–1.2)
Total Protein: 6.4 g/dL — ABNORMAL LOW (ref 6.5–8.1)

## 2020-07-06 LAB — TSH: TSH: 0.315 u[IU]/mL — ABNORMAL LOW (ref 0.350–4.500)

## 2020-07-06 LAB — MAGNESIUM: Magnesium: 1.7 mg/dL (ref 1.7–2.4)

## 2020-07-06 LAB — VITAMIN B12: Vitamin B-12: 502 pg/mL (ref 180–914)

## 2020-07-06 LAB — PROCALCITONIN: Procalcitonin: <0.1 ng/mL

## 2020-07-06 LAB — FOLATE: Folate: 7 ng/mL (ref >5.9)

## 2020-07-06 LAB — MRSA PCR SCREENING: MRSA by PCR: NEGATIVE

## 2020-07-06 MED ORDER — ORAL CARE MOUTH RINSE
15.0000 mL | Freq: Two times a day (BID) | OROMUCOSAL | Status: DC
  Administered 2020-07-06 – 2020-07-08 (×4): 15 mL via OROMUCOSAL

## 2020-07-06 MED ORDER — METHYLPREDNISOLONE SODIUM SUCC 40 MG IJ SOLR
40.0000 mg | Freq: Three times a day (TID) | INTRAMUSCULAR | Status: DC
  Administered 2020-07-06 – 2020-07-07 (×3): 40 mg via INTRAVENOUS
  Filled 2020-07-06 (×3): qty 1

## 2020-07-06 MED ORDER — CHLORHEXIDINE GLUCONATE CLOTH 2 % EX PADS
6.0000 | MEDICATED_PAD | Freq: Every day | CUTANEOUS | Status: DC
  Administered 2020-07-06 – 2020-07-09 (×4): 6 via TOPICAL

## 2020-07-06 MED ORDER — CHLORHEXIDINE GLUCONATE 0.12 % MT SOLN
15.0000 mL | Freq: Two times a day (BID) | OROMUCOSAL | Status: DC
  Administered 2020-07-06 – 2020-07-09 (×8): 15 mL via OROMUCOSAL
  Filled 2020-07-06 (×8): qty 15

## 2020-07-06 NOTE — Progress Notes (Signed)
Patient ID: Mike Gray, male   DOB: 09/29/1941, 79 y.o.   MRN: [id number]  PROGRESS NOTE    Mike Gray  DXA:[id number] DOB: 1941-07-18 DOA: 07/04/2020 PCP: Constance Haw, MD   Brief Narrative:  79 y.o. male with medical history significant for bipolar disorder, hypertrophic cardiomyopathy, history of nonsustained VT and complete heart block with ICD, atrial fibrillation on Eliquis, chronic systolic heart failure, COPD and history of GI bleed presented with altered mental status and increased fatigue on 07/04/2020.  On presentation, patient was somnolent and required BiPAP due to ABG showing PCO2 of 72 and pH of 7.3. CT chest showed diffuse mild airway thickening and scattered secretion with opacity in the right lower lobe that could be an early infectious inflammatory process.  There is also mild bilateral perinephric stranding that is nonspecific.  He was started on intravenous antibiotics and steroids.  Assessment & Plan:   Acute hypercapnic respiratory failure Probable community-acquired bacterial pneumonia COPD exacerbation -Required BiPAP on presentation due to ABG showing PCO2 of 72 and pH of 7.3.   -CT of the chest as above.  COVID-19 testing negative -Continue Rocephin and Zithromax.   -Decrease Solu-Medrol to 40 mg IV every 8 hours.  Continue duo nebs and Dulera -Currently on 3 L oxygen via nasal cannula.  Wean off as able.  ABG this morning shows PCO2 of 38.5.  No need for BiPAP at this time.  Acute metabolic encephalopathy -Probably from respiratory failure.  CT of the head without contrast showed no acute intracranial abnormality.   -Mental status has much improved: Still slightly confused.  Monitor mental status.  Fall precautions.  Chronic systolic heart failure -Strict input output.  Daily weights.  Fluid restriction.  Continue IV Lasix.  Monitor.  Echo shows EF of 40 to 45%  Paroxysmal A. Fib -Rate controlled.  Continue Eliquis  History of nonsustained V.  tach/hypertrophic cardiomyopathy/complete heart block status post dual-chamber ICD placement -Outpatient follow-up with cardiology/EP.  Currently rate controlled.  History of bipolar disorder -Continue Depakote  Generalized deconditioning - PT eval  DVT prophylaxis: Eliquis Code Status: DNR Family Communication: None at bedside Disposition Plan: Status is: Inpatient  Remains inpatient appropriate because:Inpatient level of care appropriate due to severity of illness   Dispo: The patient is from: Home              Anticipated d/c is to: Home              Anticipated d/c date is: 3 days              Patient currently is not medically stable to d/c.  Consultants: None  Procedures:  Echo as below  Antimicrobials: Rocephin and Zithromax from 07/04/2020 onwards   Subjective: Patient seen and examined at bedside.  Poor historian.  No overnight fever or vomiting reported.  Short of breath with even minimal exertion.  More awake and answers few questions appropriately but still slightly confused.  Objective: Vitals:   07/06/20 0100 07/06/20 0400 07/06/20 0600 07/06/20 0700  BP: 131/66  136/66 131/69  Pulse: 62  64 (!) 59  Resp: 15  16 (!) 29  Temp:  98.1 F (36.7 C)    TempSrc:  Oral    SpO2: 98%  97% 100%    Intake/Output Summary (Last 24 hours) at 07/06/2020 0713 Last data filed at 07/05/2020 2342 Gross per 24 hour  Intake 360 ml  Output --  Net 360 ml   There were no vitals filed for  this visit.  Examination:  General exam: No acute distress.  Chronically ill.  Currently on 3 L oxygen via nasal cannula ENT:  Currently off BiPAP Respiratory system: Bilateral decreased breath sounds at bases with some crackles.  Intermittently tachypneic Cardiovascular system: Rate controlled, S1-S2 heard Gastrointestinal system: Abdomen is nondistended, soft and nontender.  Bowel sounds heard  extremities: Mild lower extremity edema present.  No clubbing Central nervous system:  Poor historian. More awake and answers few questions appropriately but still slightly confused to time. No focal neurological deficits. Moving extremities Skin: No obvious ecchymosis/rashes Psychiatry: can not assess because of mental status    Data Reviewed: I have personally reviewed following labs and imaging studies  CBC: Recent Labs  Lab 07/04/20 1948 07/05/20 0422 07/06/20 0252  WBC 10.9* 8.6 13.1*  NEUTROABS 8.9*  --  12.1*  HGB 16.6 15.8 15.8  HCT 51.2 49.8 49.4  MCV 95.9 97.3 95.9  PLT 208 208 161   Basic Metabolic Panel: Recent Labs  Lab 07/04/20 1948 07/05/20 0422 07/06/20 0252  NA 140 139 135  K 4.1 4.5 4.1  CL 97* 94* 89*  CO2 31 34* 32  GLUCOSE 127* 116* 146*  BUN 32* 32* 40*  CREATININE 1.03 1.04 1.19  CALCIUM 9.9 9.4 9.4  MG  --   --  1.7   GFR: CrCl cannot be calculated (Unknown ideal weight.). Liver Function Tests: Recent Labs  Lab 07/04/20 1948 07/06/20 0252  AST 30 29  ALT 30 35  ALKPHOS 48 41  BILITOT 0.9 0.6  PROT 7.2 6.4*  ALBUMIN 4.1 3.5   No results for input(s): LIPASE, AMYLASE in the last 168 hours. Recent Labs  Lab 07/06/20 0252  AMMONIA 19   Coagulation Profile: No results for input(s): INR, PROTIME in the last 168 hours. Cardiac Enzymes: No results for input(s): CKTOTAL, CKMB, CKMBINDEX, TROPONINI in the last 168 hours. BNP (last 3 results) No results for input(s): PROBNP in the last 8760 hours. HbA1C: No results for input(s): HGBA1C in the last 72 hours. CBG: No results for input(s): GLUCAP in the last 168 hours. Lipid Profile: No results for input(s): CHOL, HDL, LDLCALC, TRIG, CHOLHDL, LDLDIRECT in the last 72 hours. Thyroid Function Tests: Recent Labs    07/06/20 0252  TSH 0.315*   Anemia Panel: Recent Labs    07/06/20 0252  VITAMINB12 502  FOLATE 7.0   Sepsis Labs: Recent Labs  Lab 07/06/20 0252  PROCALCITON <0.10    Recent Results (from the past 240 hour(s))  SARS Coronavirus 2 by RT PCR  (hospital order, performed in [facility] hospital lab) Nasopharyngeal Nasopharyngeal Swab     Status: None   Collection Time: 07/04/20  8:19 PM   Specimen: Nasopharyngeal Swab  Result Value Ref Range Status   SARS Coronavirus 2 NEGATIVE NEGATIVE Final    Comment: (NOTE) SARS-CoV-2 target nucleic acids are NOT DETECTED.  The SARS-CoV-2 RNA is generally detectable in upper and lower respiratory specimens during the acute phase of infection. The lowest concentration of SARS-CoV-2 viral copies this assay can detect is 250 copies / mL. A negative result does not preclude SARS-CoV-2 infection and should not be used as the sole basis for treatment or other patient management decisions.  A negative result may occur with improper specimen collection / handling, submission of specimen other than nasopharyngeal swab, presence of viral mutation(s) within the areas targeted by this assay, and inadequate number of viral copies (<250 copies / mL). A negative result must be combined  with clinical observations, patient history, and epidemiological information.  Fact Sheet for Patients:   StrictlyIdeas.no  Fact Sheet for Healthcare Providers: BankingDealers.co.za  This test is not yet approved or  cleared by the [place] FDA and has been authorized for detection and/or diagnosis of SARS-CoV-2 by FDA under an Emergency Use Authorization (EUA).  This EUA will remain in effect (meaning this test can be used) for the duration of the COVID-19 declaration under Section 564(b)(1) of the Act, 21 U.S.C. section 360bbb-3(b)(1), unless the authorization is terminated or revoked sooner.  Performed at [facility], [place] [address] [place], [place] [address]   MRSA PCR Screening     Status: None   Collection Time: 07/06/20 12:15 AM   Specimen: Nasal Mucosa; Nasopharyngeal  Result Value Ref Range Status   MRSA by PCR NEGATIVE NEGATIVE  Final    Comment:        The GeneXpert MRSA Assay (FDA approved for NASAL specimens only), is one component of a comprehensive MRSA colonization surveillance program. It is not intended to diagnose MRSA infection nor to guide or monitor treatment for MRSA infections. Performed at [facility], [place] [address], [place], [place] [address]          Radiology Studies: DG Chest 2 View  Result Date: 07/04/2020 CLINICAL DATA:  Fatigue EXAM: CHEST - 2 VIEW COMPARISON:  October 17, 2018 FINDINGS: There is a multi lead left-sided pacemaker/ICD in place. Heart size remains stable but enlarged. There is no pneumothorax. No large pleural effusion. There is an apparent new infrahilar opacity on the lateral view. A correlate abnormality on the frontal view is not well appreciated. IMPRESSION: Apparent new infrahilar opacity best visualized on the lateral view. This may represent an area of atelectasis, infiltrate, versus less likely adenopathy. Consider further evaluation with a contrast enhanced CT or a outpatient two-view chest x-ray in 4-6 weeks. Electronically Signed   By: Constance Holster M.D.   On: 07/04/2020 20:02   CT HEAD WO CONTRAST  Result Date: 07/05/2020 CLINICAL DATA:  Altered mental status with increased fatigue and shortness of breath. EXAM: CT HEAD WITHOUT CONTRAST TECHNIQUE: Contiguous axial images were obtained from the base of the skull through the vertex without intravenous contrast. COMPARISON:  None. FINDINGS: Brain: There is mild to moderate severity cerebral atrophy with widening of the extra-axial spaces and ventricular dilatation. There are areas of decreased attenuation within the white matter tracts of the supratentorial brain, consistent with microvascular disease changes. Vascular: No hyperdense vessel or unexpected calcification. Skull: Normal. Negative for fracture or focal lesion. Sinuses/Orbits: No acute finding. Other: None. IMPRESSION: 1.  Generalized cerebral atrophy. 2. No acute intracranial abnormality. Electronically Signed   By: Virgina Norfolk M.D.   On: 07/05/2020 02:29   CT Chest W Contrast  Result Date: 07/04/2020 CLINICAL DATA:  Abnormal chest radiograph, increasing fatigue and shortness of breath, recent medication changes EXAM: CT CHEST WITH CONTRAST TECHNIQUE: Multidetector CT imaging of the chest was performed during intravenous contrast administration. CONTRAST:  89mL OMNIPAQUE IOHEXOL 300 MG/ML  SOLN COMPARISON:  Chest radiograph 07/04/2020 FINDINGS: Cardiovascular: Cardiac size is upper limits normal. Three-vessel coronary artery calcifications are present. Calcifications also present on the mitral annulus and minimally on the aortic leaflets. Pacer/defibrillator leads are positioned at the right atrium and cardiac apex with a left chest wall battery pack and leads approaching via the left subclavian vein. Atherosclerotic plaque within the normal caliber aorta. No acute luminal abnormality of the imaged aorta.  No periaortic stranding or hemorrhage. Normal 3 vessel branching of the aortic arch. Proximal great vessels are mildly calcified but otherwise unremarkable. Central pulmonary arteries are upper limits normal caliber. No large central or lobar filling defects on this non tailored examination of the pulmonary arteries. Mediastinum/Nodes: No mediastinal fluid or gas. Normal thyroid gland and thoracic inlet. No acute abnormality of the trachea or esophagus. No worrisome mediastinal, hilar or axillary adenopathy. Lungs/Pleura: There is a background of moderate centrilobular emphysema demonstrating an apical predominance. Some atelectatic changes are present in the dependent portions of the lungs. There is diffuse mild airways thickening and scattered secretions with some more focal reticular opacity noted in the right lower lobe which corresponds well to the area developing opacity on chest radiography. No pneumothorax or  visible effusion. No convincing features of edema. Upper Abdomen: Calcified gallstones are present within the gallbladder body and neck with a prominent fold in the proximal gallbladder as well. No pericholecystic fluid or inflammation. No visible intraductal gallstones or biliary ductal dilatation. Small accessory splenules are noted. Mild bilateral perinephric stranding is nonspecific. Probable fluid attenuation cyst noted in the upper pole left kidney measuring up to 1.6 cm though incompletely included within the level of imaging. Musculoskeletal: Multilevel degenerative changes are present in the imaged portions of the spine. Additional mild degenerative changes in the shoulders. No acute osseous abnormality or suspicious osseous lesion. Left chest wall battery pack, as above. Small ovoid hypoattenuating intradermal lesion in the posterior soft tissues to the left of midline measuring up to 2.2 cm, likely reflecting a dermal inclusion cyst (2/114). No worrisome chest wall lesions. IMPRESSION: 1. Diffuse mild airways thickening and scattered secretions with some more focal reticular opacity in the right lower lobe which corresponds well to the area developing opacity on chest radiography. Could reflect a developing an early infectious/inflammatory process. Consider follow-up imaging in 3 months with noncontrast CT to assess for resolution. 2. Cholelithiasis without evidence of acute cholecystitis. 3. Mild bilateral perinephric stranding, nonspecific. Could correlate with age or diminished renal function. 4. Three-vessel coronary artery atherosclerosis. 5. Mitral annular and aortic leaflet calcifications. 6.  Aortic Atherosclerosis (ICD10-I70.0). 7. Emphysema (ICD10-J43.9). Electronically Signed   By: Lovena Le M.D.   On: 07/04/2020 22:44   CT ABDOMEN PELVIS W CONTRAST  Result Date: 07/05/2020 CLINICAL DATA:  Abdominal pain. EXAM: CT ABDOMEN AND PELVIS WITH CONTRAST TECHNIQUE: Multidetector CT imaging of  the abdomen and pelvis was performed using the standard protocol following bolus administration of intravenous contrast. CONTRAST:  164mL OMNIPAQUE IOHEXOL 300 MG/ML  SOLN COMPARISON:  None. FINDINGS: Lower chest: There is mild cardiomegaly. Hepatobiliary: No focal liver abnormality is seen. Subcentimeter gallstones are seen within the gallbladder lumen without evidence of gallbladder wall thickening or biliary dilatation. Pancreas: Unremarkable. No pancreatic ductal dilatation or surrounding inflammatory changes. Spleen: Normal in size without focal abnormality. Adrenals/Urinary Tract: Adrenal glands are unremarkable. Kidneys are normal in size, without renal calculi or hydronephrosis. A 1.7 cm simple cyst is seen within the posteromedial aspect of the mid right kidney. Contrast is seen within the lumen of a mildly distended urinary bladder. This is increased in attenuation within the dependent portion of the bladder. Stomach/Bowel: Stomach is within normal limits. Appendix appears normal. No evidence of bowel wall thickening, distention, or inflammatory changes. Noninflamed diverticula are seen throughout the large bowel. Vascular/Lymphatic: There is marked severity calcification of the abdominal aorta and bilateral common iliac arteries, without evidence of aneurysmal dilatation or dissection. No enlarged abdominal or pelvic  lymph nodes. Reproductive: The prostate gland is markedly enlarged. Other: No abdominal wall hernia or abnormality. No abdominopelvic ascites. Musculoskeletal: Multilevel degenerative changes seen throughout the lumbar spine. IMPRESSION: 1. Cholelithiasis without evidence of cholecystitis. 2. Noninflamed diverticula throughout the large bowel. 3. Markedly enlarged prostate gland. 4. Aortic atherosclerosis. 5. 1.7 cm right renal cyst. Aortic Atherosclerosis (ICD10-I70.0). Electronically Signed   By: Virgina Norfolk M.D.   On: 07/05/2020 02:36   ECHOCARDIOGRAM COMPLETE  Result Date:  07/05/2020    ECHOCARDIOGRAM REPORT   Patient Name:   EASTIN SWING Date of Exam: 07/05/2020 Medical Rec #:  [id number]     Height:       72.0 in Accession #:    [id number]    Weight:       199.8 lb Date of Birth:  Jul 24, 1941    BSA:          2.130 m Patient Age:    48 years      BP:           160/87 mmHg Patient Gender: M             HR:           53 bpm. Exam Location:  Inpatient Procedure: 2D Echo Indications:    Dyspnea R06.00  History:        Patient has prior history of Echocardiogram examinations, most                 recent 08/11/2011. CHF and Hypertrophic Cardiomyopathy,                 Pacemaker, COPD, Arrythmias:Atrial Fibrillation; Risk                 Factors:Hypertension.  Sonographer:    Mikki Santee RDCS (AE) Referring Phys: [id number] [place]  1. Left ventricular ejection fraction, by estimation, is 40 to 45%. The left ventricle has mildly decreased function. The left ventricle demonstrates global hypokinesis. There is moderate asymmetric left ventricular hypertrophy of the septal segment. Left ventricular diastolic function could not be evaluated.  2. Right ventricular systolic function is normal. The right ventricular size is normal. A n AICD wire is visualized.  3. The mitral valve is abnormal. Trivial mitral valve regurgitation.  4. The aortic valve is tricuspid. Aortic valve regurgitation is not visualized. Mild to moderate aortic valve sclerosis/calcification is present, without any evidence of aortic stenosis.  5. Aortic dilatation noted. There is mild dilatation of the ascending aorta, measuring 38 mm.  6. The inferior vena cava is dilated in size with >50% respiratory variability, suggesting right atrial pressure of 8 mmHg. Comparison(s): 08/11/2011: Report reviewed, LVEF reportedly normal at 65%. FINDINGS  Left Ventricle: Left ventricular ejection fraction, by estimation, is 40 to 45%. The left ventricle has mildly decreased function. The left ventricle demonstrates  global hypokinesis. The left ventricular internal cavity size was normal in size. There is  moderate asymmetric left ventricular hypertrophy of the septal segment. Abnormal (paradoxical) septal motion, consistent with RV pacemaker. Left ventricular diastolic function could not be evaluated due to atrial fibrillation. Left ventricular diastolic  function could not be evaluated. Right Ventricle: The right ventricular size is normal. No increase in right ventricular wall thickness. Right ventricular systolic function is normal. Left Atrium: Left atrial size was normal in size. Right Atrium: Right atrial size was normal in size. Pericardium: There is no evidence of pericardial effusion. Mitral Valve: The mitral valve is abnormal. Mild to moderate mitral annular calcification.  Trivial mitral valve regurgitation. Tricuspid Valve: The tricuspid valve is grossly normal. Tricuspid valve regurgitation is trivial. Aortic Valve: The aortic valve is tricuspid. Aortic valve regurgitation is not visualized. Mild to moderate aortic valve sclerosis/calcification is present, without any evidence of aortic stenosis. Pulmonic Valve: The pulmonic valve was normal in structure. Pulmonic valve regurgitation is not visualized. Aorta: Aortic dilatation noted. There is mild dilatation of the ascending aorta, measuring 38 mm. Venous: The inferior vena cava is dilated in size with greater than 50% respiratory variability, suggesting right atrial pressure of 8 mmHg. IAS/Shunts: No atrial level shunt detected by color flow Doppler. Additional Comments: A n AICD wire is visualized.  LEFT VENTRICLE PLAX 2D LVIDd:         4.92 cm LVIDs:         3.29 cm LV PW:         1.39 cm LV IVS:        1.39 cm LVOT diam:     2.00 cm LV SV:         72 LV SV Index:   34 LVOT Area:     3.14 cm  RIGHT VENTRICLE RV S prime:     13.20 cm/s TAPSE (M-mode): 1.6 cm LEFT ATRIUM              Index       RIGHT ATRIUM           Index LA diam:        3.90 cm  1.83 cm/m  RA  Area:     20.10 cm LA Vol (A2C):   104.0 ml 48.83 ml/m RA Volume:   52.80 ml  24.79 ml/m LA Vol (A4C):   53.4 ml  25.07 ml/m LA Biplane Vol: 76.3 ml  35.83 ml/m  AORTIC VALVE LVOT Vmax:   147.00 cm/s LVOT Vmean:  99.600 cm/s LVOT VTI:    0.230 m  AORTA Ao Root diam: 3.10 cm MITRAL VALVE MV Area (PHT): 2.34 cm     SHUNTS MV Decel Time: 324 msec     Systemic VTI:  0.23 m MV E velocity: 141.00 cm/s  Systemic Diam: 2.00 cm MV A velocity: 61.60 cm/s MV E/A ratio:  2.29 Lyman Bishop MD Electronically signed by Lyman Bishop MD Signature Date/Time: 07/05/2020/3:45:05 PM    Final         Scheduled Meds: . apixaban  5 mg Oral BID  . chlorhexidine  15 mL Mouth Rinse BID  . Chlorhexidine Gluconate Cloth  6 each Topical Daily  . divalproex  1,000 mg Oral Daily  . furosemide  40 mg Intravenous BID  . ipratropium-albuterol  3 mL Nebulization TID  . mouth rinse  15 mL Mouth Rinse q12n4p  . methylPREDNISolone (SOLU-MEDROL) injection  60 mg Intravenous Q8H  . methylPREDNISolone sodium succinate      . mometasone-formoterol  2 puff Inhalation BID   Continuous Infusions: . azithromycin    . cefTRIAXone (ROCEPHIN)  IV            Aline August, MD [facility] 07/06/2020, 7:13 AM

## 2020-07-06 NOTE — Progress Notes (Signed)
Pt. transported from ED-06 to ICU/SD w/o incident, scheduled to use BiPAP V-60 h/s, has ABG scheduled for 0550 07/06/2020, RT covering made aware.

## 2020-07-06 NOTE — Progress Notes (Signed)
RT Note: Pts. chart reviewed for BiPAP(NIV) order status, will remain @ bedside PRN pending scheduled A.M. ABG on current 3 lpm n/c.

## 2020-07-06 NOTE — Evaluation (Signed)
Physical Therapy Evaluation Patient Details Name: Mike Gray MRN: [id number] DOB: 1940-12-20 Today's Date: 07/06/2020   History of Present Illness  79 yo male admitted with AMS, acute  respiratory failure. Hx of bipolar d/o, cardiomyopathy, HB with ICD, A fib, HF, COPD  Clinical Impression  On eval, pt required Min assist +2 for mobility. He was able to stand and pivot over to recliner with 2 HHA. Very unsteady and at high risk for falls. Pt fatigues fairly easily as well. O2 93% on RA during session-replaced Bath O2 at end of session. Will plan to follow and progress activity as tolerated. At this time, recommendation is for ST SNF if pt/family are agreeable.     Follow Up Recommendations SNF    Equipment Recommendations  None recommended by PT    Recommendations for Other Services       Precautions / Restrictions Precautions Precautions: Fall Restrictions Weight Bearing Restrictions: No      Mobility  Bed Mobility Overal bed mobility: Needs Assistance Bed Mobility: Supine to Sit     Supine to sit: Min assist;HOB elevated     General bed mobility comments: Increased time. Assist to scoot to EOB.  Transfers Overall transfer level: Needs assistance Equipment used: 2 person hand held assist Transfers: Sit to/from [facility] Sit to Stand: Min assist;+2 physical assistance;+2 safety/equipment Stand pivot transfers: Min assist;+2 physical assistance;+2 safety/equipment       General transfer comment: Assist to power up, stabilize, control descent. Very unsteady with wide BOS. Stand pivot, bed to recliner.  Ambulation/Gait             General Gait Details: NT on today-will likely need RW  Stairs            Wheelchair Mobility    Modified Rankin (Stroke Patients Only)       Balance Overall balance assessment: Needs assistance         Standing balance support: Bilateral upper extremity supported Standing balance-Leahy Scale:  Poor                               Pertinent Vitals/Pain Pain Assessment: No/denies pain    Home Living Family/patient expects to be discharged to:: Unsure Living Arrangements: Other relatives (brother) Available Help at Discharge: Family Type of Home: House         Home Equipment: None      Prior Function Level of Independence: Independent               Hand Dominance        Extremity/Trunk Assessment   Upper Extremity Assessment Upper Extremity Assessment: Generalized weakness    Lower Extremity Assessment Lower Extremity Assessment: Generalized weakness    Cervical / Trunk Assessment Cervical / Trunk Assessment: Normal  Communication   Communication: No difficulties  Cognition Arousal/Alertness: Awake/alert Behavior During Therapy: WFL for tasks assessed/performed Overall Cognitive Status: No family/caregiver present to determine baseline cognitive functioning                                        General Comments      Exercises     Assessment/Plan    PT Assessment Patient needs continued PT services  PT Problem List Decreased strength;Decreased mobility;Decreased activity tolerance;Decreased balance;Decreased knowledge of use of DME       PT Treatment Interventions  DME instruction;Gait training;Therapeutic activities;Therapeutic exercise;Patient/family education;Balance training;Functional mobility training    PT Goals (Current goals can be found in the Care Plan section)  Acute Rehab PT Goals Patient Stated Goal: to get stronger PT Goal Formulation: With patient Time For Goal Achievement: 07/20/20 Potential to Achieve Goals: Good    Frequency Min 3X/week   Barriers to discharge Decreased caregiver support      Co-evaluation               AM-PAC PT "6 Clicks" Mobility  Outcome Measure Help needed turning from your back to your side while in a flat bed without using bedrails?: A Little Help  needed moving from lying on your back to sitting on the side of a flat bed without using bedrails?: A Little Help needed moving to and from a bed to a chair (including a wheelchair)?: A Lot Help needed standing up from a chair using your arms (e.g., wheelchair or bedside chair)?: A Lot Help needed to walk in hospital room?: A Lot Help needed climbing 3-5 steps with a railing? : A Lot 6 Click Score: 14    End of Session Equipment Utilized During Treatment: Gait belt Activity Tolerance: Patient tolerated treatment well Patient left: in chair;with call bell/phone within reach;with chair alarm set   PT Visit Diagnosis: Muscle weakness (generalized) (M62.81);Difficulty in walking, not elsewhere classified (R26.2)    Time: 1416-1440 PT Time Calculation (min) (ACUTE ONLY): 24 min   Charges:   PT Evaluation $PT Eval Low Complexity: 1 Low PT Treatments $Gait Training: 8-22 mins          Doreatha Massed, PT Acute Rehabilitation  Office: [phone] Pager: [phone]

## 2020-07-06 NOTE — Progress Notes (Signed)
  Speech Language Pathology Treatment: Dysphagia  Patient Details Name: Mike Gray MRN: [id number] DOB: 29-Sep-1941 Today's Date: 07/06/2020 Time: 8657-8469 SLP Time Calculation (min) (ACUTE ONLY): 29 min  Assessment / Plan / Recommendation Clinical Impression  Pt seen to assess po tolerance and readiness for diet advancement. Today pt fully alert, no increased work of breathing, sneezing, gagging or coughing noted during session with intake of entire snack of graham crackers, applesauce, water and 4 ounces juice.  Per pt's brother, pt eats very slowly normally.  Effective mastication with no oral retention and timely swallow via observation of laryngeal elevation/closure.  Recommend advance to regular consistency and thin liquids.  Given secretions noted in airway,  level of swallowing difficulty yesterday and brother report of dysphagia occurring intermittently at home, will follow up one more time later in the week to assure pt is tolerating dietary advancement.  Pt and brother deny pt having vomiting or AMS to point of lethargy PTA, causing some concern for possible episodic aspiration.       HPI HPI: 79 yo male adm to [facility] with decline in function x2 weeks, not taking medications x1 week and return to smoking.  Pt PMH + for COPD, bipolar, GI bleed, CHF, hypertrophic cardiomyopathy, Afib.  Pt imaging study 07/04/2020 showed right LL opacity with scattered secretions in airway concerning for early infectious inflammatory process.  Per MD notes, pt without vomiting over night.  Imaging of abdomen 07/05/2020 showed Cholelithiasis without evidence of cholecystitis.2. Noninflamed diverticula throughout the large bowel. 3. Enlarged prostate Swallow eval ordered. Follow up indicated after pt's issues with overtly coughing, sneezing, expectorating with jello yesterday and due to his pna.  Pt alert, cooperative and is desiring solid foods.  [name] Kitchen      SLP Plan  Continue with current plan of care        Recommendations  Diet recommendations: Regular;Thin liquid Liquids provided via: Straw;Cup Medication Administration: Whole meds with liquid Compensations: Slow rate;Small sips/bites Postural Changes and/or Swallow Maneuvers: Seated upright 90 degrees;Upright 30-60 min after meal                Oral Care Recommendations: Oral care QID SLP Visit Diagnosis: Dysphagia, pharyngoesophageal phase (R13.14);Dysphagia, unspecified (R13.10) Plan: Continue with current plan of care       GO                Macario Golds 07/06/2020, 11:50 AM  [name], MS [place] Office [phone]

## 2020-07-07 ENCOUNTER — Other Ambulatory Visit: Payer: Self-pay

## 2020-07-07 ENCOUNTER — Encounter: Payer: Medicare Other | Admitting: Student

## 2020-07-07 DIAGNOSIS — I5043 Acute on chronic combined systolic (congestive) and diastolic (congestive) heart failure: Secondary | ICD-10-CM

## 2020-07-07 DIAGNOSIS — I422 Other hypertrophic cardiomyopathy: Secondary | ICD-10-CM

## 2020-07-07 DIAGNOSIS — Z95810 Presence of automatic (implantable) cardiac defibrillator: Secondary | ICD-10-CM

## 2020-07-07 LAB — CBC WITH DIFFERENTIAL/PLATELET
Abs Immature Granulocytes: 0.08 10*3/uL — ABNORMAL HIGH (ref 0.00–0.07)
Basophils Absolute: 0 10*3/uL (ref 0.0–0.1)
Basophils Relative: 0 %
Eosinophils Absolute: 0 10*3/uL (ref 0.0–0.5)
Eosinophils Relative: 0 %
HCT: 46.7 % (ref 39.0–52.0)
Hemoglobin: 15.8 g/dL (ref 13.0–17.0)
Immature Granulocytes: 0 %
Lymphocytes Relative: 3 %
Lymphs Abs: 0.5 10*3/uL — ABNORMAL LOW (ref 0.7–4.0)
MCH: 31.2 pg (ref 26.0–34.0)
MCHC: 33.8 g/dL (ref 30.0–36.0)
MCV: 92.1 fL (ref 80.0–100.0)
Monocytes Absolute: 0.7 10*3/uL (ref 0.1–1.0)
Monocytes Relative: 4 %
Neutro Abs: 17.1 10*3/uL — ABNORMAL HIGH (ref 1.7–7.7)
Neutrophils Relative %: 93 %
Platelets: 231 10*3/uL (ref 150–400)
RBC: 5.07 MIL/uL (ref 4.22–5.81)
RDW: 14.1 % (ref 11.5–15.5)
WBC: 18.4 10*3/uL — ABNORMAL HIGH (ref 4.0–10.5)
nRBC: 0 % (ref 0.0–0.2)

## 2020-07-07 LAB — BASIC METABOLIC PANEL
Anion gap: 20 — ABNORMAL HIGH (ref 5–15)
BUN: 60 mg/dL — ABNORMAL HIGH (ref 8–23)
CO2: 28 mmol/L (ref 22–32)
Calcium: 9.5 mg/dL (ref 8.9–10.3)
Chloride: 90 mmol/L — ABNORMAL LOW (ref 98–111)
Creatinine, Ser: 1.43 mg/dL — ABNORMAL HIGH (ref 0.61–1.24)
GFR calc Af Amer: 54 mL/min — ABNORMAL LOW (ref >60)
GFR calc non Af Amer: 47 mL/min — ABNORMAL LOW (ref >60)
Glucose, Bld: 133 mg/dL — ABNORMAL HIGH (ref 70–99)
Potassium: 3.9 mmol/L (ref 3.5–5.1)
Sodium: 138 mmol/L (ref 135–145)

## 2020-07-07 LAB — BLOOD GAS, ARTERIAL
Acid-Base Excess: 8 mmol/L — ABNORMAL HIGH (ref 0.0–2.0)
Bicarbonate: 30.5 mmol/L — ABNORMAL HIGH (ref 20.0–28.0)
O2 Saturation: 93.6 %
Patient temperature: 97.7
pCO2 arterial: 34.3 mmHg (ref 32.0–48.0)
pH, Arterial: 7.555 — ABNORMAL HIGH (ref 7.350–7.450)
pO2, Arterial: 64.1 mmHg — ABNORMAL LOW (ref 83.0–108.0)

## 2020-07-07 LAB — MAGNESIUM: Magnesium: 1.6 mg/dL — ABNORMAL LOW (ref 1.7–2.4)

## 2020-07-07 MED ORDER — AMIODARONE HCL IN DEXTROSE 360-4.14 MG/200ML-% IV SOLN
60.0000 mg/h | INTRAVENOUS | Status: AC
  Administered 2020-07-07: 60 mg/h via INTRAVENOUS
  Filled 2020-07-07: qty 200

## 2020-07-07 MED ORDER — AMIODARONE LOAD VIA INFUSION
150.0000 mg | Freq: Once | INTRAVENOUS | Status: AC
  Administered 2020-07-07: 150 mg via INTRAVENOUS
  Filled 2020-07-07: qty 83.34

## 2020-07-07 MED ORDER — AMIODARONE HCL IN DEXTROSE 360-4.14 MG/200ML-% IV SOLN
30.0000 mg/h | INTRAVENOUS | Status: DC
  Administered 2020-07-07 – 2020-07-09 (×4): 30 mg/h via INTRAVENOUS
  Filled 2020-07-07 (×2): qty 200
  Filled 2020-07-07: qty 400

## 2020-07-07 MED ORDER — MAGNESIUM SULFATE 2 GM/50ML IV SOLN
2.0000 g | Freq: Once | INTRAVENOUS | Status: AC
  Administered 2020-07-07: 2 g via INTRAVENOUS
  Filled 2020-07-07: qty 50

## 2020-07-07 MED ORDER — METHYLPREDNISOLONE SODIUM SUCC 40 MG IJ SOLR
40.0000 mg | Freq: Every day | INTRAMUSCULAR | Status: DC
  Administered 2020-07-08 – 2020-07-09 (×2): 40 mg via INTRAVENOUS
  Filled 2020-07-07 (×2): qty 1

## 2020-07-07 MED ORDER — METHYLPREDNISOLONE SODIUM SUCC 40 MG IJ SOLR: 40.0000 mg | Freq: Two times a day (BID) | INTRAMUSCULAR | Status: DC

## 2020-07-07 MED ORDER — TAMSULOSIN HCL 0.4 MG PO CAPS
0.4000 mg | ORAL_CAPSULE | Freq: Every day | ORAL | Status: DC
  Administered 2020-07-07 – 2020-07-09 (×3): 0.4 mg via ORAL
  Filled 2020-07-07 (×4): qty 1

## 2020-07-07 MED ORDER — SENNOSIDES-DOCUSATE SODIUM 8.6-50 MG PO TABS: 1.0000 | ORAL_TABLET | Freq: Every evening | ORAL | Status: DC | PRN

## 2020-07-07 MED ORDER — POLYETHYLENE GLYCOL 3350 17 G PO PACK
17.0000 g | PACK | Freq: Every day | ORAL | Status: DC
  Administered 2020-07-07 – 2020-07-08 (×2): 17 g via ORAL
  Filled 2020-07-07 (×3): qty 1

## 2020-07-07 NOTE — Consult Note (Addendum)
Cardiology Consultation:   Patient ID: Mike Gray MRN: [id number]; DOB: [birth year]  Admit date: 07/04/2020 Date of Consult: 07/07/2020  Primary Care Provider: Constance Haw, MD [facility] Cardiologist: Will [name], MD  [facility] Electrophysiologist:  None    Patient Profile:   Mike Gray is a 79 y.o. male with a hx of HCM, NSVT, CHB s/p ICD (VT and HCM), PAF on eliquis, HTN, and COPD who is being seen today for the evaluation of CHF at the request of Dr. Bonner Puna.  History of Present Illness:   Mike Gray follows with [name] for cardiology care and was last seen in clinic 10/13/19 and was doing well at that time. He is maintained on eliquis for stroke PPX, lipitor 20 gm, losartan-HCTZ, and 100 mg toprol. Device download 02/2020 with multiple NSVT episodes, increased from prior. Toprol was increased to 200 mg. Unfortunately he was unable to tolerate this dose of toprol and reduced back to 100 mg daily. The next remote transmission showed persistent atrial flutter and multiple (43) episodes of NSVT. Pt was scheduled to see [name] in [place] [facility] on 07/07/20, but instead presented to [facility] with weakness/fatigue and dyspnea. He presented with his brother who is his primary care giver (bipolar disorder). Brother brought him in due to a decline in his condition for the past 2 weeks. Pt found to start smoking again after years of abstinence and increased somnolence.  In addition, patient has been incontinent to urine and stool. On arrival, he was hypoxic with O2 83% on room air. He required BiPAP. COVID-19 negative. CT head negative for acute abnormalities. CT chest with infection/inflammation. BNP was elevated to 795. ABG with respiratory acidosis.     Echocardiogram showed EF of 40-45%, previously normal-hyperdynamic, asymmetric LV hypertrophy, normal RV, aortic dilation of 3.8 cm, and elevated right atrial pressure. Cardiology was consulted.  On my interview, he is sitting  up comfortably on room air. Brother is at bedside and helps with history. States that he noticed increased exertional dyspnea about 2 weeks ago. Pt has also been waking in the middle of the night after only 4 hrs of sleep. Pt denies that this is due to PND or chest pain. On other days, he is sleeping excessively and sometimes doesn't get out of bed for the Mike, which is unusual for him. He denies orthopnea and SOB, lower extremity swelling and syncope. He denies chest pain. Pt is adamant that he is now taking 200 mg toprol, although I'm unsure for how long - possibly the last three weeks. Pt was brought to ER by his brother after pulse ox was 83% at home.    Past Medical History:  Diagnosis Date  . Angiodysplasia   . Atrial fibrillation ([place])   . CHF (congestive heart failure) ([place])   . COPD (chronic obstructive pulmonary disease) ([place])   . GI bleed   . Hypertension     Past Surgical History:  Procedure Laterality Date  . COLONOSCOPY    . PACEMAKER IMPLANT       Home Medications:  Prior to Admission medications   Medication Sig Start Date End Date Taking? Authorizing Provider  apixaban (ELIQUIS) 5 MG TABS tablet Take 1 tablet (5 mg total) by mouth 2 (two) times daily. 10/13/19  Yes Camnitz, Will Hassell Done, MD  atorvastatin (LIPITOR) 20 MG tablet Take 20 mg by mouth daily.   Yes [provider]  divalproex (DEPAKOTE ER) 500 MG 24 hr tablet Take 1,000 mg by mouth daily.  10/20/19  Yes [provider]  losartan-hydrochlorothiazide (HYZAAR) 100-12.5 MG tablet Take 1 tablet by mouth daily.   Yes [provider]  metoprolol succinate (TOPROL-XL) 200 MG 24 hr tablet Take 1 tablet (200 mg total) by mouth daily. Take with or immediately following a meal. 02/27/20  Yes Camnitz, Ocie Doyne, MD    Inpatient Medications: Scheduled Meds: . apixaban  5 mg Oral BID  . chlorhexidine  15 mL Mouth Rinse BID  . Chlorhexidine Gluconate Cloth  6 each Topical Daily  . divalproex   1,000 mg Oral Daily  . ipratropium-albuterol  3 mL Nebulization TID  . mouth rinse  15 mL Mouth Rinse q12n4p  . methylPREDNISolone (SOLU-MEDROL) injection  40 mg Intravenous Q12H  . mometasone-formoterol  2 puff Inhalation BID  . tamsulosin  0.4 mg Oral QPC supper   Continuous Infusions: . azithromycin Stopped (07/06/20 2315)  . cefTRIAXone (ROCEPHIN)  IV Stopped (07/06/20 2250)   PRN Meds: albuterol  Allergies:   No Known Allergies  Social History:   Social History   Socioeconomic History  . Marital status: Divorced    Spouse name: Not on file  . Number of children: Not on file  . Years of education: Not on file  . Highest education level: Not on file  Occupational History  . Not on file  Tobacco Use  . Smoking status: Former Smoker    Types: Cigarettes    Quit date: 2013    Years since quitting: 8.7  . Smokeless tobacco: Never Used  Vaping Use  . Vaping Use: Never used  Substance and Sexual Activity  . Alcohol use: Not Currently  . Drug use: Never  . Sexual activity: Not on file  Other Topics Concern  . Not on file  Social History Narrative  . Not on file   Social Determinants of Health   Financial Resource Strain:   . Difficulty of Paying Living Expenses: Not on file  Food Insecurity:   . Worried About Charity fundraiser in the Last Year: Not on file  . Ran Out of Food in the Last Year: Not on file  Transportation Needs:   . Lack of Transportation (Medical): Not on file  . Lack of Transportation (Non-Medical): Not on file  Physical Activity:   . Days of Exercise per Week: Not on file  . Minutes of Exercise per Session: Not on file  Stress:   . Feeling of Stress : Not on file  Social Connections:   . Frequency of Communication with Friends and Family: Not on file  . Frequency of Social Gatherings with Friends and Family: Not on file  . Attends Religious Services: Not on file  . Active Member of Clubs or Organizations: Not on file  . Attends Theatre manager Meetings: Not on file  . Marital Status: Not on file  Intimate Partner Violence:   . Fear of Current or Ex-Partner: Not on file  . Emotionally Abused: Not on file  . Physically Abused: Not on file  . Sexually Abused: Not on file    Family History:    Family History  Problem Relation Age of Onset  . Healthy Mother   . Breast cancer Father 14  . Cancer Other      ROS:  Please see the history of present illness.   All other ROS reviewed and negative.     Physical Exam/Data:   Vitals:   07/07/20 0600 07/07/20 0821 07/07/20 0900 07/07/20 1000  BP: [name] Kitchen)  143/63     Pulse: (!) 57  66 60  Resp:      Temp:  97.9 F (36.6 C)    TempSrc:  Oral    SpO2: (!) 88%  94% 94%  Weight:        Intake/Output Summary (Last 24 hours) at 07/07/2020 1112 Last data filed at 07/07/2020 0600 Gross per 24 hour  Intake --  Output 1050 ml  Net -1050 ml   Last 3 Weights 07/07/2020 10/13/2019 08/13/2018  Weight (lbs) 171 lb 1.2 oz 199 lb 12.8 oz 192 lb  Weight (kg) 77.6 kg 90.629 kg 87.091 kg     Body mass index is 23.2 kg/m.  General:  Obese male in no acute distress HEENT: normal Neck: mild JVD Vascular: No carotid bruits  Cardiac:  Irregular rhythm, regular rate Lungs:  Respirations unlabored Abd: soft, nontender, no hepatomegaly  Ext: no edema Musculoskeletal:  No deformities, BUE and BLE strength normal and equal Skin: warm and dry  Neuro:  CNs 2-12 intact, no focal abnormalities noted Psych:  Normal affect   EKG:  The EKG was personally reviewed and demonstrates:  Paced rhythm with PVC Telemetry:  Telemetry was personally reviewed and demonstrates:  Appears to be atrial flutter with PVCs and NSVT  Relevant CV Studies:  Echo 07/05/20: 1. Left ventricular ejection fraction, by estimation, is 40 to 45%. The  left ventricle has mildly decreased function. The left ventricle  demonstrates global hypokinesis. There is moderate asymmetric left  ventricular hypertrophy of  the septal segment.  Left ventricular diastolic function could not be evaluated.  2. Right ventricular systolic function is normal. The right ventricular  size is normal. A n AICD wire is visualized.  3. The mitral valve is abnormal. Trivial mitral valve regurgitation.  4. The aortic valve is tricuspid. Aortic valve regurgitation is not  visualized. Mild to moderate aortic valve sclerosis/calcification is  present, without any evidence of aortic stenosis.  5. Aortic dilatation noted. There is mild dilatation of the ascending  aorta, measuring 38 mm.  6. The inferior vena cava is dilated in size with >50% respiratory  variability, suggesting right atrial pressure of 8 mmHg.   Laboratory Data:  High Sensitivity Troponin:  No results for input(s): TROPONINIHS in the last 720 hours.   Chemistry Recent Labs  Lab 07/05/20 0422 07/06/20 0252 07/07/20 0258  NA 139 135 138  K 4.5 4.1 3.9  CL 94* 89* 90*  CO2 34* 32 28  GLUCOSE 116* 146* 133*  BUN 32* 40* 60*  CREATININE 1.04 1.19 1.43*  CALCIUM 9.4 9.4 9.5  GFRNONAA >60 58* 47*  GFRAA >60 >60 54*  ANIONGAP 11 14 20*    Recent Labs  Lab 07/04/20 1948 07/06/20 0252  PROT 7.2 6.4*  ALBUMIN 4.1 3.5  AST 30 29  ALT 30 35  ALKPHOS 48 41  BILITOT 0.9 0.6   Hematology Recent Labs  Lab 07/05/20 0422 07/06/20 0252 07/07/20 0258  WBC 8.6 13.1* 18.4*  RBC 5.12 5.15 5.07  HGB 15.8 15.8 15.8  HCT 49.8 49.4 46.7  MCV 97.3 95.9 92.1  MCH 30.9 30.7 31.2  MCHC 31.7 32.0 33.8  RDW 14.0 13.6 14.1  PLT 208 232 231   BNP Recent Labs  Lab 07/04/20 1948  BNP 794.8*    DDimer No results for input(s): DDIMER in the last 168 hours.   Radiology/Studies:  DG Chest 2 View  Result Date: 07/04/2020 CLINICAL DATA:  Fatigue EXAM: CHEST - 2 VIEW  COMPARISON:  October 17, 2018 FINDINGS: There is a multi lead left-sided pacemaker/ICD in place. Heart size remains stable but enlarged. There is no pneumothorax. No large pleural effusion.  There is an apparent new infrahilar opacity on the lateral view. A correlate abnormality on the frontal view is not well appreciated. IMPRESSION: Apparent new infrahilar opacity best visualized on the lateral view. This may represent an area of atelectasis, infiltrate, versus less likely adenopathy. Consider further evaluation with a contrast enhanced CT or a outpatient two-view chest x-ray in 4-6 weeks. Electronically Signed   By: Constance Holster M.D.   On: 07/04/2020 20:02   CT HEAD WO CONTRAST  Result Date: 07/05/2020 CLINICAL DATA:  Altered mental status with increased fatigue and shortness of breath. EXAM: CT HEAD WITHOUT CONTRAST TECHNIQUE: Contiguous axial images were obtained from the base of the skull through the vertex without intravenous contrast. COMPARISON:  None. FINDINGS: Brain: There is mild to moderate severity cerebral atrophy with widening of the extra-axial spaces and ventricular dilatation. There are areas of decreased attenuation within the white matter tracts of the supratentorial brain, consistent with microvascular disease changes. Vascular: No hyperdense vessel or unexpected calcification. Skull: Normal. Negative for fracture or focal lesion. Sinuses/Orbits: No acute finding. Other: None. IMPRESSION: 1. Generalized cerebral atrophy. 2. No acute intracranial abnormality. Electronically Signed   By: Virgina Norfolk M.D.   On: 07/05/2020 02:29   CT Chest W Contrast  Result Date: 07/04/2020 CLINICAL DATA:  Abnormal chest radiograph, increasing fatigue and shortness of breath, recent medication changes EXAM: CT CHEST WITH CONTRAST TECHNIQUE: Multidetector CT imaging of the chest was performed during intravenous contrast administration. CONTRAST:  61mL OMNIPAQUE IOHEXOL 300 MG/ML  SOLN COMPARISON:  Chest radiograph 07/04/2020 FINDINGS: Cardiovascular: Cardiac size is upper limits normal. Three-vessel coronary artery calcifications are present. Calcifications also present on the  mitral annulus and minimally on the aortic leaflets. Pacer/defibrillator leads are positioned at the right atrium and cardiac apex with a left chest wall battery pack and leads approaching via the left subclavian vein. Atherosclerotic plaque within the normal caliber aorta. No acute luminal abnormality of the imaged aorta. No periaortic stranding or hemorrhage. Normal 3 vessel branching of the aortic arch. Proximal great vessels are mildly calcified but otherwise unremarkable. Central pulmonary arteries are upper limits normal caliber. No large central or lobar filling defects on this non tailored examination of the pulmonary arteries. Mediastinum/Nodes: No mediastinal fluid or gas. Normal thyroid gland and thoracic inlet. No acute abnormality of the trachea or esophagus. No worrisome mediastinal, hilar or axillary adenopathy. Lungs/Pleura: There is a background of moderate centrilobular emphysema demonstrating an apical predominance. Some atelectatic changes are present in the dependent portions of the lungs. There is diffuse mild airways thickening and scattered secretions with some more focal reticular opacity noted in the right lower lobe which corresponds well to the area developing opacity on chest radiography. No pneumothorax or visible effusion. No convincing features of edema. Upper Abdomen: Calcified gallstones are present within the gallbladder body and neck with a prominent fold in the proximal gallbladder as well. No pericholecystic fluid or inflammation. No visible intraductal gallstones or biliary ductal dilatation. Small accessory splenules are noted. Mild bilateral perinephric stranding is nonspecific. Probable fluid attenuation cyst noted in the upper pole left kidney measuring up to 1.6 cm though incompletely included within the level of imaging. Musculoskeletal: Multilevel degenerative changes are present in the imaged portions of the spine. Additional mild degenerative changes in the shoulders.  No acute osseous abnormality  or suspicious osseous lesion. Left chest wall battery pack, as above. Small ovoid hypoattenuating intradermal lesion in the posterior soft tissues to the left of midline measuring up to 2.2 cm, likely reflecting a dermal inclusion cyst (2/114). No worrisome chest wall lesions. IMPRESSION: 1. Diffuse mild airways thickening and scattered secretions with some more focal reticular opacity in the right lower lobe which corresponds well to the area developing opacity on chest radiography. Could reflect a developing an early infectious/inflammatory process. Consider follow-up imaging in 3 months with noncontrast CT to assess for resolution. 2. Cholelithiasis without evidence of acute cholecystitis. 3. Mild bilateral perinephric stranding, nonspecific. Could correlate with age or diminished renal function. 4. Three-vessel coronary artery atherosclerosis. 5. Mitral annular and aortic leaflet calcifications. 6.  Aortic Atherosclerosis (ICD10-I70.0). 7. Emphysema (ICD10-J43.9). Electronically Signed   By: Lovena Le M.D.   On: 07/04/2020 22:44   CT ABDOMEN PELVIS W CONTRAST  Result Date: 07/05/2020 CLINICAL DATA:  Abdominal pain. EXAM: CT ABDOMEN AND PELVIS WITH CONTRAST TECHNIQUE: Multidetector CT imaging of the abdomen and pelvis was performed using the standard protocol following bolus administration of intravenous contrast. CONTRAST:  141mL OMNIPAQUE IOHEXOL 300 MG/ML  SOLN COMPARISON:  None. FINDINGS: Lower chest: There is mild cardiomegaly. Hepatobiliary: No focal liver abnormality is seen. Subcentimeter gallstones are seen within the gallbladder lumen without evidence of gallbladder wall thickening or biliary dilatation. Pancreas: Unremarkable. No pancreatic ductal dilatation or surrounding inflammatory changes. Spleen: Normal in size without focal abnormality. Adrenals/Urinary Tract: Adrenal glands are unremarkable. Kidneys are normal in size, without renal calculi or hydronephrosis.  A 1.7 cm simple cyst is seen within the posteromedial aspect of the mid right kidney. Contrast is seen within the lumen of a mildly distended urinary bladder. This is increased in attenuation within the dependent portion of the bladder. Stomach/Bowel: Stomach is within normal limits. Appendix appears normal. No evidence of bowel wall thickening, distention, or inflammatory changes. Noninflamed diverticula are seen throughout the large bowel. Vascular/Lymphatic: There is marked severity calcification of the abdominal aorta and bilateral common iliac arteries, without evidence of aneurysmal dilatation or dissection. No enlarged abdominal or pelvic lymph nodes. Reproductive: The prostate gland is markedly enlarged. Other: No abdominal wall hernia or abnormality. No abdominopelvic ascites. Musculoskeletal: Multilevel degenerative changes seen throughout the lumbar spine. IMPRESSION: 1. Cholelithiasis without evidence of cholecystitis. 2. Noninflamed diverticula throughout the large bowel. 3. Markedly enlarged prostate gland. 4. Aortic atherosclerosis. 5. 1.7 cm right renal cyst. Aortic Atherosclerosis (ICD10-I70.0). Electronically Signed   By: Virgina Norfolk M.D.   On: 07/05/2020 02:36   ECHOCARDIOGRAM COMPLETE  Result Date: 07/05/2020    ECHOCARDIOGRAM REPORT   Patient Name:   Mike Gray Date of Exam: 07/05/2020 Medical Rec #:  [id number]     Height:       72.0 in Accession #:    [id number]    Weight:       199.8 lb Date of Birth:  [birth year]    BSA:          2.130 m Patient Age:    89 years      BP:           160/87 mmHg Patient Gender: M             HR:           53 bpm. Exam Location:  Inpatient Procedure: 2D Echo Indications:    Dyspnea R06.00  History:        Patient has prior history of Echocardiogram  examinations, most                 recent 08/11/2011. CHF and Hypertrophic Cardiomyopathy,                 Pacemaker, COPD, Arrythmias:Atrial Fibrillation; Risk                 Factors:Hypertension.   Sonographer:    Mikki Santee RDCS (AE) Referring Phys: [id number] [place]  1. Left ventricular ejection fraction, by estimation, is 40 to 45%. The left ventricle has mildly decreased function. The left ventricle demonstrates global hypokinesis. There is moderate asymmetric left ventricular hypertrophy of the septal segment. Left ventricular diastolic function could not be evaluated.  2. Right ventricular systolic function is normal. The right ventricular size is normal. A n AICD wire is visualized.  3. The mitral valve is abnormal. Trivial mitral valve regurgitation.  4. The aortic valve is tricuspid. Aortic valve regurgitation is not visualized. Mild to moderate aortic valve sclerosis/calcification is present, without any evidence of aortic stenosis.  5. Aortic dilatation noted. There is mild dilatation of the ascending aorta, measuring 38 mm.  6. The inferior vena cava is dilated in size with >50% respiratory variability, suggesting right atrial pressure of 8 mmHg. Comparison(s): 08/11/2011: Report reviewed, LVEF reportedly normal at 65%. FINDINGS  Left Ventricle: Left ventricular ejection fraction, by estimation, is 40 to 45%. The left ventricle has mildly decreased function. The left ventricle demonstrates global hypokinesis. The left ventricular internal cavity size was normal in size. There is  moderate asymmetric left ventricular hypertrophy of the septal segment. Abnormal (paradoxical) septal motion, consistent with RV pacemaker. Left ventricular diastolic function could not be evaluated due to atrial fibrillation. Left ventricular diastolic  function could not be evaluated. Right Ventricle: The right ventricular size is normal. No increase in right ventricular wall thickness. Right ventricular systolic function is normal. Left Atrium: Left atrial size was normal in size. Right Atrium: Right atrial size was normal in size. Pericardium: There is no evidence of pericardial effusion. Mitral  Valve: The mitral valve is abnormal. Mild to moderate mitral annular calcification. Trivial mitral valve regurgitation. Tricuspid Valve: The tricuspid valve is grossly normal. Tricuspid valve regurgitation is trivial. Aortic Valve: The aortic valve is tricuspid. Aortic valve regurgitation is not visualized. Mild to moderate aortic valve sclerosis/calcification is present, without any evidence of aortic stenosis. Pulmonic Valve: The pulmonic valve was normal in structure. Pulmonic valve regurgitation is not visualized. Aorta: Aortic dilatation noted. There is mild dilatation of the ascending aorta, measuring 38 mm. Venous: The inferior vena cava is dilated in size with greater than 50% respiratory variability, suggesting right atrial pressure of 8 mmHg. IAS/Shunts: No atrial level shunt detected by color flow Doppler. Additional Comments: A n AICD wire is visualized.  LEFT VENTRICLE PLAX 2D LVIDd:         4.92 cm LVIDs:         3.29 cm LV PW:         1.39 cm LV IVS:        1.39 cm LVOT diam:     2.00 cm LV SV:         72 LV SV Index:   34 LVOT Area:     3.14 cm  RIGHT VENTRICLE RV S prime:     13.20 cm/s TAPSE (M-mode): 1.6 cm LEFT ATRIUM              Index  RIGHT ATRIUM           Index LA diam:        3.90 cm  1.83 cm/m  RA Area:     20.10 cm LA Vol (A2C):   104.0 ml 48.83 ml/m RA Volume:   52.80 ml  24.79 ml/m LA Vol (A4C):   53.4 ml  25.07 ml/m LA Biplane Vol: 76.3 ml  35.83 ml/m  AORTIC VALVE LVOT Vmax:   147.00 cm/s LVOT Vmean:  99.600 cm/s LVOT VTI:    0.230 m  AORTA Ao Root diam: 3.10 cm MITRAL VALVE MV Area (PHT): 2.34 cm     SHUNTS MV Decel Time: 324 msec     Systemic VTI:  0.23 m MV E velocity: 141.00 cm/s  Systemic Diam: 2.00 cm MV A velocity: 61.60 cm/s MV E/A ratio:  2.29 Lyman Bishop MD Electronically signed by Lyman Bishop MD Signature Date/Time: 07/05/2020/3:45:05 PM    Final    {   Assessment and Plan:   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) - EF this admission 40-45% - increased NSVT burden vs Afib/flutter may be contributing to his reduced EF - not currently on lasix - appears very comfortable on room air - suspect acute CHF exacerbation    NSVT Persistent atrial fibrillation ICD in place - Mg 1.6, replaced per primary team - TSH mildly low - follow in OP setting, but may be contributing to his arrhythmia - he reports being on 200 mg toprol - interrogation with Afib persistently for 2 months - may need to consider DCCV vs ablation with EP - he is anticoagulated with 5 mg eliquis BID - continue uninterrupted - will plan for DCCV on Friday 07/09/20   Hypertension - home regimen losartan-HCTZ - may need lasix instead of HCTZ - continue losartan for reduced EF   Respiratory acidosis - resolved with BiPAP   Acute hypercapnic respiratory failure CAP - ABX per primary   CAD Aortic atherosclerosis - three vessel coronary calcification seen on CT chest - given newly recognized reduced EF and DOE, may consider left/right heart cath once stable   AKI - sCr 1.43 (1.03) - repeat BMP tomorrow AM   Bipolar disorder - on depakote   Fomer smoker - recent relapse - no longer smoking     For questions or updates, please contact [facility] Please consult [email or web address] for contact info under    Signed, Ledora Bottcher, PA  07/07/2020 11:12 AM   I have seen and examined the patient along with Ledora Bottcher, PA.  I have reviewed the chart, notes and new data.  I agree with their note.  Key new complaints: feels much better in less than 24h of therapy. He has not missed any Eliquis doses in last month. Key examination changes: rhythm is irregular due to very frequent PVCs, clear lungs, no murmurs, no edema, no JVD Key new findings / data: ICD interrogation shows onset of persistent AFib since early July, gradual decrease in thoracic impedance in last 4 weeks, sudden decline in activity in last  several days.. No recent VT. 98.2% V paced.  PLAN: Presentation is consistent with gradual decompensation of chronic HF due to persistent (and otherwise asymptomatic) atrial fibrillation. Doubt pneumonia (no pleurisy, cough, fever or chills, quasinormal WBC on admission, now increasing due to steroids). He appears to be much better compensated, but will benefit from return to NSR. We will schedule for DC cardioversion (definitely a slot available by Friday,  but possibly as early as tomorrow). This procedure has been fully reviewed with the patient and written informed consent has been obtained.   Sanda Klein, MD, [place] [phone] 07/07/2020, 1:59 PM

## 2020-07-07 NOTE — TOC Initial Note (Addendum)
Transition of Care [facility]) - Initial/Assessment Note    Patient Details  Name: Mike Gray MRN: [id number] Date of Birth: June 28, 1941  Transition of Care [facility]) CM/SW Contact:    Leeroy Cha, RN Phone Number: 07/07/2020, 9:24 AM  Clinical Narrative:                 79 y.o.malewith medical history significant forbipolar disorder, hypertrophic cardiomyopathy, history of nonsustained VT and complete heart block with ICD, atrial fibrillation on Eliquis, chronic systolic heart failure, COPD and history of GI bleed presented with altered mental status and increased fatigue on 07/04/2020.  On presentation, patient was somnolent and required BiPAP due to ABG showing PCO2 of 72 and pH of 7.3. CT chest showed diffuse mild airway thickening and scattered secretion with opacity in the right lower lobe that could be an early infectious inflammatory process. There is also mild bilateral perinephric stranding that is nonspecific.  He was started on intravenous antibiotics and steroids. plan is for snf placement process started on [id number] at 0925am. Patient has bed offers will notifiy md. md has spoken with patient who states that he has help at home and is not interested in going to rehab. Expected Discharge Plan: Skilled Nursing Facility Barriers to Discharge: Continued Medical Work up   Patient Goals and CMS Choice        Expected Discharge Plan and Services Expected Discharge Plan: [place]   Discharge Planning Services: CM Consult Post Acute Care Choice: [place] Living arrangements for the past 2 months: Single Family Home                                      Prior Living Arrangements/Services Living arrangements for the past 2 months: Single Family Home Lives with:: Relatives Patient language and need for interpreter reviewed:: Yes Do you feel safe going back to the place where you live?: Yes      Need for Family Participation in  Patient Care: Yes (Comment) Care giver support system in place?: Yes (comment)   Criminal Activity/Legal Involvement Pertinent to Current Situation/Hospitalization: No - Comment as needed  Activities of Daily Living Home Assistive Devices/Equipment: Dentures (specify type), Eyeglasses (upper/lower dentures) ADL Screening (condition at time of admission) Patient's cognitive ability adequate to safely complete daily activities?: No Is the patient deaf or have difficulty hearing?: Yes (slight hoh) Does the patient have difficulty seeing, even when wearing glasses/contacts?: No Does the patient have difficulty concentrating, remembering, or making decisions?: Yes Patient able to express need for assistance with ADLs?: Yes Does the patient have difficulty dressing or bathing?: Yes Independently performs ADLs?: No Communication: Independent Dressing (OT): Needs assistance Is this a change from baseline?: Change from baseline, expected to last >3 days Grooming: Needs assistance Is this a change from baseline?: Change from baseline, expected to last >3 days Feeding: Needs assistance Is this a change from baseline?: Change from baseline, expected to last >3 days Bathing: Needs assistance Is this a change from baseline?: Change from baseline, expected to last >3 days Toileting: Needs assistance Is this a change from baseline?: Change from baseline, expected to last >3days In/Out Bed: Needs assistance Is this a change from baseline?: Change from baseline, expected to last >3 days Walks in Home: Needs assistance Is this a change from baseline?: Change from baseline, expected to last >3 days Does the patient have difficulty walking or climbing stairs?: Yes (  secondary to weakness) Weakness of Legs: Both Weakness of Arms/Hands: None  Permission Sought/Granted                  Emotional Assessment Appearance:: Appears stated age     Orientation: : Oriented to Self, Oriented to Place,  Oriented to  Time, Oriented to Situation Alcohol / Substance Use: Not Applicable Psych Involvement: No (comment)  Admission diagnosis:  Delirium [R41.0] COPD exacerbation (HCC) [D22.0] Acute metabolic encephalopathy [U54.27] Community acquired pneumonia of right lower lobe of lung [J18.9] Patient Active Problem List   Diagnosis Date Noted  . 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 COPD with acute exacerbation ([place]) 07/05/2020  . Bipolar disorder ([place]) 07/05/2020  . Unspecified atrial fibrillation ([place]) 07/05/2020  . Chronic systolic CHF (congestive heart failure) ([place]) 07/05/2020  . Hypertrophic cardiomyopathy ([place]) 07/05/2020  . History of complete heart block 07/05/2020  . ICD (implantable cardioverter-defibrillator) in place 07/05/2020  . Acute metabolic encephalopathy 04/15/7627   PCP:  Constance Haw, MD Pharmacy:   [facility], [place] - [address] [place] [address] [place] [address] Phone: [phone] Fax: [phone]     Social Determinants of Health (SDOH) Interventions    Readmission Risk Interventions No flowsheet data found.

## 2020-07-07 NOTE — Progress Notes (Signed)
PROGRESS NOTE  Mike Gray  QJJ:[id number] DOB: 01-19-1941 DOA: 07/04/2020 PCP: Constance Haw, MD  Brief Narrative: Mike Gray is a 79 y.o.malewith medical history significant forbipolar disorder, hypertrophic cardiomyopathy, NSVT, CHB, persistent AFib s/p PPM/ICD, COPD and GI bleeding who presented with altered mental status and increased fatigue on 07/04/2020.  On presentation, patient was somnolent and required BiPAP due to ABG showing PCO2 of 72 and pH of 7.3. CT chest showed diffuse mild airway thickening and scattered secretion with opacity in the right lower lobe that could be an early infectious inflammatory process. There is also mild bilateral perinephric stranding that is nonspecific.  He was started on intravenous antibiotics and steroids. IV lasix was also added and echocardiogram demonstrated newly depressed LVEF to 40-45%. With these treatments, respiratory status has improved. Cardiology is consulted.  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 Likely related to COPD with exacerbation and acute CHF.  - Weaned from oxygen, no longer hypercarbic.  - Continue treating conditions below.  - Patient though to possibly have CAP earlier in admission, though has been afebrile with negative procalcitonin and alternative explanation for symptoms. Will stop antibiotics as pneumonia is not suspected.   Acute HFrEF: LVEF down to 40-45% here, previously normal, with global hypokinesis.  - Hold IV lasix today with bump in creatinine and  improvement in volume status.  - Will need to initiate guideline medical therapy. With hx CAD and significant arrhythmias, will consult cardiology.  - I/O, daily weight, follow up BMP in AM.   Persistent AFib, history of NSVT, CHB, HTN:  - PPM to be interrogated by cardiology.  - Metoprolol has been held, defer restart/dosage to cardiology - Continue eliquis - Supplement hypomagnesemia and monitor.  - TSH mildly suppressed, nonspecific. Recommend recheck after resolution of acute illness.   COPD exacerbation:  - Taper steroids to 40mg  IV daily for now - Continue BDs  AKI: Due to diuresis.  - Monitor in AM, avoid nephrotoxins.  Urinary retention: Requiring I/O cath. Has been evaluated by urologist in the past reporting prostate issues.  - Encourage urology follow up - Will start empiric tamsulosin.   Acute metabolic encephalopathy: Resolved. CT head without acute abnormality.   Bipolar disorder: Quiescent.  - Continue depakote  Generalized deconditioning:  - Continue OOB. Rehabilitation at home is pt's plan as he has his brother for assistance at discharge.   Tobacco use:  - Cessation counseling  DVT prophylaxis: Eliquis Code Status: DNR Family Communication: None at bedside Disposition Plan:  Status is: Inpatient  Remains inpatient appropriate because:Ongoing diagnostic testing needed not appropriate for outpatient work up  Dispo: The patient is from: Home              Anticipated d/c is to: Home              Anticipated d/c date is: 2 days              Patient currently is not medically stable to d/c.  Consultants:   Cardiology  Procedures:   Echocardiogram  Antimicrobials:  Ceftriaxone, azithromycin 9/13-9/14.   Subjective: Feels much  better. Shortness of breath is gone at rest and still moderate with exertion, weaker than his baseline when ambulating but eager to get up. No chest pain now or recently, no palpitations, leg swelling, orthopnea.    Objective: Vitals:   07/07/20 0821 07/07/20 0900 07/07/20 1000 07/07/20 1226  BP:      Pulse:  66 60   Resp:      Temp: 97.9 F (36.6 C)   97.6 F (36.4 C)  TempSrc: Oral   Oral  SpO2:  94% 94%   Weight:        Intake/Output Summary (Last 24 hours) at 07/07/2020 1358 Last data filed at 07/07/2020 1000 Gross per 24 hour  Intake 91.81 ml  Output 1050 ml  Net -958.19 ml   Filed Weights   07/07/20 0500  Weight: 77.6 kg    Gen: 79 y.o. male in no distress  Pulm: Non-labored breathing. Clear to auscultation bilaterally.  CV: Irreg. No murmur, rub, or gallop. No JVD, no pedal edema. GI: Abdomen soft, non-tender, non-distended, with normoactive bowel sounds. No organomegaly or masses felt. Ext: Warm, no deformities Skin: No rashes, lesions or ulcers Neuro: Alert and oriented. No focal neurological deficits. Psych: Judgement and insight appear normal. Mood & affect appropriate.   Data Reviewed: I have personally reviewed following labs and imaging studies  CBC: Recent Labs  Lab 07/04/20 1948 07/05/20 0422 07/06/20 0252 07/07/20 0258  WBC 10.9* 8.6 13.1* 18.4*  NEUTROABS 8.9*  --  12.1* 17.1*  HGB 16.6 15.8 15.8 15.8  HCT 51.2 49.8 49.4 46.7  MCV 95.9 97.3 95.9 92.1  PLT 208 208 232 626   Basic Metabolic Panel: Recent Labs  Lab 07/04/20 1948 07/05/20 0422 07/06/20 0252 07/07/20 0258  NA 140 139 135 138  K 4.1 4.5 4.1 3.9  CL 97* 94* 89* 90*  CO2 31 34* 32 28  GLUCOSE 127* 116* 146* 133*  BUN 32* 32* 40* 60*  CREATININE 1.03 1.04 1.19 1.43*  CALCIUM 9.9 9.4 9.4 9.5  MG  --   --  1.7 1.6*   GFR: CrCl cannot be calculated (Unknown ideal weight.). Liver Function Tests: Recent Labs  Lab 07/04/20 1948 07/06/20 0252  AST 30 29  ALT 30 35  ALKPHOS 48 41  BILITOT 0.9 0.6  PROT 7.2 6.4*  ALBUMIN 4.1 3.5   No results for input(s): LIPASE, AMYLASE in the last 168 hours. Recent Labs  Lab 07/06/20 0252  AMMONIA 19   Coagulation Profile: No results  for input(s): INR, PROTIME in the last 168 hours. Cardiac Enzymes: No results for input(s): CKTOTAL, CKMB, CKMBINDEX, TROPONINI in the last 168 hours. BNP (last 3 results) No results for input(s): PROBNP in the last 8760 hours. HbA1C: No results for input(s): HGBA1C in the last 72 hours. CBG: No results for input(s): GLUCAP in the last 168 hours. Lipid Profile: No results for input(s): CHOL, HDL, LDLCALC, TRIG, CHOLHDL, LDLDIRECT in the last 72 hours. Thyroid Function Tests: Recent Labs    07/06/20 0252  TSH 0.315*   Anemia Panel: Recent Labs    07/06/20 0252  VITAMINB12 502  FOLATE 7.0   Urine analysis:    Component Value Date/Time   COLORURINE YELLOW 07/05/2020 0011   APPEARANCEUR CLEAR 07/05/2020 0011   LABSPEC 1.024 07/05/2020 0011   PHURINE 6.0 07/05/2020 0011   GLUCOSEU NEGATIVE 07/05/2020 0011   HGBUR NEGATIVE 07/05/2020 0011   BILIRUBINUR NEGATIVE 07/05/2020 0011   KETONESUR NEGATIVE 07/05/2020 0011   PROTEINUR 100 (A)  07/05/2020 0011   NITRITE NEGATIVE 07/05/2020 0011   LEUKOCYTESUR NEGATIVE 07/05/2020 0011   Recent Results (from the past 240 hour(s))  SARS Coronavirus 2 by RT PCR (hospital order, performed in [facility] hospital lab) Nasopharyngeal Nasopharyngeal Swab     Status: None   Collection Time: 07/04/20  8:19 PM   Specimen: Nasopharyngeal Swab  Result Value Ref Range Status   SARS Coronavirus 2 NEGATIVE NEGATIVE Final    Comment: (NOTE) SARS-CoV-2 target nucleic acids are NOT DETECTED.  The SARS-CoV-2 RNA is generally detectable in upper and lower respiratory specimens during the acute phase of infection. The lowest concentration of SARS-CoV-2 viral copies this assay can detect is 250 copies / mL. A negative result does not preclude SARS-CoV-2 infection and should not be used as the sole basis for treatment or other patient management decisions.  A negative result may occur with improper specimen collection / handling, submission of specimen  other than nasopharyngeal swab, presence of viral mutation(s) within the areas targeted by this assay, and inadequate number of viral copies (<250 copies / mL). A negative result must be combined with clinical observations, patient history, and epidemiological information.  Fact Sheet for Patients:   StrictlyIdeas.no  Fact Sheet for Healthcare Providers: BankingDealers.co.za  This test is not yet approved or  cleared by the [place] FDA and has been authorized for detection and/or diagnosis of SARS-CoV-2 by FDA under an Emergency Use Authorization (EUA).  This EUA will remain in effect (meaning this test can be used) for the duration of the COVID-19 declaration under Section 564(b)(1) of the Act, 21 U.S.C. section 360bbb-3(b)(1), unless the authorization is terminated or revoked sooner.  Performed at [facility], [place] [address], [place], [place] [address]   MRSA PCR Screening     Status: None   Collection Time: 07/06/20 12:15 AM   Specimen: Nasal Mucosa; Nasopharyngeal  Result Value Ref Range Status   MRSA by PCR NEGATIVE NEGATIVE Final    Comment:        The GeneXpert MRSA Assay (FDA approved for NASAL specimens only), is one component of a comprehensive MRSA colonization surveillance program. It is not intended to diagnose MRSA infection nor to guide or monitor treatment for MRSA infections. Performed at [facility], [place] [address] [place], [place] [address]       Radiology Studies: ECHOCARDIOGRAM COMPLETE  Result Date: 07/05/2020    ECHOCARDIOGRAM REPORT   Patient Name:   JASEAN AMBROSIA Date of Exam: 07/05/2020 Medical Rec #:  [id number]     Height:       72.0 in Accession #:    [id number]    Weight:       199.8 lb Date of Birth:  1941/08/07    BSA:          2.130 m Patient Age:    10 years      BP:           160/87 mmHg Patient Gender: M             HR:           53 bpm. Exam  Location:  Inpatient Procedure: 2D Echo Indications:    Dyspnea R06.00  History:        Patient has prior history of Echocardiogram examinations, most                 recent 08/11/2011. CHF and Hypertrophic Cardiomyopathy,  Pacemaker, COPD, Arrythmias:Atrial Fibrillation; Risk                 Factors:Hypertension.  Sonographer:    Mikki Santee RDCS (AE) Referring Phys: [id number] [place]  1. Left ventricular ejection fraction, by estimation, is 40 to 45%. The left ventricle has mildly decreased function. The left ventricle demonstrates global hypokinesis. There is moderate asymmetric left ventricular hypertrophy of the septal segment. Left ventricular diastolic function could not be evaluated.  2. Right ventricular systolic function is normal. The right ventricular size is normal. A n AICD wire is visualized.  3. The mitral valve is abnormal. Trivial mitral valve regurgitation.  4. The aortic valve is tricuspid. Aortic valve regurgitation is not visualized. Mild to moderate aortic valve sclerosis/calcification is present, without any evidence of aortic stenosis.  5. Aortic dilatation noted. There is mild dilatation of the ascending aorta, measuring 38 mm.  6. The inferior vena cava is dilated in size with >50% respiratory variability, suggesting right atrial pressure of 8 mmHg. Comparison(s): 08/11/2011: Report reviewed, LVEF reportedly normal at 65%. FINDINGS  Left Ventricle: Left ventricular ejection fraction, by estimation, is 40 to 45%. The left ventricle has mildly decreased function. The left ventricle demonstrates global hypokinesis. The left ventricular internal cavity size was normal in size. There is  moderate asymmetric left ventricular hypertrophy of the septal segment. Abnormal (paradoxical) septal motion, consistent with RV pacemaker. Left ventricular diastolic function could not be evaluated due to atrial fibrillation. Left ventricular diastolic  function could not  be evaluated. Right Ventricle: The right ventricular size is normal. No increase in right ventricular wall thickness. Right ventricular systolic function is normal. Left Atrium: Left atrial size was normal in size. Right Atrium: Right atrial size was normal in size. Pericardium: There is no evidence of pericardial effusion. Mitral Valve: The mitral valve is abnormal. Mild to moderate mitral annular calcification. Trivial mitral valve regurgitation. Tricuspid Valve: The tricuspid valve is grossly normal. Tricuspid valve regurgitation is trivial. Aortic Valve: The aortic valve is tricuspid. Aortic valve regurgitation is not visualized. Mild to moderate aortic valve sclerosis/calcification is present, without any evidence of aortic stenosis. Pulmonic Valve: The pulmonic valve was normal in structure. Pulmonic valve regurgitation is not visualized. Aorta: Aortic dilatation noted. There is mild dilatation of the ascending aorta, measuring 38 mm. Venous: The inferior vena cava is dilated in size with greater than 50% respiratory variability, suggesting right atrial pressure of 8 mmHg. IAS/Shunts: No atrial level shunt detected by color flow Doppler. Additional Comments: A n AICD wire is visualized.  LEFT VENTRICLE PLAX 2D LVIDd:         4.92 cm LVIDs:         3.29 cm LV PW:         1.39 cm LV IVS:        1.39 cm LVOT diam:     2.00 cm LV SV:         72 LV SV Index:   34 LVOT Area:     3.14 cm  RIGHT VENTRICLE RV S prime:     13.20 cm/s TAPSE (M-mode): 1.6 cm LEFT ATRIUM              Index       RIGHT ATRIUM           Index LA diam:        3.90 cm  1.83 cm/m  RA Area:     20.10 cm LA Vol (A2C):  104.0 ml 48.83 ml/m RA Volume:   52.80 ml  24.79 ml/m LA Vol (A4C):   53.4 ml  25.07 ml/m LA Biplane Vol: 76.3 ml  35.83 ml/m  AORTIC VALVE LVOT Vmax:   147.00 cm/s LVOT Vmean:  99.600 cm/s LVOT VTI:    0.230 m  AORTA Ao Root diam: 3.10 cm MITRAL VALVE MV Area (PHT): 2.34 cm     SHUNTS MV Decel Time: 324 msec     Systemic  VTI:  0.23 m MV E velocity: 141.00 cm/s  Systemic Diam: 2.00 cm MV A velocity: 61.60 cm/s MV E/A ratio:  2.29 Lyman Bishop MD Electronically signed by Lyman Bishop MD Signature Date/Time: 07/05/2020/3:45:05 PM    Final     Scheduled Meds: . apixaban  5 mg Oral BID  . chlorhexidine  15 mL Mouth Rinse BID  . Chlorhexidine Gluconate Cloth  6 each Topical Daily  . divalproex  1,000 mg Oral Daily  . ipratropium-albuterol  3 mL Nebulization TID  . mouth rinse  15 mL Mouth Rinse q12n4p  . methylPREDNISolone (SOLU-MEDROL) injection  40 mg Intravenous Q12H  . mometasone-formoterol  2 puff Inhalation BID  . tamsulosin  0.4 mg Oral QPC supper   Continuous Infusions: . azithromycin Stopped (07/06/20 2315)  . cefTRIAXone (ROCEPHIN)  IV Stopped (07/06/20 2250)     LOS: 3 days   Time spent: 25 minutes.  Patrecia Pour, MD [facility] [email or web address] 07/07/2020, 1:58 PM

## 2020-07-07 NOTE — NC FL2 (Signed)
[place] LEVEL OF CARE SCREENING TOOL     IDENTIFICATION  Patient Name: Mike Gray Birthdate: 1941/03/12 Sex: male Admission Date (Current Location): 07/04/2020  [facility] [place] and [place] Number:  Herbalist and Address:  [facility],  [place], [place]      Provider Number: [id number]  Attending Physician Name and Address:  Patrecia Pour, MD  Relative Name and Phone Number:       Current Level of Care: Hospital Recommended Level of Care: [place] Prior Approval Number:    Date Approved/Denied:   PASRR Number: [id number]  Discharge Plan: SNF    Current Diagnoses: Patient Active Problem List   Diagnosis Date Noted   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 ([place]) 07/05/2020   COPD with acute exacerbation ([place]) 07/05/2020   Bipolar disorder ([place]) 07/05/2020   Unspecified atrial fibrillation ([place]) 24/26/8341   Chronic systolic CHF (congestive heart failure) ([place]) 07/05/2020   Hypertrophic cardiomyopathy ([place]) 07/05/2020   History of complete heart block 07/05/2020   ICD (implantable cardioverter-defibrillator) in place 96/22/2979   Acute metabolic encephalopathy 89/21/1941    Orientation RESPIRATION BLADDER Height & Weight     Self, Time, Situation, Place  Normal Continent Weight: 77.6 kg Height:     BEHAVIORAL SYMPTOMS/MOOD NEUROLOGICAL BOWEL NUTRITION STATUS      Continent Diet (regular)  AMBULATORY STATUS COMMUNICATION OF NEEDS Skin   Extensive Assist Verbally Normal                       Personal Care Assistance Level of Assistance  Bathing, Feeding, Dressing Bathing Assistance: Limited assistance Feeding assistance: Limited assistance Dressing Assistance: Limited assistance     Functional Limitations Info  Sight, Hearing, Speech Sight Info: Adequate Hearing Info: Adequate Speech Info: Adequate    SPECIAL CARE FACTORS FREQUENCY  PT (By licensed PT), OT  (By licensed OT)     PT Frequency: 5 x weekly OT Frequency: 5 x weekly            Contractures Contractures Info: Not present    Additional Factors Info  Code Status Code Status Info: DNR             Current Medications (07/07/2020):  This is the current hospital active medication list Current Facility-Administered Medications  Medication Dose Route Frequency Provider Last Rate Last Admin   albuterol (PROVENTIL) (2.5 MG/3ML) 0.083% nebulizer solution 2.5 mg  2.5 mg Nebulization Q2H PRN Aline August, MD       apixaban (ELIQUIS) tablet 5 mg  5 mg Oral BID Starla Link, Kshitiz, MD   5 mg at 07/07/20 0901   azithromycin (ZITHROMAX) 500 mg in sodium chloride 0.9 % 250 mL IVPB  500 mg Intravenous Q24H Tu, Ching T, DO   Stopped at 07/06/20 2315   cefTRIAXone (ROCEPHIN) 1 g in sodium chloride 0.9 % 100 mL IVPB  1 g Intravenous Q24H Tu, Ching T, DO   Stopped at 07/06/20 2250   chlorhexidine (PERIDEX) 0.12 % solution 15 mL  15 mL Mouth Rinse BID Starla Link, Kshitiz, MD   15 mL at 07/07/20 0911   Chlorhexidine Gluconate Cloth 2 % PADS 6 each  6 each Topical Daily Aline August, MD   6 each at 07/07/20 0911   divalproex (DEPAKOTE ER) 24 hr tablet 1,000 mg  1,000 mg Oral Daily Starla Link, Kshitiz, MD   1,000 mg at 07/07/20 0901   ipratropium-albuterol (DUONEB) 0.5-2.5 (3) MG/3ML nebulizer solution 3  mL  3 mL Nebulization TID Aline August, MD   3 mL at 07/07/20 0859   magnesium sulfate IVPB 2 g 50 mL  2 g Intravenous Once Patrecia Pour, MD   Stopped at 07/07/20 1015   MEDLINE mouth rinse  15 mL Mouth Rinse q12n4p Alekh, Kshitiz, MD   15 mL at 07/06/20 1210   methylPREDNISolone sodium succinate (SOLU-MEDROL) 40 mg/mL injection 40 mg  40 mg Intravenous Q12H Patrecia Pour, MD       mometasone-formoterol (DULERA) 100-5 MCG/ACT inhaler 2 puff  2 puff Inhalation BID Aline August, MD   2 puff at 07/07/20 0900     Discharge Medications: Please see discharge summary for a list of discharge  medications.  Relevant Imaging Results:  Relevant Lab Results:   Additional Information EZM:[id number]  Leeroy Cha, RN

## 2020-07-07 NOTE — Progress Notes (Signed)
I have scheduled this patient for DCCV at [facility] on Friday, 07/09/20. I have started an amiodarone load via gtt. Will evaluate tomorrow. Dr. Sallyanne Kuster will discuss with PCCM attending the possibility for DCCV in ICU tomorrow instead of transporting to [facility] on Friday. I have made him NPO at [place] for this.    Tami Lin Cathie Bonnell, PA-C 07/07/2020, 2:26 PM [place]

## 2020-07-08 DIAGNOSIS — J9622 Acute and chronic respiratory failure with hypercapnia: Secondary | ICD-10-CM

## 2020-07-08 DIAGNOSIS — Z8679 Personal history of other diseases of the circulatory system: Secondary | ICD-10-CM

## 2020-07-08 DIAGNOSIS — I472 Ventricular tachycardia: Secondary | ICD-10-CM

## 2020-07-08 DIAGNOSIS — G9341 Metabolic encephalopathy: Secondary | ICD-10-CM

## 2020-07-08 DIAGNOSIS — I4819 Other persistent atrial fibrillation: Secondary | ICD-10-CM

## 2020-07-08 LAB — BASIC METABOLIC PANEL
Anion gap: 10 (ref 5–15)
BUN: 55 mg/dL — ABNORMAL HIGH (ref 8–23)
CO2: 31 mmol/L (ref 22–32)
Calcium: 8.8 mg/dL — ABNORMAL LOW (ref 8.9–10.3)
Chloride: 94 mmol/L — ABNORMAL LOW (ref 98–111)
Creatinine, Ser: 1.18 mg/dL (ref 0.61–1.24)
GFR calc Af Amer: >60 mL/min (ref >60)
GFR calc non Af Amer: 59 mL/min — ABNORMAL LOW (ref >60)
Glucose, Bld: 99 mg/dL (ref 70–99)
Potassium: 3.8 mmol/L (ref 3.5–5.1)
Sodium: 135 mmol/L (ref 135–145)

## 2020-07-08 LAB — GLUCOSE, CAPILLARY: Glucose-Capillary: 113 mg/dL — ABNORMAL HIGH (ref 70–99)

## 2020-07-08 LAB — MAGNESIUM: Magnesium: 2.2 mg/dL (ref 1.7–2.4)

## 2020-07-08 MED ORDER — MIDAZOLAM HCL 2 MG/2ML IJ SOLN
INTRAMUSCULAR | Status: AC
  Filled 2020-07-08: qty 2

## 2020-07-08 MED ORDER — MIDAZOLAM HCL 2 MG/2ML IJ SOLN
2.0000 mg | Freq: Once | INTRAMUSCULAR | Status: AC
  Administered 2020-07-08: 2 mg via INTRAVENOUS
  Filled 2020-07-08: qty 2

## 2020-07-08 MED ORDER — FENTANYL CITRATE (PF) 100 MCG/2ML IJ SOLN
100.0000 ug | Freq: Once | INTRAMUSCULAR | Status: AC
  Administered 2020-07-08: 100 ug via INTRAVENOUS
  Filled 2020-07-08: qty 2

## 2020-07-08 NOTE — Progress Notes (Signed)
RECVD report from previous nurse in this shift- will be taking over care. Pt ambulated to restroom in attempt to void and there was no to little output. Completed a bladder scan, noted previous prostate history and found to have ~700 of urine holding. Reached out to [facility] hospitalist and discussed patients care. With recent attempts to in and out cath patient and the last attempt was less than 24 hours. Order was submitted to place a Foley Cath.   Obtain 64french foley cath. And used the buddy system and sterile technique to place catheter. No pain at this time with patient. Noted signs of relief and output immediately. Pt resting well. Urine is a clear to yellow color.   amidoerone drip is continuous in left AC/FA #20g peripheral IV

## 2020-07-08 NOTE — Progress Notes (Signed)
  Asked to provide conscious sedation for DCCV  79 year old man with hypertrophic cardiomyopathy, EF 45%, CHB status post PPM/ICD Admitted with hypercarbic respiratory failure 9/12 Treated with IV Lasix and antibiotics and steroids, for opacity in right lower lobe  Vitals:   07/08/20 1300 07/08/20 1400  BP: (!) 118/47 (!) 130/96  Pulse: (!) 59 60  Resp: (!) 24 13  Temp:    SpO2: 92% 93%   On exam -alert, interactive, saturation 90% on room air, clear breath sounds bilateral, S1-S2 regular, soft nontender abdomen, no edema.  Impression -persistent atrial fibrillation which is triggering heart failure  Recommend -assisted with conscious sedation for DCCV procedure. Given 2 mg Versed and 100 mcg of fentanyl in divided doses over 5 minutes. Cardioverted with 120 J biphasic synchronized shock. He tolerated procedure well with transient desaturation and is now saturating 96% on 2 L nasal cannula  Defer further care to hospitalist service  Kara Mead MD. [facility] See Amion for pager  If no response to pager , please call [phone]  After 7:00 pm call Elink  [phone]   07/08/2020

## 2020-07-08 NOTE — Procedures (Signed)
Procedure: Electrical Cardioversion Indications:  Atrial Fibrillation  Procedure Details:  Consent: Risks of procedure as well as the alternatives and risks of each were explained to the (patient/caregiver).  Consent for procedure obtained.  Time Out: Verified patient identification, verified procedure, site/side was marked, verified correct patient position, special equipment/implants available, medications/allergies/relevent history reviewed, required imaging and test results available.  Performed  Patient placed on cardiac monitor, pulse oximetry, supplemental oxygen as necessary.  Sedation given: Versed 2 mg + Fentanul 100 mcg IV, Dr. Elsworth Soho Pacer pads placed anterior and posterior chest.  Cardioverted 1 time(s).  Cardioversion with synchronized biphasic 120J shock.  Evaluation: Findings: Post procedure EKG shows: NSR Complications: None Patient did tolerate procedure well. Normal ICD function post shock. Atrial preference pacing programmed "on". Continue IV amiodarone overnight. DC home on PO amiodarone and follow up w AFib clinic and [name].  Time Spent Directly with the Patient:  30 minutes   Mike Gray 07/08/2020, 2:33 PM

## 2020-07-08 NOTE — Progress Notes (Signed)
PT Cancellation Note  Patient Details Name: Quentyn Kolbeck MRN: [id number] DOB: [birth year]   Cancelled Treatment:    Reason Eval/Treat Not Completed: Medical/ cardiac  issues which prohibited therapy. Will follow for medical readiness to continue PT.     Claretha Cooper 07/08/2020, [place] Pager [phone] Office [phone]

## 2020-07-08 NOTE — Progress Notes (Signed)
PROGRESS NOTE  Dierre Crevier  CZY:[id number] DOB: [birth year] DOA: 07/04/2020 PCP: Constance Haw, MD  Brief Narrative: Mekhai Venuto is a 79 y.o.malewith medical history significant forbipolar disorder, hypertrophic cardiomyopathy, NSVT, CHB, persistent AFib s/p PPM/ICD, COPD and GI bleeding who presented with altered mental status and increased fatigue on 07/04/2020.  On presentation, patient was somnolent and required BiPAP due to ABG showing PCO2 of 72 and pH of 7.3. CT chest showed diffuse mild airway thickening and scattered secretion with opacity in the right lower lobe that could be an early infectious inflammatory process. There is also mild bilateral perinephric stranding that is nonspecific.  He was started on intravenous antibiotics and steroids. IV lasix was also added and echocardiogram demonstrated newly depressed LVEF to 40-45%. With these treatments, respiratory status has improved. Cardiology is consulted.  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 Likely related to COPD with exacerbation and acute CHF.  - Weaned from oxygen, no longer hypercarbic.  - Continue treating conditions below.  - Patient though to possibly have CAP earlier in admission, though has been afebrile with negative procalcitonin and alternative explanation for symptoms. Antibiotics stopped as pneumonia is not suspected.   Acute HFrEF: LVEF down to 40-45% here, previously normal, with global hypokinesis.  - Diuretic per cardiologymprovement in volume status.  -  Will need to initiate guideline medical therapy. Cardiology consulted, suspect arrhythmia-induced CHF, planning DCCV. - Will plan ischemic evaluation 30 days after cardioversion to avoid interruption in anticoagulation. - I/O, daily weight. Cr improved.   Persistent AFib, history of NSVT, CHB, HTN:  - DCCV planned today vs. tmrw.   - Metoprolol has been held, defer restart/dosage to cardiology - Continue eliquis - Amiodarone gtt started per cardiology.  - TSH mildly suppressed, nonspecific. Recommend recheck after resolution of acute illness.   COPD exacerbation:  - s/p 5 days steroids.  - Continue BDs  AKI: Due to diuresis. Resolved on 9/16. - Monitor in AM, avoid nephrotoxins.  Urinary retention: Requiring I/O cath many times, ultimately foley placed overnight into 9/16. Has been evaluated by urologist in the past reporting prostate issues.  - Encourage urology follow up - Continue tamsulosin - Voiding trial prior to DC.  Acute metabolic encephalopathy: Resolved. CT head without acute abnormality.   Bipolar disorder: Quiescent.  - Continue depakote  Generalized deconditioning:  - Continue OOB. Rehabilitation at home is pt's plan as he has his brother for assistance at discharge.   Tobacco use:  - Cessation counseling  DVT prophylaxis: Eliquis Code Status: DNR Family Communication: None at bedside. Cardiology spoke with brother. Disposition Plan:  Status is: Inpatient  Remains inpatient appropriate because:Ongoing diagnostic testing needed not appropriate for outpatient work up  Dispo: The patient is from: Home              Anticipated d/c is to: Home              Anticipated d/c date is: 2 days              Patient currently is not medically stable to d/c.  Consultants:   Cardiology  Procedures:   Echocardiogram  Antimicrobials:  Ceftriaxone, azithromycin 9/13-9/14.   Subjective: Hungry, understands reasoning for procedure. Denies shortness of breath,  fever, wheezing, cough, chest pain. Not very clear, but doesn't think he's had palpitations.   Objective: Vitals:   07/08/20 0900 07/08/20 1000 07/08/20 1100 07/08/20 1200  BP: 128/71 121/70 125/81 126/78  Pulse: 66 60 60 60  Resp: (!) 23 (!) 21 (!) 23 (!) 30  Temp:    97.7 F (36.5 C)  TempSrc:    Oral  SpO2: 92% 93% (!) 89% 92%  Weight:        Intake/Output Summary (Last 24 hours) at 07/08/2020 1321 Last data filed at 07/08/2020 1200 Gross per 24 hour  Intake 360.39 ml  Output 2200 ml  Net -1839.61 ml   Filed Weights   07/07/20 0500  Weight: 77.6 kg   Gen: 79 y.o. male in no distress Pulm: Nonlabored breathing room air, tachypneic without accessory muscle use. Clear. CV: Irreg. No murmur, rub, or gallop. No JVD, no dependent edema. GI: Abdomen soft, non-tender, non-distended, with normoactive bowel sounds.  Ext: Warm, no deformities Skin: No rashes, lesions or ulcers on visualized skin. Neuro: Alert and oriented. No focal neurological deficits. Psych: Judgement and insight appear fair. Mood euthymic & affect congruent. Behavior is appropriate.    Data Reviewed: I have personally reviewed following labs and imaging studies  CBC: Recent Labs  Lab 07/04/20 1948 07/05/20 0422 07/06/20 0252 07/07/20 0258  WBC 10.9* 8.6 13.1* 18.4*  NEUTROABS 8.9*  --  12.1* 17.1*  HGB 16.6 15.8 15.8 15.8  HCT 51.2 49.8 49.4 46.7  MCV 95.9 97.3 95.9 92.1  PLT 208 208 232 338   Basic Metabolic Panel: Recent Labs  Lab 07/04/20 1948 07/05/20 0422 07/06/20 0252 07/07/20 0258 07/08/20 0808  NA 140 139 135 138 135  K 4.1 4.5 4.1 3.9 3.8  CL 97* 94* 89* 90* 94*  CO2 31 34* 32 28 31  GLUCOSE 127* 116* 146* 133* 99  BUN 32* 32* 40* 60* 55*  CREATININE 1.03 1.04 1.19 1.43* 1.18  CALCIUM 9.9 9.4 9.4 9.5 8.8*  MG  --   --  1.7 1.6* 2.2   GFR: CrCl cannot be calculated (Unknown ideal weight.). Liver Function Tests: Recent Labs  Lab 07/04/20 1948 07/06/20 0252  AST 30 29  ALT  30 35  ALKPHOS 48 41  BILITOT 0.9 0.6  PROT 7.2 6.4*  ALBUMIN 4.1 3.5   No results for input(s): LIPASE, AMYLASE in the last 168 hours. Recent Labs  Lab 07/06/20 0252  AMMONIA 19   Coagulation Profile: No results for input(s): INR, PROTIME in the last 168 hours. Cardiac Enzymes: No results for input(s): CKTOTAL, CKMB, CKMBINDEX, TROPONINI in the last 168 hours. BNP (last 3 results) No results for input(s): PROBNP in the last 8760 hours. HbA1C: No results for input(s): HGBA1C in the last 72 hours. CBG: No results for input(s): GLUCAP in the last 168 hours. Lipid Profile: No results for input(s): CHOL, HDL, LDLCALC, TRIG, CHOLHDL, LDLDIRECT in the last 72 hours. Thyroid Function Tests: Recent Labs    07/06/20 0252  TSH 0.315*   Anemia Panel: Recent Labs    07/06/20 0252  VITAMINB12 502  FOLATE 7.0   Urine analysis:    Component Value Date/Time   COLORURINE YELLOW 07/05/2020 0011   APPEARANCEUR CLEAR 07/05/2020 0011   LABSPEC 1.024 07/05/2020 0011   PHURINE 6.0 07/05/2020 0011   GLUCOSEU NEGATIVE 07/05/2020 0011   HGBUR NEGATIVE  07/05/2020 0011   BILIRUBINUR NEGATIVE 07/05/2020 0011   KETONESUR NEGATIVE 07/05/2020 0011   PROTEINUR 100 (A) 07/05/2020 0011   NITRITE NEGATIVE 07/05/2020 0011   LEUKOCYTESUR NEGATIVE 07/05/2020 0011   Recent Results (from the past 240 hour(s))  SARS Coronavirus 2 by RT PCR (hospital order, performed in [facility] hospital lab) Nasopharyngeal Nasopharyngeal Swab     Status: None   Collection Time: 07/04/20  8:19 PM   Specimen: Nasopharyngeal Swab  Result Value Ref Range Status   SARS Coronavirus 2 NEGATIVE NEGATIVE Final    Comment: (NOTE) SARS-CoV-2 target nucleic acids are NOT DETECTED.  The SARS-CoV-2 RNA is generally detectable in upper and lower respiratory specimens during the acute phase of infection. The lowest concentration of SARS-CoV-2 viral copies this assay can detect is 250 copies / mL. A negative result does not  preclude SARS-CoV-2 infection and should not be used as the sole basis for treatment or other patient management decisions.  A negative result may occur with improper specimen collection / handling, submission of specimen other than nasopharyngeal swab, presence of viral mutation(s) within the areas targeted by this assay, and inadequate number of viral copies (<250 copies / mL). A negative result must be combined with clinical observations, patient history, and epidemiological information.  Fact Sheet for Patients:   StrictlyIdeas.no  Fact Sheet for Healthcare Providers: BankingDealers.co.za  This test is not yet approved or  cleared by the [place] FDA and has been authorized for detection and/or diagnosis of SARS-CoV-2 by FDA under an Emergency Use Authorization (EUA).  This EUA will remain in effect (meaning this test can be used) for the duration of the COVID-19 declaration under Section 564(b)(1) of the Act, 21 U.S.C. section 360bbb-3(b)(1), unless the authorization is terminated or revoked sooner.  Performed at [facility], [place] [address] [place], [place] [address]   MRSA PCR Screening     Status: None   Collection Time: 07/06/20 12:15 AM   Specimen: Nasal Mucosa; Nasopharyngeal  Result Value Ref Range Status   MRSA by PCR NEGATIVE NEGATIVE Final    Comment:        The GeneXpert MRSA Assay (FDA approved for NASAL specimens only), is one component of a comprehensive MRSA colonization surveillance program. It is not intended to diagnose MRSA infection nor to guide or monitor treatment for MRSA infections. Performed at [facility], [place] [address], [place], [place] [address]       Radiology Studies: No results found.  Scheduled Meds: . apixaban  5 mg Oral BID  . chlorhexidine  15 mL Mouth Rinse BID  . Chlorhexidine Gluconate Cloth  6 each Topical Daily  . divalproex   1,000 mg Oral Daily  . ipratropium-albuterol  3 mL Nebulization TID  . mouth rinse  15 mL Mouth Rinse q12n4p  . methylPREDNISolone (SOLU-MEDROL) injection  40 mg Intravenous Daily  . mometasone-formoterol  2 puff Inhalation BID  . polyethylene glycol  17 g Oral Daily  . tamsulosin  0.4 mg Oral QPC supper   Continuous Infusions: . amiodarone Stopped (07/08/20 1136)     LOS: 4 days   Time spent: 25 minutes.  Patrecia Pour, MD [facility] [email or web address] 07/08/2020, 1:21 PM

## 2020-07-08 NOTE — Progress Notes (Signed)
Progress Note  Patient Name: Mike Gray Date of Encounter: 07/08/2020  [facility] Cardiologist: Mike [name], MD   Subjective   Doing well, denies dyspnea off oxygen. Afib, V paced. Problems with urinary retention.  Inpatient Medications    Scheduled Meds: . apixaban  5 mg Oral BID  . chlorhexidine  15 mL Mouth Rinse BID  . Chlorhexidine Gluconate Cloth  6 each Topical Daily  . divalproex  1,000 mg Oral Daily  . ipratropium-albuterol  3 mL Nebulization TID  . mouth rinse  15 mL Mouth Rinse q12n4p  . methylPREDNISolone (SOLU-MEDROL) injection  40 mg Intravenous Daily  . mometasone-formoterol  2 puff Inhalation BID  . polyethylene glycol  17 g Oral Daily  . tamsulosin  0.4 mg Oral QPC supper   Continuous Infusions: . amiodarone 30 mg/hr (07/08/20 0901)   PRN Meds: albuterol, senna-docusate   Vital Signs    Vitals:   07/08/20 0600 07/08/20 0800 07/08/20 0822 07/08/20 0900  BP: (!) 119/48 135/65  128/71  Pulse: (!) 59 65  66  Resp: (!) 22 17  (!) 23  Temp:  97.8 F (36.6 C)    TempSrc:  Oral    SpO2: 93% 95% 93% 92%  Weight:        Intake/Output Summary (Last 24 hours) at 07/08/2020 1212 Last data filed at 07/08/2020 0800 Gross per 24 hour  Intake 301.59 ml  Output 2200 ml  Net -1898.41 ml   Last 3 Weights 07/07/2020 10/13/2019 08/13/2018  Weight (lbs) 171 lb 1.2 oz 199 lb 12.8 oz 192 lb  Weight (kg) 77.6 kg 90.629 kg 87.091 kg      Telemetry    AFib, V paced - Personally Reviewed  ECG    No new tracing - Personally Reviewed  Physical Exam  Sitting in chair GEN: No acute distress.   Neck: No JVD Cardiac: RRR, paradoxically split S2, no murmurs, rubs, or gallops.  Respiratory: Clear to auscultation bilaterally. GI: Soft, nontender, non-distended  MS: No edema; No deformity. Neuro:  Nonfocal  Psych: Normal affect   Labs    High Sensitivity Troponin:  No results for input(s): TROPONINIHS in the last 720 hours.     Chemistry Recent Labs  Lab 07/04/20 1948 07/05/20 0422 07/06/20 0252 07/07/20 0258 07/08/20 0808  NA 140   < > 135 138 135  K 4.1   < > 4.1 3.9 3.8  CL 97*   < > 89* 90* 94*  CO2 31   < > 32 28 31  GLUCOSE 127*   < > 146* 133* 99  BUN 32*   < > 40* 60* 55*  CREATININE 1.03   < > 1.19 1.43* 1.18  CALCIUM 9.9   < > 9.4 9.5 8.8*  PROT 7.2  --  6.4*  --   --   ALBUMIN 4.1  --  3.5  --   --   AST 30  --  29  --   --   ALT 30  --  35  --   --   ALKPHOS 48  --  41  --   --   BILITOT 0.9  --  0.6  --   --   GFRNONAA >60   < > 58* 47* 59*  GFRAA >60   < > >60 54* >60  ANIONGAP 12   < > 14 20* 10   < > = values in this interval not displayed.     Hematology Recent Labs  Lab 07/05/20 0422 07/06/20 0252 07/07/20 0258  WBC 8.6 13.1* 18.4*  RBC 5.12 5.15 5.07  HGB 15.8 15.8 15.8  HCT 49.8 49.4 46.7  MCV 97.3 95.9 92.1  MCH 30.9 30.7 31.2  MCHC 31.7 32.0 33.8  RDW 14.0 13.6 14.1  PLT 208 232 231    BNP Recent Labs  Lab 07/04/20 1948  BNP 794.8*     DDimer No results for input(s): DDIMER in the last 168 hours.   Radiology    No results found.  Cardiac Studies    Echo 07/05/20: 1. Left ventricular ejection fraction, by estimation, is 40 to 45%. The  left ventricle has mildly decreased function. The left ventricle  demonstrates global hypokinesis. There is moderate asymmetric left  ventricular hypertrophy of the septal segment.  Left ventricular diastolic function could not be evaluated.  2. Right ventricular systolic function is normal. The right ventricular  size is normal. A n AICD wire is visualized.  3. The mitral valve is abnormal. Trivial mitral valve regurgitation.  4. The aortic valve is tricuspid. Aortic valve regurgitation is not  visualized. Mild to moderate aortic valve sclerosis/calcification is  present, without any evidence of aortic stenosis.  5. Aortic dilatation noted. There is mild dilatation of the ascending  aorta, measuring 38 mm.   6. The inferior vena cava is dilated in size with >50% respiratory  variability, suggesting right atrial pressure of 8 mmHg.    Patient Profile     79 y.o. male  with a hx of HCM, NSVT, CHB s/p ICD (VT and HCM), PAF on eliquis, HTN, and COPD admitted with respiratory distress, CHF exacerbation, persistent AFib since July.  Assessment & Plan    1,  Ac systolic HF: EF 42-39%. 2. Hypertrophic CMP: nonobstructive, longstanding diastolic HF. 3. Persistent AFib:  Compliant w anticoagulation. Precedes HF exacerbation by at least a month, likely precipitated decompensation. Plan DCCV today. This procedure has been fully reviewed with the patient and written informed consent has been obtained. Also discussed with his brother, Mike Gray. 4. Acute Resp failure hypoxia and hypercapnia:  Resolved; likely mostly or entirely due to CHF. 5. CAD: widespread coronary calcification. No angina. New reduction in LVEF, plan ischemic evaluation. Probably coronary angio is best approach, but delay for 30 days after DCCV to avoid interruption in anticoagulation. 6. NSVT: on high dose beta blocker. Mike be leaving on oral amiodarone.      For questions or updates, please contact [place] Please consult [email or web address] for contact info under        Signed, Sanda Klein, MD  07/08/2020, 12:12 PM

## 2020-07-09 DIAGNOSIS — J441 Chronic obstructive pulmonary disease with (acute) exacerbation: Secondary | ICD-10-CM

## 2020-07-09 DIAGNOSIS — F319 Bipolar disorder, unspecified: Secondary | ICD-10-CM

## 2020-07-09 LAB — BASIC METABOLIC PANEL
Anion gap: 12 (ref 5–15)
BUN: 47 mg/dL — ABNORMAL HIGH (ref 8–23)
CO2: 33 mmol/L — ABNORMAL HIGH (ref 22–32)
Calcium: 9.2 mg/dL (ref 8.9–10.3)
Chloride: 93 mmol/L — ABNORMAL LOW (ref 98–111)
Creatinine, Ser: 1.27 mg/dL — ABNORMAL HIGH (ref 0.61–1.24)
GFR calc Af Amer: >60 mL/min (ref >60)
GFR calc non Af Amer: 54 mL/min — ABNORMAL LOW (ref >60)
Glucose, Bld: 110 mg/dL — ABNORMAL HIGH (ref 70–99)
Potassium: 3.7 mmol/L (ref 3.5–5.1)
Sodium: 138 mmol/L (ref 135–145)

## 2020-07-09 LAB — MAGNESIUM: Magnesium: 2.1 mg/dL (ref 1.7–2.4)

## 2020-07-09 MED ORDER — AMIODARONE HCL 200 MG PO TABS: 200.0000 mg | ORAL_TABLET | Freq: Every day | ORAL | Status: DC

## 2020-07-09 MED ORDER — METOPROLOL SUCCINATE ER 25 MG PO TB24
50.0000 mg | ORAL_TABLET | Freq: Every day | ORAL | Status: DC
  Administered 2020-07-09: 50 mg via ORAL
  Filled 2020-07-09: qty 2

## 2020-07-09 MED ORDER — METOPROLOL SUCCINATE ER 25 MG PO TB24: 12.5000 mg | ORAL_TABLET | Freq: Every day | ORAL | Status: DC

## 2020-07-09 MED ORDER — AMIODARONE HCL 200 MG PO TABS
400.0000 mg | ORAL_TABLET | Freq: Every day | ORAL | Status: DC
  Administered 2020-07-09: 400 mg via ORAL
  Filled 2020-07-09: qty 2

## 2020-07-09 MED ORDER — AMIODARONE HCL IN DEXTROSE 360-4.14 MG/200ML-% IV SOLN
INTRAVENOUS | Status: AC
  Filled 2020-07-09: qty 200

## 2020-07-09 MED ORDER — AMIODARONE HCL 200 MG PO TABS: ORAL_TABLET | ORAL | 0 refills | Status: DC

## 2020-07-09 MED ORDER — TAMSULOSIN HCL 0.4 MG PO CAPS: 0.4000 mg | ORAL_CAPSULE | Freq: Every day | ORAL | 0 refills | Status: AC

## 2020-07-09 NOTE — TOC Progression Note (Signed)
Transition of Care [facility]) - Progression Note    Patient Details  Name: Mike Gray MRN: [id number] Date of Birth: Sep 29, 1941  Transition of Care [facility]) CM/SW Contact  Leeroy Cha, RN Phone Number: 07/09/2020, 11:18 AM  Clinical Narrative:    hhc will be done through [facility] for pt,ot and social workj   Expected Discharge Plan: [place] Barriers to Discharge: Continued Medical Work up  Expected Discharge Plan and Services Expected Discharge Plan: [place]   Discharge Planning Services: CM Consult Post Acute Care Choice: [place] arrangements for the past 2 months: Single Family Home Expected Discharge Date: 07/09/20                         HH Arranged: OT, PT, Social Work [place] Agency: [facility] Date [place]: 07/09/20 Time [place]: 1117 Representative spoke with at [place]: [place] ([place]) Interventions    Readmission Risk Interventions No flowsheet data found.

## 2020-07-09 NOTE — TOC Progression Note (Signed)
Transition of Care [facility]) - Progression Note    Patient Details  Name: Mike Gray MRN: [id number] Date of Birth: Oct 22, 1941  Transition of Care [facility] CM/SW Contact  Leeroy Cha, RN Phone Number: 07/09/2020, 9:11 AM  Clinical Narrative:    DCCV done on [id number] under conscious sedation, Iv amiodarone to be switched to po ac discharge to home.   Expected Discharge Plan: [place] Barriers to Discharge: Continued Medical Work up  Expected Discharge Plan and Services Expected Discharge Plan: [place]   Discharge Planning Services: CM Consult Post Acute Care Choice: [place] arrangements for the past 2 months: Single Family Home Expected Discharge Date: 07/09/20                                     Social Determinants of Health (SDOH) Interventions    Readmission Risk Interventions No flowsheet data found.

## 2020-07-09 NOTE — Progress Notes (Signed)
Pt has been unable to void since catheter removal at 0830. Bladder scan only showed 196 mLs. This RN will continue to monitor for urinary retention.

## 2020-07-09 NOTE — Discharge Summary (Signed)
Physician Discharge Summary  Mike Gray AYT:[id number] DOB: 17-Mar-1941 DOA: 07/04/2020  PCP: Constance Haw, MD  Admit date: 07/04/2020 Discharge date: 07/09/2020  Admitted From: Home Disposition: Home   Recommendations for Outpatient Follow-up:  1. Follow up with cardiology/EP 9/23.  2. Recommend outpatient sleep study, had nocturnal desaturations while admitted. 3. Follow up with urology for urinary retention, history of BPH, started on tamsulosin. 4. Follow up with PCP. Recommend repeat thyroid studies after this acute illness.  Home Health: PT, OT, CSW (SNF was recommended, though pt declined) Equipment/Devices: None Discharge Condition: Stable CODE STATUS: DNR Diet recommendation: Heart healthy  Brief/Interim Summary: Mike Gray is a 79 y.o.malewith medical history significant forbipolar disorder, hypertrophic cardiomyopathy, NSVT, CHB, persistent AFib s/p PPM/ICD, COPD and GI bleeding who presented with altered mental status and increased fatigue on 07/04/2020. On presentation, patient was somnolent and required BiPAP due to ABG showing PCO2 of 72 and pH of 7.3. CT chest showed diffuse mild airway thickening and scattered secretion with opacity in the right lower lobe that could be an early infectious inflammatory process. There is also mild bilateral perinephric stranding that is nonspecific.He was started on intravenous antibioticsand steroids. IV lasix was also added and echocardiogram demonstrated newly depressed LVEF to 40-45%. With these treatments, respiratory status has improved, hypoxemia has resolved. Cardiology was consulted, felt heart failure was precipitated by arrhythmia over previous 2 months, started amiodarone load, and performed cardioversion 9/16. The patient is stable for discharge on 9/17 with new medications as recommended below and cardiology follow up secured prior to discharge. Please see below for full details.  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 Likely related to COPD with exacerbation and acute CHF.  - Weaned from oxygen, no longer hypercarbic.  - Continue treating conditions below.  - Patient though to possibly have CAP earlier in admission, though has been afebrile with negative procalcitonin and alternative explanation for symptoms. Antibiotics stopped as pneumonia is not suspected.  - Strongly encourage sleep study as outpatient.  Acute HFrEF: LVEF down to 40-45% here, previously normal, with global hypokinesis. Thought to have been precipitated by arrhythmia.   - Appears euvolemic at discharge, will restart home medications. With restoration of rhythm, anticipate no further lasix necessary.  - Will plan ischemic evaluation no sooner than 30 days after cardioversion to avoid interruption in anticoagulation.  Persistent AFib, history of NSVT, CHB, HTN: s/p DCCV 9/16.  - Continue eliquis, metoprolol - Amiodarone gtt started per cardiology > DC on amiodarone load per cardiology.  - TSH mildly suppressed, nonspecific. Recommend recheck after resolution of acute illness.   Urinary retention: Requiring I/O cath many times, ultimately foley placed overnight into 9/16. Has been evaluated by urologist in the past reporting prostate issues.  - Voiding trial prior to DC > May discharge patient with foley in place pending urology follow up. Encourage urology follow up regardless. Rx tamsulosin at discharge.  COPD exacerbation:  - s/p 5 days steroids. No wheezing.  - Continue  BDs  AKI: Due to diuresis. Resolved on 9/16.  Acute metabolic encephalopathy: Resolved. CT head without acute abnormality.  Bipolar disorder: Quiescent.  - Continue depakote  Generalized deconditioning:  - Continue OOB. SNF was recommended, though declined. Rehabilitation at home is pt's plan as he has his brother for assistance at discharge.   Tobacco use:  - Cessation counseling  Discharge Instructions Discharge Instructions    Diet - low sodium heart healthy   Complete by: As directed    Discharge instructions   Complete by: As directed    Medication Recommendations:   - restart losartan HCTZ 100-12.5 - continue metoprolol, apixaban and atorvastatin in previous doses - amiodarone 400 mg daily for 2 weeks, then 200 mg daily - tamsulosin daily to help with bladder emptying. Please call your urologist for follow up. Other recommendations:  - Monitor daily weights and call for weight gain >3 lb/24h or 5 lb/week. - Follow up as an outpatient:  AFib clinic or [name] in 2 weeks.   Increase activity slowly   Complete by: As directed      Allergies as of 07/09/2020   No Known Allergies     Medication List    TAKE these medications   amiodarone 200 MG tablet Commonly known as: PACERONE Take 2 tablets (400 mg total) by mouth daily for 14 days, THEN 1 tablet (200 mg total) daily for 14 days. and continue 200mg  daily. Start taking on: July 10, 2020   apixaban 5 MG Tabs tablet Commonly known as: Eliquis Take 1 tablet (5 mg total) by mouth 2 (two) times daily.   atorvastatin 20 MG tablet Commonly known as: LIPITOR Take 20 mg by mouth daily.   divalproex 500 MG 24 hr tablet Commonly known as: DEPAKOTE ER Take 1,000 mg by mouth daily.   losartan-hydrochlorothiazide 100-12.5 MG tablet Commonly known as: HYZAAR Take 1 tablet by mouth daily.   metoprolol 200 MG 24 hr tablet Commonly known as: TOPROL-XL Take 1 tablet (200 mg total) by mouth daily. Take  with or immediately following a meal.   tamsulosin 0.4 MG Caps capsule Commonly known as: FLOMAX Take 1 capsule (0.4 mg total) by mouth daily after supper.       Follow-up Information    Baldwin Jamaica, PA-C Follow up on 07/15/2020.   Specialty: Cardiology Why: at [facility] information: [place] [address] [phone]              No Known Allergies  Consultations:  Cardiology  PCCM  Procedures/Studies: DG Chest 2 View  Result Date: 07/04/2020 CLINICAL DATA:  Fatigue EXAM: CHEST - 2 VIEW COMPARISON:  October 17, 2018 FINDINGS: There is a multi lead left-sided pacemaker/ICD in place. Heart size remains stable but enlarged. There is no pneumothorax. No large pleural effusion. There is an apparent new infrahilar opacity on the lateral view. A correlate abnormality on the frontal view is not well appreciated. IMPRESSION: Apparent new infrahilar opacity best visualized on the lateral view. This may represent an area of atelectasis, infiltrate, versus less likely adenopathy. Consider further evaluation with a contrast enhanced CT or a outpatient two-view chest x-ray in 4-6 weeks. Electronically Signed   By: Constance Holster M.D.   On: 07/04/2020 20:02   CT HEAD WO CONTRAST  Result Date: 07/05/2020 CLINICAL DATA:  Altered mental status with increased fatigue and shortness of breath. EXAM: CT HEAD WITHOUT CONTRAST TECHNIQUE: Contiguous axial images were obtained from the base of the skull through the vertex without intravenous contrast. COMPARISON:  None. FINDINGS: Brain: There is mild to moderate severity cerebral  atrophy with widening of the extra-axial spaces and ventricular dilatation. There are areas of decreased attenuation within the white matter tracts of the supratentorial brain, consistent with microvascular disease changes. Vascular: No hyperdense vessel or unexpected calcification. Skull: Normal. Negative for fracture or focal lesion.  Sinuses/Orbits: No acute finding. Other: None. IMPRESSION: 1. Generalized cerebral atrophy. 2. No acute intracranial abnormality. Electronically Signed   By: Virgina Norfolk M.D.   On: 07/05/2020 02:29   CT Chest W Contrast  Result Date: 07/04/2020 CLINICAL DATA:  Abnormal chest radiograph, increasing fatigue and shortness of breath, recent medication changes EXAM: CT CHEST WITH CONTRAST TECHNIQUE: Multidetector CT imaging of the chest was performed during intravenous contrast administration. CONTRAST:  24mL OMNIPAQUE IOHEXOL 300 MG/ML  SOLN COMPARISON:  Chest radiograph 07/04/2020 FINDINGS: Cardiovascular: Cardiac size is upper limits normal. Three-vessel coronary artery calcifications are present. Calcifications also present on the mitral annulus and minimally on the aortic leaflets. Pacer/defibrillator leads are positioned at the right atrium and cardiac apex with a left chest wall battery pack and leads approaching via the left subclavian vein. Atherosclerotic plaque within the normal caliber aorta. No acute luminal abnormality of the imaged aorta. No periaortic stranding or hemorrhage. Normal 3 vessel branching of the aortic arch. Proximal great vessels are mildly calcified but otherwise unremarkable. Central pulmonary arteries are upper limits normal caliber. No large central or lobar filling defects on this non tailored examination of the pulmonary arteries. Mediastinum/Nodes: No mediastinal fluid or gas. Normal thyroid gland and thoracic inlet. No acute abnormality of the trachea or esophagus. No worrisome mediastinal, hilar or axillary adenopathy. Lungs/Pleura: There is a background of moderate centrilobular emphysema demonstrating an apical predominance. Some atelectatic changes are present in the dependent portions of the lungs. There is diffuse mild airways thickening and scattered secretions with some more focal reticular opacity noted in the right lower lobe which corresponds well to the area  developing opacity on chest radiography. No pneumothorax or visible effusion. No convincing features of edema. Upper Abdomen: Calcified gallstones are present within the gallbladder body and neck with a prominent fold in the proximal gallbladder as well. No pericholecystic fluid or inflammation. No visible intraductal gallstones or biliary ductal dilatation. Small accessory splenules are noted. Mild bilateral perinephric stranding is nonspecific. Probable fluid attenuation cyst noted in the upper pole left kidney measuring up to 1.6 cm though incompletely included within the level of imaging. Musculoskeletal: Multilevel degenerative changes are present in the imaged portions of the spine. Additional mild degenerative changes in the shoulders. No acute osseous abnormality or suspicious osseous lesion. Left chest wall battery pack, as above. Small ovoid hypoattenuating intradermal lesion in the posterior soft tissues to the left of midline measuring up to 2.2 cm, likely reflecting a dermal inclusion cyst (2/114). No worrisome chest wall lesions. IMPRESSION: 1. Diffuse mild airways thickening and scattered secretions with some more focal reticular opacity in the right lower lobe which corresponds well to the area developing opacity on chest radiography. Could reflect a developing an early infectious/inflammatory process. Consider follow-up imaging in 3 months with noncontrast CT to assess for resolution. 2. Cholelithiasis without evidence of acute cholecystitis. 3. Mild bilateral perinephric stranding, nonspecific. Could correlate with age or diminished renal function. 4. Three-vessel coronary artery atherosclerosis. 5. Mitral annular and aortic leaflet calcifications. 6.  Aortic Atherosclerosis (ICD10-I70.0). 7. Emphysema (ICD10-J43.9). Electronically Signed   By: Lovena Le M.D.   On: 07/04/2020 22:44   CT ABDOMEN PELVIS W CONTRAST  Result Date: 07/05/2020 CLINICAL DATA:  Abdominal pain. EXAM: CT ABDOMEN AND  PELVIS WITH CONTRAST TECHNIQUE: Multidetector CT imaging of the abdomen and pelvis was performed using the standard protocol following bolus administration of intravenous contrast. CONTRAST:  159mL OMNIPAQUE IOHEXOL 300 MG/ML  SOLN COMPARISON:  None. FINDINGS: Lower chest: There is mild cardiomegaly. Hepatobiliary: No focal liver abnormality is seen. Subcentimeter gallstones are seen within the gallbladder lumen without evidence of gallbladder wall thickening or biliary dilatation. Pancreas: Unremarkable. No pancreatic ductal dilatation or surrounding inflammatory changes. Spleen: Normal in size without focal abnormality. Adrenals/Urinary Tract: Adrenal glands are unremarkable. Kidneys are normal in size, without renal calculi or hydronephrosis. A 1.7 cm simple cyst is seen within the posteromedial aspect of the mid right kidney. Contrast is seen within the lumen of a mildly distended urinary bladder. This is increased in attenuation within the dependent portion of the bladder. Stomach/Bowel: Stomach is within normal limits. Appendix appears normal. No evidence of bowel wall thickening, distention, or inflammatory changes. Noninflamed diverticula are seen throughout the large bowel. Vascular/Lymphatic: There is marked severity calcification of the abdominal aorta and bilateral common iliac arteries, without evidence of aneurysmal dilatation or dissection. No enlarged abdominal or pelvic lymph nodes. Reproductive: The prostate gland is markedly enlarged. Other: No abdominal wall hernia or abnormality. No abdominopelvic ascites. Musculoskeletal: Multilevel degenerative changes seen throughout the lumbar spine. IMPRESSION: 1. Cholelithiasis without evidence of cholecystitis. 2. Noninflamed diverticula throughout the large bowel. 3. Markedly enlarged prostate gland. 4. Aortic atherosclerosis. 5. 1.7 cm right renal cyst. Aortic Atherosclerosis (ICD10-I70.0). Electronically Signed   By: Virgina Norfolk M.D.   On:  07/05/2020 02:36   ECHOCARDIOGRAM COMPLETE  Result Date: 07/05/2020    ECHOCARDIOGRAM REPORT   Patient Name:   Mike Gray Date of Exam: 07/05/2020 Medical Rec #:  [id number]     Height:       72.0 in Accession #:    [id number]    Weight:       199.8 lb Date of Birth:  01/16/1941    BSA:          2.130 m Patient Age:    41 years      BP:           160/87 mmHg Patient Gender: M             HR:           53 bpm. Exam Location:  Inpatient Procedure: 2D Echo Indications:    Dyspnea R06.00  History:        Patient has prior history of Echocardiogram examinations, most                 recent 08/11/2011. CHF and Hypertrophic Cardiomyopathy,                 Pacemaker, COPD, Arrythmias:Atrial Fibrillation; Risk                 Factors:Hypertension.  Sonographer:    Mikki Santee RDCS (AE) Referring Phys: [id number] [place]  1. Left ventricular ejection fraction, by estimation, is 40 to 45%. The left ventricle has mildly decreased function. The left ventricle demonstrates global hypokinesis. There is moderate asymmetric left ventricular hypertrophy of the septal segment. Left ventricular diastolic function could not be evaluated.  2. Right ventricular systolic function is normal. The right ventricular size is normal. A n AICD wire is visualized.  3. The mitral valve is abnormal. Trivial mitral valve regurgitation.  4. The aortic valve is tricuspid. Aortic valve regurgitation is  not visualized. Mild to moderate aortic valve sclerosis/calcification is present, without any evidence of aortic stenosis.  5. Aortic dilatation noted. There is mild dilatation of the ascending aorta, measuring 38 mm.  6. The inferior vena cava is dilated in size with >50% respiratory variability, suggesting right atrial pressure of 8 mmHg. Comparison(s): 08/11/2011: Report reviewed, LVEF reportedly normal at 65%. FINDINGS  Left Ventricle: Left ventricular ejection fraction, by estimation, is 40 to 45%. The left ventricle has  mildly decreased function. The left ventricle demonstrates global hypokinesis. The left ventricular internal cavity size was normal in size. There is  moderate asymmetric left ventricular hypertrophy of the septal segment. Abnormal (paradoxical) septal motion, consistent with RV pacemaker. Left ventricular diastolic function could not be evaluated due to atrial fibrillation. Left ventricular diastolic  function could not be evaluated. Right Ventricle: The right ventricular size is normal. No increase in right ventricular wall thickness. Right ventricular systolic function is normal. Left Atrium: Left atrial size was normal in size. Right Atrium: Right atrial size was normal in size. Pericardium: There is no evidence of pericardial effusion. Mitral Valve: The mitral valve is abnormal. Mild to moderate mitral annular calcification. Trivial mitral valve regurgitation. Tricuspid Valve: The tricuspid valve is grossly normal. Tricuspid valve regurgitation is trivial. Aortic Valve: The aortic valve is tricuspid. Aortic valve regurgitation is not visualized. Mild to moderate aortic valve sclerosis/calcification is present, without any evidence of aortic stenosis. Pulmonic Valve: The pulmonic valve was normal in structure. Pulmonic valve regurgitation is not visualized. Aorta: Aortic dilatation noted. There is mild dilatation of the ascending aorta, measuring 38 mm. Venous: The inferior vena cava is dilated in size with greater than 50% respiratory variability, suggesting right atrial pressure of 8 mmHg. IAS/Shunts: No atrial level shunt detected by color flow Doppler. Additional Comments: A n AICD wire is visualized.  LEFT VENTRICLE PLAX 2D LVIDd:         4.92 cm LVIDs:         3.29 cm LV PW:         1.39 cm LV IVS:        1.39 cm LVOT diam:     2.00 cm LV SV:         72 LV SV Index:   34 LVOT Area:     3.14 cm  RIGHT VENTRICLE RV S prime:     13.20 cm/s TAPSE (M-mode): 1.6 cm LEFT ATRIUM              Index       RIGHT  ATRIUM           Index LA diam:        3.90 cm  1.83 cm/m  RA Area:     20.10 cm LA Vol (A2C):   104.0 ml 48.83 ml/m RA Volume:   52.80 ml  24.79 ml/m LA Vol (A4C):   53.4 ml  25.07 ml/m LA Biplane Vol: 76.3 ml  35.83 ml/m  AORTIC VALVE LVOT Vmax:   147.00 cm/s LVOT Vmean:  99.600 cm/s LVOT VTI:    0.230 m  AORTA Ao Root diam: 3.10 cm MITRAL VALVE MV Area (PHT): 2.34 cm     SHUNTS MV Decel Time: 324 msec     Systemic VTI:  0.23 m MV E velocity: 141.00 cm/s  Systemic Diam: 2.00 cm MV A velocity: 61.60 cm/s MV E/A ratio:  2.29 Lyman Bishop MD Electronically signed by Lyman Bishop MD Signature Date/Time: 07/05/2020/3:45:05 PM    Final  Procedure: Electrical Cardioversion Indications:  Atrial Fibrillation  Procedure Details:  Consent: Risks of procedure as well as the alternatives and risks of each were explained to the (patient/caregiver).  Consent for procedure obtained.  Time Out: Verified patient identification, verified procedure, site/side was marked, verified correct patient position, special equipment/implants available, medications/allergies/relevent history reviewed, required imaging and test results available.  Performed  Patient placed on cardiac monitor, pulse oximetry, supplemental oxygen as necessary.  Sedation given: Versed 2 mg + Fentanul 100 mcg IV, Dr. Elsworth Soho Pacer pads placed anterior and posterior chest.  Cardioverted 1 time(s).  Cardioversion with synchronized biphasic 120J shock.  Evaluation: Findings: Post procedure EKG shows: NSR Complications: None Patient did tolerate procedure well. Normal ICD function post shock. Atrial preference pacing programmed "on". Continue IV amiodarone overnight. DC home on PO amiodarone and follow up w AFib clinic and [name].  Time Spent Directly with the Patient:  30 minutes   Mihai Croitoru 07/08/2020, 2:33 PM  Subjective: Feels well, no dyspnea or chest pain. Very eager to go home.  Discharge Exam: Vitals:    07/09/20 1037 07/09/20 1040  BP: 139/75 139/75  Pulse: 100 96  Resp:  13  Temp:    SpO2:  93%   General: Pt is alert, awake, not in acute distress Cardiovascular: Regular, paced, S1/S2 +, no rubs, no gallops Respiratory: Nonlabored, clear Abdominal: Soft, NT, ND, bowel sounds + Extremities: No pitting edema, no cyanosis  Labs: BNP (last 3 results) Recent Labs    07/04/20 1948  BNP 938.1*   Basic Metabolic Panel: Recent Labs  Lab 07/05/20 0422 07/06/20 0252 07/07/20 0258 07/08/20 0808 07/09/20 0254  NA 139 135 138 135 138  K 4.5 4.1 3.9 3.8 3.7  CL 94* 89* 90* 94* 93*  CO2 34* 32 28 31 33*  GLUCOSE 116* 146* 133* 99 110*  BUN 32* 40* 60* 55* 47*  CREATININE 1.04 1.19 1.43* 1.18 1.27*  CALCIUM 9.4 9.4 9.5 8.8* 9.2  MG  --  1.7 1.6* 2.2 2.1   Liver Function Tests: Recent Labs  Lab 07/04/20 1948 07/06/20 0252  AST 30 29  ALT 30 35  ALKPHOS 48 41  BILITOT 0.9 0.6  PROT 7.2 6.4*  ALBUMIN 4.1 3.5   No results for input(s): LIPASE, AMYLASE in the last 168 hours. Recent Labs  Lab 07/06/20 0252  AMMONIA 19   CBC: Recent Labs  Lab 07/04/20 1948 07/05/20 0422 07/06/20 0252 07/07/20 0258  WBC 10.9* 8.6 13.1* 18.4*  NEUTROABS 8.9*  --  12.1* 17.1*  HGB 16.6 15.8 15.8 15.8  HCT 51.2 49.8 49.4 46.7  MCV 95.9 97.3 95.9 92.1  PLT 208 208 232 231   Cardiac Enzymes: No results for input(s): CKTOTAL, CKMB, CKMBINDEX, TROPONINI in the last 168 hours. BNP: Invalid input(s): POCBNP CBG: Recent Labs  Lab 07/08/20 1328  GLUCAP 113*   D-Dimer No results for input(s): DDIMER in the last 72 hours. Hgb A1c No results for input(s): HGBA1C in the last 72 hours. Lipid Profile No results for input(s): CHOL, HDL, LDLCALC, TRIG, CHOLHDL, LDLDIRECT in the last 72 hours. Thyroid function studies No results for input(s): TSH, T4TOTAL, T3FREE, THYROIDAB in the last 72 hours.  Invalid input(s): FREET3 Anemia work up No results for input(s): VITAMINB12, FOLATE,  FERRITIN, TIBC, IRON, RETICCTPCT in the last 72 hours. Urinalysis    Component Value Date/Time   COLORURINE YELLOW 07/05/2020 0011   APPEARANCEUR CLEAR 07/05/2020 0011   LABSPEC 1.024 07/05/2020 0011   PHURINE 6.0  07/05/2020 0011   GLUCOSEU NEGATIVE 07/05/2020 0011   HGBUR NEGATIVE 07/05/2020 0011   BILIRUBINUR NEGATIVE 07/05/2020 0011   KETONESUR NEGATIVE 07/05/2020 0011   PROTEINUR 100 (A) 07/05/2020 0011   NITRITE NEGATIVE 07/05/2020 0011   LEUKOCYTESUR NEGATIVE 07/05/2020 0011    Microbiology Recent Results (from the past 240 hour(s))  SARS Coronavirus 2 by RT PCR (hospital order, performed in [facility] hospital lab) Nasopharyngeal Nasopharyngeal Swab     Status: None   Collection Time: 07/04/20  8:19 PM   Specimen: Nasopharyngeal Swab  Result Value Ref Range Status   SARS Coronavirus 2 NEGATIVE NEGATIVE Final    Comment: (NOTE) SARS-CoV-2 target nucleic acids are NOT DETECTED.  The SARS-CoV-2 RNA is generally detectable in upper and lower respiratory specimens during the acute phase of infection. The lowest concentration of SARS-CoV-2 viral copies this assay can detect is 250 copies / mL. A negative result does not preclude SARS-CoV-2 infection and should not be used as the sole basis for treatment or other patient management decisions.  A negative result may occur with improper specimen collection / handling, submission of specimen other than nasopharyngeal swab, presence of viral mutation(s) within the areas targeted by this assay, and inadequate number of viral copies (<250 copies / mL). A negative result must be combined with clinical observations, patient history, and epidemiological information.  Fact Sheet for Patients:   StrictlyIdeas.no  Fact Sheet for Healthcare Providers: BankingDealers.co.za  This test is not yet approved or  cleared by the [place] FDA and has been authorized for detection and/or  diagnosis of SARS-CoV-2 by FDA under an Emergency Use Authorization (EUA).  This EUA will remain in effect (meaning this test can be used) for the duration of the COVID-19 declaration under Section 564(b)(1) of the Act, 21 U.S.C. section 360bbb-3(b)(1), unless the authorization is terminated or revoked sooner.  Performed at [facility], [place] [address], [place], [place] [address]   MRSA PCR Screening     Status: None   Collection Time: 07/06/20 12:15 AM   Specimen: Nasal Mucosa; Nasopharyngeal  Result Value Ref Range Status   MRSA by PCR NEGATIVE NEGATIVE Final    Comment:        The GeneXpert MRSA Assay (FDA approved for NASAL specimens only), is one component of a comprehensive MRSA colonization surveillance program. It is not intended to diagnose MRSA infection nor to guide or monitor treatment for MRSA infections. Performed at [facility], [place] [address] [place], [place] [address]     Time coordinating discharge: Approximately 40 minutes  [place] Pour, MD  [facility] 07/09/2020, 10:59 AM

## 2020-07-09 NOTE — Progress Notes (Addendum)
Progress Note  Patient Name: Mike Gray Date of Encounter: 07/09/2020  Primary Cardiologist: Will [name], MD   Subjective   Wants to go home. V-pacing on telemetry. Rates 90-100's. No chest pain.   Inpatient Medications    Scheduled Meds: . apixaban  5 mg Oral BID  . chlorhexidine  15 mL Mouth Rinse BID  . Chlorhexidine Gluconate Cloth  6 each Topical Daily  . divalproex  1,000 mg Oral Daily  . ipratropium-albuterol  3 mL Nebulization TID  . mouth rinse  15 mL Mouth Rinse q12n4p  . methylPREDNISolone (SOLU-MEDROL) injection  40 mg Intravenous Daily  . mometasone-formoterol  2 puff Inhalation BID  . polyethylene glycol  17 g Oral Daily  . tamsulosin  0.4 mg Oral QPC supper   Continuous Infusions: . amiodarone 30 mg/hr (07/09/20 0800)   PRN Meds: albuterol, senna-docusate   Vital Signs    Vitals:   07/09/20 0600 07/09/20 0700 07/09/20 0734 07/09/20 0800  BP:   117/71 (!) 131/99  Pulse: 99 100 86 60  Resp: (!) 21 13 10  (!) 27  Temp:    97.9 F (36.6 C)  TempSrc:    Oral  SpO2: 94% 92% 90% 92%  Weight:      Height:        Intake/Output Summary (Last 24 hours) at 07/09/2020 0859 Last data filed at 07/09/2020 0800 Gross per 24 hour  Intake 1235.68 ml  Output 650 ml  Net 585.68 ml   Filed Weights   07/07/20 0500 07/08/20 0500 07/09/20 0500  Weight: 77.6 kg 77.9 kg 78.3 kg    Physical Exam   General: Well developed, well nourished, NAD Neck: Negative for carotid bruits. No JVD Lungs:Clear to ausculation bilaterally. No wheezes, rales, or rhonchi. Breathing is unlabored. Cardiovascular: RRR with S1 S2. No murmurs Abdomen: Soft, non-tender, non-distended. No obvious abdominal masses. Extremities: No edema. Radial pulses 2+ bilaterally Neuro: Alert and oriented. No focal deficits. No facial asymmetry. MAE spontaneously. Psych: Responds to questions appropriately with normal affect.    Labs    Chemistry Recent Labs  Lab 07/04/20 1948  07/05/20 0422 07/06/20 0252 07/06/20 0252 07/07/20 0258 07/08/20 0808 07/09/20 0254  NA 140   < > 135   < > 138 135 138  K 4.1   < > 4.1   < > 3.9 3.8 3.7  CL 97*   < > 89*   < > 90* 94* 93*  CO2 31   < > 32   < > 28 31 33*  GLUCOSE 127*   < > 146*   < > 133* 99 110*  BUN 32*   < > 40*   < > 60* 55* 47*  CREATININE 1.03   < > 1.19   < > 1.43* 1.18 1.27*  CALCIUM 9.9   < > 9.4   < > 9.5 8.8* 9.2  PROT 7.2  --  6.4*  --   --   --   --   ALBUMIN 4.1  --  3.5  --   --   --   --   AST 30  --  29  --   --   --   --   ALT 30  --  35  --   --   --   --   ALKPHOS 48  --  41  --   --   --   --   BILITOT 0.9  --  0.6  --   --   --   --  GFRNONAA >60   < > 58*   < > 47* 59* 54*  GFRAA >60   < > >60   < > 54* >60 >60  ANIONGAP 12   < > 14   < > 20* 10 12   < > = values in this interval not displayed.     Hematology Recent Labs  Lab 07/05/20 0422 07/06/20 0252 07/07/20 0258  WBC 8.6 13.1* 18.4*  RBC 5.12 5.15 5.07  HGB 15.8 15.8 15.8  HCT 49.8 49.4 46.7  MCV 97.3 95.9 92.1  MCH 30.9 30.7 31.2  MCHC 31.7 32.0 33.8  RDW 14.0 13.6 14.1  PLT 208 232 231    Cardiac EnzymesNo results for input(s): TROPONINI in the last 168 hours. No results for input(s): TROPIPOC in the last 168 hours.   BNP Recent Labs  Lab 07/04/20 1948  BNP 794.8*    DDimer No results for input(s): DDIMER in the last 168 hours.   Radiology    No results found.  Telemetry    07/09/20 NSR with V-pacing, rates in the 90-100 range- Personally Reviewed  ECG    No new tracing as of 07/09/20  - Personally Reviewed  Cardiac Studies   Echo 07/05/20: 1. Left ventricular ejection fraction, by estimation, is 40 to 45%. The  left ventricle has mildly decreased function. The left ventricle  demonstrates global hypokinesis. There is moderate asymmetric left  ventricular hypertrophy of the septal segment.  Left ventricular diastolic function could not be evaluated.  2. Right ventricular systolic function is  normal. The right ventricular  size is normal. A n AICD wire is visualized.  3. The mitral valve is abnormal. Trivial mitral valve regurgitation.  4. The aortic valve is tricuspid. Aortic valve regurgitation is not  visualized. Mild to moderate aortic valve sclerosis/calcification is  present, without any evidence of aortic stenosis.  5. Aortic dilatation noted. There is mild dilatation of the ascending  aorta, measuring 38 mm.  6. The inferior vena cava is dilated in size with >50% respiratory  variability, suggesting right atrial pressure of 8 mmHg.   Patient Profile     79 y.o. male with a hx of HCM, NSVT, CHB s/p ICD (VT and HCM), PAF on eliquis, HTN, and COPD admitted with respiratory distress, CHF exacerbation, persistent AFib since July.  Assessment & Plan    1. 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  -On PTA Hyzaar 100/12.5   3.  Persistent atrial fibrillation: -Heart failure symptoms exacerbated by persistently uncontrolled atrial fibrillation -Compliant with anticoagulation -Underwent DCCV cardioversion per Dr. Sallyanne Kuster 07/08/2020 -Continue with current regimen>>transisiton to PO Amiodarone -Will need to restart PTA beta blocker>>at lower dose  -Maintaining NSR with V pacing  -Continue Eliquis 5 mg p.o. twice daily  4.  Acute hypoxia/hypercapnic acute respiratory failure: -Stabilized, maintaining adequate O2 saturations on room air  5. CAD: -Diffuse coronary calcifications with no anginal symptoms and newly reduced LVEF -Plan for further ischemic evaluation, likely coronary angiography however will need to be delayed  approximately 30 days after TEE to avoid interruption in anticoagulation -Denies anginal symptoms -On PTA beta blocker   5.  NSVT/CHB s/p PPM/ICD: -Stable, on home high-dose  beta-blocker -No current issues -Transition to PO amiodarone  Signed, Kathyrn Drown NP-C [facility] Pager: [phone] 07/09/2020, 8:59 AM     For questions or updates, please contact   Please consult [email or web address] for contact info under Cardiology/STEMI.  I have seen and examined the patient along with Kathyrn Drown NP-C.  I have reviewed the chart, notes and new data.  I agree with their note.  Key new complaints: feels much better, lying fully flat in bed without dyspnea Key examination changes: clear lungs, RRR with occasional ectopy, no murmurs Key new findings / data: creat 1.27 is marginally higher than yesterday, but better than 1.43 two days ago.  PLAN:  I do not think he will need daily loop diuretic since his decompensation was due to 2 months of persistent AFib.  [facility] will sign off.   Medication Recommendations:   - restart losartan HCTZ 100-12.5 - continue metoprolol, apixaban and atorvastatin in previous doses - amiodarone 400 mg daily for 2 weeks, then 200 mg daily Other recommendations (labs, testing, etc):  Monitor daily weights and call for weight gain >3 lb/24h or 5 lb/week. Follow up as an outpatient:  AFib clinic or [name] in 2 weeks.      Sanda Klein, MD, [place] [phone] 07/09/2020, 10:16 AM

## 2020-07-09 NOTE — Plan of Care (Signed)
This RN will continue to monitor patient's progression of care plan.  

## 2020-07-09 NOTE — Progress Notes (Signed)
Bladder scan showed 466 mLs; Dr. Bonner Puna notified by this RN. Orders received to place foley catheter with leg bag for discharge. (Will be removed at patient's next appointment.) 570 mLs of urine emptied after catheterization. Pt being prepared for discharge.

## 2020-07-09 NOTE — Progress Notes (Signed)
AVS instructions reviewed by this RN with patient and his brother at bedside. All questions answered by RN at this time, and pt belongings returned. Education and careplan resolved, and PIVs removed. Pt taken down to lobby in wheelchair by RN where patient's brother [name] was waiting with the car.

## 2020-07-09 NOTE — Progress Notes (Signed)
Pt occasionally desats to high 70's -80s when sleeping. Dr. Bonner Puna notified by this RN. Since pt sats in high 90's on RA when awake, pt does not need home O2 w/ discharge per MD. Pt will be undergoing an outpatient sleep study after discharge. RN will continue to monitor pt.

## 2020-07-10 ENCOUNTER — Other Ambulatory Visit: Payer: Self-pay

## 2020-07-10 ENCOUNTER — Encounter (HOSPITAL_COMMUNITY): Payer: Self-pay | Admitting: Emergency Medicine

## 2020-07-10 ENCOUNTER — Emergency Department (HOSPITAL_COMMUNITY)
Admission: EM | Admit: 2020-07-10 | Discharge: 2020-07-10 | Disposition: A | Discharge status: Home / Self Care | Payer: Medicare Other | Attending: Emergency Medicine | Admitting: Emergency Medicine

## 2020-07-10 DIAGNOSIS — I2510 Atherosclerotic heart disease of native coronary artery without angina pectoris: Secondary | ICD-10-CM | POA: Diagnosis not present

## 2020-07-10 DIAGNOSIS — I5022 Chronic systolic (congestive) heart failure: Secondary | ICD-10-CM | POA: Insufficient documentation

## 2020-07-10 DIAGNOSIS — Z87891 Personal history of nicotine dependence: Secondary | ICD-10-CM | POA: Insufficient documentation

## 2020-07-10 DIAGNOSIS — I442 Atrioventricular block, complete: Secondary | ICD-10-CM | POA: Diagnosis not present

## 2020-07-10 DIAGNOSIS — I4891 Unspecified atrial fibrillation: Secondary | ICD-10-CM | POA: Diagnosis not present

## 2020-07-10 DIAGNOSIS — Z79899 Other long term (current) drug therapy: Secondary | ICD-10-CM | POA: Diagnosis not present

## 2020-07-10 DIAGNOSIS — Z950 Presence of cardiac pacemaker: Secondary | ICD-10-CM | POA: Diagnosis not present

## 2020-07-10 DIAGNOSIS — F319 Bipolar disorder, unspecified: Secondary | ICD-10-CM | POA: Diagnosis not present

## 2020-07-10 DIAGNOSIS — I110 Hypertensive heart disease with heart failure: Secondary | ICD-10-CM | POA: Insufficient documentation

## 2020-07-10 DIAGNOSIS — F17210 Nicotine dependence, cigarettes, uncomplicated: Secondary | ICD-10-CM | POA: Diagnosis not present

## 2020-07-10 DIAGNOSIS — J441 Chronic obstructive pulmonary disease with (acute) exacerbation: Secondary | ICD-10-CM | POA: Insufficient documentation

## 2020-07-10 DIAGNOSIS — R339 Retention of urine, unspecified: Secondary | ICD-10-CM | POA: Diagnosis not present

## 2020-07-10 DIAGNOSIS — G9341 Metabolic encephalopathy: Secondary | ICD-10-CM | POA: Diagnosis not present

## 2020-07-10 DIAGNOSIS — I422 Other hypertrophic cardiomyopathy: Secondary | ICD-10-CM | POA: Diagnosis not present

## 2020-07-10 DIAGNOSIS — R319 Hematuria, unspecified: Secondary | ICD-10-CM | POA: Diagnosis present

## 2020-07-10 DIAGNOSIS — K5520 Angiodysplasia of colon without hemorrhage: Secondary | ICD-10-CM | POA: Diagnosis not present

## 2020-07-10 DIAGNOSIS — Z7901 Long term (current) use of anticoagulants: Secondary | ICD-10-CM | POA: Diagnosis not present

## 2020-07-10 DIAGNOSIS — J9602 Acute respiratory failure with hypercapnia: Secondary | ICD-10-CM | POA: Diagnosis not present

## 2020-07-10 DIAGNOSIS — Z95810 Presence of automatic (implantable) cardiac defibrillator: Secondary | ICD-10-CM | POA: Diagnosis not present

## 2020-07-10 DIAGNOSIS — R5381 Other malaise: Secondary | ICD-10-CM | POA: Diagnosis not present

## 2020-07-10 LAB — URINALYSIS, ROUTINE W REFLEX MICROSCOPIC
Bilirubin Urine: NEGATIVE
Glucose, UA: NEGATIVE mg/dL
Ketones, ur: NEGATIVE mg/dL
Leukocytes,Ua: NEGATIVE
Nitrite: NEGATIVE
Protein, ur: 100 mg/dL — AB
RBC / HPF: >50 RBC/hpf — ABNORMAL HIGH (ref 0–5)
Specific Gravity, Urine: 1.014 (ref 1.005–1.030)
pH: 6 (ref 5.0–8.0)

## 2020-07-10 MED ORDER — LIDOCAINE HCL URETHRAL/MUCOSAL 2 % EX GEL
1.0000 "application " | Freq: Once | CUTANEOUS | Status: DC | PRN
  Filled 2020-07-10: qty 30

## 2020-07-10 NOTE — ED Triage Notes (Signed)
Patient arrives with urinary retention. Patient states that his catheter wasn't working so he cut it of the bag and states the rest of the catheter fell out. Patient states he was attempting to remove it without deflating the balloon so he cut it.

## 2020-07-10 NOTE — ED Provider Notes (Signed)
[facility] DEPT Provider Note   CSN: [id number] Arrival date & time: 07/10/20  1856     History Chief Complaint  Patient presents with  . Urinary Retention  . Hematuria    Mike Gray is a 79 y.o. male.   Hematuria This is a new problem. The current episode started 6 to 12 hours ago. The problem occurs constantly. The problem has been resolved. Pertinent negatives include no chest pain, no abdominal pain, no headaches and no shortness of breath. Nothing aggravates the symptoms. Relieved by: foley placement. The treatment provided significant relief.       Past Medical History:  Diagnosis Date  . Angiodysplasia   . Atrial fibrillation ([place])   . CHF (congestive heart failure) ([place])   . COPD (chronic obstructive pulmonary disease) ([place])   . GI bleed   . Hypertension     Patient Active Problem List   Diagnosis Date Noted  . 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 COPD with acute exacerbation ([place]) 07/05/2020  . Bipolar disorder ([place]) 07/05/2020  . Unspecified atrial fibrillation ([place]) 07/05/2020  . Chronic systolic CHF (congestive heart failure) ([place]) 07/05/2020  . Hypertrophic cardiomyopathy ([place]) 07/05/2020  . History of complete heart block 07/05/2020  . ICD (implantable cardioverter-defibrillator) in place 07/05/2020  . Acute metabolic encephalopathy 85/46/2703    Past Surgical History:  Procedure Laterality Date  . COLONOSCOPY    . PACEMAKER IMPLANT         Family History  Problem Relation Age of Onset  . Healthy Mother   . Breast cancer Father 21  . Cancer Other     Social History   Tobacco Use  . Smoking status: Former Smoker    Types: Cigarettes    Quit date: 2013    Years since quitting: 8.7  . Smokeless tobacco: Never Used  Vaping Use  . Vaping Use: Never used  Substance Use Topics  . Alcohol use: Not Currently  . Drug use: Never    Home Medications Prior to Admission  medications   Medication Sig Start Date End Date Taking? Authorizing Provider  amiodarone (PACERONE) 200 MG tablet Take 2 tablets (400 mg total) by mouth daily for 14 days, THEN 1 tablet (200 mg total) daily for 14 days. and continue 200mg  daily. 07/10/20 08/07/20  Patrecia Pour, MD  apixaban (ELIQUIS) 5 MG TABS tablet Take 1 tablet (5 mg total) by mouth 2 (two) times daily. 10/13/19   Camnitz, Ocie Doyne, MD  atorvastatin (LIPITOR) 20 MG tablet Take 20 mg by mouth daily.    [provider]  divalproex (DEPAKOTE ER) 500 MG 24 hr tablet Take 1,000 mg by mouth daily.  10/20/19   [provider]  losartan-hydrochlorothiazide (HYZAAR) 100-12.5 MG tablet Take 1 tablet by mouth daily.    [provider]  metoprolol succinate (TOPROL-XL) 200 MG 24 hr tablet Take 1 tablet (200 mg total) by mouth daily. Take with or immediately following a meal. 02/27/20   Camnitz, Ocie Doyne, MD  tamsulosin (FLOMAX) 0.4 MG CAPS capsule Take 1 capsule (0.4 mg total) by mouth daily after supper. 07/09/20   Patrecia Pour, MD    Allergies    Patient has no known allergies.  Review of Systems   Review of Systems  Constitutional: Negative for chills and fever.  HENT: Negative for congestion and rhinorrhea.   Respiratory: Negative for cough and shortness of breath.   Cardiovascular: Negative for chest pain and palpitations.  Gastrointestinal: Negative for abdominal pain, diarrhea, nausea and vomiting.  Genitourinary: Positive for difficulty urinating and hematuria. Negative for dysuria.  Musculoskeletal: Negative for arthralgias and back pain.  Skin: Negative for color change and rash.  Neurological: Negative for light-headedness and headaches.    Physical Exam Updated Vital Signs BP (!) 136/94 (BP Location: Right Arm)   Pulse 87   Temp 98 F (36.7 C) (Oral)   Resp 16   Ht 5\' 7"  (1.702 m)   Wt 79.4 kg   SpO2 94%   BMI 27.41 kg/m   Physical Exam Vitals and nursing note reviewed.    Constitutional:      General: He is not in acute distress.    Appearance: Normal appearance.  HENT:     Head: Normocephalic and atraumatic.     Nose: No rhinorrhea.  Eyes:     General:        Right eye: No discharge.        Left eye: No discharge.     Conjunctiva/sclera: Conjunctivae normal.  Cardiovascular:     Rate and Rhythm: Normal rate and regular rhythm.  Pulmonary:     Effort: Pulmonary effort is normal.     Breath sounds: No stridor.  Abdominal:     General: Abdomen is flat. There is no distension.     Palpations: Abdomen is soft.     Tenderness: There is no right CVA tenderness or left CVA tenderness.  Musculoskeletal:        General: No deformity or signs of injury.  Skin:    General: Skin is warm and dry.  Neurological:     General: No focal deficit present.     Mental Status: He is alert. Mental status is at baseline.     Motor: No weakness.  Psychiatric:        Mood and Affect: Mood normal.        Behavior: Behavior normal.        Thought Content: Thought content normal.     ED Results / Procedures / Treatments   Labs (all labs ordered are listed, but only abnormal results are displayed) Labs Reviewed  URINALYSIS, ROUTINE W REFLEX MICROSCOPIC - Abnormal; Notable for the following components:      Result Value   Color, Urine AMBER (*)    APPearance CLOUDY (*)    Hgb urine dipstick LARGE (*)    Protein, ur 100 (*)    RBC / HPF >50 (*)    Bacteria, UA RARE (*)    All other components within normal limits    EKG None  Radiology No results found.  Procedures Procedures (including critical care time)  Medications Ordered in ED Medications  lidocaine (XYLOCAINE) 2 % jelly 1 application (has no administration in time range)    ED Course  I have reviewed the triage vital signs and the nursing notes.  Pertinent labs & imaging results that were available during my care of the patient were reviewed by me and considered in my medical decision  making (see chart for details).    MDM Rules/Calculators/A&P                          History of prostate disease, difficulty with urinating, recent hospitalization where is discharged with Foley catheter in place to follow-up with urology.  Had some leaking around it so decided to try and pull it out multiple times unsuccessfully, he then cut the catheter and  removed it.  Had urinary retention for the next several hours came here for evaluation nursing team in triage place a new Foley catheter there is hematuria, rare bacteria unlikely to be infectious with no dysuria fevers flank pain.  He needs outpatient urology follow-up.  Unlikely to have AKI secondary to this is a limited male time of retention.  He is requesting discharge and agrees he needs no further work-up or imaging at this time.  Strict return precautions are given Final Clinical Impression(s) / ED Diagnoses Final diagnoses:  Urinary retention    Rx / DC Orders ED Discharge Orders         Ordered    Ambulatory referral to Urology       Comments: [facility]   07/10/20 2200           Breck Coons, MD 07/10/20 2204

## 2020-07-12 ENCOUNTER — Telehealth: Payer: Self-pay | Admitting: Cardiology

## 2020-07-12 NOTE — Telephone Encounter (Signed)
Aware to obtain order from PCP. HH agreeable to plan.

## 2020-07-12 NOTE — Telephone Encounter (Signed)
Almira from [facility] is calling to get verbal orders to do at home Physical Therapy. If she does not answer please leave detailed VM.

## 2020-07-12 NOTE — Telephone Encounter (Signed)
lmtcb

## 2020-07-14 DIAGNOSIS — I4891 Unspecified atrial fibrillation: Secondary | ICD-10-CM | POA: Diagnosis not present

## 2020-07-14 DIAGNOSIS — G9341 Metabolic encephalopathy: Secondary | ICD-10-CM | POA: Diagnosis not present

## 2020-07-14 DIAGNOSIS — J441 Chronic obstructive pulmonary disease with (acute) exacerbation: Secondary | ICD-10-CM | POA: Diagnosis not present

## 2020-07-14 DIAGNOSIS — I110 Hypertensive heart disease with heart failure: Secondary | ICD-10-CM | POA: Diagnosis not present

## 2020-07-14 DIAGNOSIS — I5022 Chronic systolic (congestive) heart failure: Secondary | ICD-10-CM | POA: Diagnosis not present

## 2020-07-14 DIAGNOSIS — J9602 Acute respiratory failure with hypercapnia: Secondary | ICD-10-CM | POA: Diagnosis not present

## 2020-07-14 NOTE — Progress Notes (Addendum)
Cardiology Office Note Date:  07/14/2020  Patient ID:  Mike Gray, DOB [birth year], MRN [id number] PCP:  [name], MD  Cardiologist/Electrophysiologist: [name]    Chief Complaint: post hospital  History of Present Illness: Mike Gray is a 79 y.o. male with history of HCM, NSVT, CHB s/p ICD (VT and HCM), AFib, HTN, and COPD, Bipolar disorder (his brother helps him).  He comes in today to be seen for [name].  He was hospitalized at [facility] 07/04/20 with progressive SOB/DOE and weakness/fatigue.  Cardiology was consulted, noted: Device download 02/2020 with multiple NSVT episodes, increased from prior. Toprol was increased to 200 mg. Unfortunately he was unable to tolerate this dose of toprol and reduced back to 100 mg daily. The next remote transmission showed persistent atrial flutter and multiple (43) episodes of NSVT. Pt was scheduled to see [name] in [place] clinic on 07/07/20, but instead presented to [facility] with weakness/fatigue and dyspnea. He presented with his brother who is his primary care giver (bipolar disorder). Brother brought him in due to a decline in his condition for the past 2 weeks. Pt found to start smoking again after years of abstinence and increased somnolence.  In addition, patient has been incontinent to urine and stool. On arrival, he was hypoxic with O2 83% on room air. He required BiPAP. COVID-19 negative. CT head negative for acute abnormalities. CT chest with infection/inflammation. BNP was elevated to 795. ABG with respiratory acidosis.    Echocardiogram showed EF of 40-45%, previously normal-hyperdynamic, asymmetric LV hypertrophy, normal RV, aortic dilation of 3.8 cm, and elevated right atrial pressure He was in AF, LVEF reduction suspect/possibly 2/2 NSVT burden and/or his AF Dr. [name] noted: ICD interrogation shows onset of persistent AFib since early July, gradual decrease in thoracic impedance in last 4 weeks, sudden decline in activity in last several  days.. No recent VT. 98.2% V paced. DCCV on 07/08/20 Ultimately felt his HF decompensation was 2/2 months of Afib, not felt need to transition his HCTZ to loop diuretic He was started on amiodarone Discharged 07/09/20   TODAY He is accompanied by his brother who he lives with and helps him, manages his pills and so on. The patient denies any CP, palpitations. He remains SOB but they both attribute that to his COPD mostly.  When asked if better then when he went to the hospital they both feel he is. (he is noted to purse-lip breath) No syncope or near syncope.  He has some edema today that neither had noted and think probably is always there.  He even prior to his hospital stay was pending urology evaluation for difficulty with urinating, he now has a foley, saw urology this AM still unable to urinate and foley was replaced. His medications adjusted with plans to see him again in q week.  He mentions painful baldder spasms that they were told is not unusual for his condition. He may need a TURP or HOLEP and will likely need our clearance for this.  He does not have a pulmonologist  Device information MDT dual chamber ICD, RA lead is from 2007, RV lead is from 2015 Current device implanted 01/12/2014 He has abandoned RA and RV pacing leads and can not have MRIs   Past Medical History:  Diagnosis Date  . Angiodysplasia   . Atrial fibrillation ([place])   . CHF (congestive heart failure) ([place])   . COPD (chronic obstructive pulmonary disease) ([place])   . GI bleed   . Hypertension  Past Surgical History:  Procedure Laterality Date  . COLONOSCOPY    . PACEMAKER IMPLANT      Current Outpatient Medications  Medication Sig Dispense Refill  . amiodarone (PACERONE) 200 MG tablet Take 2 tablets (400 mg total) by mouth daily for 14 days, THEN 1 tablet (200 mg total) daily for 14 days. and continue 200mg  daily. 42 tablet 0  . apixaban (ELIQUIS) 5 MG TABS tablet Take 1 tablet (5 mg total) by  mouth 2 (two) times daily. 60 tablet 0  . atorvastatin (LIPITOR) 20 MG tablet Take 20 mg by mouth daily.    . divalproex (DEPAKOTE ER) 500 MG 24 hr tablet Take 1,000 mg by mouth daily.     [name] Kitchen losartan-hydrochlorothiazide (HYZAAR) 100-12.5 MG tablet Take 1 tablet by mouth daily.    . metoprolol succinate (TOPROL-XL) 200 MG 24 hr tablet Take 1 tablet (200 mg total) by mouth daily. Take with or immediately following a meal. 30 tablet 6  . tamsulosin (FLOMAX) 0.4 MG CAPS capsule Take 1 capsule (0.4 mg total) by mouth daily after supper. 30 capsule 0   No current facility-administered medications for this visit.    Allergies:   Patient has no known allergies.   Social History:  The patient  reports that he quit smoking about 8 years ago. His smoking use included cigarettes. He has never used smokeless tobacco. He reports previous alcohol use. He reports that he does not use drugs.   Family History:  The patient's family history includes Breast cancer (age of onset: 58) in his father; Cancer in an other family member; Healthy in his mother.  ROS:  Please see the history of present illness.    All other systems are reviewed and otherwise negative.   PHYSICAL EXAM:  VS:  There were no vitals taken for this visit. BMI: There is no height or weight on file to calculate BMI. Well nourished, well developed, in no acute distress HEENT: normocephalic, atraumatic Neck: no JVD, carotid bruits or masses Cardiac:  RRR; no significant murmurs, no rubs, or gallops Lungs:  Somewhat diminished throughout though moving air and are CTA b/l, no wheezing, rhonchi or rales Abd: soft, nontender MS: no deformity or atrophy Ext: 1+edema b/l to midshin Skin: warm and dry, no rash Neuro:  No gross deficits appreciated Psych: euthymic mood, full affect  ICD site is stable, no tethering or discomfort   EKG:  Not done today   Device interrogation done today and reviewed by myself:  Battery estimate is 34mo Lead  measurements are good 2 NSVT, no AF since his DCCV   Echo 07/05/20: 1. Left ventricular ejection fraction, by estimation, is 40 to 45%. The  left ventricle has mildly decreased function. The left ventricle  demonstrates global hypokinesis. There is moderate asymmetric left  ventricular hypertrophy of the septal segment.  Left ventricular diastolic function could not be evaluated.  2. Right ventricular systolic function is normal. The right ventricular  size is normal. A n AICD wire is visualized.  3. The mitral valve is abnormal. Trivial mitral valve regurgitation.  4. The aortic valve is tricuspid. Aortic valve regurgitation is not  visualized. Mild to moderate aortic valve sclerosis/calcification is  present, without any evidence of aortic stenosis.  5. Aortic dilatation noted. There is mild dilatation of the ascending  aorta, measuring 38 mm.  6. The inferior vena cava is dilated in size with >50% respiratory  variability, suggesting right atrial pressure of 8 mmHg.   2012, LVEF  67%, asymmetric septal hypertrophy, no SAM or outflow gradient Recent Labs: 07/04/2020: B Natriuretic Peptide 794.8 07/06/2020: ALT 35; TSH 0.315 07/07/2020: Hemoglobin 15.8; Platelets 231 07/09/2020: BUN 47; Creatinine, Ser 1.27; Magnesium 2.1; Potassium 3.7; Sodium 138  No results found for requested labs within last 8760 hours.   Estimated Creatinine Clearance: 48.4 mL/min (A) (by C-G formula based on SCr of 1.27 mg/dL (H)).   Wt Readings from Last 3 Encounters:  07/10/20 175 lb (79.4 kg)  07/09/20 172 lb 9.9 oz (78.3 kg)  10/13/19 199 lb 12.8 oz (90.6 kg)     Other studies reviewed: Additional studies/records reviewed today include: summarized above  ASSESSMENT AND PLAN:  1. NSVT     Recently started on amiodarone 2. HCM 3. New reduction in LVEF     Felt 2/2 prolonged AF 4. CHF exacerbation     Feeling better     OptiVol looks to have plateaued     He has edema ?chronic  4. AFib  (AFlutter noted on device), persistent     CHA2DS2Vasc is 4, on Eliquis, appropriately dosed     None since his DCCV on 07/08/20  5. HTN     Looks ok   His reduction LVEF likely 2/2 the AFib and would expect improvement with maintenance of SR He remains with some edema, will increase his HCTZ to 25mg  He CAN NOT interrupt his Eliquis for at least 30 days post DCCV 07/08/20, I discussed this with them BMET in a week He sees urology next week will know then if he will need prostate surgery Reduce amio to 200mg  daily 07/24/20 Discussed plan with [name]    Disposition: F/u with [name] in 3 weeks, sooner if needed    Current medicines are reviewed at length with the patient today.  The patient did not have any concerns regarding medicines.  Venetia Night, PA-C 07/14/2020 10:02 AM     [place] [address] [phone] (office)  [phone] (fax)

## 2020-07-15 ENCOUNTER — Other Ambulatory Visit: Payer: Self-pay

## 2020-07-15 ENCOUNTER — Ambulatory Visit (INDEPENDENT_AMBULATORY_CARE_PROVIDER_SITE_OTHER): Payer: Medicare Other | Admitting: Physician Assistant

## 2020-07-15 VITALS — BP 130/86 | HR 67 | Ht 68.0 in | Wt 171.0 lb

## 2020-07-15 DIAGNOSIS — I472 Ventricular tachycardia: Secondary | ICD-10-CM | POA: Diagnosis not present

## 2020-07-15 DIAGNOSIS — I4819 Other persistent atrial fibrillation: Secondary | ICD-10-CM

## 2020-07-15 DIAGNOSIS — R338 Other retention of urine: Secondary | ICD-10-CM | POA: Diagnosis not present

## 2020-07-15 DIAGNOSIS — I4729 Other ventricular tachycardia: Secondary | ICD-10-CM

## 2020-07-15 DIAGNOSIS — I422 Other hypertrophic cardiomyopathy: Secondary | ICD-10-CM | POA: Diagnosis not present

## 2020-07-15 DIAGNOSIS — N401 Enlarged prostate with lower urinary tract symptoms: Secondary | ICD-10-CM | POA: Diagnosis not present

## 2020-07-15 DIAGNOSIS — I5042 Chronic combined systolic (congestive) and diastolic (congestive) heart failure: Secondary | ICD-10-CM

## 2020-07-15 DIAGNOSIS — I5022 Chronic systolic (congestive) heart failure: Secondary | ICD-10-CM | POA: Diagnosis not present

## 2020-07-15 MED ORDER — LOSARTAN POTASSIUM-HCTZ 100-25 MG PO TABS: 1.0000 | ORAL_TABLET | Freq: Every day | ORAL | 1 refills | Status: DC

## 2020-07-15 MED ORDER — AMIODARONE HCL 200 MG PO TABS: ORAL_TABLET | ORAL | 0 refills | Status: DC

## 2020-07-15 NOTE — Patient Instructions (Signed)
Medication Instructions:   START TAKING LOSARTAN /HCTZ 100/25 MG ONCE A DAY   *If you need a refill on your cardiac medications before your next appointment, please call your pharmacy*   Lab Work: BMET [place]   If you have labs (blood work) drawn today and your tests are completely normal, you will receive your results only by: [name] Kitchen MyChart Message (if you have MyChart) OR . A paper copy in the mail If you have any lab test that is abnormal or we need to change your treatment, we will call you to review the results.   Testing/Procedures: NONE ORDERED  TODAY   Follow-Up: At [facility], you and your health needs are our priority.  As part of our continuing mission to provide you with exceptional heart care, we have created designated Provider Care Teams.  These Care Teams include your primary Cardiologist (physician) and Advanced Practice Providers (APPs -  Physician Assistants and Nurse Practitioners) who all work together to provide you with the care you need, when you need it.  We recommend signing up for the patient portal called "MyChart".  Sign up information is provided on this After Visit Summary.  MyChart is used to connect with patients for Virtual Visits (Telemedicine).  Patients are able to view lab/test results, encounter notes, upcoming appointments, etc.  Non-urgent messages can be sent to your provider as well.   To learn more about what you can do with MyChart, go to [email or web address].    Your next appointment:   3 week(s)  The format for your next appointment:   In Person  Provider:   You may see   [name]    Other Instructions

## 2020-07-16 DIAGNOSIS — R338 Other retention of urine: Secondary | ICD-10-CM | POA: Diagnosis not present

## 2020-07-19 DIAGNOSIS — I4891 Unspecified atrial fibrillation: Secondary | ICD-10-CM | POA: Diagnosis not present

## 2020-07-19 DIAGNOSIS — I5022 Chronic systolic (congestive) heart failure: Secondary | ICD-10-CM | POA: Diagnosis not present

## 2020-07-19 DIAGNOSIS — J9602 Acute respiratory failure with hypercapnia: Secondary | ICD-10-CM | POA: Diagnosis not present

## 2020-07-19 DIAGNOSIS — J441 Chronic obstructive pulmonary disease with (acute) exacerbation: Secondary | ICD-10-CM | POA: Diagnosis not present

## 2020-07-19 DIAGNOSIS — I110 Hypertensive heart disease with heart failure: Secondary | ICD-10-CM | POA: Diagnosis not present

## 2020-07-19 DIAGNOSIS — G9341 Metabolic encephalopathy: Secondary | ICD-10-CM | POA: Diagnosis not present

## 2020-07-21 DIAGNOSIS — R338 Other retention of urine: Secondary | ICD-10-CM | POA: Diagnosis not present

## 2020-07-22 ENCOUNTER — Other Ambulatory Visit: Payer: Self-pay

## 2020-07-22 ENCOUNTER — Other Ambulatory Visit: Payer: Medicare Other | Admitting: *Deleted

## 2020-07-22 DIAGNOSIS — G9341 Metabolic encephalopathy: Secondary | ICD-10-CM | POA: Diagnosis not present

## 2020-07-22 DIAGNOSIS — J9602 Acute respiratory failure with hypercapnia: Secondary | ICD-10-CM | POA: Diagnosis not present

## 2020-07-22 DIAGNOSIS — R338 Other retention of urine: Secondary | ICD-10-CM | POA: Diagnosis not present

## 2020-07-22 DIAGNOSIS — I5022 Chronic systolic (congestive) heart failure: Secondary | ICD-10-CM | POA: Diagnosis not present

## 2020-07-22 DIAGNOSIS — N401 Enlarged prostate with lower urinary tract symptoms: Secondary | ICD-10-CM | POA: Diagnosis not present

## 2020-07-22 DIAGNOSIS — I4891 Unspecified atrial fibrillation: Secondary | ICD-10-CM | POA: Diagnosis not present

## 2020-07-22 DIAGNOSIS — J441 Chronic obstructive pulmonary disease with (acute) exacerbation: Secondary | ICD-10-CM | POA: Diagnosis not present

## 2020-07-22 DIAGNOSIS — I5042 Chronic combined systolic (congestive) and diastolic (congestive) heart failure: Secondary | ICD-10-CM

## 2020-07-22 DIAGNOSIS — I110 Hypertensive heart disease with heart failure: Secondary | ICD-10-CM | POA: Diagnosis not present

## 2020-07-22 LAB — BASIC METABOLIC PANEL
BUN/Creatinine Ratio: 17 (ref 10–24)
BUN: 22 mg/dL (ref 8–27)
CO2: 28 mmol/L (ref 20–29)
Calcium: 9.9 mg/dL (ref 8.6–10.2)
Chloride: 96 mmol/L (ref 96–106)
Creatinine, Ser: 1.29 mg/dL — ABNORMAL HIGH (ref 0.76–1.27)
GFR calc Af Amer: 61 mL/min/{1.73_m2} (ref >59)
GFR calc non Af Amer: 53 mL/min/{1.73_m2} — ABNORMAL LOW (ref >59)
Glucose: 65 mg/dL (ref 65–99)
Potassium: 4.4 mmol/L (ref 3.5–5.2)
Sodium: 138 mmol/L (ref 134–144)

## 2020-07-23 DIAGNOSIS — G9341 Metabolic encephalopathy: Secondary | ICD-10-CM | POA: Diagnosis not present

## 2020-07-23 DIAGNOSIS — I4891 Unspecified atrial fibrillation: Secondary | ICD-10-CM | POA: Diagnosis not present

## 2020-07-23 DIAGNOSIS — I110 Hypertensive heart disease with heart failure: Secondary | ICD-10-CM | POA: Diagnosis not present

## 2020-07-23 DIAGNOSIS — J9602 Acute respiratory failure with hypercapnia: Secondary | ICD-10-CM | POA: Diagnosis not present

## 2020-07-23 DIAGNOSIS — I5022 Chronic systolic (congestive) heart failure: Secondary | ICD-10-CM | POA: Diagnosis not present

## 2020-07-23 DIAGNOSIS — J441 Chronic obstructive pulmonary disease with (acute) exacerbation: Secondary | ICD-10-CM | POA: Diagnosis not present

## 2020-07-26 DIAGNOSIS — J9602 Acute respiratory failure with hypercapnia: Secondary | ICD-10-CM | POA: Diagnosis not present

## 2020-07-26 DIAGNOSIS — G9341 Metabolic encephalopathy: Secondary | ICD-10-CM | POA: Diagnosis not present

## 2020-07-26 DIAGNOSIS — J441 Chronic obstructive pulmonary disease with (acute) exacerbation: Secondary | ICD-10-CM | POA: Diagnosis not present

## 2020-07-26 DIAGNOSIS — I110 Hypertensive heart disease with heart failure: Secondary | ICD-10-CM | POA: Diagnosis not present

## 2020-07-26 DIAGNOSIS — I4891 Unspecified atrial fibrillation: Secondary | ICD-10-CM | POA: Diagnosis not present

## 2020-07-26 DIAGNOSIS — I5022 Chronic systolic (congestive) heart failure: Secondary | ICD-10-CM | POA: Diagnosis not present

## 2020-07-27 DIAGNOSIS — Z23 Encounter for immunization: Secondary | ICD-10-CM | POA: Diagnosis not present

## 2020-07-29 ENCOUNTER — Telehealth: Payer: Self-pay

## 2020-07-29 DIAGNOSIS — I5022 Chronic systolic (congestive) heart failure: Secondary | ICD-10-CM | POA: Diagnosis not present

## 2020-07-29 DIAGNOSIS — J441 Chronic obstructive pulmonary disease with (acute) exacerbation: Secondary | ICD-10-CM | POA: Diagnosis not present

## 2020-07-29 DIAGNOSIS — J9602 Acute respiratory failure with hypercapnia: Secondary | ICD-10-CM | POA: Diagnosis not present

## 2020-07-29 DIAGNOSIS — I110 Hypertensive heart disease with heart failure: Secondary | ICD-10-CM | POA: Diagnosis not present

## 2020-07-29 DIAGNOSIS — G9341 Metabolic encephalopathy: Secondary | ICD-10-CM | POA: Diagnosis not present

## 2020-07-29 DIAGNOSIS — I4891 Unspecified atrial fibrillation: Secondary | ICD-10-CM | POA: Diagnosis not present

## 2020-07-29 MED ORDER — AMIODARONE HCL 200 MG PO TABS: 200.0000 mg | ORAL_TABLET | Freq: Every day | ORAL | 0 refills | Status: DC

## 2020-07-29 NOTE — Telephone Encounter (Signed)
Pt's Brother (caregiver) called asking for clarification on his Amiodarone Rx... He stated that according to the directions on the bottle he is to take 400mg  daily for 14 days then 200mg  daily for 14 days. He wants to know if he is supposed to d/c the medication after the 200mg  for 14 days.  Please call the caregiver to clarify this.

## 2020-07-29 NOTE — Telephone Encounter (Signed)
Spoke to pt's brother, dpr on file. Advised to that pt should be taking Amiodarone 200 mg once daily.  Informed that we will further discuss length of having to take this medication --- pt has hx COPD per brother. Sent in for 30 day supply until can further discuss this w/ MD next week. Brother is agreeable to plan and appreciates the follow up  Will forward to Dr. Gardenia Phlegm, PA in case there is different advisement

## 2020-07-30 DIAGNOSIS — I4891 Unspecified atrial fibrillation: Secondary | ICD-10-CM | POA: Diagnosis not present

## 2020-07-30 DIAGNOSIS — J9602 Acute respiratory failure with hypercapnia: Secondary | ICD-10-CM | POA: Diagnosis not present

## 2020-07-30 DIAGNOSIS — I110 Hypertensive heart disease with heart failure: Secondary | ICD-10-CM | POA: Diagnosis not present

## 2020-07-30 DIAGNOSIS — I5022 Chronic systolic (congestive) heart failure: Secondary | ICD-10-CM | POA: Diagnosis not present

## 2020-07-30 DIAGNOSIS — G9341 Metabolic encephalopathy: Secondary | ICD-10-CM | POA: Diagnosis not present

## 2020-07-30 DIAGNOSIS — J441 Chronic obstructive pulmonary disease with (acute) exacerbation: Secondary | ICD-10-CM | POA: Diagnosis not present

## 2020-08-02 DIAGNOSIS — J9602 Acute respiratory failure with hypercapnia: Secondary | ICD-10-CM | POA: Diagnosis not present

## 2020-08-02 DIAGNOSIS — I4891 Unspecified atrial fibrillation: Secondary | ICD-10-CM | POA: Diagnosis not present

## 2020-08-02 DIAGNOSIS — I5022 Chronic systolic (congestive) heart failure: Secondary | ICD-10-CM | POA: Diagnosis not present

## 2020-08-02 DIAGNOSIS — G9341 Metabolic encephalopathy: Secondary | ICD-10-CM | POA: Diagnosis not present

## 2020-08-02 DIAGNOSIS — I110 Hypertensive heart disease with heart failure: Secondary | ICD-10-CM | POA: Diagnosis not present

## 2020-08-02 DIAGNOSIS — J441 Chronic obstructive pulmonary disease with (acute) exacerbation: Secondary | ICD-10-CM | POA: Diagnosis not present

## 2020-08-03 DIAGNOSIS — I5022 Chronic systolic (congestive) heart failure: Secondary | ICD-10-CM | POA: Diagnosis not present

## 2020-08-03 DIAGNOSIS — J9602 Acute respiratory failure with hypercapnia: Secondary | ICD-10-CM | POA: Diagnosis not present

## 2020-08-03 DIAGNOSIS — I110 Hypertensive heart disease with heart failure: Secondary | ICD-10-CM | POA: Diagnosis not present

## 2020-08-03 DIAGNOSIS — G9341 Metabolic encephalopathy: Secondary | ICD-10-CM | POA: Diagnosis not present

## 2020-08-03 DIAGNOSIS — I4891 Unspecified atrial fibrillation: Secondary | ICD-10-CM | POA: Diagnosis not present

## 2020-08-03 DIAGNOSIS — J441 Chronic obstructive pulmonary disease with (acute) exacerbation: Secondary | ICD-10-CM | POA: Diagnosis not present

## 2020-08-05 ENCOUNTER — Encounter: Payer: Medicare Other | Admitting: Cardiology

## 2020-08-10 ENCOUNTER — Ambulatory Visit (INDEPENDENT_AMBULATORY_CARE_PROVIDER_SITE_OTHER): Payer: Medicare Other

## 2020-08-10 DIAGNOSIS — N401 Enlarged prostate with lower urinary tract symptoms: Secondary | ICD-10-CM | POA: Diagnosis not present

## 2020-08-10 DIAGNOSIS — I472 Ventricular tachycardia, unspecified: Secondary | ICD-10-CM

## 2020-08-10 DIAGNOSIS — R338 Other retention of urine: Secondary | ICD-10-CM | POA: Diagnosis not present

## 2020-08-10 DIAGNOSIS — I422 Other hypertrophic cardiomyopathy: Secondary | ICD-10-CM

## 2020-08-10 LAB — CUP PACEART REMOTE DEVICE CHECK
Battery Remaining Longevity: 16 mo
Battery Voltage: 2.9 V
Brady Statistic AP VP Percent: 98.76 %
Brady Statistic AP VS Percent: 0.05 %
Brady Statistic AS VP Percent: 1.19 %
Brady Statistic AS VS Percent: 0 %
Brady Statistic RA Percent Paced: 98.46 %
Brady Statistic RV Percent Paced: 99.9 %
Date Time Interrogation Session: 20211019012404
HighPow Impedance: 78 Ohm
Implantable Lead Implant Date: 20070328
Implantable Lead Implant Date: 20150323
Implantable Lead Location: 753859
Implantable Lead Location: 753860
Implantable Lead Model: 5076
Implantable Pulse Generator Implant Date: 20150323
Lead Channel Impedance Value: 342 Ohm
Lead Channel Impedance Value: 399 Ohm
Lead Channel Impedance Value: 513 Ohm
Lead Channel Pacing Threshold Amplitude: 0.625 V
Lead Channel Pacing Threshold Amplitude: 0.75 V
Lead Channel Pacing Threshold Pulse Width: 0.4 ms
Lead Channel Pacing Threshold Pulse Width: 0.4 ms
Lead Channel Sensing Intrinsic Amplitude: 1.5 mV
Lead Channel Sensing Intrinsic Amplitude: 1.5 mV
Lead Channel Sensing Intrinsic Amplitude: 5.25 mV
Lead Channel Sensing Intrinsic Amplitude: 7.875 mV
Lead Channel Setting Pacing Amplitude: 2 V
Lead Channel Setting Pacing Amplitude: 2.5 V
Lead Channel Setting Pacing Pulse Width: 0.4 ms
Lead Channel Setting Sensing Sensitivity: 0.3 mV

## 2020-08-24 ENCOUNTER — Other Ambulatory Visit: Payer: Self-pay

## 2020-08-24 ENCOUNTER — Ambulatory Visit (INDEPENDENT_AMBULATORY_CARE_PROVIDER_SITE_OTHER): Payer: Medicare Other | Admitting: Podiatry

## 2020-08-24 ENCOUNTER — Encounter: Payer: Self-pay | Admitting: Podiatry

## 2020-08-24 DIAGNOSIS — M79674 Pain in right toe(s): Secondary | ICD-10-CM | POA: Diagnosis not present

## 2020-08-24 DIAGNOSIS — M79675 Pain in left toe(s): Secondary | ICD-10-CM

## 2020-08-24 DIAGNOSIS — B351 Tinea unguium: Secondary | ICD-10-CM

## 2020-08-26 ENCOUNTER — Ambulatory Visit (INDEPENDENT_AMBULATORY_CARE_PROVIDER_SITE_OTHER): Payer: Medicare Other | Admitting: Cardiology

## 2020-08-26 ENCOUNTER — Other Ambulatory Visit: Payer: Self-pay

## 2020-08-26 ENCOUNTER — Encounter: Payer: Self-pay | Admitting: Cardiology

## 2020-08-26 VITALS — BP 132/82 | HR 84 | Ht 68.0 in | Wt 174.8 lb

## 2020-08-26 DIAGNOSIS — I4819 Other persistent atrial fibrillation: Secondary | ICD-10-CM

## 2020-08-26 MED ORDER — AMIODARONE HCL 200 MG PO TABS: 100.0000 mg | ORAL_TABLET | Freq: Every day | ORAL | 1 refills | Status: DC

## 2020-08-26 MED ORDER — FUROSEMIDE 40 MG PO TABS: ORAL_TABLET | ORAL | 0 refills | Status: DC

## 2020-08-26 NOTE — Progress Notes (Signed)
Electrophysiology Office Note   Date:  08/27/2020   ID:  Baine Decesare, DOB 28-Dec-1940, MRN [id number]  PCP:  [name], MD  Cardiologist:   Primary Electrophysiologist:  Khalaya Mcgurn [name], MD    No chief complaint on file.    History of Present Illness: Taesean Reth is a 79 y.o. male who is being seen today for the evaluation of ICD/hypertrophic cardiomyopathy at the request of [name], [name], MD. Presenting today for electrophysiology evaluation.    He has a history of atrial fibrillation, CHF, COPD, GI bleed, and hypertension.  He also has an ICD implanted for nonsustained VT with hypertrophic cardiomyopathy.  He has a history of complete heart block in his past as well.  September 2020, he was hospitalized with heart failure, atrial fibrillation, and progressive weakness and fatigue.  On arrival in the emergency room, he was hypoxic down to 83% on room air requiring BiPAP.  He was Covid negative.  His BNP was elevated.  He was in atrial fibrillation and was loaded on amiodarone and had a cardioversion.  Today, denies symptoms of palpitations, chest pain, shortness of breath, orthopnea, PND, lower extremity edema, claudication, dizziness, presyncope, syncope, bleeding, or neurologic sequela. The patient is tolerating medications without difficulties.  Today he feels well.  He has no chest pain or shortness of breath.  He is able to do all his daily activities.  Since he left the hospital, his shortness of breath has remained constant, though improved from his admission.  His oxygen saturations have also improved.  He does not complain of lower extremity edema.   Past Medical History:  Diagnosis Date  . Angiodysplasia   . Atrial fibrillation ([place])   . CHF (congestive heart failure) ([place])   . COPD (chronic obstructive pulmonary disease) ([place])   . GI bleed   . Hypertension    Past Surgical History:  Procedure Laterality Date  . COLONOSCOPY    . PACEMAKER IMPLANT        Current Outpatient Medications  Medication Sig Dispense Refill  . apixaban (ELIQUIS) 5 MG TABS tablet Take 1 tablet (5 mg total) by mouth 2 (two) times daily. 60 tablet 0  . atorvastatin (LIPITOR) 20 MG tablet Take 20 mg by mouth daily.    . divalproex (DEPAKOTE ER) 500 MG 24 hr tablet Take 1,000 mg by mouth daily.     . finasteride (PROSCAR) 5 MG tablet Take 5 mg by mouth daily.    [name] Kitchen losartan-hydrochlorothiazide (HYZAAR) 100-12.5 MG tablet Take 1 tablet by mouth daily.    [name] Kitchen losartan-hydrochlorothiazide (HYZAAR) 100-25 MG tablet Take 1 tablet by mouth daily. 90 tablet 1  . oxybutynin (DITROPAN-XL) 10 MG 24 hr tablet Take 10 mg by mouth daily.    . tamsulosin (FLOMAX) 0.4 MG CAPS capsule Take 1 capsule (0.4 mg total) by mouth daily after supper. 30 capsule 0  . amiodarone (PACERONE) 200 MG tablet Take 0.5 tablets (100 mg total) by mouth daily. 45 tablet 1  . furosemide (LASIX) 40 MG tablet Take 1 tablet (40 mg total) once daily for 3 days. 10 tablet 0   No current facility-administered medications for this visit.    Allergies:   Patient has no known allergies.   Social History:  The patient  reports that he quit smoking about 8 years ago. His smoking use included cigarettes. He has never used smokeless tobacco. He reports previous alcohol use. He reports that he does not use drugs.   Family History:  The patient's  family history includes Breast cancer (age of onset: 38) in his father; Cancer in an other family member; Healthy in his mother.    ROS:  Please see the history of present illness.   Otherwise, review of systems is positive for none.   All other systems are reviewed and negative.   PHYSICAL EXAM: VS:  BP 132/82   Pulse 84   Ht 5\' 8"  (1.727 m)   Wt 174 lb 12.8 oz (79.3 kg)   SpO2 94%   BMI 26.58 kg/m  , BMI Body mass index is 26.58 kg/m. GEN: Well nourished, well developed, in no acute distress  HEENT: normal  Neck: no JVD, carotid bruits, or masses Cardiac: RRR;  no murmurs, rubs, or gallops,no edema  Respiratory:  clear to auscultation bilaterally, normal work of breathing GI: soft, nontender, nondistended, + BS MS: no deformity or atrophy  Skin: warm and dry, device site well healed Neuro:  Strength and sensation are intact Psych: euthymic mood, full affect  EKG:  EKG is ordered today. Personal review of the ekg ordered shows AV paced  Personal review of the device interrogation today. Results in [place]: 07/04/2020: B Natriuretic Peptide 794.8 07/06/2020: ALT 35; TSH 0.315 07/07/2020: Hemoglobin 15.8; Platelets 231 07/09/2020: Magnesium 2.1 07/22/2020: BUN 22; Creatinine, Ser 1.29; Potassium 4.4; Sodium 138    Lipid Panel  No results found for: CHOL, TRIG, HDL, CHOLHDL, VLDL, LDLCALC, LDLDIRECT   Wt Readings from Last 3 Encounters:  08/26/20 174 lb 12.8 oz (79.3 kg)  07/15/20 171 lb (77.6 kg)  07/10/20 175 lb (79.4 kg)      Other studies Reviewed: Additional studies/ records that were reviewed today include: TTE 07/05/2020 1. Left ventricular ejection fraction, by estimation, is 40 to 45%. The  left ventricle has mildly decreased function. The left ventricle  demonstrates global hypokinesis. There is moderate asymmetric left  ventricular hypertrophy of the septal segment.  Left ventricular diastolic function could not be evaluated.  2. Right ventricular systolic function is normal. The right ventricular  size is normal. A n AICD wire is visualized.  3. The mitral valve is abnormal. Trivial mitral valve regurgitation.  4. The aortic valve is tricuspid. Aortic valve regurgitation is not  visualized. Mild to moderate aortic valve sclerosis/calcification is  present, without any evidence of aortic stenosis.  5. Aortic dilatation noted. There is mild dilatation of the ascending  aorta, measuring 38 mm.  6. The inferior vena cava is dilated in size with >50% respiratory  variability, suggesting right atrial pressure  of 8 mmHg.    ASSESSMENT AND PLAN:  1.  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  Device functioning appropriately.  Currently on Toprol-XL.  He has evidence of volume overload on his device.  We Akeria Hedstrom give him Lasix 40 mg for the next 3 days.  2.  Persistent atrial fibrillation currently on Eliquis with a CHA2DS2-VASc of 3.  He was loaded on amiodarone while in the hospital (monitoring for high risk medication).  Minimal symptoms.  No changes.  He was loaded on amiodarone and cardioverted while in the hospital.  We Meagen Limones continue amiodarone.  3.  Hypertension: Elevated today but usually well controlled at home.  No changes.  4.  Nonsustained ventricular tachycardia: Seen on device interrogation.  No changes.  5.  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  Device functioning appropriately.  No changes.  Current medicines are reviewed at length with the patient today.  The patient does not have concerns regarding his medicines.  The following changes were made today: None  Labs/ tests ordered today include:  Orders Placed This Encounter  Procedures  . EKG 12-Lead    Disposition:   FU with Ereka Brau 6 months  Signed, Colin Ellers [name], MD  08/27/2020 7:14 AM     [facility] [address] [address] [phone] (office) [phone] (fax)

## 2020-08-26 NOTE — Progress Notes (Signed)
Remote ICD transmission.   

## 2020-08-26 NOTE — Patient Instructions (Addendum)
Medication Instructions:  Your physician has recommended you make the following change in your medication:  1. TAKE Amiodarone 100 mg once daily 2. TAKE Lasix 40 mg once daily for 3 days  *If you need a refill on your cardiac medications before your next appointment, please call your pharmacy*   Lab Work: None ordered   Testing/Procedures: None ordered   Follow-Up: At [facility], you and your health needs are our priority.  As part of our continuing mission to provide you with exceptional heart care, we have created designated Provider Care Teams.  These Care Teams include your primary Cardiologist (physician) and Advanced Practice Providers (APPs -  Physician Assistants and Nurse Practitioners) who all work together to provide you with the care you need, when you need it.   Your next appointment:   6 month(s)  The format for your next appointment:   In Person  Provider:   Allegra Lai, MD    Thank you for choosing [place]!!   Trinidad Curet, RN [phone]   Other Instructions

## 2020-08-28 NOTE — Progress Notes (Signed)
Subjective:  Patient ID: Mike Gray, male    DOB: Feb 03, 1941,  MRN: [id number]  Mike Gray presents to clinic today for painful thick toenails that are difficult to trim. Pain interferes with ambulation. Aggravating factors include wearing enclosed shoe gear. Pain is relieved with periodic professional debridement.[name] Kitchen  He voices no new pedal problems on today's visit.  Review of Systems: Negative except as noted in the HPI. Past Medical History:  Diagnosis Date   Angiodysplasia    Atrial fibrillation (HCC)    CHF (congestive heart failure) (HCC)    COPD (chronic obstructive pulmonary disease) (HCC)    GI bleed    Hypertension    Past Surgical History:  Procedure Laterality Date   COLONOSCOPY     PACEMAKER IMPLANT      Current Outpatient Medications:    apixaban (ELIQUIS) 5 MG TABS tablet, Take 1 tablet (5 mg total) by mouth 2 (two) times daily., Disp: 60 tablet, Rfl: 0   atorvastatin (LIPITOR) 20 MG tablet, Take 20 mg by mouth daily., Disp: , Rfl:    divalproex (DEPAKOTE ER) 500 MG 24 hr tablet, Take 1,000 mg by mouth daily. , Disp: , Rfl:    finasteride (PROSCAR) 5 MG tablet, Take 5 mg by mouth daily., Disp: , Rfl:    losartan-hydrochlorothiazide (HYZAAR) 100-12.5 MG tablet, Take 1 tablet by mouth daily., Disp: , Rfl:    losartan-hydrochlorothiazide (HYZAAR) 100-25 MG tablet, Take 1 tablet by mouth daily., Disp: 90 tablet, Rfl: 1   oxybutynin (DITROPAN-XL) 10 MG 24 hr tablet, Take 10 mg by mouth daily., Disp: , Rfl:    tamsulosin (FLOMAX) 0.4 MG CAPS capsule, Take 1 capsule (0.4 mg total) by mouth daily after supper., Disp: 30 capsule, Rfl: 0   amiodarone (PACERONE) 200 MG tablet, Take 0.5 tablets (100 mg total) by mouth daily., Disp: 45 tablet, Rfl: 1   furosemide (LASIX) 40 MG tablet, Take 1 tablet (40 mg total) once daily for 3 days., Disp: 10 tablet, Rfl: 0 No Known Allergies Social History   Occupational History   Not on file  Tobacco Use    Smoking status: Former Smoker    Types: Cigarettes    Quit date: 2013    Years since quitting: 8.8   Smokeless tobacco: Never Used  Scientific laboratory technician Use: Never used  Substance and Sexual Activity   Alcohol use: Not Currently   Drug use: Never   Sexual activity: Not on file    Objective:   Constitutional Mike Gray is a pleasant 79 y.o. Caucasian male, WD, WN in NAD.[name] Kitchen AAO x 3.   Vascular Dorsalis pedis pulses palpable bilaterally. Posterior tibial pulses palpable bilaterally.Capillary refill normal to all digits.No cyanosis or clubbing noted Pedal hair growth sparse b/l. Lower extremity skin temperature gradient within normal limits.  Neurologic Normal speech. Oriented to person, place, and time.Epicritic sensation to light touch grossly present bilaterally. Protective sensation intact 5/5 intact bilaterally with 10g monofilament b/l. Vibratory sensation intact b/l. Proprioception intact bilaterally.  Dermatologic Pedal skin with normal turgor, texture and tone bilaterally. No open wounds bilaterally. No interdigital macerations bilaterally. Toenails 1-5 b/l elongated, discolored, dystrophic, thickened, crumbly with subungual debris and tenderness to dorsal palpation.  Orthopedic: Normal muscle strength 5/5 to all lower extremity muscle groups bilaterally. No pain crepitus or joint limitation noted with ROM b/l. No gross bony deformities bilaterally. Patient ambulates independent of any assistive aids. No bony tenderness.    Radiographs: None Assessment:   1. Pain due to onychomycosis of  toenails of both feet    Plan:  Patient was evaluated and treated and all questions answered.  Onychomycosis with pain -Nails palliatively debridement as below -Educated on self-care  Procedure: Nail Debridement Rationale: Pain Type of Debridement: manual, sharp debridement. Instrumentation: Nail nipper, rotary burr. Number of Nails: 10 -Examined patient. -Toenails 1-5 b/l were  debrided in length and girth with sterile nail nippers and dremel. Offending nail borders debrided and curretaged b/l great toes. Borders cleansed with alcohol. Antibiotic ointment applied. No further treatment required by patient. -Iatrogenic laceration sustained during R 2nd toe.  Treated with Lumicain Hemostatic Solution and alcohol. Patient instructed to apply Neosporin to R 2nd toe once daily for 7 days. Call if he has any problems. -Patient to report any pedal injuries to medical professional immediately. -Patient to continue soft, supportive shoe gear daily. -Patient/POA to call should there be question/concern in the interim.  Return in about 3 months (around 11/24/2020).  Mike Gray, DPM

## 2020-09-08 ENCOUNTER — Telehealth: Payer: Self-pay | Admitting: Cardiology

## 2020-09-08 MED ORDER — APIXABAN 5 MG PO TABS: 5.0000 mg | ORAL_TABLET | Freq: Two times a day (BID) | ORAL | 5 refills | Status: DC

## 2020-09-08 NOTE — Telephone Encounter (Signed)
Pt last saw Dr Curt Bears 07/26/20, last labs 07/22/20 Creat 1.29, age 79, weight 79.3, based on specified criteria pt is on appropriate dosage of Eliquis 5mg  BID.  Will refill rx.

## 2020-09-08 NOTE — Telephone Encounter (Signed)
Pt c/o medication issue:  1. Name of Medication: apixaban (ELIQUIS) 5 MG TABS tablet  2. How are you currently taking this medication (dosage and times per day)? As written  3. Are you having a reaction (difficulty breathing--STAT)? No   4. What is your medication issue? Patient is out of medication. Needs new prescription filled asap

## 2020-10-05 DIAGNOSIS — D1801 Hemangioma of skin and subcutaneous tissue: Secondary | ICD-10-CM | POA: Diagnosis not present

## 2020-10-05 DIAGNOSIS — L821 Other seborrheic keratosis: Secondary | ICD-10-CM | POA: Diagnosis not present

## 2020-10-05 DIAGNOSIS — L720 Epidermal cyst: Secondary | ICD-10-CM | POA: Diagnosis not present

## 2020-10-05 DIAGNOSIS — D225 Melanocytic nevi of trunk: Secondary | ICD-10-CM | POA: Diagnosis not present

## 2020-10-05 DIAGNOSIS — L918 Other hypertrophic disorders of the skin: Secondary | ICD-10-CM | POA: Diagnosis not present

## 2020-10-05 DIAGNOSIS — L570 Actinic keratosis: Secondary | ICD-10-CM | POA: Diagnosis not present

## 2020-11-09 ENCOUNTER — Telehealth: Payer: Self-pay | Admitting: Cardiology

## 2020-11-09 ENCOUNTER — Ambulatory Visit (INDEPENDENT_AMBULATORY_CARE_PROVIDER_SITE_OTHER): Payer: Medicare Other

## 2020-11-09 DIAGNOSIS — I4819 Other persistent atrial fibrillation: Secondary | ICD-10-CM | POA: Diagnosis not present

## 2020-11-09 LAB — CUP PACEART REMOTE DEVICE CHECK
Battery Remaining Longevity: 16 mo
Battery Voltage: 2.9 V
Brady Statistic AP VP Percent: 98.48 %
Brady Statistic AP VS Percent: 0.14 %
Brady Statistic AS VP Percent: 1.37 %
Brady Statistic AS VS Percent: 0 %
Brady Statistic RA Percent Paced: 98.41 %
Brady Statistic RV Percent Paced: 99.72 %
Date Time Interrogation Session: 20220118012204
HighPow Impedance: 67 Ohm
Implantable Lead Implant Date: 20070328
Implantable Lead Implant Date: 20150323
Implantable Lead Location: 753859
Implantable Lead Location: 753860
Implantable Lead Model: 5076
Implantable Pulse Generator Implant Date: 20150323
Lead Channel Impedance Value: 304 Ohm
Lead Channel Impedance Value: 361 Ohm
Lead Channel Impedance Value: 456 Ohm
Lead Channel Pacing Threshold Amplitude: 0.625 V
Lead Channel Pacing Threshold Amplitude: 0.625 V
Lead Channel Pacing Threshold Pulse Width: 0.4 ms
Lead Channel Pacing Threshold Pulse Width: 0.4 ms
Lead Channel Sensing Intrinsic Amplitude: 0.875 mV
Lead Channel Sensing Intrinsic Amplitude: 0.875 mV
Lead Channel Sensing Intrinsic Amplitude: 5.25 mV
Lead Channel Sensing Intrinsic Amplitude: 7.875 mV
Lead Channel Setting Pacing Amplitude: 2 V
Lead Channel Setting Pacing Amplitude: 2.5 V
Lead Channel Setting Pacing Pulse Width: 0.4 ms
Lead Channel Setting Sensing Sensitivity: 0.3 mV

## 2020-11-09 NOTE — Telephone Encounter (Signed)
Remote transmission received 11/09/20. Normal device function. 2 NSVT events logged, < 20 beats. Appear Optivol is elevated and retaining fluid. Patient called and reports he is overall doing well. Reports he does have swelling noted in ankles. Denies any other fluid retention issues. Reports compliance with Amiodarone 100 mg daily, Eliquis 5 mg BID, Hyzarr 100-12.5 mg daily. Advised I will forward to [name] and we will call with changes.

## 2020-11-09 NOTE — Telephone Encounter (Signed)
Patient calling to see if his transmission went through.

## 2020-11-09 NOTE — Telephone Encounter (Signed)
Remote transmission received.

## 2020-11-10 IMAGING — CT CT HEAD W/O CM
3 of 4 series · 14 of 47 positions shown, 16 images · non-contrast
Comparison: None.

CLINICAL DATA: Altered mental status with increased fatigue and
shortness of breath.

EXAM:
CT HEAD WITHOUT CONTRAST
TECHNIQUE: Contiguous axial images were obtained from the base of the skull
through the vertex without intravenous contrast.

[Series 6: coronal soft tissue · coronal · 0.31mm/px · 3 of 78 slices shown]
[im 26/78  brain]
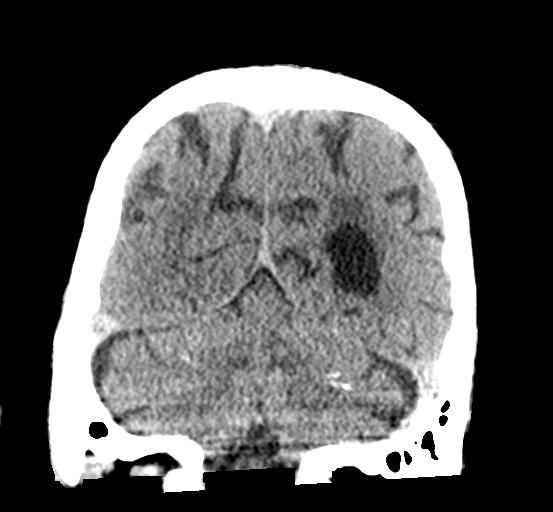
[im 35/78  brain]
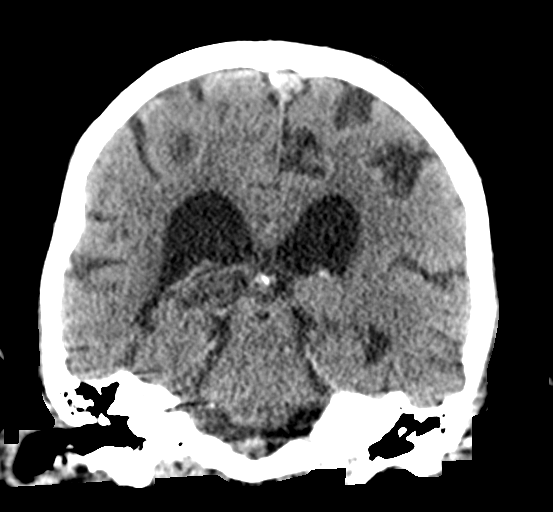
[im 43/78  brain]
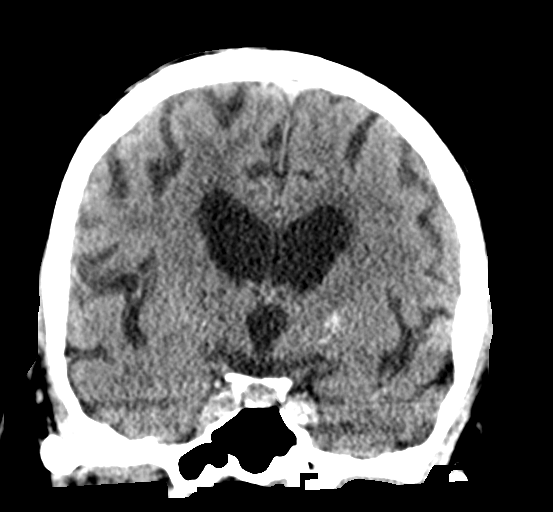

[Series 7: sagittal soft tissue · sagittal · 0.33mm/px · 3 of 58 slices shown]
[im 20/58  brain]
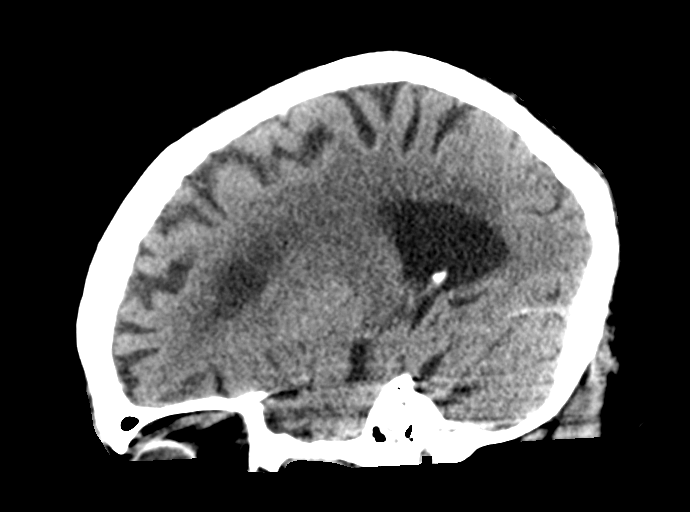
[im 29/58  brain]
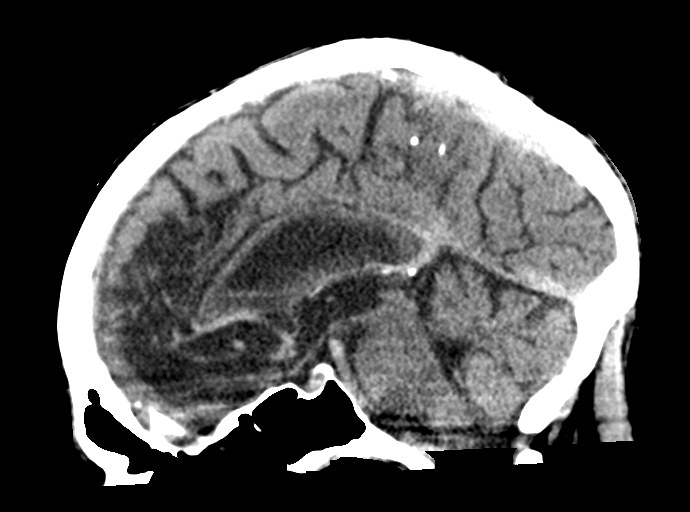
[im 39/58  brain]
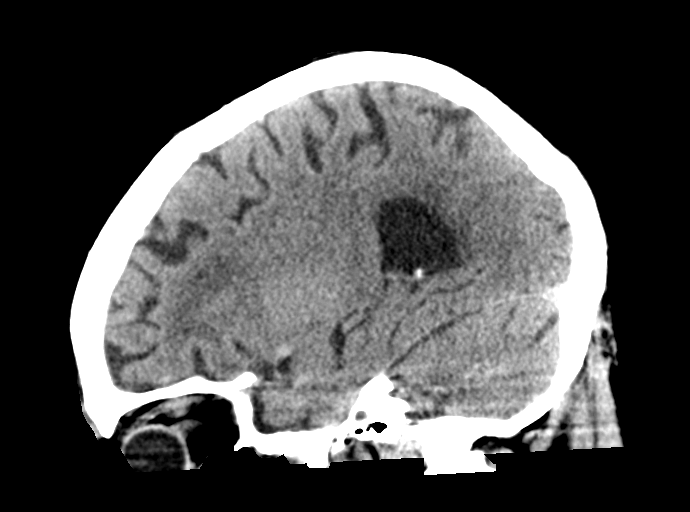

[Series 8: true axial · axial · 0.34mm/px · z∈[-134,-9]mm · 8 of 56 slices shown, 10 images]
[im 7/56  brain]
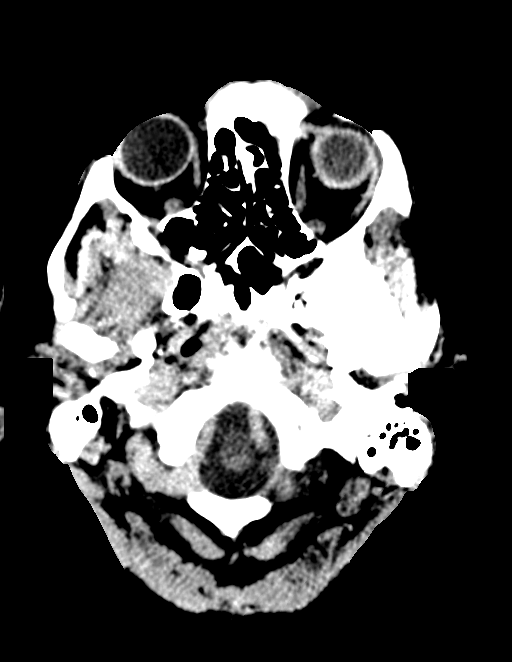
[im 7/56  bone]
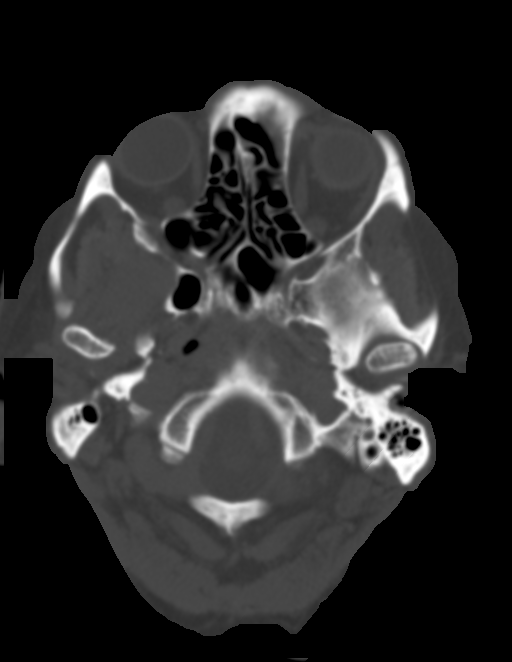
[im 13/56  brain]
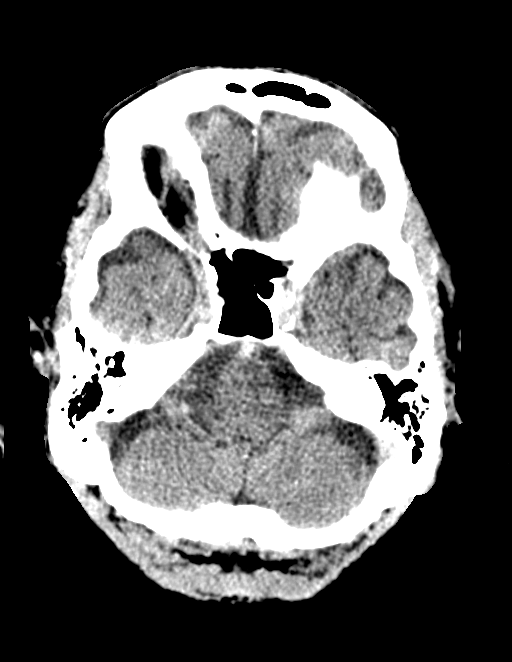
[im 19/56  brain]
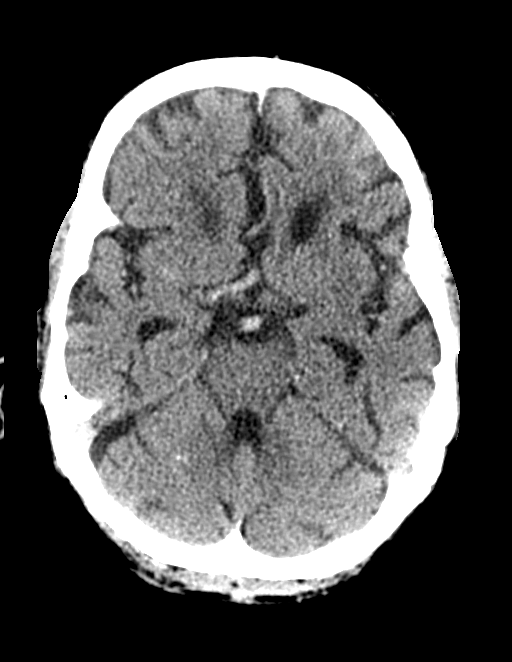
[im 25/56  brain]
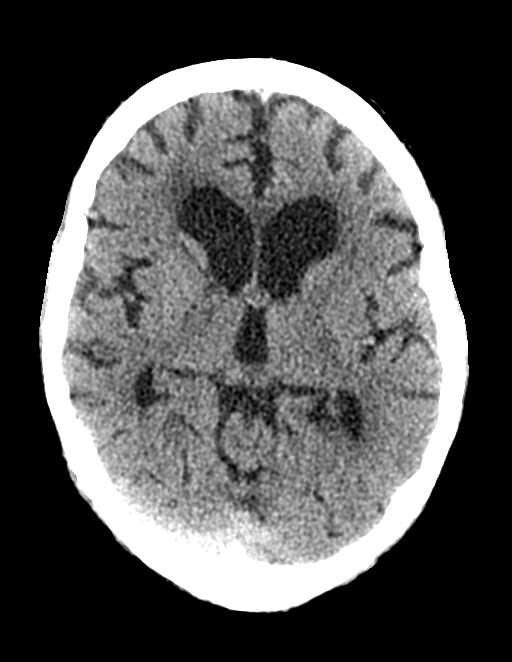
[im 31/56  brain]
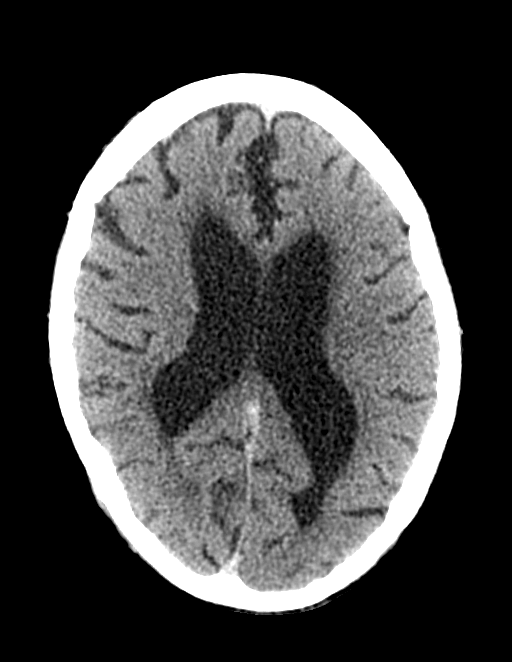
[im 31/56  bone]
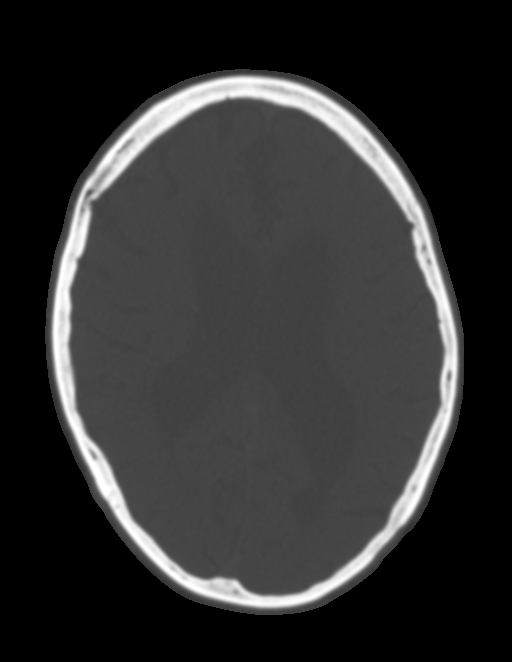
[im 37/56  brain]
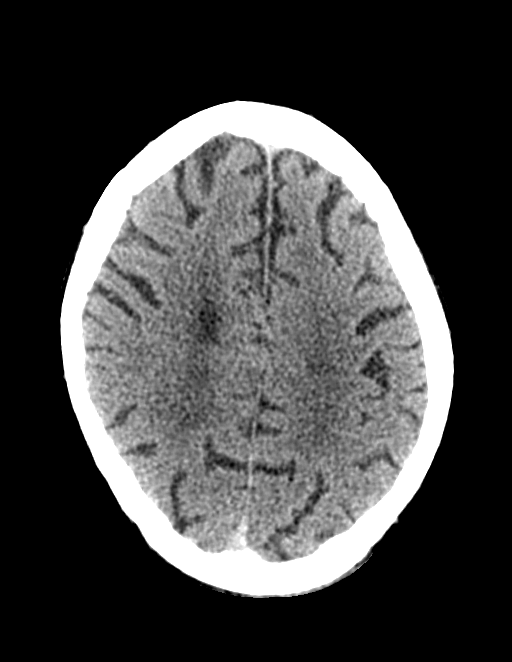
[im 43/56  brain]
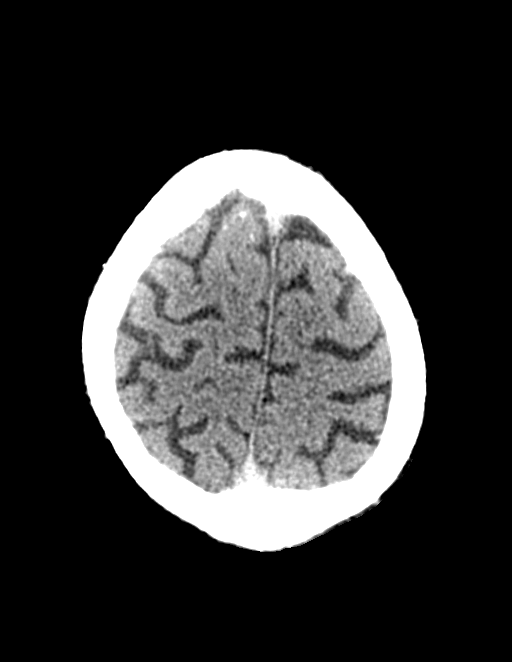
[im 49/56  brain]
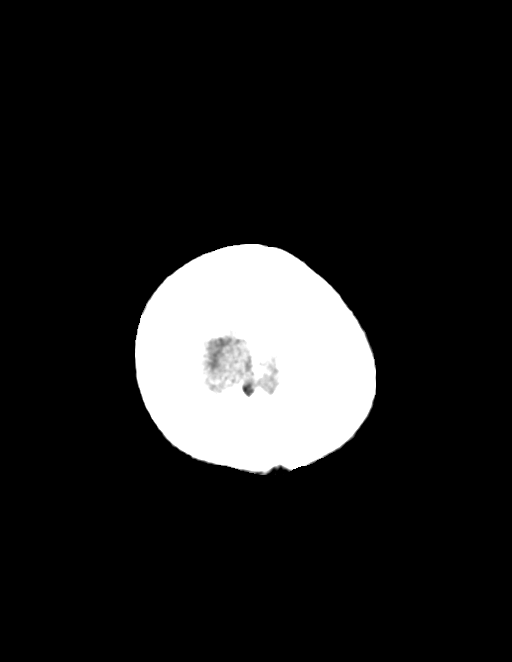

[14 of 47 positions shown; findings below may reference images not displayed]

FINDINGS: Brain: There is mild to moderate severity cerebral atrophy with
widening of the extra-axial spaces and ventricular dilatation.
There are areas of decreased attenuation within the white matter
tracts of the supratentorial brain, consistent with microvascular
disease changes.

Vascular: No hyperdense vessel or unexpected calcification.

Skull: Normal. Negative for fracture or focal lesion.

Sinuses/Orbits: No acute finding.

Other: None.
IMPRESSION: 1. Generalized cerebral atrophy.
2. No acute intracranial abnormality.

## 2020-11-10 NOTE — Telephone Encounter (Signed)
For volume overload, take 40 mg lasix daily for 3 days.

## 2020-11-10 NOTE — Telephone Encounter (Signed)
Spoke with pt brother [name], advised of [name] recommendation to take Lasix 40mg  X 3 days, rx was sent to [facility] this AM.  Advised if condition worsens to notify MD.

## 2020-11-11 ENCOUNTER — Other Ambulatory Visit: Payer: Self-pay | Admitting: Emergency Medicine

## 2020-11-11 ENCOUNTER — Telehealth: Payer: Self-pay | Admitting: Cardiology

## 2020-11-11 ENCOUNTER — Telehealth: Payer: Self-pay | Admitting: Emergency Medicine

## 2020-11-11 NOTE — Telephone Encounter (Signed)
Follow Up:    Pt said he was returning Amy's call, concerning his medicine.

## 2020-11-11 NOTE — Telephone Encounter (Signed)
[facility] on [address]. contacted [phone] and advised that they did not receive the medication prescription for Lasix 40 mg once daily x 3 days for the patient. Pharmacist took a verbal order from RN over the phone for prescription. Attempted to contact patient by home phone with no answer. Attempted to contact patient by cell phone, detailed message left on voicemail advising patient that prescription was now available at the [facility]. [facility].

## 2020-11-11 NOTE — Telephone Encounter (Signed)
Pt's medication was not sent to pt's pharmacy. Pt calling inquiring about medication. Please address

## 2020-11-11 NOTE — Telephone Encounter (Signed)
° ° °  Pt is calling back, meds has not called in yet

## 2020-11-11 NOTE — Telephone Encounter (Signed)
° ° °  Pt is calling back to follow up medications, he said to send it to his [place], [place] - [address]

## 2020-11-11 NOTE — Telephone Encounter (Signed)
Script was verified In the system that it went to the correct location. Pharmacy called and closed at this time. Will open back after 2.

## 2020-11-11 NOTE — Telephone Encounter (Addendum)
[employer] on [address]. Pharmacy states that they did not received the prescription sent on 11/09/2020 for Lasix 40 mg's x 3 days. [facility] Pharmacist took a verbal order over the phone for the prescription by RN Jodi Geralds. Patient contacted on home phone with no answer and no message system. Contacted patient by voicemail and left a detailed message advising patient that his prescription had been called in at the [facility] on [address].

## 2020-11-11 NOTE — Telephone Encounter (Signed)
Not sure if Amy in device clinic called the pt. I will forward call to device clinic as I am not seeing who called the pt.

## 2020-11-15 DIAGNOSIS — N401 Enlarged prostate with lower urinary tract symptoms: Secondary | ICD-10-CM | POA: Diagnosis not present

## 2020-11-16 ENCOUNTER — Other Ambulatory Visit: Payer: Self-pay | Admitting: Cardiology

## 2020-11-18 ENCOUNTER — Other Ambulatory Visit: Payer: Self-pay | Admitting: Cardiology

## 2020-11-22 DIAGNOSIS — R350 Frequency of micturition: Secondary | ICD-10-CM | POA: Diagnosis not present

## 2020-11-22 DIAGNOSIS — R3914 Feeling of incomplete bladder emptying: Secondary | ICD-10-CM | POA: Diagnosis not present

## 2020-11-22 DIAGNOSIS — N401 Enlarged prostate with lower urinary tract symptoms: Secondary | ICD-10-CM | POA: Diagnosis not present

## 2020-11-22 DIAGNOSIS — R972 Elevated prostate specific antigen [PSA]: Secondary | ICD-10-CM | POA: Diagnosis not present

## 2020-11-23 ENCOUNTER — Other Ambulatory Visit: Payer: Self-pay | Admitting: Cardiology

## 2020-11-23 NOTE — Progress Notes (Signed)
Remote ICD transmission.   

## 2020-12-01 MED ORDER — METOPROLOL SUCCINATE ER 200 MG PO TB24: ORAL_TABLET | ORAL | 1 refills | Status: DC

## 2020-12-01 NOTE — Telephone Encounter (Signed)
Spoke to pt who confirms he is still taking Toprol 200 mg daily. Pt aware Rx will be sent to pharmacy. He appreciates the follow up

## 2020-12-01 NOTE — Addendum Note (Signed)
Addended by: Stanton Kidney on: 12/01/2020 09:55 AM   Modules accepted: Orders

## 2020-12-07 ENCOUNTER — Ambulatory Visit (INDEPENDENT_AMBULATORY_CARE_PROVIDER_SITE_OTHER): Payer: Medicare Other | Admitting: Podiatry

## 2020-12-07 ENCOUNTER — Encounter: Payer: Self-pay | Admitting: Podiatry

## 2020-12-07 ENCOUNTER — Other Ambulatory Visit: Payer: Self-pay

## 2020-12-07 DIAGNOSIS — M79674 Pain in right toe(s): Secondary | ICD-10-CM

## 2020-12-07 DIAGNOSIS — I110 Hypertensive heart disease with heart failure: Secondary | ICD-10-CM | POA: Insufficient documentation

## 2020-12-07 DIAGNOSIS — M79675 Pain in left toe(s): Secondary | ICD-10-CM

## 2020-12-07 DIAGNOSIS — B351 Tinea unguium: Secondary | ICD-10-CM

## 2020-12-07 DIAGNOSIS — N401 Enlarged prostate with lower urinary tract symptoms: Secondary | ICD-10-CM | POA: Insufficient documentation

## 2020-12-07 DIAGNOSIS — H9190 Unspecified hearing loss, unspecified ear: Secondary | ICD-10-CM | POA: Insufficient documentation

## 2020-12-07 DIAGNOSIS — E7800 Pure hypercholesterolemia, unspecified: Secondary | ICD-10-CM | POA: Insufficient documentation

## 2020-12-07 DIAGNOSIS — J300 Vasomotor rhinitis: Secondary | ICD-10-CM | POA: Insufficient documentation

## 2020-12-12 NOTE — Progress Notes (Signed)
  Subjective:  Patient ID: Mike Gray, male    DOB: 09/10/1941,  MRN: [id number]  Mike Gray presents to clinic today for painful thick toenails that are difficult to trim. Pain interferes with ambulation. Aggravating factors include wearing enclosed shoe gear. Pain is relieved with periodic professional debridement.[name] Kitchen  He voices no new pedal problems on today's visit.  No Known Allergies   Objective:   Constitutional Mike Gray is a pleasant 80 y.o. Caucasian male, WD, WN in NAD.[name] Kitchen AAO x 3.   Vascular Dorsalis pedis pulses palpable bilaterally. Posterior tibial pulses palpable bilaterally.Capillary refill normal to all digits.No cyanosis or clubbing noted Pedal hair growth sparse b/l. Lower extremity skin temperature gradient within normal limits.  Neurologic Normal speech. Oriented to person, place, and time.Epicritic sensation to light touch grossly present bilaterally. Protective sensation intact 5/5 intact bilaterally with 10g monofilament b/l. Vibratory sensation intact b/l. Proprioception intact bilaterally.  Dermatologic Pedal skin with normal turgor, texture and tone bilaterally. No open wounds bilaterally. No interdigital macerations bilaterally. Toenails 1-5 b/l elongated, discolored, dystrophic, thickened, crumbly with subungual debris and tenderness to dorsal palpation.  Orthopedic: Normal muscle strength 5/5 to all lower extremity muscle groups bilaterally. No pain crepitus or joint limitation noted with ROM b/l. No gross bony deformities bilaterally. Patient ambulates independent of any assistive aids. No bony tenderness.    Radiographs: None Assessment:   1. Pain due to onychomycosis of toenails of both feet    Plan:  Patient was evaluated and treated and all questions answered.  Onychomycosis with pain -Nails palliatively debridement as below -Educated on self-care  Procedure: Nail Debridement Rationale: Pain Type of Debridement: manual, sharp  debridement. Instrumentation: Nail nipper, rotary burr. Number of Nails: 10 -Examined patient. -Toenails 1-5 b/l were debrided in length and girth with sterile nail nippers and dremel. Offending nail borders debrided and curretaged b/l great toes. Borders cleansed with alcohol. Antibiotic ointment applied. No further treatment required by patient. -Patient to report any pedal injuries to medical professional immediately. -Patient to continue soft, supportive shoe gear daily. -Patient/POA to call should there be question/concern in the interim.  Return in about 3 months (around 03/06/2021).  Marzetta Board, DPM

## 2020-12-22 DIAGNOSIS — I472 Ventricular tachycardia: Secondary | ICD-10-CM | POA: Diagnosis not present

## 2020-12-22 DIAGNOSIS — I480 Paroxysmal atrial fibrillation: Secondary | ICD-10-CM | POA: Diagnosis not present

## 2020-12-22 DIAGNOSIS — Z0001 Encounter for general adult medical examination with abnormal findings: Secondary | ICD-10-CM | POA: Diagnosis not present

## 2020-12-22 DIAGNOSIS — Z79899 Other long term (current) drug therapy: Secondary | ICD-10-CM | POA: Diagnosis not present

## 2020-12-22 DIAGNOSIS — E7800 Pure hypercholesterolemia, unspecified: Secondary | ICD-10-CM | POA: Diagnosis not present

## 2020-12-22 DIAGNOSIS — F319 Bipolar disorder, unspecified: Secondary | ICD-10-CM | POA: Diagnosis not present

## 2020-12-22 DIAGNOSIS — I421 Obstructive hypertrophic cardiomyopathy: Secondary | ICD-10-CM | POA: Diagnosis not present

## 2020-12-22 DIAGNOSIS — I2510 Atherosclerotic heart disease of native coronary artery without angina pectoris: Secondary | ICD-10-CM | POA: Diagnosis not present

## 2020-12-22 DIAGNOSIS — Z23 Encounter for immunization: Secondary | ICD-10-CM | POA: Diagnosis not present

## 2021-01-06 DIAGNOSIS — I10 Essential (primary) hypertension: Secondary | ICD-10-CM | POA: Diagnosis not present

## 2021-01-06 DIAGNOSIS — J449 Chronic obstructive pulmonary disease, unspecified: Secondary | ICD-10-CM | POA: Diagnosis not present

## 2021-01-06 DIAGNOSIS — Z0000 Encounter for general adult medical examination without abnormal findings: Secondary | ICD-10-CM | POA: Diagnosis not present

## 2021-01-06 DIAGNOSIS — J300 Vasomotor rhinitis: Secondary | ICD-10-CM | POA: Diagnosis not present

## 2021-01-06 DIAGNOSIS — R944 Abnormal results of kidney function studies: Secondary | ICD-10-CM | POA: Diagnosis not present

## 2021-01-06 DIAGNOSIS — F319 Bipolar disorder, unspecified: Secondary | ICD-10-CM | POA: Diagnosis not present

## 2021-01-06 DIAGNOSIS — Z125 Encounter for screening for malignant neoplasm of prostate: Secondary | ICD-10-CM | POA: Diagnosis not present

## 2021-01-06 DIAGNOSIS — D6859 Other primary thrombophilia: Secondary | ICD-10-CM | POA: Diagnosis not present

## 2021-01-06 DIAGNOSIS — I4891 Unspecified atrial fibrillation: Secondary | ICD-10-CM | POA: Diagnosis not present

## 2021-01-06 DIAGNOSIS — N400 Enlarged prostate without lower urinary tract symptoms: Secondary | ICD-10-CM | POA: Diagnosis not present

## 2021-01-06 DIAGNOSIS — I509 Heart failure, unspecified: Secondary | ICD-10-CM | POA: Diagnosis not present

## 2021-01-06 DIAGNOSIS — E785 Hyperlipidemia, unspecified: Secondary | ICD-10-CM | POA: Diagnosis not present

## 2021-02-08 ENCOUNTER — Ambulatory Visit (INDEPENDENT_AMBULATORY_CARE_PROVIDER_SITE_OTHER): Payer: Medicare Other

## 2021-02-08 DIAGNOSIS — I472 Ventricular tachycardia, unspecified: Secondary | ICD-10-CM

## 2021-02-08 LAB — CUP PACEART REMOTE DEVICE CHECK
Battery Remaining Longevity: 14 mo
Battery Voltage: 2.88 V
Brady Statistic AP VP Percent: 99.74 %
Brady Statistic AP VS Percent: 0.12 %
Brady Statistic AS VP Percent: 0.14 %
Brady Statistic AS VS Percent: 0 %
Brady Statistic RA Percent Paced: 99.84 %
Brady Statistic RV Percent Paced: 99.15 %
Date Time Interrogation Session: 20220419001704
HighPow Impedance: 65 Ohm
Implantable Lead Implant Date: 20070328
Implantable Lead Implant Date: 20150323
Implantable Lead Location: 753859
Implantable Lead Location: 753860
Implantable Lead Model: 5076
Implantable Pulse Generator Implant Date: 20150323
Lead Channel Impedance Value: 304 Ohm
Lead Channel Impedance Value: 361 Ohm
Lead Channel Impedance Value: 475 Ohm
Lead Channel Pacing Threshold Amplitude: 0.625 V
Lead Channel Pacing Threshold Amplitude: 0.625 V
Lead Channel Pacing Threshold Pulse Width: 0.4 ms
Lead Channel Pacing Threshold Pulse Width: 0.4 ms
Lead Channel Sensing Intrinsic Amplitude: 1.875 mV
Lead Channel Sensing Intrinsic Amplitude: 1.875 mV
Lead Channel Sensing Intrinsic Amplitude: 5.75 mV
Lead Channel Sensing Intrinsic Amplitude: 5.75 mV
Lead Channel Setting Pacing Amplitude: 2 V
Lead Channel Setting Pacing Amplitude: 2.5 V
Lead Channel Setting Pacing Pulse Width: 0.4 ms
Lead Channel Setting Sensing Sensitivity: 0.3 mV

## 2021-02-09 ENCOUNTER — Other Ambulatory Visit: Payer: Self-pay | Admitting: Physician Assistant

## 2021-02-25 ENCOUNTER — Other Ambulatory Visit: Payer: Self-pay | Admitting: Cardiology

## 2021-02-25 DIAGNOSIS — Z23 Encounter for immunization: Secondary | ICD-10-CM | POA: Diagnosis not present

## 2021-02-25 NOTE — Progress Notes (Signed)
Remote ICD transmission.   

## 2021-02-28 ENCOUNTER — Encounter: Payer: Self-pay | Admitting: Cardiology

## 2021-02-28 ENCOUNTER — Ambulatory Visit (INDEPENDENT_AMBULATORY_CARE_PROVIDER_SITE_OTHER): Payer: Medicare Other | Admitting: Cardiology

## 2021-02-28 VITALS — BP 132/96 | HR 75 | Ht 69.0 in | Wt 159.0 lb

## 2021-02-28 DIAGNOSIS — I480 Paroxysmal atrial fibrillation: Secondary | ICD-10-CM

## 2021-02-28 NOTE — Patient Instructions (Signed)
Medication Instructions:  Your physician recommends that you continue on your current medications as directed. Please refer to the Current Medication list given to you today.  *If you need a refill on your cardiac medications before your next appointment, please call your pharmacy*   Lab Work: None ordered   Testing/Procedures: None ordered   Follow-Up: At [facility], you and your health needs are our priority.  As part of our continuing mission to provide you with exceptional heart care, we have created designated Provider Care Teams.  These Care Teams include your primary Cardiologist (physician) and Advanced Practice Providers (APPs -  Physician Assistants and Nurse Practitioners) who all work together to provide you with the care you need, when you need it.  We recommend signing up for the patient portal called "MyChart".  Sign up information is provided on this After Visit Summary.  MyChart is used to connect with patients for Virtual Visits (Telemedicine).  Patients are able to view lab/test results, encounter notes, upcoming appointments, etc.  Non-urgent messages can be sent to your provider as well.   To learn more about what you can do with MyChart, go to [email or web address].    Remote monitoring is used to monitor your Pacemaker or ICD from home. This monitoring reduces the number of office visits required to check your device to one time per year. It allows [place] to keep an eye on the functioning of your device to ensure it is working properly. You are scheduled for a device check from home on 05/10/2021. You may send your transmission at any time that day. If you have a wireless device, the transmission will be sent automatically. After your physician reviews your transmission, you will receive a postcard with your next transmission date.  Your next appointment:   6 month(s)  The format for your next appointment:   In Person  Provider:   Tommye Standard, PA-C, Chanetta Marshall,  NP or Lollie Marrow, [place]   Thank you for choosing [facility]!!   Trinidad Curet, RN [phone]

## 2021-02-28 NOTE — Progress Notes (Signed)
Electrophysiology Office Note   Date:  02/28/2021   ID:  Mike Gray, DOB 08/03/1941, MRN [id number]  PCP:  [name], MD  Cardiologist:   Primary Electrophysiologist:  Kenyan Karnes [name], MD    No chief complaint on file.    History of Present Illness: Mike Gray is a 80 y.o. male who is being seen today for the evaluation of ICD/hypertrophic cardiomyopathy at the request of [name], [name], MD. Presenting today for electrophysiology evaluation.    He has a history of atrial fibrillation, CHF, COPD, GI bleed, hypertension.  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  September 2020 was hospitalized with heart failure, atrial fibrillation, and progressive weakness and fatigue.  He was in atrial fibrillation was loaded on amiodarone.  He has a history of atrial fibrillation, CHF, COPD, GI bleed, and hypertension.  He also has an ICD implanted for nonsustained VT with hypertrophic cardiomyopathy.  He has a history of complete heart block in his past as well.  Today, denies symptoms of palpitations, chest pain, shortness of breath, orthopnea, PND, lower extremity edema, claudication, dizziness, presyncope, syncope, bleeding, or neurologic sequela. The patient is tolerating medications without difficulties.  He is currently feeling well.  He has no chest pain or shortness of breath.  He is able do all of his daily activities and is without restriction.   Past Medical History:  Diagnosis Date  . Angiodysplasia   . Atrial fibrillation ([place])   . CHF (congestive heart failure) ([place])   . COPD (chronic obstructive pulmonary disease) ([place])   . GI bleed   . Hypertension    Past Surgical History:  Procedure Laterality Date  . COLONOSCOPY    . PACEMAKER IMPLANT       Current Outpatient Medications  Medication Sig Dispense Refill  . amiodarone (PACERONE) 200 MG tablet Take  1/2 (one-half) tablet by mouth once daily 45 tablet 3  . apixaban (ELIQUIS) 5 MG TABS tablet Take 1 tablet (5 mg total) by mouth 2 (two) times daily. 60 tablet 5  . atorvastatin (LIPITOR) 20 MG tablet Take 20 mg by mouth daily.    . divalproex (DEPAKOTE ER) 500 MG 24 hr tablet Take 1,000 mg by mouth daily.     . finasteride (PROSCAR) 5 MG tablet Take 5 mg by mouth daily.    . furosemide (LASIX) 40 MG tablet Take 1 tablet (40 mg total) once daily for 3 days. 10 tablet 0  . losartan-hydrochlorothiazide (HYZAAR) 100-25 MG tablet Take 1 tablet by mouth once daily 90 tablet 2  . metoprolol (TOPROL-XL) 200 MG 24 hr tablet TAKE 1 TABLET BY MOUTH ONCE DAILY WITH A MEAL OR  IMMEDIATELY  FOLLOWING  A  MEAL (DOSE INCREASE) 90 tablet 1  . oxybutynin (DITROPAN-XL) 10 MG 24 hr tablet Take 10 mg by mouth daily.    . tamsulosin (FLOMAX) 0.4 MG CAPS capsule Take 1 capsule (0.4 mg total) by mouth daily after supper. 30 capsule 0   No current facility-administered medications for this visit.    Allergies:   Patient has no known allergies.   Social History:  The patient  reports that he quit smoking about 9 years ago. His smoking use included cigarettes. He has never used smokeless tobacco. He reports previous alcohol use. He reports that he does not use drugs.   Family History:  The patient's family history includes Breast cancer (age of onset:  29) in his father; Cancer in an other family member; Healthy in his mother.   ROS:  Please see the history of present illness.   Otherwise, review of systems is positive for none.   All other systems are reviewed and negative.   PHYSICAL EXAM: VS:  BP (!) 132/96   Pulse 75   Ht 5\' 9"  (1.753 m)   Wt 159 lb (72.1 kg)   BMI 23.48 kg/m  , BMI Body mass index is 23.48 kg/m. GEN: Well nourished, well developed, in no acute distress  HEENT: normal  Neck: no JVD, carotid bruits, or masses Cardiac: RRR; no murmurs, rubs, or gallops,no edema  Respiratory:  clear to  auscultation bilaterally, normal work of breathing GI: soft, nontender, nondistended, + BS MS: no deformity or atrophy  Skin: warm and dry, device site well healed Neuro:  Strength and sensation are intact Psych: euthymic mood, full affect  EKG:  EKG is ordered today. Personal review of the ekg ordered shows sinus rhythm, ventricular paced  Personal review of the device interrogation today. Results in [place]: 07/04/2020: B Natriuretic Peptide 794.8 07/06/2020: ALT 35; TSH 0.315 07/07/2020: Hemoglobin 15.8; Platelets 231 07/09/2020: Magnesium 2.1 07/22/2020: BUN 22; Creatinine, Ser 1.29; Potassium 4.4; Sodium 138    Lipid Panel  No results found for: CHOL, TRIG, HDL, CHOLHDL, VLDL, LDLCALC, LDLDIRECT   Wt Readings from Last 3 Encounters:  02/28/21 159 lb (72.1 kg)  08/26/20 174 lb 12.8 oz (79.3 kg)  07/15/20 171 lb (77.6 kg)      Other studies Reviewed: Additional studies/ records that were reviewed today include: TTE 07/05/2020 1. Left ventricular ejection fraction, by estimation, is 40 to 45%. The  left ventricle has mildly decreased function. The left ventricle  demonstrates global hypokinesis. There is moderate asymmetric left  ventricular hypertrophy of the septal segment.  Left ventricular diastolic function could not be evaluated.  2. Right ventricular systolic function is normal. The right ventricular  size is normal. A n AICD wire is visualized.  3. The mitral valve is abnormal. Trivial mitral valve regurgitation.  4. The aortic valve is tricuspid. Aortic valve regurgitation is not  visualized. Mild to moderate aortic valve sclerosis/calcification is  present, without any evidence of aortic stenosis.  5. Aortic dilatation noted. There is mild dilatation of the ascending  aorta, measuring 38 mm.  6. The inferior vena cava is dilated in size with >50% respiratory  variability, suggesting right atrial pressure of 8 mmHg.    ASSESSMENT AND  PLAN:  1.  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  Device functioning appropriately.  Currently on Toprol-XL.  No changes.    2.  Paroxysmal atrial fibrillation:  Currently on Eliquis.  CHA2DS2-VASc of 3.  Has been loaded on amiodarone.  High risk medication monitoring.    3.  Hypertension currently well controlled  4.  Nonsustained VT: Interrogation.  No change  5.  Complete heart block: Status post Medtronic.[name] Kitchen  No changes    Current medicines are reviewed at length with the patient today.   The patient does not have concerns regarding his medicines.  The following changes were made today: none  Labs/ tests ordered today include:  Orders Placed This Encounter  Procedures  . EKG 12-Lead    Disposition:   FU with Desere Gwin 6 months  Signed, Linford Quintela [name], MD  02/28/2021 3:15 PM     [facility] [address] [place]  [address] [phone] (office) [phone] (fax)

## 2021-03-08 DIAGNOSIS — H6122 Impacted cerumen, left ear: Secondary | ICD-10-CM | POA: Insufficient documentation

## 2021-03-08 DIAGNOSIS — J300 Vasomotor rhinitis: Secondary | ICD-10-CM | POA: Diagnosis not present

## 2021-03-08 DIAGNOSIS — J343 Hypertrophy of nasal turbinates: Secondary | ICD-10-CM | POA: Diagnosis not present

## 2021-03-22 ENCOUNTER — Encounter: Payer: Self-pay | Admitting: Podiatry

## 2021-03-22 ENCOUNTER — Ambulatory Visit (INDEPENDENT_AMBULATORY_CARE_PROVIDER_SITE_OTHER): Payer: Medicare Other | Admitting: Podiatry

## 2021-03-22 ENCOUNTER — Other Ambulatory Visit: Payer: Self-pay

## 2021-03-22 DIAGNOSIS — M79674 Pain in right toe(s): Secondary | ICD-10-CM | POA: Diagnosis not present

## 2021-03-22 DIAGNOSIS — I4729 Other ventricular tachycardia: Secondary | ICD-10-CM | POA: Insufficient documentation

## 2021-03-22 DIAGNOSIS — B351 Tinea unguium: Secondary | ICD-10-CM | POA: Diagnosis not present

## 2021-03-22 DIAGNOSIS — I472 Ventricular tachycardia, unspecified: Secondary | ICD-10-CM | POA: Insufficient documentation

## 2021-03-22 DIAGNOSIS — M79675 Pain in left toe(s): Secondary | ICD-10-CM

## 2021-03-22 NOTE — Progress Notes (Signed)
  Subjective:  Patient ID: Mike Gray, male    DOB: Aug 17, 1941,  MRN: [id number]  80 y.o. male presents painful thick toenails that are difficult to trim. Pain interferes with ambulation. Aggravating factors include wearing enclosed shoe gear. Pain is relieved with periodic professional debridement.  No Known Allergies  Review of Systems: Negative except as noted in the HPI.   Objective:   Constitutional Pt is a pleasant 80 y.o. Caucasian male in NAD. AAO x 3.   Vascular Neurovascular status unchanged b/l lower extremities. Capillary refill time to digits immediate b/l. Palpable pedal pulses b/l LE. Pedal hair sparse. Lower extremity skin temperature gradient within normal limits. No pain with calf compression b/l. No edema noted b/l lower extremities.  Neurologic Protective sensation intact 5/5 intact bilaterally with 10g monofilament b/l.  Dermatologic Pedal skin with normal turgor, texture and tone bilaterally. No open wounds bilaterally. No interdigital macerations bilaterally. Toenails 1-5 b/l elongated, discolored, dystrophic, thickened, crumbly with subungual debris and tenderness to dorsal palpation. Incurvated nailplate bilateral border(s) L hallux and R hallux.  Nail border hypertrophy present. There is tenderness to palpation. Sign(s) of infection: no clinical signs of infection noted on examination today..  Orthopedic: Normal muscle strength 5/5 to all lower extremity muscle groups bilaterally. No pain crepitus or joint limitation noted with ROM b/l. No gross bony deformities bilaterally. Patient ambulates independent of any assistive aids.   Radiographs: None Assessment:   1. Pain due to onychomycosis of toenails of both feet    Plan:  Patient was evaluated and treated and all questions answered.  Onychomycosis with pain -Nails palliatively debridement as below. -Educated on self-care  Procedure: Nail Debridement Rationale: Pain Type of Debridement: manual, sharp  debridement. Instrumentation: Nail nipper, rotary burr. Number of Nails: 10  -Examined patient. -Patient to continue soft, supportive shoe gear daily. -Toenails 1-5 b/l were debrided in length and girth with sterile nail nippers and dremel without iatrogenic bleeding.  -Offending nail border debrided and curretaged L hallux and R hallux utilizing sterile nail nipper and currette. Border cleansed with alcohol and triple antibiotic applied. No further treatment required by patient/caregiver. Discussed treatment option of permanent partial/total nail avulsions for chronic ingrown toenails. He would like to continue conservative care for now and will think about it. -Patient to report any pedal injuries to medical professional immediately. -Patient/POA to call should there be question/concern in the interim.  Return in about 3 months (around 06/22/2021).  Marzetta Board, DPM

## 2021-05-10 ENCOUNTER — Ambulatory Visit (INDEPENDENT_AMBULATORY_CARE_PROVIDER_SITE_OTHER): Payer: Medicare Other

## 2021-05-10 DIAGNOSIS — Z95810 Presence of automatic (implantable) cardiac defibrillator: Secondary | ICD-10-CM | POA: Diagnosis not present

## 2021-05-10 LAB — CUP PACEART REMOTE DEVICE CHECK
Battery Remaining Longevity: 11 mo
Battery Voltage: 2.86 V
Brady Statistic AP VP Percent: 99.72 %
Brady Statistic AP VS Percent: 0.13 %
Brady Statistic AS VP Percent: 0.15 %
Brady Statistic AS VS Percent: 0 %
Brady Statistic RA Percent Paced: 99.83 %
Brady Statistic RV Percent Paced: 99.39 %
Date Time Interrogation Session: 20220719043823
HighPow Impedance: 70 Ohm
Implantable Lead Implant Date: 20070328
Implantable Lead Implant Date: 20150323
Implantable Lead Location: 753859
Implantable Lead Location: 753860
Implantable Lead Model: 5076
Implantable Pulse Generator Implant Date: 20150323
Lead Channel Impedance Value: 285 Ohm
Lead Channel Impedance Value: 361 Ohm
Lead Channel Impedance Value: 475 Ohm
Lead Channel Pacing Threshold Amplitude: 0.625 V
Lead Channel Pacing Threshold Amplitude: 0.625 V
Lead Channel Pacing Threshold Pulse Width: 0.4 ms
Lead Channel Pacing Threshold Pulse Width: 0.4 ms
Lead Channel Sensing Intrinsic Amplitude: 1 mV
Lead Channel Sensing Intrinsic Amplitude: 1 mV
Lead Channel Sensing Intrinsic Amplitude: 9.5 mV
Lead Channel Sensing Intrinsic Amplitude: 9.5 mV
Lead Channel Setting Pacing Amplitude: 2 V
Lead Channel Setting Pacing Amplitude: 2.5 V
Lead Channel Setting Pacing Pulse Width: 0.4 ms
Lead Channel Setting Sensing Sensitivity: 0.3 mV

## 2021-05-17 DIAGNOSIS — R350 Frequency of micturition: Secondary | ICD-10-CM | POA: Diagnosis not present

## 2021-05-17 DIAGNOSIS — R3914 Feeling of incomplete bladder emptying: Secondary | ICD-10-CM | POA: Diagnosis not present

## 2021-05-17 DIAGNOSIS — N401 Enlarged prostate with lower urinary tract symptoms: Secondary | ICD-10-CM | POA: Diagnosis not present

## 2021-05-17 DIAGNOSIS — R972 Elevated prostate specific antigen [PSA]: Secondary | ICD-10-CM | POA: Diagnosis not present

## 2021-05-27 DIAGNOSIS — I4891 Unspecified atrial fibrillation: Secondary | ICD-10-CM | POA: Diagnosis not present

## 2021-05-27 DIAGNOSIS — R634 Abnormal weight loss: Secondary | ICD-10-CM | POA: Diagnosis not present

## 2021-05-27 DIAGNOSIS — E261 Secondary hyperaldosteronism: Secondary | ICD-10-CM | POA: Diagnosis not present

## 2021-05-27 DIAGNOSIS — F319 Bipolar disorder, unspecified: Secondary | ICD-10-CM | POA: Diagnosis not present

## 2021-05-27 DIAGNOSIS — I509 Heart failure, unspecified: Secondary | ICD-10-CM | POA: Diagnosis not present

## 2021-05-27 DIAGNOSIS — J441 Chronic obstructive pulmonary disease with (acute) exacerbation: Secondary | ICD-10-CM | POA: Diagnosis not present

## 2021-05-27 DIAGNOSIS — J300 Vasomotor rhinitis: Secondary | ICD-10-CM | POA: Diagnosis not present

## 2021-05-27 DIAGNOSIS — I2510 Atherosclerotic heart disease of native coronary artery without angina pectoris: Secondary | ICD-10-CM | POA: Diagnosis not present

## 2021-05-30 ENCOUNTER — Other Ambulatory Visit: Payer: Self-pay | Admitting: Family Medicine

## 2021-05-30 ENCOUNTER — Other Ambulatory Visit: Payer: Self-pay | Admitting: Internal Medicine

## 2021-05-30 ENCOUNTER — Ambulatory Visit
Admission: RE | Admit: 2021-05-30 | Discharge: 2021-05-30 | Disposition: A | Discharge status: Home / Self Care | Payer: Medicare Other | Source: Ambulatory Visit | Attending: Family Medicine | Admitting: Family Medicine

## 2021-05-30 DIAGNOSIS — J449 Chronic obstructive pulmonary disease, unspecified: Secondary | ICD-10-CM

## 2021-06-02 DIAGNOSIS — J441 Chronic obstructive pulmonary disease with (acute) exacerbation: Secondary | ICD-10-CM | POA: Diagnosis not present

## 2021-06-02 DIAGNOSIS — N400 Enlarged prostate without lower urinary tract symptoms: Secondary | ICD-10-CM | POA: Diagnosis not present

## 2021-06-02 DIAGNOSIS — R0902 Hypoxemia: Secondary | ICD-10-CM | POA: Diagnosis not present

## 2021-06-02 DIAGNOSIS — I2510 Atherosclerotic heart disease of native coronary artery without angina pectoris: Secondary | ICD-10-CM | POA: Diagnosis not present

## 2021-06-02 DIAGNOSIS — I10 Essential (primary) hypertension: Secondary | ICD-10-CM | POA: Diagnosis not present

## 2021-06-02 DIAGNOSIS — E261 Secondary hyperaldosteronism: Secondary | ICD-10-CM | POA: Diagnosis not present

## 2021-06-02 DIAGNOSIS — I509 Heart failure, unspecified: Secondary | ICD-10-CM | POA: Diagnosis not present

## 2021-06-02 NOTE — Progress Notes (Signed)
Remote ICD transmission.   

## 2021-06-10 ENCOUNTER — Other Ambulatory Visit: Payer: Self-pay | Admitting: Cardiology

## 2021-07-05 ENCOUNTER — Other Ambulatory Visit: Payer: Self-pay

## 2021-07-05 ENCOUNTER — Ambulatory Visit (INDEPENDENT_AMBULATORY_CARE_PROVIDER_SITE_OTHER): Payer: Medicare Other | Admitting: Podiatry

## 2021-07-05 ENCOUNTER — Encounter: Payer: Self-pay | Admitting: Podiatry

## 2021-07-05 DIAGNOSIS — B351 Tinea unguium: Secondary | ICD-10-CM | POA: Diagnosis not present

## 2021-07-05 DIAGNOSIS — M79674 Pain in right toe(s): Secondary | ICD-10-CM

## 2021-07-05 DIAGNOSIS — M79675 Pain in left toe(s): Secondary | ICD-10-CM | POA: Diagnosis not present

## 2021-07-05 NOTE — Progress Notes (Signed)
Subjective: Mike Gray is a pleasant 80 y.o. male patient seen today for thick, elongated toenails b/l feet which are tender when wearing enclosed shoe gear.   He states his left great toe is a little tender on the lateral border. Denies any redness, drainage or swelling.   PCP is [name], MD. Last visit was: 12/22/2020.  No Known Allergies  Objective: Physical Exam  General: Mike Gray is a pleasant 80 y.o. Caucasian male, WD, WN in NAD. AAO x 3.   Vascular:  Capillary refill time to digits immediate b/l. Palpable DP pulse(s) b/l lower extremities Palpable PT pulse(s) b/l lower extremities Pedal hair sparse. Lower extremity skin temperature gradient within normal limits. No pain with calf compression b/l. No edema noted b/l lower extremities.  Dermatological:  Skin warm and supple b/l lower extremities. No open wounds b/l lower extremities. No interdigital macerations b/l lower extremities. Toenails 1-5 b/l elongated, discolored, dystrophic, thickened, crumbly with subungual debris and tenderness to dorsal palpation. Incurvated nailplate lateral border(s) L hallux and R hallux.  Nail border hypertrophy minimal. There is tenderness to palpation. Sign(s) of infection: no clinical signs of infection noted on examination today..  Musculoskeletal:  Normal muscle strength 5/5 to all lower extremity muscle groups bilaterally. Patient ambulates independent of any assistive aids.  Neurological:  Protective sensation intact 5/5 intact bilaterally with 10g monofilament b/l.  Assessment and Plan:  1. Pain due to onychomycosis of toenails of both feet      -No new findings. No new orders. -Patient to continue soft, supportive shoe gear daily. -Toenails 1-5 b/l were debrided in length and girth with sterile nail nippers and dremel without iatrogenic bleeding.  -Offending nail border debrided and curretaged L hallux and R hallux utilizing sterile nail nipper and currette. Border  cleansed with alcohol and triple antibiotic applied. No further treatment required by patient/caregiver. -Patient to report any pedal injuries to medical professional immediately. -Patient/POA to call should there be question/concern in the interim.  Return in about 3 months (around 10/04/2021).  Marzetta Board, DPM

## 2021-07-11 ENCOUNTER — Other Ambulatory Visit: Payer: Self-pay | Admitting: *Deleted

## 2021-07-11 MED ORDER — APIXABAN 5 MG PO TABS: 5.0000 mg | ORAL_TABLET | Freq: Two times a day (BID) | ORAL | 5 refills | Status: AC

## 2021-07-11 NOTE — Telephone Encounter (Signed)
Eliquis 5mg  paper refill request received. Patient is 80 years old, weight-72.1kg, Crea-1.25 on 12/22/2020 via [facility] at [place], [place], and last seen by [name] on 02/28/2021. Dose is appropriate based on dosing criteria. Will send in refill to requested pharmacy.

## 2021-07-19 ENCOUNTER — Emergency Department (HOSPITAL_COMMUNITY): Payer: Medicare Other

## 2021-07-19 ENCOUNTER — Inpatient Hospital Stay (HOSPITAL_COMMUNITY)
Admission: EM | Admit: 2021-07-19 | Discharge: 2021-07-28 | DRG: 189 | Disposition: A | Discharge status: Other Acute Inpatient Hospital | Payer: Medicare Other | Attending: Internal Medicine | Admitting: Internal Medicine

## 2021-07-19 ENCOUNTER — Inpatient Hospital Stay (HOSPITAL_COMMUNITY): Payer: Medicare Other

## 2021-07-19 ENCOUNTER — Encounter (HOSPITAL_COMMUNITY): Payer: Self-pay

## 2021-07-19 DIAGNOSIS — I5022 Chronic systolic (congestive) heart failure: Secondary | ICD-10-CM | POA: Diagnosis not present

## 2021-07-19 DIAGNOSIS — N179 Acute kidney failure, unspecified: Secondary | ICD-10-CM | POA: Diagnosis present

## 2021-07-19 DIAGNOSIS — J189 Pneumonia, unspecified organism: Secondary | ICD-10-CM | POA: Diagnosis present

## 2021-07-19 DIAGNOSIS — R0689 Other abnormalities of breathing: Secondary | ICD-10-CM | POA: Diagnosis not present

## 2021-07-19 DIAGNOSIS — I422 Other hypertrophic cardiomyopathy: Secondary | ICD-10-CM | POA: Diagnosis present

## 2021-07-19 DIAGNOSIS — N1831 Chronic kidney disease, stage 3a: Secondary | ICD-10-CM

## 2021-07-19 DIAGNOSIS — J9602 Acute respiratory failure with hypercapnia: Secondary | ICD-10-CM | POA: Diagnosis present

## 2021-07-19 DIAGNOSIS — I130 Hypertensive heart and chronic kidney disease with heart failure and stage 1 through stage 4 chronic kidney disease, or unspecified chronic kidney disease: Secondary | ICD-10-CM | POA: Diagnosis present

## 2021-07-19 DIAGNOSIS — Z66 Do not resuscitate: Secondary | ICD-10-CM | POA: Diagnosis present

## 2021-07-19 DIAGNOSIS — R296 Repeated falls: Secondary | ICD-10-CM | POA: Diagnosis present

## 2021-07-19 DIAGNOSIS — I442 Atrioventricular block, complete: Secondary | ICD-10-CM | POA: Diagnosis present

## 2021-07-19 DIAGNOSIS — I5032 Chronic diastolic (congestive) heart failure: Secondary | ICD-10-CM | POA: Diagnosis present

## 2021-07-19 DIAGNOSIS — I517 Cardiomegaly: Secondary | ICD-10-CM | POA: Diagnosis not present

## 2021-07-19 DIAGNOSIS — R059 Cough, unspecified: Secondary | ICD-10-CM | POA: Diagnosis not present

## 2021-07-19 DIAGNOSIS — R4182 Altered mental status, unspecified: Secondary | ICD-10-CM

## 2021-07-19 DIAGNOSIS — J9622 Acute and chronic respiratory failure with hypercapnia: Secondary | ICD-10-CM | POA: Diagnosis not present

## 2021-07-19 DIAGNOSIS — J9692 Respiratory failure, unspecified with hypercapnia: Secondary | ICD-10-CM | POA: Diagnosis not present

## 2021-07-19 DIAGNOSIS — J9601 Acute respiratory failure with hypoxia: Secondary | ICD-10-CM | POA: Diagnosis present

## 2021-07-19 DIAGNOSIS — F17210 Nicotine dependence, cigarettes, uncomplicated: Secondary | ICD-10-CM | POA: Diagnosis present

## 2021-07-19 DIAGNOSIS — E871 Hypo-osmolality and hyponatremia: Secondary | ICD-10-CM | POA: Diagnosis present

## 2021-07-19 DIAGNOSIS — R7989 Other specified abnormal findings of blood chemistry: Secondary | ICD-10-CM

## 2021-07-19 DIAGNOSIS — R918 Other nonspecific abnormal finding of lung field: Secondary | ICD-10-CM | POA: Diagnosis not present

## 2021-07-19 DIAGNOSIS — I10 Essential (primary) hypertension: Secondary | ICD-10-CM | POA: Diagnosis not present

## 2021-07-19 DIAGNOSIS — J9621 Acute and chronic respiratory failure with hypoxia: Secondary | ICD-10-CM | POA: Diagnosis not present

## 2021-07-19 DIAGNOSIS — R062 Wheezing: Secondary | ICD-10-CM | POA: Diagnosis not present

## 2021-07-19 DIAGNOSIS — Z803 Family history of malignant neoplasm of breast: Secondary | ICD-10-CM

## 2021-07-19 DIAGNOSIS — N182 Chronic kidney disease, stage 2 (mild): Secondary | ICD-10-CM | POA: Diagnosis present

## 2021-07-19 DIAGNOSIS — J181 Lobar pneumonia, unspecified organism: Secondary | ICD-10-CM | POA: Diagnosis not present

## 2021-07-19 DIAGNOSIS — J9811 Atelectasis: Secondary | ICD-10-CM | POA: Diagnosis not present

## 2021-07-19 DIAGNOSIS — I472 Ventricular tachycardia, unspecified: Secondary | ICD-10-CM | POA: Diagnosis present

## 2021-07-19 DIAGNOSIS — Z79899 Other long term (current) drug therapy: Secondary | ICD-10-CM

## 2021-07-19 DIAGNOSIS — F319 Bipolar disorder, unspecified: Secondary | ICD-10-CM | POA: Diagnosis present

## 2021-07-19 DIAGNOSIS — Z95810 Presence of automatic (implantable) cardiac defibrillator: Secondary | ICD-10-CM

## 2021-07-19 DIAGNOSIS — Z20822 Contact with and (suspected) exposure to covid-19: Secondary | ICD-10-CM | POA: Diagnosis present

## 2021-07-19 DIAGNOSIS — R0902 Hypoxemia: Secondary | ICD-10-CM | POA: Diagnosis not present

## 2021-07-19 DIAGNOSIS — J441 Chronic obstructive pulmonary disease with (acute) exacerbation: Secondary | ICD-10-CM | POA: Diagnosis not present

## 2021-07-19 DIAGNOSIS — J90 Pleural effusion, not elsewhere classified: Secondary | ICD-10-CM | POA: Diagnosis not present

## 2021-07-19 DIAGNOSIS — I4510 Unspecified right bundle-branch block: Secondary | ICD-10-CM | POA: Diagnosis present

## 2021-07-19 DIAGNOSIS — R0600 Dyspnea, unspecified: Secondary | ICD-10-CM | POA: Diagnosis not present

## 2021-07-19 DIAGNOSIS — J432 Centrilobular emphysema: Secondary | ICD-10-CM | POA: Diagnosis present

## 2021-07-19 DIAGNOSIS — I2699 Other pulmonary embolism without acute cor pulmonale: Secondary | ICD-10-CM | POA: Diagnosis not present

## 2021-07-19 DIAGNOSIS — E785 Hyperlipidemia, unspecified: Secondary | ICD-10-CM | POA: Diagnosis present

## 2021-07-19 DIAGNOSIS — I480 Paroxysmal atrial fibrillation: Secondary | ICD-10-CM

## 2021-07-19 DIAGNOSIS — I509 Heart failure, unspecified: Secondary | ICD-10-CM | POA: Diagnosis not present

## 2021-07-19 DIAGNOSIS — J449 Chronic obstructive pulmonary disease, unspecified: Secondary | ICD-10-CM | POA: Diagnosis not present

## 2021-07-19 DIAGNOSIS — R0989 Other specified symptoms and signs involving the circulatory and respiratory systems: Secondary | ICD-10-CM

## 2021-07-19 DIAGNOSIS — G9341 Metabolic encephalopathy: Secondary | ICD-10-CM | POA: Diagnosis present

## 2021-07-19 DIAGNOSIS — R410 Disorientation, unspecified: Secondary | ICD-10-CM | POA: Diagnosis not present

## 2021-07-19 DIAGNOSIS — R000 Tachycardia, unspecified: Secondary | ICD-10-CM | POA: Diagnosis not present

## 2021-07-19 LAB — BLOOD GAS, ARTERIAL
Acid-Base Excess: 3.3 mmol/L — ABNORMAL HIGH (ref 0.0–2.0)
Bicarbonate: 36.1 mmol/L — ABNORMAL HIGH (ref 20.0–28.0)
FIO2: 100
O2 Saturation: 99.2 %
Patient temperature: 98.6
pCO2 arterial: 112 mmHg (ref 32.0–48.0)
pH, Arterial: 7.135 — CL (ref 7.350–7.450)
pO2, Arterial: 247 mmHg — ABNORMAL HIGH (ref 83.0–108.0)

## 2021-07-19 LAB — URINALYSIS, ROUTINE W REFLEX MICROSCOPIC
Bilirubin Urine: NEGATIVE
Glucose, UA: NEGATIVE mg/dL
Hgb urine dipstick: NEGATIVE
Ketones, ur: NEGATIVE mg/dL
Leukocytes,Ua: NEGATIVE
Nitrite: NEGATIVE
Protein, ur: NEGATIVE mg/dL
Specific Gravity, Urine: 1.03 (ref 1.005–1.030)
pH: 6 (ref 5.0–8.0)

## 2021-07-19 LAB — CBC WITH DIFFERENTIAL/PLATELET
Abs Immature Granulocytes: 0.04 10*3/uL (ref 0.00–0.07)
Basophils Absolute: 0 10*3/uL (ref 0.0–0.1)
Basophils Relative: 0 %
Eosinophils Absolute: 0 10*3/uL (ref 0.0–0.5)
Eosinophils Relative: 0 %
HCT: 42.2 % (ref 39.0–52.0)
Hemoglobin: 13.2 g/dL (ref 13.0–17.0)
Immature Granulocytes: 0 %
Lymphocytes Relative: 14 %
Lymphs Abs: 1.4 10*3/uL (ref 0.7–4.0)
MCH: 27.8 pg (ref 26.0–34.0)
MCHC: 31.3 g/dL (ref 30.0–36.0)
MCV: 88.8 fL (ref 80.0–100.0)
Monocytes Absolute: 1.1 10*3/uL — ABNORMAL HIGH (ref 0.1–1.0)
Monocytes Relative: 11 %
Neutro Abs: 7.5 10*3/uL (ref 1.7–7.7)
Neutrophils Relative %: 75 %
Platelets: 279 10*3/uL (ref 150–400)
RBC: 4.75 MIL/uL (ref 4.22–5.81)
RDW: 15.9 % — ABNORMAL HIGH (ref 11.5–15.5)
WBC: 10.1 10*3/uL (ref 4.0–10.5)
nRBC: 0 % (ref 0.0–0.2)

## 2021-07-19 LAB — I-STAT CHEM 8, ED
BUN: 48 mg/dL — ABNORMAL HIGH (ref 8–23)
Calcium, Ion: 1.35 mmol/L (ref 1.15–1.40)
Chloride: 90 mmol/L — ABNORMAL LOW (ref 98–111)
Creatinine, Ser: 1.7 mg/dL — ABNORMAL HIGH (ref 0.61–1.24)
Glucose, Bld: 103 mg/dL — ABNORMAL HIGH (ref 70–99)
HCT: 44 % (ref 39.0–52.0)
Hemoglobin: 15 g/dL (ref 13.0–17.0)
Potassium: 4.8 mmol/L (ref 3.5–5.1)
Sodium: 128 mmol/L — ABNORMAL LOW (ref 135–145)
TCO2: 37 mmol/L — ABNORMAL HIGH (ref 22–32)

## 2021-07-19 LAB — COMPREHENSIVE METABOLIC PANEL
ALT: 44 U/L (ref 0–44)
AST: 43 U/L — ABNORMAL HIGH (ref 15–41)
Albumin: 4.4 g/dL (ref 3.5–5.0)
Alkaline Phosphatase: 60 U/L (ref 38–126)
Anion gap: 15 (ref 5–15)
BUN: 51 mg/dL — ABNORMAL HIGH (ref 8–23)
CO2: 32 mmol/L (ref 22–32)
Calcium: 10.1 mg/dL (ref 8.9–10.3)
Chloride: 83 mmol/L — ABNORMAL LOW (ref 98–111)
Creatinine, Ser: 1.63 mg/dL — ABNORMAL HIGH (ref 0.61–1.24)
GFR, Estimated: 43 mL/min — ABNORMAL LOW (ref >60)
Glucose, Bld: 106 mg/dL — ABNORMAL HIGH (ref 70–99)
Potassium: 5 mmol/L (ref 3.5–5.1)
Sodium: 130 mmol/L — ABNORMAL LOW (ref 135–145)
Total Bilirubin: 0.8 mg/dL (ref 0.3–1.2)
Total Protein: 7.5 g/dL (ref 6.5–8.1)

## 2021-07-19 LAB — LACTIC ACID, PLASMA
Lactic Acid, Venous: 2.3 mmol/L (ref 0.5–1.9)
Lactic Acid, Venous: 0.8 mmol/L (ref 0.5–1.9)

## 2021-07-19 LAB — VALPROIC ACID LEVEL: Valproic Acid Lvl: 58 ug/mL (ref 50.0–100.0)

## 2021-07-19 LAB — PROTIME-INR
INR: 1.6 — ABNORMAL HIGH (ref 0.8–1.2)
Prothrombin Time: 18.6 seconds — ABNORMAL HIGH (ref 11.4–15.2)

## 2021-07-19 LAB — MRSA NEXT GEN BY PCR, NASAL: MRSA by PCR Next Gen: NOT DETECTED

## 2021-07-19 LAB — APTT: aPTT: 34 seconds (ref 24–36)

## 2021-07-19 LAB — AMMONIA: Ammonia: 60 umol/L — ABNORMAL HIGH (ref 9–35)

## 2021-07-19 LAB — BRAIN NATRIURETIC PEPTIDE: B Natriuretic Peptide: 3772.3 pg/mL — ABNORMAL HIGH (ref 0.0–100.0)

## 2021-07-19 LAB — GLUCOSE, CAPILLARY: Glucose-Capillary: 103 mg/dL — ABNORMAL HIGH (ref 70–99)

## 2021-07-19 MED ORDER — LACTULOSE ENEMA
300.0000 mL | Freq: Once | ORAL | Status: AC
  Administered 2021-07-19: 300 mL via RECTAL
  Filled 2021-07-19: qty 300

## 2021-07-19 MED ORDER — IPRATROPIUM-ALBUTEROL 0.5-2.5 (3) MG/3ML IN SOLN
6.0000 mL | Freq: Once | RESPIRATORY_TRACT | Status: AC
  Administered 2021-07-19: 6 mL via RESPIRATORY_TRACT
  Filled 2021-07-19: qty 6

## 2021-07-19 MED ORDER — ARFORMOTEROL TARTRATE 15 MCG/2ML IN NEBU
15.0000 ug | INHALATION_SOLUTION | Freq: Two times a day (BID) | RESPIRATORY_TRACT | Status: DC
  Administered 2021-07-19 – 2021-07-25 (×10): 15 ug via RESPIRATORY_TRACT
  Filled 2021-07-19 (×13): qty 2

## 2021-07-19 MED ORDER — CHLORHEXIDINE GLUCONATE CLOTH 2 % EX PADS
6.0000 | MEDICATED_PAD | Freq: Every day | CUTANEOUS | Status: DC
  Administered 2021-07-19 – 2021-07-24 (×6): 6 via TOPICAL

## 2021-07-19 MED ORDER — CHLORHEXIDINE GLUCONATE 0.12 % MT SOLN
15.0000 mL | Freq: Two times a day (BID) | OROMUCOSAL | Status: DC
  Administered 2021-07-19 – 2021-07-28 (×18): 15 mL via OROMUCOSAL
  Filled 2021-07-19 (×16): qty 15

## 2021-07-19 MED ORDER — BUDESONIDE 0.25 MG/2ML IN SUSP: 0.2500 mg | Freq: Two times a day (BID) | RESPIRATORY_TRACT | Status: DC

## 2021-07-19 MED ORDER — BUDESONIDE 0.5 MG/2ML IN SUSP
0.5000 mg | Freq: Two times a day (BID) | RESPIRATORY_TRACT | Status: DC
  Administered 2021-07-19 – 2021-07-28 (×16): 0.5 mg via RESPIRATORY_TRACT
  Filled 2021-07-19 (×19): qty 2

## 2021-07-19 MED ORDER — ENOXAPARIN SODIUM 40 MG/0.4ML IJ SOSY
40.0000 mg | PREFILLED_SYRINGE | Freq: Every day | INTRAMUSCULAR | Status: DC
  Administered 2021-07-20: 40 mg via SUBCUTANEOUS
  Filled 2021-07-19: qty 0.4

## 2021-07-19 MED ORDER — METHYLPREDNISOLONE SODIUM SUCC 40 MG IJ SOLR
40.0000 mg | Freq: Two times a day (BID) | INTRAMUSCULAR | Status: DC
  Administered 2021-07-20 (×2): 40 mg via INTRAVENOUS
  Filled 2021-07-19 (×2): qty 1

## 2021-07-19 MED ORDER — METHYLPREDNISOLONE SODIUM SUCC 125 MG IJ SOLR
125.0000 mg | Freq: Once | INTRAMUSCULAR | Status: AC
  Administered 2021-07-19: 125 mg via INTRAVENOUS
  Filled 2021-07-19: qty 2

## 2021-07-19 MED ORDER — ORAL CARE MOUTH RINSE
15.0000 mL | Freq: Two times a day (BID) | OROMUCOSAL | Status: DC
  Administered 2021-07-19 – 2021-07-28 (×15): 15 mL via OROMUCOSAL

## 2021-07-19 MED ORDER — IPRATROPIUM-ALBUTEROL 0.5-2.5 (3) MG/3ML IN SOLN
3.0000 mL | Freq: Four times a day (QID) | RESPIRATORY_TRACT | Status: DC | PRN
  Administered 2021-07-21: 3 mL via RESPIRATORY_TRACT
  Filled 2021-07-19: qty 3

## 2021-07-19 MED ORDER — SODIUM CHLORIDE 0.9% FLUSH
3.0000 mL | Freq: Two times a day (BID) | INTRAVENOUS | Status: DC
  Administered 2021-07-19 – 2021-07-28 (×17): 3 mL via INTRAVENOUS

## 2021-07-19 NOTE — Assessment & Plan Note (Addendum)
-   Checking Depakote level, normal - On Depakote ER 1000 mg daily at home - resumed depakote; elevated ammonia level can be associated with use

## 2021-07-19 NOTE — Assessment & Plan Note (Addendum)
-   continue tele  - ICD showed normal function on last check 05/10/21, due next in mid Oct

## 2021-07-19 NOTE — Assessment & Plan Note (Addendum)
-   Last echo 07/05/2020: EF 40 to 45%, undetermined diastolic function - Check BNP = 3772 - CXR relatively clear; no significant volume overload appreciated; does have R>L LE edema (~2+) - continue losartan/hctz

## 2021-07-19 NOTE — ED Notes (Signed)
Patient transported to CT 

## 2021-07-19 NOTE — Assessment & Plan Note (Addendum)
-   Awake and alert this morning - Resume amiodarone and Eliquis

## 2021-07-19 NOTE — Assessment & Plan Note (Addendum)
-   lives with brother; no noted seizure activity; patient collapsed when walking to the car with brother on day of admission - similar hospitalization noted 06/2020 and attributed to COPD exac/CHF exac - initial ABG: 7.135/112/247  -Treated with BiPAP on admission, able to be weaned off on 07/20/2021 - confirmed DNR/DNI status with brother on admission; BiPAP use okay PRN - CT head: Atrophy, chronic microvascular disease -Valproic acid level, 58 (normal)

## 2021-07-19 NOTE — Assessment & Plan Note (Addendum)
-   NH3 60 on admission; no known liver disease - etiology considered due to depakote use; elevation is benign and expected

## 2021-07-19 NOTE — ED Notes (Signed)
Critical Care MD at bedside.  

## 2021-07-19 NOTE — ED Notes (Signed)
Brother at bedside 

## 2021-07-19 NOTE — Hospital Course (Addendum)
[name] is a 80 yo male with PMH PAF, sCHF, HCM and NSVT s/p ICD, complete heart block, COPD, HTN.  Follows outpatient with cardiology, last seen 02/28/2021. Patient was unable to provide any collateral information on admission due to unresponsive state.  His brother was called on admission and provided HPI.  He states that the patient has been becoming more slow with his mentation over the past several weeks and has had difficulty ambulating and has been stumbling/falling.  Due to no improvement, he was going to bring him in for further evaluation when he collapsed and became unresponsive on the way to the car today. He denied seeing any seizure-like activity.  On arrival to the ER the patient was found to be hypercarbic with PCO2 112.  He was placed on BiPAP.  The patient's brother also confirmed code status of DNR/DNI but wished for treatment otherwise.   After admission, he showed improvement in mentation on BiPAP and was able to be weaned off on 07/20/2021.

## 2021-07-19 NOTE — Assessment & Plan Note (Addendum)
-   Follows with cardiology - Medtronic ICD in place.  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

## 2021-07-19 NOTE — ED Notes (Signed)
Dr. Matilde Sprang asked to come to bedside by writer of this note when pt arrived to room due to AMS and respiratory status. RT called to bring BiPap to pt room.

## 2021-07-19 NOTE — H&P (Signed)
History and Physical    Mike Gray  CVU:[id number]  DOB: Feb 27, 1941  DOA: 07/19/2021  PCP: [name], MD Patient coming from: home  Chief Complaint: unresponsive   HPI:  Mike Gray is a 80 yo male with PMH PAF, sCHF, HCM and NSVT s/p ICD, complete heart block, COPD, HTN.  Follows outpatient with cardiology, last seen 02/28/2021. Patient was unable to provide any collateral information on admission due to unresponsive state.  His brother was called on admission and provided HPI.  He states that the patient has been becoming more slow with his mentation over the past several weeks and has had difficulty ambulating and has been stumbling/falling.  Due to no improvement, he was going to bring him in for further evaluation when he collapsed and became unresponsive on the way to the car today. He denied seeing any seizure-like activity.  On arrival to the ER the patient was found to be hypercarbic with PCO2 112.  He was placed on BiPAP.  The patient's brother also confirmed code status of DNR/DNI but wished for treatment otherwise.   I have personally briefly reviewed patient's old medical records in [facility] and discussed patient with the ER provider when appropriate/indicated.  Assessment/Plan: * Respiratory failure with hypercapnia (HCC) - lives with brother; no noted seizure activity; patient collapsed when walking to the car with brother on day of admission - similar hospitalization noted 06/2020 and attributed to COPD exac/CHF exac - initial ABG: 7.135/112/247  - continue BiPAP - NH3 elevated 60, unclear etiology; trial of lactulose enema - confirmed DNR/DNI status with brother on admission  - follow up CT head - check valproic level although brother states no seizure hx (on it for bipolar)  Chronic systolic CHF (congestive heart failure) ([place]) - Last echo 07/05/2020: EF 40 to 45%, undetermined diastolic function - Check BNP - CXR relatively clear; no significant  volume overload appreciated; does have R>L LE edema (~2+) - hold off on lasix for now pending response to steroids   COPD with acute exacerbation ([place]) - continue BiPAP - will empirically treat for exacerbation; continue steroids   Acute renal failure superimposed on stage 3a chronic kidney disease ([place]) - patient has history of CKD3a. Baseline creat ~ 1.2, eGFR 53 - patient presents with increase in creat >0.3 mg/dL above baseline, creat increase >1.5x baseline presumed to have occurred within past 7 days PTA - creatinine 1.63 on admission  - differential includes prerenal from poor intake vs cardiorenal vs ATN from combo - not on diuretics at home; does have LE pitting edema but no vascular congestion on CXR - trend renal function for now - insert foley to rule out obstruction and due to unresponsive state   Increased ammonia level - NH3 60 on admission; no known liver disease - unclear etiology at this time - AST very mild elevation, otherwise normal LFTs - trial of lactulose enema  PAF (paroxysmal atrial fibrillation) (HCC) - Holding amiodarone and Eliquis for now while n.p.o. - If prolonged n.p.o. then will initiate IV amiodarone - Continue holding Eliquis.  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 - On Toprol at home - patient is DNR/DNI, confirmed with his brother on admission   Bipolar disorder [facility]) - Checking  Depakote level - On Depakote ER 1000 mg daily at home - If prolonged n.p.o., will transition to IV equivalent     Code Status: DNR    DVT Prophylaxis: Lovenox   Anticipated disposition is to: pending clinical course   History: Past Medical History:  Diagnosis Date   Angiodysplasia    Atrial  fibrillation (HCC)    CHF (congestive heart failure) (HCC)    COPD (chronic obstructive pulmonary disease) ([place])    GI bleed    Hypertension     Past Surgical History:  Procedure Laterality Date   COLONOSCOPY     PACEMAKER IMPLANT       reports that he quit smoking about 9 years ago. His smoking use included cigarettes. He has never used smokeless tobacco. He reports that he does not currently use alcohol. He reports that he does not use drugs.  No Known Allergies  Family History  Problem Relation Age of Onset   Healthy Mother    Breast cancer Father 71   Cancer Other    Home Medications: Prior to Admission medications   Medication Sig Start Date End Date Taking? Authorizing Provider  amiodarone (PACERONE) 200 MG tablet Take 1/2 (one-half) tablet by mouth once daily Patient taking differently: Take 100 mg by mouth daily. 02/25/21   Camnitz, Ocie Doyne, MD  apixaban (ELIQUIS) 5 MG TABS tablet Take 1 tablet (5 mg total) by mouth 2 (two) times daily. 07/11/21   Camnitz, Ocie Doyne, MD  atorvastatin (LIPITOR) 20 MG tablet Take 20 mg by mouth daily.    [provider]  azelastine (ASTELIN) 0.1 % nasal spray Place 1 spray into both nostrils 2 (two) times daily. 01/07/21   [provider]  divalproex (DEPAKOTE ER) 500 MG 24 hr tablet Take 1,000 mg by mouth daily.  10/20/19   [provider]  finasteride (PROSCAR) 5 MG tablet Take 5 mg by mouth daily. 07/15/20   [provider]  ipratropium (ATROVENT) 0.06 % nasal spray Place 2 sprays into both nostrils 3 (three) times daily. 03/08/21   [provider]  losartan-hydrochlorothiazide Konrad Penta) 100-25 MG tablet Take 1 tablet by mouth once daily 02/10/21   Camnitz, Ocie Doyne, MD  metoprolol (TOPROL-XL) 200 MG 24 hr tablet TAKE 1 TABLET BY MOUTH ONCE DAILY WITH A MEAL OR  IMMEDIATELY  FOLLOWING  A  MEAL  (DOSE  INCREASE) Patient taking differently: Take 200 mg by mouth daily. 06/10/21   Camnitz, Ocie Doyne, MD  oxybutynin (DITROPAN-XL) 10 MG 24 hr tablet Take 10 mg by mouth daily. 07/15/20   [provider]  SYMBICORT 160-4.5 MCG/ACT inhaler Inhale 2 puffs into the lungs in the morning and at bedtime. 05/27/21   [provider]  tamsulosin (FLOMAX) 0.4 MG CAPS capsule Take 1 capsule (0.4 mg total) by mouth daily after supper. Patient taking differently: Take 0.8 mg by mouth daily after supper. 07/09/20   Patrecia Pour, MD    Review of Systems:  Review of systems not obtained due to patient factors. Unresponsive   Physical Exam: Vitals:   07/19/21 1345 07/19/21 1400 07/19/21 1415 07/19/21 1430  BP: (!) 103/55 (!) 100/54 125/62 137/79  Pulse: (!) 59 60 65 62  Resp: 16 14 14 16   Temp:      TempSrc:      SpO2: 100% 100% 100% 100%   General appearance:  unresponsive, BIPAP in place; minor grimace to sternal rub Head: Normocephalic, without obvious abnormality, atraumatic Eyes:  1 mm  minimally reactive to light Lungs: clear to auscultation bilaterally Heart: regular rate and rhythm and S1, S2 normal Abdomen: normal findings: bowel sounds normal and soft, non-tender Extremities:  2+ R>L LE pitting edema Skin: mobility and turgor normal Neurologic: no withdraw to painful stimuli in any extremity   Labs on Admission:  I have personally reviewed following labs and imaging studies Results for orders placed or performed during the hospital encounter of 07/19/21 (from the past 24 hour(s))  Blood gas, arterial (at [facility])     Status: Abnormal   Collection Time: 07/19/21  1:00 PM  Result Value Ref Range   FIO2 100.00    pH, Arterial 7.135 (LL) 7.350 - 7.450   pCO2 arterial 112 (HH) 32.0 - 48.0 mmHg   pO2, Arterial 247 (H) 83.0 - 108.0 mmHg   Bicarbonate 36.1 (H) 20.0 - 28.0 mmol/L   Acid-Base Excess 3.3 (H) 0.0 - 2.0 mmol/L   O2 Saturation 99.2 %   Patient temperature 98.6    Allens test (pass/fail) PASS PASS  CBC with Differential     Status: Abnormal   Collection  Time: 07/19/21  1:02 PM  Result Value Ref Range   WBC 10.1 4.0 - 10.5 K/uL   RBC 4.75 4.22 - 5.81 MIL/uL   Hemoglobin 13.2 13.0 - 17.0 g/dL   HCT 42.2 39.0 - 52.0 %   MCV 88.8 80.0 - 100.0 fL   MCH 27.8 26.0 - 34.0 pg   MCHC 31.3 30.0 - 36.0 g/dL   RDW 15.9 (H) 11.5 - 15.5 %   Platelets 279 150 - 400 K/uL   nRBC 0.0 0.0 - 0.2 %   Neutrophils Relative % 75 %   Neutro Abs 7.5 1.7 - 7.7 K/uL   Lymphocytes Relative 14 %   Lymphs Abs 1.4 0.7 - 4.0 K/uL   Monocytes Relative 11 %   Monocytes Absolute 1.1 (H) 0.1 - 1.0 K/uL   Eosinophils Relative 0 %   Eosinophils Absolute 0.0 0.0 - 0.5 K/uL   Basophils Relative 0 %   Basophils Absolute 0.0 0.0 - 0.1 K/uL   Immature Granulocytes 0 %   Abs Immature Granulocytes 0.04 0.00 - 0.07 K/uL  Lactic acid, plasma     Status: Abnormal   Collection Time: 07/19/21  1:02 PM  Result Value Ref Range   Lactic Acid, Venous 2.3 (HH) 0.5 - 1.9 mmol/L  Comprehensive metabolic panel     Status: Abnormal   Collection Time: 07/19/21  1:02 PM  Result Value Ref Range   Sodium 130 (L) 135 - 145 mmol/L   Potassium 5.0 3.5 - 5.1 mmol/L   Chloride 83 (L) 98 - 111 mmol/L   CO2 32 22 - 32 mmol/L   Glucose, Bld 106 (H) 70 - 99 mg/dL   BUN 51 (H) 8 - 23 mg/dL   Creatinine, Ser 1.63 (H) 0.61 - 1.24 mg/dL   Calcium 10.1 8.9 - 10.3 mg/dL   Total Protein 7.5 6.5 - 8.1 g/dL   Albumin 4.4 3.5 - 5.0 g/dL   AST 43 (H) 15 - 41 U/L   ALT 44 0 - 44 U/L   Alkaline Phosphatase 60 38 - 126 U/L   Total Bilirubin 0.8 0.3 - 1.2 mg/dL   GFR, Estimated 43 (L) >60 mL/min   Anion gap 15 5 - 15  Protime-INR     Status: Abnormal   Collection Time: 07/19/21  1:02 PM  Result Value Ref Range   Prothrombin Time 18.6 (H)  11.4 - 15.2 seconds   INR 1.6 (H) 0.8 - 1.2  APTT     Status: None   Collection Time: 07/19/21  1:02 PM  Result Value Ref Range   aPTT 34 24 - 36 seconds  Ammonia     Status: Abnormal   Collection Time: 07/19/21  1:02 PM  Result Value Ref Range   Ammonia 60 (H)  9 - 35 umol/L  I-stat chem 8, ED (not at [facility] or [facility])     Status: Abnormal   Collection Time: 07/19/21  1:09 PM  Result Value Ref Range   Sodium 128 (L) 135 - 145 mmol/L   Potassium 4.8 3.5 - 5.1 mmol/L   Chloride 90 (L) 98 - 111 mmol/L   BUN 48 (H) 8 - 23 mg/dL   Creatinine, Ser 1.70 (H) 0.61 - 1.24 mg/dL   Glucose, Bld 103 (H) 70 - 99 mg/dL   Calcium, Ion 1.35 1.15 - 1.40 mmol/L   TCO2 37 (H) 22 - 32 mmol/L   Hemoglobin 15.0 13.0 - 17.0 g/dL   HCT 44.0 39.0 - 52.0 %     Radiological Exams on Admission: DG Chest Portable 1 View  Result Date: 07/19/2021 CLINICAL DATA:  COPD.  Dyspnea.  CHF. EXAM: PORTABLE CHEST 1 VIEW COMPARISON:  05/30/2021 FINDINGS: Pacer/AICD device. Midline trachea. Moderate cardiomegaly. Atherosclerosis in the transverse aorta. No pleural effusion or pneumothorax. No congestive failure. Clear lungs. IMPRESSION: Cardiomegaly, without congestive failure or acute disease. Aortic Atherosclerosis (ICD10-I70.0). Electronically Signed   By: Abigail Miyamoto M.D.   On: 07/19/2021 13:49   DG Chest Portable 1 View  Final Result    CT HEAD WO CONTRAST (5MM)    (Results Pending)    Consults called:     EKG: Independently reviewed. Sinus; LVH by voltage; RBBB with prolonged QTc   Dwyane Dee, MD [facility] 07/19/2021, 2:50 PM

## 2021-07-19 NOTE — ED Notes (Signed)
Admitting MD at bedside.

## 2021-07-19 NOTE — Assessment & Plan Note (Addendum)
-   s/p BiPAP - will empirically treat for exacerbation; continue steroids  - CXR repeated on 9/29; worsening opacities appreciated in RLL; continue Unasyn - no further fevers past 24 hours  - PCT negative x 3 - very little motivation; needs to get OOB and use IS

## 2021-07-19 NOTE — Progress Notes (Signed)
Patient came in the ER unresponsive. Physician stated that the patient status is DNR. Patient was placed on BIPAP, ABG drawn , and cont. Neb treatment given in line. Patient tolerating well at this time with Diminshed breaths sounds on BIPAP 16/8 100% Rt will continue to monitor

## 2021-07-19 NOTE — ED Notes (Signed)
Critical Lab Value: Lactic Acid: 2.3  Dr. Matilde Sprang made aware.

## 2021-07-19 NOTE — ED Notes (Signed)
Critical Lab Value: pH 7.135 pCO2 112   Dr. Matilde Sprang made aware.

## 2021-07-19 NOTE — ED Provider Notes (Signed)
[facility] DEPT Provider Note   CSN: [id number] Arrival date & time: 07/19/21  1233     History Chief Complaint  Patient presents with   Altered Mental Status   Respiratory Distress    Mike Gray is a 80 y.o. male with PMH A. fib, CHF, COPD, HTN, V. tach with AICD placement who presents emergency department for evaluation of altered mental status and respiratory distress.  History obtained from EMS who obtained history from patient's brother stating that over the last 2 weeks patient has had a steady decline in mental status with bizarre behaviors including withdrawing large sums of cash and misplacing them, intermittent somnolence.  Today, the patient collapsed in the driveway prompting call to EMS.  Patient 82% on room air on initial presentation and he was given 1 DuoNeb, 125 Solu-Medrol and brought to the emergency department for evaluation.  On initial presentation, the patient is somnolent but will awaken to loud voice.  He will not follow commands.  He has significant increased work of breathing with accessory muscle use and appears encephalopathic.  Additional history unable to be obtained.   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 ([place]) 07/19/2021   Paroxysmal ventricular tachycardia ([place]) 03/22/2021   Left ear impacted cerumen 03/08/2021   Benign prostatic hyperplasia with lower urinary tract symptoms 12/07/2020   Hearing loss 12/07/2020   Hypertensive heart failure ([place])  12/07/2020   Pure hypercholesterolemia 12/07/2020   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 ([place]) 07/05/2020   COPD with acute exacerbation ([place]) 07/05/2020   Bipolar disorder ([place]) 07/05/2020   Unspecified atrial fibrillation ([place]) 44/81/8563   Chronic systolic CHF (congestive heart failure) ([place]) 07/05/2020   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   History of complete heart block 07/05/2020   ICD (implantable cardioverter-defibrillator) in place 14/97/0263   Acute metabolic encephalopathy 78/58/8502    Past Surgical History:  Procedure Laterality Date   COLONOSCOPY     PACEMAKER IMPLANT         Family History  Problem Relation Age of Onset   Healthy Mother    Breast cancer Father 61   Cancer Other     Social History   Tobacco Use   Smoking status: Former    Types: Cigarettes    Quit date: 2013    Years since quitting: 9.7   Smokeless tobacco: Never  Vaping Use   Vaping Use: Never used  Substance Use Topics   Alcohol use: Not Currently   Drug use: Never    Home Medications Prior to Admission medications   Medication Sig Start Date End Date Taking? Authorizing Provider  amiodarone (PACERONE) 200 MG tablet Take 1/2 (one-half) tablet by mouth once daily Patient taking differently: Take 100 mg by mouth daily. 02/25/21   Camnitz, Ocie Doyne, MD  apixaban (ELIQUIS) 5 MG TABS tablet Take 1 tablet (5 mg total) by mouth 2 (two) times daily. 07/11/21   Camnitz, Ocie Doyne, MD  atorvastatin (  LIPITOR) 20 MG tablet Take 20 mg by mouth daily.    [provider]  azelastine (ASTELIN) 0.1 % nasal spray Place 1 spray into both nostrils 2 (two) times daily. 01/07/21   [provider]  divalproex (DEPAKOTE ER) 500 MG 24 hr tablet Take 1,000 mg by mouth daily.  10/20/19   [provider]  finasteride (PROSCAR) 5 MG tablet Take 5 mg by mouth daily. 07/15/20   [provider]  ipratropium (ATROVENT)  0.06 % nasal spray Place 2 sprays into both nostrils 3 (three) times daily. 03/08/21   [provider]  losartan-hydrochlorothiazide Konrad Penta) 100-25 MG tablet Take 1 tablet by mouth once daily 02/10/21   Camnitz, Ocie Doyne, MD  metoprolol (TOPROL-XL) 200 MG 24 hr tablet TAKE 1 TABLET BY MOUTH ONCE DAILY WITH A MEAL OR  IMMEDIATELY  FOLLOWING  A  MEAL  (DOSE  INCREASE) Patient taking differently: Take 200 mg by mouth daily. 06/10/21   Camnitz, Ocie Doyne, MD  oxybutynin (DITROPAN-XL) 10 MG 24 hr tablet Take 10 mg by mouth daily. 07/15/20   [provider]  SYMBICORT 160-4.5 MCG/ACT inhaler Inhale 2 puffs into the lungs in the morning and at bedtime. 05/27/21   [provider]  tamsulosin (FLOMAX) 0.4 MG CAPS capsule Take 1 capsule (0.4 mg total) by mouth daily after supper. Patient taking differently: Take 0.8 mg by mouth daily after supper. 07/09/20   Patrecia Pour, MD    Allergies    Patient has no known allergies.  Review of Systems   Review of Systems  Unable to perform ROS: Severe respiratory distress   Physical Exam Updated Vital Signs BP (!) 159/73 (BP Location: Right Arm)   Pulse (!) 59   Temp (!) 96.9 F (36.1 C) (Axillary)   Resp 16   SpO2 100%   Physical Exam Vitals and nursing note reviewed.  Constitutional:      General: He is in acute distress.     Appearance: He is well-developed. He is ill-appearing and toxic-appearing.  HENT:     Head: Normocephalic and atraumatic.  Eyes:     Conjunctiva/sclera: Conjunctivae normal.  Cardiovascular:     Rate and Rhythm: Normal rate and regular rhythm.     Heart sounds: No murmur heard. Pulmonary:     Effort: Respiratory distress present.     Breath sounds: Wheezing present.     Comments: Overall decreased breath sounds, mild end expiratory wheezing Abdominal:     Palpations: Abdomen is soft.     Tenderness: There is no abdominal tenderness.  Musculoskeletal:     Cervical back: Neck supple.  Skin:     General: Skin is warm and dry.  Neurological:     Mental Status: He is alert. He is disoriented.    ED Results / Procedures / Treatments   Labs (all labs ordered are listed, but only abnormal results are displayed) Labs Reviewed  CBC WITH DIFFERENTIAL/PLATELET - Abnormal; Notable for the following components:      Result Value   RDW 15.9 (*)    Monocytes Absolute 1.1 (*)    All other components within normal limits  BLOOD GAS, ARTERIAL - Abnormal; Notable for the following components:   pH, Arterial 7.135 (*)    pCO2 arterial 112 (*)    pO2, Arterial 247 (*)    Bicarbonate 36.1 (*)    Acid-Base Excess 3.3 (*)    All other components within normal limits  LACTIC ACID, PLASMA - Abnormal; Notable  for the following components:   Lactic Acid, Venous 2.3 (*)    All other components within normal limits  COMPREHENSIVE METABOLIC PANEL - Abnormal; Notable for the following components:   Sodium 130 (*)    Chloride 83 (*)    Glucose, Bld 106 (*)    BUN 51 (*)    Creatinine, Ser 1.63 (*)    AST 43 (*)    GFR, Estimated 43 (*)    All other components within normal limits  PROTIME-INR - Abnormal; Notable for the following components:   Prothrombin Time 18.6 (*)    INR 1.6 (*)    All other components within normal limits  URINALYSIS, ROUTINE W REFLEX MICROSCOPIC - Abnormal; Notable for the following components:   Bacteria, UA RARE (*)    All other components within normal limits  AMMONIA - Abnormal; Notable for the following components:   Ammonia 60 (*)    All other components within normal limits  BRAIN NATRIURETIC PEPTIDE - Abnormal; Notable for the following components:   B Natriuretic Peptide 3,772.3 (*)    All other components within normal limits  I-STAT CHEM 8, ED - Abnormal; Notable for the following components:   Sodium 128 (*)    Chloride 90 (*)    BUN 48 (*)    Creatinine, Ser 1.70 (*)    Glucose, Bld 103 (*)    TCO2 37 (*)    All other components within normal  limits  CULTURE, BLOOD (SINGLE)  URINE CULTURE  MRSA NEXT GEN BY PCR, NASAL  LACTIC ACID, PLASMA  APTT  VALPROIC ACID LEVEL    EKG EKG Interpretation  Date/Time:  Tuesday July 19 2021 12:59:36 EDT Ventricular Rate:  61 PR Interval:  158 QRS Duration: 212 QT Interval:  522 QTC Calculation: 526 R Axis:   262 Text Interpretation: A-paced rhythm No ischemic change Confirmed by [place] ([id number]) on 07/19/2021 5:40:52 PM  Radiology CT HEAD WO CONTRAST (5MM)  Result Date: 07/19/2021 CLINICAL DATA:  Mental status changes. EXAM: CT HEAD WITHOUT CONTRAST TECHNIQUE: Contiguous axial images were obtained from the base of the skull through the vertex without intravenous contrast. COMPARISON:  07/05/2020 FINDINGS: Brain: There is atrophy and chronic small vessel disease changes. No acute intracranial abnormality. Specifically, no hemorrhage, hydrocephalus, mass lesion, acute infarction, or significant intracranial injury. Vascular: No hyperdense vessel or unexpected calcification. Skull: No acute calvarial abnormality. Sinuses/Orbits: No acute findings Other: None IMPRESSION: Atrophy, chronic microvascular disease. No acute intracranial abnormality. Electronically Signed   By: Rolm Baptise M.D.   On: 07/19/2021 15:25   DG Chest Portable 1 View  Result Date: 07/19/2021 CLINICAL DATA:  COPD.  Dyspnea.  CHF. EXAM: PORTABLE CHEST 1 VIEW COMPARISON:  05/30/2021 FINDINGS: Pacer/AICD device. Midline trachea. Moderate cardiomegaly. Atherosclerosis in the transverse aorta. No pleural effusion or pneumothorax. No congestive failure. Clear lungs. IMPRESSION: Cardiomegaly, without congestive failure or acute disease. Aortic Atherosclerosis (ICD10-I70.0). Electronically Signed   By: Abigail Miyamoto M.D.   On: 07/19/2021 13:49    Procedures .Critical Care Performed by: Teressa Lower, MD Authorized by: Teressa Lower, MD   Critical care provider statement:    Critical care time (minutes):  45    Critical care was necessary to treat or prevent imminent or life-threatening deterioration of the following conditions:  Respiratory failure   Critical care was time spent personally by me on the following activities:  Discussions with consultants, evaluation of patient's response to treatment, examination of patient, ordering and performing treatments and interventions,  ordering and review of laboratory studies, ordering and review of radiographic studies, pulse oximetry, re-evaluation of patient's condition, obtaining history from patient or surrogate and review of old charts   Medications Ordered in ED Medications  lactulose (CHRONULAC) enema 200 gm (has no administration in time range)  enoxaparin (LOVENOX) injection 40 mg (has no administration in time range)  sodium chloride flush (NS) 0.9 % injection 3 mL (has no administration in time range)  methylPREDNISolone sodium succinate (SOLU-MEDROL) 125 mg/2 mL injection 125 mg (has no administration in time range)  methylPREDNISolone sodium succinate (SOLU-MEDROL) 40 mg/mL injection 40 mg (has no administration in time range)  ipratropium-albuterol (DUONEB) 0.5-2.5 (3) MG/3ML nebulizer solution 3 mL (has no administration in time range)  arformoterol (BROVANA) nebulizer solution 15 mcg (has no administration in time range)  budesonide (PULMICORT) nebulizer solution 0.5 mg (has no administration in time range)  chlorhexidine (PERIDEX) 0.12 % solution 15 mL (has no administration in time range)  MEDLINE mouth rinse (has no administration in time range)  Chlorhexidine Gluconate Cloth 2 % PADS 6 each (6 each Topical Given 07/19/21 1708)  ipratropium-albuterol (DUONEB) 0.5-2.5 (3) MG/3ML nebulizer solution 6 mL (6 mLs Nebulization Given 07/19/21 1309)    ED Course  I have reviewed the triage vital signs and the nursing notes.  Pertinent labs & imaging results that were available during my care of the patient were reviewed by me and considered in my  medical decision making (see chart for details).    MDM Rules/Calculators/A&P                           Patient seen in the emergency department for evaluation of respiratory stress and altered mental status.  Physical exam reveals an ill-appearing toxic patient with respiratory distress and accessory muscle usage.  He will not respond to commands but will awaken to loud voice.  I spoke with the patient's brother and confirmed that the patient is a DO NOT RESUSCITATE, DO NOT INTUBATE and he states that the patient's and him are fully aware that if this action is not pursued the patient may pass away.  Patient then placed on BiPAP and initial ABG with pH of 7.13, PCO2 112, PO2 247.  Additional laboratory evaluation with a hyponatremia to 130, hypochloremia to 83, creatinine elevation to 1.63, BUN 51, AST 43, INR elevated to 1.6, BNP elevated at 3772, urinalysis unremarkable, ammonia elevated to 60.  CT head unremarkable, CHF with cardiomegaly but no evidence of pulmonary edema.  Patient will be admitted to the ICU in critical condition on BiPAP.  Final Clinical Impression(s) / ED Diagnoses Final diagnoses:  None    Rx / DC Orders ED Discharge Orders     None        Elim Peale, Debe Coder, MD 07/19/21 1745

## 2021-07-19 NOTE — Consult Note (Addendum)
NAME:  Mike Gray, MRN:  [id number], DOB:  [birth year], LOS: 0 ADMISSION DATE:  07/19/2021, CONSULTATION DATE:  9/27 REFERRING MD:  Darrell Jewel, CHIEF COMPLAINT:  Unresponsive   History of Present Illness:  This is a 80 year old male with a past medical history significant for COPD who presented to the [facility] emergency department unresponsive.  He was found to have severe hypercapnic respiratory failure so noninvasive mechanical ventilation was started and pulmonary and critical care medicine was consulted for further evaluation.  The patient's brother provides the history because the patient is obtunded and unable to answer questions at this time.  He states that over the last 2 weeks his brother has become progressively more weak and lethargic.  He says that at baseline he is always slow to respond to questions (he attributes this to the Depakote and other psychiatric medicines that he takes) but in the last 2 weeks things have been much worse.  He has been drowsy and falling asleep.  He also complains of shortness of breath.  He says that his brother uses nebulized medicines at home but he is never used BiPAP and he has never seen a pulmonologist.  He says that he continues to smoke cigarettes daily.  Today he said that his brother was very lethargic and he went out briefly to run some errands.  When he returned he found him near unresponsive.  He was able to get up and walk with assistance to the garage but then he could walk no further and collapsed onto his knees so his brother (primary caregiver) called [phone].  Pertinent  Medical History  Atrial fibrillation Congestive heart failure COPD Hypertension  Significant Hospital Events: Including procedures, antibiotic start and stop dates in addition to other pertinent events   9/27 admission  Interim History / Subjective:  As above  Objective   Blood pressure 122/65, pulse 61, temperature (!) 97.5 F (36.4 C), temperature source Axillary,  resp. rate 14, SpO2 100 %.    FiO2 (%):  [100 %] 100 %  No intake or output data in the 24 hours ending 07/19/21 1540 There were no vitals filed for this visit.  Examination:  General:  Lying in bed on NIMV HENT: NCAT OP clear PULM: CTA B, normal effort CV: RRR, no mgr GI: BS+, soft, nontender MSK: normal bulk and tone Neuro: asleep, will stir to painful stimuli but doesn't open eyes, no purposeful movements   Resolved Hospital Problem list     Assessment & Plan:  Acute respiratory failure with hypoxemia and hypercarbia: Presumably due to decompensated COPD with exacerbation but there may also be a component of untreated sleep apnea.  Currently his hypercarbia is so severe that it made him encephalopathic and he requires admission to the intensive care unit.  I do not believe that he is protecting his airway.  There is a very high risk of death from this illness.  [name] states clearly that he does not want to be intubated. Admit to ICU Noninvasive mechanical ventilation overnight No sedating medications Start Brovana and Pulmicort DuoNeb as needed Agree with Solu-Medrol as ordered Goal SaO2 88 to 92% If he survives needs outpatient pulmonary consultation and PFT  Acute metabolic encephalopathy secondary to severe hypercarbia: No sedating medications Treat hypercarbia with noninvasive mechanical ventilation  Discussed with brother.  If the patient's clinical status were to worsen and his hypercarbia does not improve or if he were to decline (bradycardia, shock, respiratory arrest) then he is a full DNR  and we should focus completely on his comfort rather than making resuscitative efforts.  Best Practice (right click and "Reselect all SmartList Selections" daily)   Per [facility]  Labs   CBC: Recent Labs  Lab 07/19/21 1302 07/19/21 1309  WBC 10.1  --   NEUTROABS 7.5  --   HGB 13.2 15.0  HCT 42.2 44.0  MCV 88.8  --   PLT 279  --     Basic Metabolic  Panel: Recent Labs  Lab 07/19/21 1302 07/19/21 1309  NA 130* 128*  K 5.0 4.8  CL 83* 90*  CO2 32  --   GLUCOSE 106* 103*  BUN 51* 48*  CREATININE 1.63* 1.70*  CALCIUM 10.1  --    GFR: CrCl cannot be calculated (Unknown ideal weight.). Recent Labs  Lab 07/19/21 1302  WBC 10.1  LATICACIDVEN 2.3*    Liver Function Tests: Recent Labs  Lab 07/19/21 1302  AST 43*  ALT 44  ALKPHOS 60  BILITOT 0.8  PROT 7.5  ALBUMIN 4.4   No results for input(s): LIPASE, AMYLASE in the last 168 hours. Recent Labs  Lab 07/19/21 1302  AMMONIA 60*    ABG    Component Value Date/Time   PHART 7.135 (LL) 07/19/2021 1300   PCO2ART 112 (HH) 07/19/2021 1300   PO2ART 247 (H) 07/19/2021 1300   HCO3 36.1 (H) 07/19/2021 1300   TCO2 37 (H) 07/19/2021 1309   O2SAT 99.2 07/19/2021 1300     Coagulation Profile: Recent Labs  Lab 07/19/21 1302  INR 1.6*    Cardiac Enzymes: No results for input(s): CKTOTAL, CKMB, CKMBINDEX, TROPONINI in the last 168 hours.  HbA1C: No results found for: HGBA1C  CBG: No results for input(s): GLUCAP in the last 168 hours.  Review of Systems:   Cannot obtain due to intubation  Past Medical History:  He,  has a past medical history of Angiodysplasia, Atrial fibrillation ([place]), CHF (congestive heart failure) ([place]), COPD (chronic obstructive pulmonary disease) ([place]), GI bleed, and Hypertension.   Surgical History:   Past Surgical History:  Procedure Laterality Date   COLONOSCOPY     PACEMAKER IMPLANT       Social History:   reports that he quit smoking about 9 years ago. His smoking use included cigarettes. He has never used smokeless tobacco. He reports that he does not currently use alcohol. He reports that he does not use drugs.   Family History:  His family history includes Breast cancer (age of onset: 58) in his father; Cancer in an other family member; Healthy in his mother.   Allergies No Known Allergies   Home Medications  Prior to  Admission medications   Medication Sig Start Date End Date Taking? Authorizing Provider  amiodarone (PACERONE) 200 MG tablet Take 1/2 (one-half) tablet by mouth once daily Patient taking differently: Take 100 mg by mouth daily. 02/25/21   Camnitz, Ocie Doyne, MD  apixaban (ELIQUIS) 5 MG TABS tablet Take 1 tablet (5 mg total) by mouth 2 (two) times daily. 07/11/21   Camnitz, Ocie Doyne, MD  atorvastatin (LIPITOR) 20 MG tablet Take 20 mg by mouth daily.    [provider]  azelastine (ASTELIN) 0.1 % nasal spray Place 1 spray into both nostrils 2 (two) times daily. 01/07/21   [provider]  divalproex (DEPAKOTE ER) 500 MG 24 hr tablet Take 1,000 mg by mouth daily.  10/20/19   [provider]  finasteride (PROSCAR) 5 MG tablet Take 5 mg by mouth daily. 07/15/20  [provider]  ipratropium (ATROVENT) 0.06 % nasal spray Place 2 sprays into both nostrils 3 (three) times daily. 03/08/21   [provider]  losartan-hydrochlorothiazide Konrad Penta) 100-25 MG tablet Take 1 tablet by mouth once daily 02/10/21   Camnitz, Ocie Doyne, MD  metoprolol (TOPROL-XL) 200 MG 24 hr tablet TAKE 1 TABLET BY MOUTH ONCE DAILY WITH A MEAL OR  IMMEDIATELY  FOLLOWING  A  MEAL  (DOSE  INCREASE) Patient taking differently: Take 200 mg by mouth daily. 06/10/21   Camnitz, Ocie Doyne, MD  oxybutynin (DITROPAN-XL) 10 MG 24 hr tablet Take 10 mg by mouth daily. 07/15/20   [provider]  SYMBICORT 160-4.5 MCG/ACT inhaler Inhale 2 puffs into the lungs in the morning and at bedtime. 05/27/21   [provider]  tamsulosin (FLOMAX) 0.4 MG CAPS capsule Take 1 capsule (0.4 mg total) by mouth daily after supper. Patient taking differently: Take 0.8 mg by mouth daily after supper. 07/09/20   Patrecia Pour, MD     Critical care time: 35 minutes    Roselie Awkward, MD [facility] Pager: [phone] Cell: [phone] After 7:00 pm call [place]  [phone]

## 2021-07-19 NOTE — Assessment & Plan Note (Addendum)
-  patient has history of CKD3a. Baseline creat ~ 1.2, eGFR 53 - patient presents with increase in creat >0.3 mg/dL above baseline, creat increase >1.5x baseline presumed to have occurred within past 7 days PTA - creatinine 1.63 on admission  - differential includes prerenal from poor intake vs cardiorenal vs ATN from combo - not on diuretics at home; does have LE pitting edema (patient states this is baseline) but no vascular congestion on CXR - trend renal function for now - now that awake/alert can remove foley

## 2021-07-19 NOTE — ED Notes (Signed)
Message sent to inpatient RN.

## 2021-07-19 NOTE — ED Triage Notes (Signed)
Pt BIB [facility] from home due to AMS and respiratory failure. Pt became lethargic with EMS on the way to ER. Pt received: 5mg  albuterol Neb 5mg  alb/0.5 mg atrovent neb 125mg  solumedrol 0.3 epi Im to Left Deltoid 2g mag IV  Pt was 82% on RA

## 2021-07-20 ENCOUNTER — Telehealth: Payer: Self-pay | Admitting: Nurse Practitioner

## 2021-07-20 DIAGNOSIS — J9601 Acute respiratory failure with hypoxia: Secondary | ICD-10-CM | POA: Diagnosis not present

## 2021-07-20 DIAGNOSIS — J9602 Acute respiratory failure with hypercapnia: Secondary | ICD-10-CM | POA: Diagnosis not present

## 2021-07-20 DIAGNOSIS — J441 Chronic obstructive pulmonary disease with (acute) exacerbation: Secondary | ICD-10-CM | POA: Diagnosis not present

## 2021-07-20 LAB — CBC WITH DIFFERENTIAL/PLATELET
Abs Immature Granulocytes: 0.02 10*3/uL (ref 0.00–0.07)
Basophils Absolute: 0 10*3/uL (ref 0.0–0.1)
Basophils Relative: 0 %
Eosinophils Absolute: 0 10*3/uL (ref 0.0–0.5)
Eosinophils Relative: 0 %
HCT: 37.7 % — ABNORMAL LOW (ref 39.0–52.0)
Hemoglobin: 12 g/dL — ABNORMAL LOW (ref 13.0–17.0)
Immature Granulocytes: 0 %
Lymphocytes Relative: 6 %
Lymphs Abs: 0.3 10*3/uL — ABNORMAL LOW (ref 0.7–4.0)
MCH: 27.9 pg (ref 26.0–34.0)
MCHC: 31.8 g/dL (ref 30.0–36.0)
MCV: 87.7 fL (ref 80.0–100.0)
Monocytes Absolute: 0.2 10*3/uL (ref 0.1–1.0)
Monocytes Relative: 4 %
Neutro Abs: 4.9 10*3/uL (ref 1.7–7.7)
Neutrophils Relative %: 90 %
Platelets: 211 10*3/uL (ref 150–400)
RBC: 4.3 MIL/uL (ref 4.22–5.81)
RDW: 15.8 % — ABNORMAL HIGH (ref 11.5–15.5)
WBC: 5.5 10*3/uL (ref 4.0–10.5)
nRBC: 0 % (ref 0.0–0.2)

## 2021-07-20 LAB — GLUCOSE, CAPILLARY
Glucose-Capillary: 124 mg/dL — ABNORMAL HIGH (ref 70–99)
Glucose-Capillary: 112 mg/dL — ABNORMAL HIGH (ref 70–99)
Glucose-Capillary: 113 mg/dL — ABNORMAL HIGH (ref 70–99)
Glucose-Capillary: 193 mg/dL — ABNORMAL HIGH (ref 70–99)
Glucose-Capillary: 152 mg/dL — ABNORMAL HIGH (ref 70–99)
Glucose-Capillary: 122 mg/dL — ABNORMAL HIGH (ref 70–99)

## 2021-07-20 LAB — BASIC METABOLIC PANEL
Anion gap: 13 (ref 5–15)
BUN: 53 mg/dL — ABNORMAL HIGH (ref 8–23)
CO2: 31 mmol/L (ref 22–32)
Calcium: 9.5 mg/dL (ref 8.9–10.3)
Chloride: 87 mmol/L — ABNORMAL LOW (ref 98–111)
Creatinine, Ser: 1.45 mg/dL — ABNORMAL HIGH (ref 0.61–1.24)
GFR, Estimated: 49 mL/min — ABNORMAL LOW (ref >60)
Glucose, Bld: 110 mg/dL — ABNORMAL HIGH (ref 70–99)
Potassium: 4.7 mmol/L (ref 3.5–5.1)
Sodium: 131 mmol/L — ABNORMAL LOW (ref 135–145)

## 2021-07-20 LAB — URINE CULTURE: Culture: NO GROWTH

## 2021-07-20 LAB — MAGNESIUM: Magnesium: 2 mg/dL (ref 1.7–2.4)

## 2021-07-20 LAB — AMMONIA: Ammonia: 53 umol/L — ABNORMAL HIGH (ref 9–35)

## 2021-07-20 MED ORDER — ATORVASTATIN CALCIUM 20 MG PO TABS
20.0000 mg | ORAL_TABLET | Freq: Every day | ORAL | Status: DC
  Administered 2021-07-20 – 2021-07-28 (×9): 20 mg via ORAL
  Filled 2021-07-20: qty 2
  Filled 2021-07-20 (×3): qty 1
  Filled 2021-07-20 (×4): qty 2
  Filled 2021-07-20: qty 1

## 2021-07-20 MED ORDER — FINASTERIDE 5 MG PO TABS
5.0000 mg | ORAL_TABLET | Freq: Every day | ORAL | Status: DC
  Administered 2021-07-20 – 2021-07-28 (×9): 5 mg via ORAL
  Filled 2021-07-20 (×9): qty 1

## 2021-07-20 MED ORDER — APIXABAN 5 MG PO TABS
5.0000 mg | ORAL_TABLET | Freq: Two times a day (BID) | ORAL | Status: DC
  Administered 2021-07-20 – 2021-07-28 (×17): 5 mg via ORAL
  Filled 2021-07-20 (×17): qty 1

## 2021-07-20 MED ORDER — HYDROCHLOROTHIAZIDE 25 MG PO TABS
25.0000 mg | ORAL_TABLET | Freq: Every day | ORAL | Status: DC
  Administered 2021-07-20 – 2021-07-25 (×6): 25 mg via ORAL
  Filled 2021-07-20 (×6): qty 1

## 2021-07-20 MED ORDER — LOSARTAN POTASSIUM 50 MG PO TABS
100.0000 mg | ORAL_TABLET | Freq: Every day | ORAL | Status: DC
  Administered 2021-07-20 – 2021-07-25 (×6): 100 mg via ORAL
  Filled 2021-07-20 (×6): qty 2

## 2021-07-20 MED ORDER — LABETALOL HCL 5 MG/ML IV SOLN
10.0000 mg | INTRAVENOUS | Status: DC | PRN
  Filled 2021-07-20: qty 4

## 2021-07-20 MED ORDER — LOSARTAN POTASSIUM-HCTZ 100-25 MG PO TABS: 1.0000 | ORAL_TABLET | Freq: Every day | ORAL | Status: DC

## 2021-07-20 MED ORDER — HYDRALAZINE HCL 25 MG PO TABS: 25.0000 mg | ORAL_TABLET | ORAL | Status: DC | PRN

## 2021-07-20 MED ORDER — AMIODARONE HCL 100 MG PO TABS
100.0000 mg | ORAL_TABLET | Freq: Every day | ORAL | Status: DC
  Administered 2021-07-20 – 2021-07-28 (×9): 100 mg via ORAL
  Filled 2021-07-20 (×9): qty 1

## 2021-07-20 MED ORDER — DIVALPROEX SODIUM ER 500 MG PO TB24
1000.0000 mg | ORAL_TABLET | Freq: Every day | ORAL | Status: DC
  Administered 2021-07-20 – 2021-07-28 (×9): 1000 mg via ORAL
  Filled 2021-07-20 (×5): qty 4
  Filled 2021-07-20 (×4): qty 2

## 2021-07-20 MED ORDER — TAMSULOSIN HCL 0.4 MG PO CAPS
0.4000 mg | ORAL_CAPSULE | Freq: Every day | ORAL | Status: DC
  Administered 2021-07-20 – 2021-07-27 (×8): 0.4 mg via ORAL
  Filled 2021-07-20 (×8): qty 1

## 2021-07-20 NOTE — Telephone Encounter (Signed)
Scheduled hospital f/u for patient with Dr. Silas Flood in [place] on 08/16/2021 at 9:30 am

## 2021-07-20 NOTE — TOC Initial Note (Signed)
Transition of Care [facility]) - Initial/Assessment Note    Patient Details  Name: Mike Gray MRN: [id number] Date of Birth: [birth year]  Transition of Care [facility]) CM/SW Contact:    Leeroy Cha, RN Phone Number: 07/20/2021, 7:42 AM  Clinical Narrative:                 80 year old male with a past medical history significant for COPD who presented to the [facility] emergency department unresponsive.  He was found to have severe hypercapnic respiratory failure so noninvasive mechanical ventilation was started and pulmonary and critical care medicine was consulted for further evaluation.  The patient's brother provides the history because the patient is obtunded and unable to answer questions at this time.  He states that over the last 2 weeks his brother has become progressively more weak and lethargic.  He says that at baseline he is always slow to respond to questions (he attributes this to the Depakote and other psychiatric medicines that he takes) but in the last 2 weeks things have been much worse.  He has been drowsy and falling asleep.  He also complains of shortness of breath.  He says that his brother uses nebulized medicines at home but he is never used BiPAP and he has never seen a pulmonologist.  He says that he continues to smoke cigarettes daily.  Today he said that his brother was very lethargic and he went out briefly to run some errands.  When he returned he found him near unresponsive.  He was able to get up and walk with assistance to the garage but then he could walk no further and collapsed onto his knees so his brother (primary caregiver) called 911.   Pertinent  Medical History  Atrial fibrillation Congestive heart failure COPD Hypertension   Significant Hospital Events: Including procedures, antibiotic start and stop dates in addition to other pertinent events   9/27 admission  TOC PLAN OF CARE:  Following for toc needs possible hhc and dme o2. Following for  progression  Expected Discharge Plan: Home/Self Care Barriers to Discharge: Continued Medical Work up   Patient Goals and CMS Choice Patient states their goals for this hospitalization and ongoing recovery are:: unable to state at this time CMS Medicare.gov Compare Post Acute Care list provided to:: Patient    Expected Discharge Plan and Services Expected Discharge Plan: Home/Self Care   Discharge Planning Services: CM Consult   Living arrangements for the past 2 months: Single Family Home                                      Prior Living Arrangements/Services Living arrangements for the past 2 months: Single Family Home Lives with:: Self (has brother for support) Patient language and need for interpreter reviewed:: Yes Do you feel safe going back to the place where you live?: Yes            Criminal Activity/Legal Involvement Pertinent to Current Situation/Hospitalization: No - Comment as needed  Activities of Daily Living Home Assistive Devices/Equipment: Dentures (specify type) (upper/lower dentures) ADL Screening (condition at time of admission) Patient's cognitive ability adequate to safely complete daily activities?: No (Lethargic, AMS on Bipap) Is the patient deaf or have difficulty hearing?: Yes (skight hoh) Does the patient have difficulty seeing, even when wearing glasses/contacts?: No Does the patient have difficulty concentrating, remembering, or making decisions?: Yes (Lethargic, AMS on Bipap) Patient  able to express need for assistance with ADLs?: No (Lethargic, AMS on Bipap) Does the patient have difficulty dressing or bathing?: Yes Independently performs ADLs?: No (Lethargic, AMS on Bipap) Communication: Needs assistance (Lethargic, AMS on Bipap) Is this a change from baseline?: Change from baseline, expected to last >3 days Dressing (OT): Dependent Is this a change from baseline?: Change from baseline, expected to last >3 days Grooming:  Dependent Is this a change from baseline?: Change from baseline, expected to last >3 days Feeding: Dependent Is this a change from baseline?: Change from baseline, expected to last >3 days Bathing: Dependent Is this a change from baseline?: Change from baseline, expected to last >3 days Toileting: Dependent Is this a change from baseline?: Change from baseline, expected to last >3days In/Out Bed: Dependent Is this a change from baseline?: Change from baseline, expected to last >3 days Walks in Home: Dependent Is this a change from baseline?: Change from baseline, expected to last >3 days Does the patient have difficulty walking or climbing stairs?: Yes (secondary to lethargy, AMS on Bipap) Weakness of Legs: Both Weakness of Arms/Hands: Both  Permission Sought/Granted                  Emotional Assessment Appearance:: Appears stated age     Orientation: : Oriented to Self, Oriented to Place, Oriented to  Time, Oriented to Situation Alcohol / Substance Use: Not Applicable Psych Involvement: No (comment)  Admission diagnosis:  Respiratory failure with hypercapnia (HCC) [J96.92] Patient Active Problem List   Diagnosis Date Noted   Respiratory failure with hypercapnia ([place]) 07/19/2021   PAF (paroxysmal atrial fibrillation) ([place]) 07/19/2021   Increased ammonia level 07/19/2021   Acute renal failure superimposed on stage 3a chronic kidney disease ([place]) 07/19/2021   Paroxysmal ventricular tachycardia (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 [place] [address] Phone: [phone] Fax: [phone]     Social Determinants of Health (SDOH) Interventions    Readmission Risk Interventions No flowsheet data found.

## 2021-07-20 NOTE — Progress Notes (Signed)
NAME:  Mike Gray, MRN:  [id number], DOB:  12-25-1940, LOS: 1 ADMISSION DATE:  07/19/2021, CONSULTATION DATE:  9/27 REFERRING MD:  Darrell Jewel, CHIEF COMPLAINT:  Unresponsive   History of Present Illness:  This is a 80 year old male with a past medical history significant for COPD who presented to the [facility] emergency department unresponsive.  He was found to have severe hypercapnic respiratory failure so noninvasive mechanical ventilation was started and pulmonary and critical care medicine was consulted for further evaluation.  The patient's brother provides the history because the patient is obtunded and unable to answer questions at this time.  He states that over the last 2 weeks his brother has become progressively more weak and lethargic.  He says that at baseline he is always slow to respond to questions (he attributes this to the Depakote and other psychiatric medicines that he takes) but in the last 2 weeks things have been much worse.  He has been drowsy and falling asleep.  He also complains of shortness of breath.  He says that his brother uses nebulized medicines at home but he is never used BiPAP and he has never seen a pulmonologist.  He says that he continues to smoke cigarettes daily.  Today he said that his brother was very lethargic and he went out briefly to run some errands.  When he returned he found him near unresponsive.  He was able to get up and walk with assistance to the garage but then he could walk no further and collapsed onto his knees so his brother (primary caregiver) called 911.  Pertinent  Medical History  Atrial fibrillation Congestive heart failure COPD Hypertension  Significant Hospital Events: Including procedures, antibiotic start and stop dates in addition to other pertinent events   9/27 admission  Interim History / Subjective:  Doing well, wants to take off bipap. SOB improved. Has had a runny nose recently and has been requiring his nebulizer more  frequently.  Continues to smoke.  Objective   Blood pressure (!) 131/56, pulse 73, temperature (!) 97.4 F (36.3 C), temperature source Axillary, resp. rate (!) 27, SpO2 98 %.    FiO2 (%):  [60 %] 60 %   Intake/Output Summary (Last 24 hours) at 07/20/2021 1933 Last data filed at 07/20/2021 1926 Gross per 24 hour  Intake 3 ml  Output 1425 ml  Net -1422 ml   There were no vitals filed for this visit.  Examination:  General: Elderly man sitting up in bed no acute distress HENT: [place]/AT, eyes anicteric PULM: Breathing comfortably on nasal cannula, no accessory muscle use.  Minimal wheezing, reduced breath sounds bilaterally CV: S1-S2, regular rate and rhythm GI: BS+, soft, nontender MSK: No significant peripheral edema, no cyanosis Neuro: Awake and alert, answering questions appropriately, moving all extremities spontaneously but globally weak   Resolved Hospital Problem list     Assessment & Plan:  Acute respiratory failure with hypoxemia and hypercarbia: Due to COPD with acute exacerbation, but there may also be a component of untreated sleep apnea.  His hypercarbia was so severe that it made him encephalopathic and he requires admission to the intensive care unit. [name] stated clearly that he does not want to be intubated. -Okay to take breaks from BiPAP.  Can do BiPAP as needed.  If he is getting more sleepy would recommend putting him back on. -Avoid potentially sedating medications - Continue Pulmicort, Brovana.  Needs triple inhaled therapy at discharge - DuoNebs as needed -Continue steroids -If  he is failing to resolve, would add azithromycin.  Since he is doing well since admission without starting this, will avoid starting it currently. -Wean supplemental oxygen to maintain SPO2 88 to 92%.  Avoid hyperoxia. -Needs outpatient pulmonary follow-up with PFTs -Needs to quit smoking  Ongoing tobacco abuse-1 pack/day - Strongly recommended smoking  cessation -Reports he does not need nicotine patch  Acute metabolic encephalopathy secondary to severe hypercarbia, resolved -Continue to monitor and placed back on BiPAP if needed  Goals of care discussed with brother at admission per Dr. Lake Bells.  If the patient's clinical status were to worsen and his hypercarbia does not improve or if he were to decline (bradycardia, shock, respiratory arrest) then he is a full DNR and we should focus completely on his comfort rather than making resuscitative efforts.  Best Practice (right click and "Reselect all SmartList Selections" daily)   Per TRH  Labs   CBC: Recent Labs  Lab 07/19/21 1302 07/19/21 1309 07/20/21 0238  WBC 10.1  --  5.5  NEUTROABS 7.5  --  4.9  HGB 13.2 15.0 12.0*  HCT 42.2 44.0 37.7*  MCV 88.8  --  87.7  PLT 279  --  211     Basic Metabolic Panel: Recent Labs  Lab 07/19/21 1302 07/19/21 1309 07/20/21 0238  NA 130* 128* 131*  K 5.0 4.8 4.7  CL 83* 90* 87*  CO2 32  --  31  GLUCOSE 106* 103* 110*  BUN 51* 48* 53*  CREATININE 1.63* 1.70* 1.45*  CALCIUM 10.1  --  9.5  MG  --   --  2.0    GFR: CrCl cannot be calculated (Unknown ideal weight.). Recent Labs  Lab 07/19/21 1302 07/19/21 1528 07/20/21 0238  WBC 10.1  --  5.5  LATICACIDVEN 2.3* 0.8  --     Julian Hy, DO 07/20/21 7:33 PM [facility]

## 2021-07-20 NOTE — Plan of Care (Signed)
  Problem: Clinical Measurements: Goal: Ability to maintain clinical measurements within normal limits will improve Outcome: Progressing Goal: Respiratory complications will improve Outcome: Progressing   Problem: Elimination: Goal: Will not experience complications related to urinary retention Outcome: Progressing   Problem: Safety: Goal: Ability to remain free from injury will improve Outcome: Progressing   Problem: Skin Integrity: Goal: Risk for impaired skin integrity will decrease Outcome: Progressing    Cindy S. Brigitte Pulse BSN, RN, [place] 07/20/2021 1:21 AM

## 2021-07-20 NOTE — Progress Notes (Signed)
Progress Note    Mike Gray   ZGY:[id number]  DOB: 1941/09/16  DOA: 07/19/2021     1 Date of Service: 07/20/2021   [name] is a 80 yo male with PMH PAF, sCHF, HCM and NSVT s/p ICD, complete heart block, COPD, HTN.  Follows outpatient with cardiology, last seen 02/28/2021. Patient was unable to provide any collateral information on admission due to unresponsive state.  His brother was called on admission and provided HPI.  He states that the patient has been becoming more slow with his mentation over the past several weeks and has had difficulty ambulating and has been stumbling/falling.  Due to no improvement, he was going to bring him in for further evaluation when he collapsed and became unresponsive on the way to the car today. He denied seeing any seizure-like activity.  On arrival to the ER the patient was found to be hypercarbic with PCO2 112.  He was placed on BiPAP.  The patient's brother also confirmed code status of DNR/DNI but wished for treatment otherwise.   After admission, he showed improvement in mentation on BiPAP and was able to be weaned off on 07/20/2021.  Subjective:  No events overnight.  Patient able to be weaned off of BiPAP this morning.  He is much more awake and alert, still slightly confused but much improved.  Able to tell me his name, his brother's name, the year.  Hungry and amenable for starting on a diet as well.  Hospital Problems * Respiratory failure with hypercapnia ([place]) - lives with brother; no noted seizure activity; patient collapsed when walking to the car with brother on day of admission - similar hospitalization noted 06/2020 and attributed to COPD exac/CHF exac - initial ABG: 7.135/112/247  -Treated with BiPAP on admission, able to be weaned off on 07/20/2021 -Not much improvement in ammonia level with lactulose however patient more awake and alert this morning, correlating with hypercarbia more likely the culprit - confirmed DNR/DNI status  with brother on admission  - CT head: Atrophy, chronic microvascular disease -Valproic acid level, 58 (normal)  Chronic systolic CHF (congestive heart failure) ([place]) - Last echo 07/05/2020: EF 40 to 45%, undetermined diastolic function - Check BNP = 3772 - CXR relatively clear; no significant volume overload appreciated; does have R>L LE edema (~2+) - hold off on lasix for now pending response to steroids   COPD with acute exacerbation ([place]) - s/p BiPAP - will empirically treat for exacerbation; continue steroids   Acute renal failure superimposed on stage 3a chronic kidney disease ([place]) - patient has history of CKD3a. Baseline creat ~ 1.2, eGFR 53 - patient presents with increase in creat >0.3 mg/dL above baseline, creat increase >1.5x baseline presumed to have occurred within past 7 days PTA - creatinine 1.63 on admission  - differential includes prerenal from poor intake vs cardiorenal vs ATN from combo - not on diuretics at home; does have LE pitting edema (patient states this is baseline) but no vascular congestion on CXR - trend renal function for now - now that awake/alert can remove foley    PAF (paroxysmal atrial fibrillation) (HCC) - Awake and alert this morning - Resume amiodarone and Eliquis  ICD (implantable cardioverter-defibrillator) in place - continue tele  - ICD showed normal function on last check 05/10/21, due next in mid Oct  Hypertrophic cardiomyopathy ([place]) - Follows with cardiology - [employer] ICD in place.  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 - On Toprol at home - patient  is DNR/DNI, confirmed with his brother on admission   Bipolar disorder ([place]) - Checking Depakote level, normal - On Depakote ER 1000 mg daily at home - resumed depakote   Increased ammonia level - NH3 60 on admission; no known liver disease - unclear etiology at this time - AST very mild elevation, otherwise normal LFTs - did not retain enema long with lactulose -  hold off on further treatment or level monitoring given improvement in mentation     Objective Vital signs were reviewed and unremarkable except for: No longer on BiPAP  Exam General appearance: Awake and alert; oriented to name and year Head: Normocephalic, without obvious abnormality, atraumatic Eyes:  EOMI Lungs: clear to auscultation bilaterally, mild wheezing in anterior lung fields Heart: regular rate and rhythm and S1, S2 normal Abdomen: normal findings: bowel sounds normal and soft, non-tender Extremities:  2+ R>L LE pitting edema Skin: mobility and turgor normal Neurologic: no focal deficits  Labs / Other Information My review of labs, imaging, notes and other tests is significant for improvement in renal function    Time spent: Greater than 50% of the 35 minute visit was spent in counseling/coordination of care for the patient as laid out in the A&P.  Mike Dee, MD [facility] 07/20/2021, 1:07 PM

## 2021-07-21 ENCOUNTER — Inpatient Hospital Stay (HOSPITAL_COMMUNITY): Payer: Medicare Other

## 2021-07-21 ENCOUNTER — Other Ambulatory Visit: Payer: Self-pay

## 2021-07-21 DIAGNOSIS — J9622 Acute and chronic respiratory failure with hypercapnia: Secondary | ICD-10-CM

## 2021-07-21 DIAGNOSIS — J9602 Acute respiratory failure with hypercapnia: Secondary | ICD-10-CM | POA: Diagnosis not present

## 2021-07-21 DIAGNOSIS — J9621 Acute and chronic respiratory failure with hypoxia: Secondary | ICD-10-CM

## 2021-07-21 DIAGNOSIS — J441 Chronic obstructive pulmonary disease with (acute) exacerbation: Secondary | ICD-10-CM | POA: Diagnosis not present

## 2021-07-21 LAB — CBC WITH DIFFERENTIAL/PLATELET
Abs Immature Granulocytes: 0.03 10*3/uL (ref 0.00–0.07)
Basophils Absolute: 0 10*3/uL (ref 0.0–0.1)
Basophils Relative: 0 %
Eosinophils Absolute: 0 10*3/uL (ref 0.0–0.5)
Eosinophils Relative: 0 %
HCT: 37.7 % — ABNORMAL LOW (ref 39.0–52.0)
Hemoglobin: 12 g/dL — ABNORMAL LOW (ref 13.0–17.0)
Immature Granulocytes: 0 %
Lymphocytes Relative: 4 %
Lymphs Abs: 0.3 10*3/uL — ABNORMAL LOW (ref 0.7–4.0)
MCH: 28 pg (ref 26.0–34.0)
MCHC: 31.8 g/dL (ref 30.0–36.0)
MCV: 87.9 fL (ref 80.0–100.0)
Monocytes Absolute: 0.3 10*3/uL (ref 0.1–1.0)
Monocytes Relative: 3 %
Neutro Abs: 8.5 10*3/uL — ABNORMAL HIGH (ref 1.7–7.7)
Neutrophils Relative %: 93 %
Platelets: 211 10*3/uL (ref 150–400)
RBC: 4.29 MIL/uL (ref 4.22–5.81)
RDW: 15.8 % — ABNORMAL HIGH (ref 11.5–15.5)
WBC: 9.2 10*3/uL (ref 4.0–10.5)
nRBC: 0 % (ref 0.0–0.2)

## 2021-07-21 LAB — GLUCOSE, CAPILLARY
Glucose-Capillary: 120 mg/dL — ABNORMAL HIGH (ref 70–99)
Glucose-Capillary: 118 mg/dL — ABNORMAL HIGH (ref 70–99)
Glucose-Capillary: 117 mg/dL — ABNORMAL HIGH (ref 70–99)
Glucose-Capillary: 137 mg/dL — ABNORMAL HIGH (ref 70–99)
Glucose-Capillary: 99 mg/dL (ref 70–99)

## 2021-07-21 LAB — BASIC METABOLIC PANEL
Anion gap: 12 (ref 5–15)
BUN: 49 mg/dL — ABNORMAL HIGH (ref 8–23)
CO2: 33 mmol/L — ABNORMAL HIGH (ref 22–32)
Calcium: 9.6 mg/dL (ref 8.9–10.3)
Chloride: 87 mmol/L — ABNORMAL LOW (ref 98–111)
Creatinine, Ser: 1.34 mg/dL — ABNORMAL HIGH (ref 0.61–1.24)
GFR, Estimated: 54 mL/min — ABNORMAL LOW (ref >60)
Glucose, Bld: 120 mg/dL — ABNORMAL HIGH (ref 70–99)
Potassium: 4.8 mmol/L (ref 3.5–5.1)
Sodium: 132 mmol/L — ABNORMAL LOW (ref 135–145)

## 2021-07-21 LAB — RESP PANEL BY RT-PCR (FLU A&B, COVID) ARPGX2
Influenza A by PCR: NEGATIVE
Influenza B by PCR: NEGATIVE
SARS Coronavirus 2 by RT PCR: NEGATIVE

## 2021-07-21 LAB — PROCALCITONIN: Procalcitonin: <0.1 ng/mL

## 2021-07-21 LAB — MAGNESIUM: Magnesium: 2 mg/dL (ref 1.7–2.4)

## 2021-07-21 MED ORDER — METHYLPREDNISOLONE SODIUM SUCC 40 MG IJ SOLR
40.0000 mg | INTRAMUSCULAR | Status: DC
  Administered 2021-07-21 – 2021-07-24 (×4): 40 mg via INTRAVENOUS
  Filled 2021-07-21 (×4): qty 1

## 2021-07-21 MED ORDER — AZITHROMYCIN 250 MG PO TABS
500.0000 mg | ORAL_TABLET | Freq: Every day | ORAL | Status: DC
  Administered 2021-07-21: 500 mg via ORAL
  Filled 2021-07-21: qty 2

## 2021-07-21 NOTE — Progress Notes (Signed)
9 beat run of NSVT. Patient with known HFrEF and AICD in place. Electrolytes not severely deranged this morning compared to his recent baseline, K+>4 and Mg+ 2.  Keep on tele monitoring. Can recheck lytes if having more frequent or longer runs of NSVT.  Julian Hy, DO 07/21/21 4:56 PM [facility]

## 2021-07-21 NOTE — Progress Notes (Signed)
PT Cancellation Note  Patient Details Name: Doss Cybulski MRN: [id number] DOB: 02/26/1941   Cancelled Treatment:    Reason Eval/Treat Not Completed: Medical issues which prohibited therapy, on BiPAP, SOB for bed activities per RN. Will check back tomorrow.   Tresa Endo PT Acute Rehabilitation Services Pager [phone] Office [phone]     Claretha Cooper 07/21/2021, 11:51 AM

## 2021-07-21 NOTE — Progress Notes (Signed)
NAME:  Mike Gray, MRN:  [id number], DOB:  1940/11/06, LOS: 2 ADMISSION DATE:  07/19/2021, CONSULTATION DATE:  9/27 REFERRING MD:  Darrell Jewel, CHIEF COMPLAINT:  Unresponsive   History of Present Illness:  This is a 80 year old male with a past medical history significant for COPD who presented to the [facility] emergency department unresponsive.  He was found to have severe hypercapnic respiratory failure so noninvasive mechanical ventilation was started and pulmonary and critical care medicine was consulted for further evaluation.  The patient's brother provides the history because the patient is obtunded and unable to answer questions at this time.  He states that over the last 2 weeks his brother has become progressively more weak and lethargic.  He says that at baseline he is always slow to respond to questions (he attributes this to the Depakote and other psychiatric medicines that he takes) but in the last 2 weeks things have been much worse.  He has been drowsy and falling asleep.  He also complains of shortness of breath.  He says that his brother uses nebulized medicines at home but he is never used BiPAP and he has never seen a pulmonologist.  He says that he continues to smoke cigarettes daily.  Today he said that his brother was very lethargic and he went out briefly to run some errands.  When he returned he found him near unresponsive.  He was able to get up and walk with assistance to the garage but then he could walk no further and collapsed onto his knees so his brother (primary caregiver) called 911.  Pertinent  Medical History  Atrial fibrillation Congestive heart failure COPD Hypertension  Significant Hospital Events: Including procedures, antibiotic start and stop dates in addition to other pertinent events   9/27 admission 9/28 off bipap most of the day  Interim History / Subjective:  Today he denies complaints. He was more tired after a bath and went back on  BiPAP.  Objective   Blood pressure (!) 147/55, pulse 71, temperature 99 F (37.2 C), temperature source Axillary, resp. rate (!) 22, SpO2 92 %.    FiO2 (%):  [70 %] 70 %   Intake/Output Summary (Last 24 hours) at 07/21/2021 0935 Last data filed at 07/21/2021 0845 Gross per 24 hour  Intake 253 ml  Output 1425 ml  Net -1172 ml    There were no vitals filed for this visit.  Examination:  General: chronically ill appearing man lying in bed in NAD HENT: [place]/AT, eyes anicteric, bipap mask in place with large leak where his lip was outside the mask-- improved with repositioning. PULM: breathing comfortably on 16/8, FiO2 70>> 50% with saturations maintaining ~95%. Minimal rhales, no wheezing. CV: S1S2, RRR, paced rhythm GI: soft, NT, ND MSK: no edema or cyanosis Neuro: awake and alert, answering questions appropriately, moving extremities.    Resolved Hospital Problem list     Assessment & Plan:  Acute respiratory failure with hypoxemia and hypercarbia: Due to COPD with acute exacerbation, but there may also be a component of untreated sleep apnea.  His hypercarbia was so severe that it made him encephalopathic and he requires admission to the intensive care unit. [name] stated clearly that he does not want to be intubated. -BiPAP PRN-- he is going to go back to [facility]NC soon.  -Avoid potentially sedating medications due to risk of recurrent severe hypercapnia. - Continue Pulmicort, Brovana.  Needs triple inhaled therapy at discharge.  - DuoNebs as needed -Continue steroids; can  deescalate to once daily. -Con't azithromycin -Wean supplemental oxygen to maintain SpO2 88 to 92%.  Avoid hyperoxia.  -Needs outpatient pulmonary follow-up with PFTs ~4-6 weeks after discharge. -Needs to quit smoking. Has been counseled.  -Likely will need outpatient PR. Start PT & OT here- consults in but not planning on seeing today apparently.  Ongoing tobacco abuse-1 pack/day - Strongly  recommended smoking cessation -Reports he does not need nicotine patch  Acute metabolic encephalopathy secondary to severe hypercarbia, resolved -BiPAP PRN -avoid sedating meds  Goals of care discussed with brother at admission per Dr. Lake Bells.  If the patient's clinical status were to worsen and his hypercarbia does not improve or if he were to decline (bradycardia, shock, respiratory arrest) then he is a full DNR and we should focus completely on his comfort rather than making resuscitative efforts.  Best Practice (right click and "Reselect all SmartList Selections" daily)   Per [facility]  Labs   CBC: Recent Labs  Lab 07/19/21 1302 07/19/21 1309 07/20/21 0238 07/21/21 0243  WBC 10.1  --  5.5 9.2  NEUTROABS 7.5  --  4.9 8.5*  HGB 13.2 15.0 12.0* 12.0*  HCT 42.2 44.0 37.7* 37.7*  MCV 88.8  --  87.7 87.9  PLT 279  --  211 211     Basic Metabolic Panel: Recent Labs  Lab 07/19/21 1302 07/19/21 1309 07/20/21 0238 07/21/21 0243  NA 130* 128* 131* 132*  K 5.0 4.8 4.7 4.8  CL 83* 90* 87* 87*  CO2 32  --  31 33*  GLUCOSE 106* 103* 110* 120*  BUN 51* 48* 53* 49*  CREATININE 1.63* 1.70* 1.45* 1.34*  CALCIUM 10.1  --  9.5 9.6  MG  --   --  2.0 2.0    GFR: CrCl cannot be calculated (Unknown ideal weight.). Recent Labs  Lab 07/19/21 1302 07/19/21 1528 07/20/21 0238 07/21/21 0243  WBC 10.1  --  5.5 9.2  LATICACIDVEN 2.3* 0.8  --   --     Julian Hy, DO 07/21/21 10:13 AM [facility]

## 2021-07-21 NOTE — Progress Notes (Signed)
Progress Note    Mike Gray   BJY:[id number]  DOB: [birth year]  DOA: 07/19/2021     2 Date of Service: 07/21/2021   Mike Gray is a 80 yo male with PMH PAF, sCHF, HCM and NSVT s/p ICD, complete heart block, COPD, HTN.  Follows outpatient with cardiology, last seen 02/28/2021. Patient was unable to provide any collateral information on admission due to unresponsive state.  His brother was called on admission and provided HPI.  He states that the patient has been becoming more slow with his mentation over the past several weeks and has had difficulty ambulating and has been stumbling/falling.  Due to no improvement, he was going to bring him in for further evaluation when he collapsed and became unresponsive on the way to the car today. He denied seeing any seizure-like activity.  On arrival to the ER the patient was found to be hypercarbic with PCO2 112.  He was placed on BiPAP.  The patient's brother also confirmed code status of DNR/DNI but wished for treatment otherwise.   After admission, he showed improvement in mentation on BiPAP and was able to be weaned off on 07/20/2021.  Subjective:  No events overnight.  On BiPAP this morning.  Having some shortness of breath but able to carry on conversation easily.  Still confused but oriented to name and year.  Hospital Problems * Respiratory failure with hypercapnia ([place]) - lives with brother; no noted seizure activity; patient collapsed when walking to the car with brother on day of admission - similar hospitalization noted 06/2020 and attributed to COPD exac/CHF exac - initial ABG: 7.135/112/247  -Treated with BiPAP on admission, able to be weaned off on 07/20/2021 - confirmed DNR/DNI status with brother on admission; BiPAP use okay - CT head: Atrophy, chronic microvascular disease -Valproic acid level, 58 (normal)  Chronic systolic CHF (congestive heart failure) ([place]) - Last echo 07/05/2020: EF 40 to 45%, undetermined diastolic function -  Check BNP = 3772 - CXR relatively clear; no significant volume overload appreciated; does have R>L LE edema (~2+) - hold off on lasix for now pending response to steroids   COPD with acute exacerbation ([place]) - s/p BiPAP - will empirically treat for exacerbation; continue steroids  - CXR repeated on 9/29; worsening opacities appreciated in RLL; starting azithro for now - PCT negative x 1  Acute renal failure superimposed on stage 3a chronic kidney disease ([place]) - patient has history of CKD3a. Baseline creat ~ 1.2, eGFR 53 - patient presents with increase in creat >0.3 mg/dL above baseline, creat increase >1.5x baseline presumed to have occurred within past 7 days PTA - creatinine 1.63 on admission  - differential includes prerenal from poor intake vs cardiorenal vs ATN from combo - not on diuretics at home; does have LE pitting edema (patient states this is baseline) but no vascular congestion on CXR - trend renal function for now - now that awake/alert can remove foley    PAF (paroxysmal atrial fibrillation) (HCC) - Awake and alert this morning - Resume amiodarone and Eliquis  ICD (implantable cardioverter-defibrillator) in place - continue tele  - ICD showed normal function on last check 05/10/21, due next in mid Oct  Hypertrophic cardiomyopathy ([place]) - Follows with cardiology - Medtronic ICD in place.  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  Vitals:   07/21/21 1200 07/21/21 1300 07/21/21 1400 07/21/21 1500  BP:  (!) 139/58 (!) 113/58 (!) 126/43 (!) 128/56  Pulse: 86 100 99 91  Resp: (!) 21 (!) 22 (!) 21 (!) 27  Temp:      TempSrc:      SpO2: 92% 94% 93% 93%      Exam General appearance: Awake and alert; oriented to name and year Head: Normocephalic, without obvious abnormality, atraumatic Eyes:  EOMI Lungs: clear to auscultation bilaterally, mild wheezing in anterior lung fields Heart: regular rate and rhythm and S1, S2 normal Abdomen: normal findings: bowel sounds normal and soft, non-tender Extremities:  2+ R>L LE pitting edema Skin: mobility and turgor normal Neurologic: no focal deficits  Labs / Other Information My review of labs, imaging, notes and other tests is significant for RLL opacity on CXR    Time spent: Greater than 50% of the 35 minute visit was spent in counseling/coordination of care for the patient as laid out in the A&P.  Dwyane Dee, MD [facility] 07/21/2021, 3:24 PM

## 2021-07-21 NOTE — Progress Notes (Signed)
OT Cancellation Note  Patient Details Name: Mike Gray MRN: [id number] DOB: [birth year]   Cancelled Treatment:    Reason Eval/Treat Not Completed: Medical issues which prohibited therapy. Patient still on Bipap this AM with RN reporting a bed bath exhausted patient. MD stating patient not moving air as well today and once results in for COVID swab plans to order chest XR. Will check back as schedule permits.  [name] OT OT pager: [place] 07/21/2021, 9:20 AM

## 2021-07-22 ENCOUNTER — Inpatient Hospital Stay (HOSPITAL_COMMUNITY): Payer: Medicare Other

## 2021-07-22 LAB — GLUCOSE, CAPILLARY
Glucose-Capillary: 117 mg/dL — ABNORMAL HIGH (ref 70–99)
Glucose-Capillary: 119 mg/dL — ABNORMAL HIGH (ref 70–99)
Glucose-Capillary: 121 mg/dL — ABNORMAL HIGH (ref 70–99)
Glucose-Capillary: 125 mg/dL — ABNORMAL HIGH (ref 70–99)
Glucose-Capillary: 137 mg/dL — ABNORMAL HIGH (ref 70–99)
Glucose-Capillary: 173 mg/dL — ABNORMAL HIGH (ref 70–99)

## 2021-07-22 LAB — BASIC METABOLIC PANEL
Anion gap: 9 (ref 5–15)
BUN: 45 mg/dL — ABNORMAL HIGH (ref 8–23)
CO2: 37 mmol/L — ABNORMAL HIGH (ref 22–32)
Calcium: 10.1 mg/dL (ref 8.9–10.3)
Chloride: 91 mmol/L — ABNORMAL LOW (ref 98–111)
Creatinine, Ser: 1.23 mg/dL (ref 0.61–1.24)
GFR, Estimated: 60 mL/min — ABNORMAL LOW (ref >60)
Glucose, Bld: 108 mg/dL — ABNORMAL HIGH (ref 70–99)
Potassium: 4.8 mmol/L (ref 3.5–5.1)
Sodium: 137 mmol/L (ref 135–145)

## 2021-07-22 LAB — CBC WITH DIFFERENTIAL/PLATELET
Abs Immature Granulocytes: 0.06 10*3/uL (ref 0.00–0.07)
Basophils Absolute: 0 10*3/uL (ref 0.0–0.1)
Basophils Relative: 0 %
Eosinophils Absolute: 0 10*3/uL (ref 0.0–0.5)
Eosinophils Relative: 0 %
HCT: 42.5 % (ref 39.0–52.0)
Hemoglobin: 13.3 g/dL (ref 13.0–17.0)
Immature Granulocytes: 0 %
Lymphocytes Relative: 1 %
Lymphs Abs: 0.2 10*3/uL — ABNORMAL LOW (ref 0.7–4.0)
MCH: 27.8 pg (ref 26.0–34.0)
MCHC: 31.3 g/dL (ref 30.0–36.0)
MCV: 88.7 fL (ref 80.0–100.0)
Monocytes Absolute: 0.8 10*3/uL (ref 0.1–1.0)
Monocytes Relative: 6 %
Neutro Abs: 13 10*3/uL — ABNORMAL HIGH (ref 1.7–7.7)
Neutrophils Relative %: 93 %
Platelets: 203 10*3/uL (ref 150–400)
RBC: 4.79 MIL/uL (ref 4.22–5.81)
RDW: 15.9 % — ABNORMAL HIGH (ref 11.5–15.5)
WBC: 14 10*3/uL — ABNORMAL HIGH (ref 4.0–10.5)
nRBC: 0 % (ref 0.0–0.2)

## 2021-07-22 LAB — EXPECTORATED SPUTUM ASSESSMENT W GRAM STAIN, RFLX TO RESP C

## 2021-07-22 LAB — STREP PNEUMONIAE URINARY ANTIGEN: Strep Pneumo Urinary Antigen: NEGATIVE

## 2021-07-22 LAB — PROCALCITONIN: Procalcitonin: <0.1 ng/mL

## 2021-07-22 LAB — MAGNESIUM: Magnesium: 2.1 mg/dL (ref 1.7–2.4)

## 2021-07-22 MED ORDER — ACETAMINOPHEN 325 MG PO TABS
650.0000 mg | ORAL_TABLET | Freq: Once | ORAL | Status: AC
  Administered 2021-07-22: 650 mg via ORAL
  Filled 2021-07-22: qty 2

## 2021-07-22 MED ORDER — SODIUM CHLORIDE 0.9 % IV SOLN
3.0000 g | Freq: Four times a day (QID) | INTRAVENOUS | Status: AC
  Administered 2021-07-22 – 2021-07-27 (×20): 3 g via INTRAVENOUS
  Filled 2021-07-22 (×21): qty 8

## 2021-07-22 NOTE — Progress Notes (Signed)
[place] Progress Note Patient Name: Mike Gray DOB: 03/11/1941 MRN: [id number]   Date of Service  07/22/2021  HPI/Events of Note  Fever to 101.1 F - Nursing request for Tylenol AST = 43 which is slightly elevated. Already on Azithromycin.  eICU Interventions  Plan: Tylenol 650 mg PO X 1.  Defer further culture workup to rounding team.      Intervention Category Major Interventions: Infection - evaluation and management  Tnia Anglada [place] 07/22/2021, 12:39 AM

## 2021-07-22 NOTE — Progress Notes (Signed)
Pt states that he does not want to wear BIPAP tonight.  Pt currently on 6 LPM [place] and tolerating well at this time.

## 2021-07-22 NOTE — Evaluation (Signed)
Physical Therapy Evaluation Patient Details Name: Mike Gray MRN: [id number] DOB: 1941-04-01 Today's Date: 07/22/2021  History of Present Illness  Patient is a 80 year old male admited 9/27 after collapsed when walking to the car with brother. Pt dx with Respiratory failure with hypercapnia, COPD with acute exacerbation. PMH includes PAF, sCHF, HCM and NSVT s/p ICD, complete heart block, COPD, HTN  Clinical Impression  The patient Is lethargic, required sternal rub and max stimulation to arouse  and stay aroused. Placed in bed chair position to stimulate arousal. Total assistnane to mobilize to sitting on bed edge. Patient sat x ~ 4 minutes. + 2 max to stand at bed edge for a few seconds. Patient has head forward posture. Patient unable to provide  personal information a nd  caregiver availability.    HR 85, SPO2 94-87% with activity. RR 27 Pt admitted with above diagnosis.  Pt currently with functional limitations due to the deficits listed below (see PT Problem List). Pt will benefit from skilled PT to increase their independence and safety with mobility to allow discharge to the venue listed below.        Recommendations for follow up therapy are one component of a multi-disciplinary discharge planning process, led by the attending physician.  Recommendations may be updated based on patient status, additional functional criteria and insurance authorization.  Follow Up Recommendations SNF    Equipment Recommendations  None recommended by PT    Recommendations for Other Services       Precautions / Restrictions Precautions Precautions: Fall Precaution Comments: monitor sats Restrictions Weight Bearing Restrictions: No      Mobility  Bed Mobility Overal bed mobility: Needs Assistance Bed Mobility: Supine to Sit;Sit to Supine     Supine to sit: Total assist;+2 for physical assistance Sit to supine: Total assist;+2 for physical assistance;+2 for safety/equipment   General  bed mobility comments: does not initiate bed mobility to sit up. needing total A to lift legs and guide trunk back to bed    Transfers Overall transfer level: Needs assistance Equipment used: None Transfers: Sit to/from Stand Sit to Stand: Max assist;+2 physical assistance;+2 safety/equipment         General transfer comment: patient puts forth ~10% effort to support weight through legs while therapy provide max A x2 to stand for ~10 seconds while RN replace soiled bed pad  Ambulation/Gait                Stairs            Wheelchair Mobility    Modified Rankin (Stroke Patients Only)       Balance Overall balance assessment: Needs assistance Sitting-balance support: Feet supported Sitting balance-Leahy Scale: Poor     Standing balance support: During functional activity Standing balance-Leahy Scale: Zero Standing balance comment: max x2                             Pertinent Vitals/Pain Pain Assessment: Faces Faces Pain Scale: No hurt    Home Living Family/patient expects to be discharged to:: Unsure Living Arrangements: Other relatives (brother per chart, patient states "my cat") Available Help at Discharge: Family Type of Home: House           Additional Comments: patient is unable to provide history either difficulty processing vs just not responding to questions    Prior Function           Comments: patient unable to  provide accurate history at this time, does report using walker "lately"     Hand Dominance   Dominant Hand:  (did not specify)    Extremity/Trunk Assessment   Upper Extremity Assessment Upper Extremity Assessment: Generalized weakness RUE Deficits / Details: global weakness throughout, difficult to get patient to participate in UE assessment. Does lift bilateral arms when prompted at end of session in order to place pillows demonstrating 3/5 strength at shoulders/elbows.    Lower Extremity  Assessment Lower Extremity Assessment: Generalized weakness    Cervical / Trunk Assessment Cervical / Trunk Assessment: Kyphotic  Communication   Communication: Other (comment)  Cognition Arousal/Alertness: Lethargic Behavior During Therapy: Flat affect Overall Cognitive Status: No family/caregiver present to determine baseline cognitive functioning                                 General Comments: Unsure if patient not responding to most questions due to lethargy, wanting to be left alone "why the hell is everyone bothering me" or if having difficulty processing. Does not initiate mobility with max prompting      General Comments      Exercises     Assessment/Plan    PT Assessment Patient needs continued PT services  PT Problem List Decreased strength;Decreased mobility;Decreased knowledge of precautions;Decreased cognition;Decreased activity tolerance;Cardiopulmonary status limiting activity       PT Treatment Interventions Therapeutic activities;Cognitive remediation;Gait training;Therapeutic exercise;Patient/family education;Functional mobility training;Balance training    PT Goals (Current goals can be found in the Care Plan section)  Acute Rehab PT Goals Patient Stated Goal: "why is everyone bothering me" PT Goal Formulation: Patient unable to participate in goal setting Time For Goal Achievement: 08/05/21 Potential to Achieve Goals: Fair    Frequency Min 2X/week   Barriers to discharge        Co-evaluation PT/OT/SLP Co-Evaluation/Treatment: Yes Reason for Co-Treatment: For patient/therapist safety PT goals addressed during session: Mobility/safety with mobility OT goals addressed during session: ADL's and self-care       AM-PAC PT "6 Clicks" Mobility  Outcome Measure Help needed turning from your back to your side while in a flat bed without using bedrails?: Total Help needed moving from lying on your back to sitting on the side of a flat  bed without using bedrails?: Total Help needed moving to and from a bed to a chair (including a wheelchair)?: Total Help needed standing up from a chair using your arms (e.g., wheelchair or bedside chair)?: Total Help needed to walk in hospital room?: Total Help needed climbing 3-5 steps with a railing? : Total 6 Click Score: 6    End of Session   Activity Tolerance: Patient limited by fatigue;Patient limited by lethargy Patient left: in bed;with bed alarm set;with call bell/phone within reach Nurse Communication: Mobility status PT Visit Diagnosis: Difficulty in walking, not elsewhere classified (R26.2);Muscle weakness (generalized) (M62.81)    Time: 2979-8921 PT Time Calculation (min) (ACUTE ONLY): 18 min   Charges:   PT Evaluation $PT Eval Low Complexity: [place] PT Acute Rehabilitation Services Pager [phone] Office [phone]   Claretha Cooper 07/22/2021, 1:15 PM

## 2021-07-22 NOTE — Evaluation (Signed)
Occupational Therapy Evaluation Patient Details Name: Mike Gray MRN: [id number] DOB: February 16, 1941 Today's Date: 07/22/2021   History of Present Illness Patient is a 80 year old male admited 9/27 after collapsed when walking to the car with brother. Pt dx with Respiratory failure with hypercapnia, COPD with acute exacerbation. PMH includes PAF, sCHF, HCM and NSVT s/p ICD, complete heart block, COPD, HTN   Clinical Impression   Patient difficult to arouse upon arrival needing sternal rub and use of cool wash cloth to increase alertness (RN is aware of this). Patient minimally responding to therapist's questions unsure if due to alertness, difficulty processing or just wanting to be left alone as patient does state "why the hell is everyone bothering me." Patient needing total A x2 to transition to sitting at edge of bed with moderate improvement in keeping eyes open. Max x2 to stand at edge of bed for ~10 seconds while RN change soiled bed pad. Patient currently inaccurate historian as he states he lives with his cat however chart stating brother is his caregiver, unsure patient's PLOF at this time. Would currently  recommend rehab at D/C as patient with significant weakness, poor endurance and desats to 87-88% on 5L with minimal activity. Acute OT to follow.       Recommendations for follow up therapy are one component of a multi-disciplinary discharge planning process, led by the attending physician.  Recommendations may be updated based on patient status, additional functional criteria and insurance authorization.   Follow Up Recommendations  SNF    Equipment Recommendations  Other (comment) (TBD, unsure patient's home DME)       Precautions / Restrictions Precautions Precautions: Fall Precaution Comments: monitor sats Restrictions Weight Bearing Restrictions: No      Mobility Bed Mobility Overal bed mobility: Needs Assistance Bed Mobility: Supine to Sit;Sit to Supine     Supine  to sit: Total assist;+2 for physical assistance Sit to supine: Total assist;+2 for physical assistance;+2 for safety/equipment   General bed mobility comments: does not initiate bed mobility to sit up. needing total A to lift legs and guide trunk back to bed    Transfers Overall transfer level: Needs assistance Equipment used: None Transfers: Sit to/from Stand Sit to Stand: Max assist;+2 physical assistance;+2 safety/equipment         General transfer comment: patient puts forth ~10% effort to support weight through legs while therapy provide max A x2 to stand for ~10 seconds while RN replace soiled bed pad    Balance Overall balance assessment: Needs assistance Sitting-balance support: Feet supported Sitting balance-Leahy Scale: Poor     Standing balance support: During functional activity Standing balance-Leahy Scale: Zero Standing balance comment: max x2                           ADL either performed or assessed with clinical judgement   ADL Overall ADL's : Needs assistance/impaired     Grooming: Maximal assistance;Sitting;Bed level   Upper Body Bathing: Maximal assistance;Bed level   Lower Body Bathing: Total assistance;Bed level   Upper Body Dressing : Maximal assistance;Sitting;Bed level   Lower Body Dressing: Total assistance;Bed level   Toilet Transfer: +2 for physical assistance;+2 for safety/equipment;Maximal assistance Toilet Transfer Details (indicate cue type and reason): to power up to standing from edge of bed, max A x2 to maintain static standing for ~10 seconds with RN replace soiled bed sheet Toileting- Clothing Manipulation and Hygiene: Total assistance;Bed level  Functional mobility during ADLs: Maximal assistance;+2 for physical assistance;+2 for safety/equipment General ADL Comments: patient presenting with significant weakness, poor activity tolerance and desat with O2 to 87-88% on 5L with minimal standing activity. patient  needing extensive assistance for all self care tasks at this time.      Pertinent Vitals/Pain Pain Assessment: Faces Faces Pain Scale: No hurt     Hand Dominance  (did not specify)   Extremity/Trunk Assessment Upper Extremity Assessment Upper Extremity Assessment: RUE deficits/detail;LUE deficits/detail RUE Deficits / Details: global weakness throughout, difficult to get patient to participate in UE assessment. Does lift bilateral arms when prompted at end of session in order to place pillows demonstrating 3/5 strength at shoulders/elbows.   Lower Extremity Assessment Lower Extremity Assessment: Defer to PT evaluation   Cervical / Trunk Assessment Cervical / Trunk Assessment: Kyphotic   Communication Communication Communication: Other (comment) (not very conversive)   Cognition Arousal/Alertness: Lethargic Behavior During Therapy: Flat affect Overall Cognitive Status: No family/caregiver present to determine baseline cognitive functioning                                 General Comments: Unsure if patient not responding to most questions due to lethargy, wanting to be left alone "why the hell is everyone bothering me" or if having difficulty processing. Does not initiate mobility with max prompting              Home Living Family/patient expects to be discharged to:: Private residence Living Arrangements: Other relatives (brother per chart, patient states "my cat") Available Help at Discharge: Family Type of Home: House                           Additional Comments: patient is unable to provide history either difficulty processing vs just not responding to questions      Prior Functioning/Environment          Comments: patient unable to provide accurate history at this time, does report using walker "lately"        OT Problem List: Decreased strength;Decreased activity tolerance;Impaired balance (sitting and/or standing);Decreased  cognition;Decreased safety awareness      OT Treatment/Interventions: Self-care/ADL training;Therapeutic exercise;DME and/or AE instruction;Therapeutic activities;Cognitive remediation/compensation;Patient/family education;Balance training    OT Goals(Current goals can be found in the care plan section) Acute Rehab OT Goals Patient Stated Goal: "why is everyone bothering me" OT Goal Formulation: With patient Time For Goal Achievement: 08/05/21 Potential to Achieve Goals: Fair  OT Frequency: Min 2X/week           Co-evaluation PT/OT/SLP Co-Evaluation/Treatment: Yes Reason for Co-Treatment: To address functional/ADL transfers;For patient/therapist safety PT goals addressed during session: Mobility/safety with mobility OT goals addressed during session: ADL's and self-care;Strengthening/ROM      AM-PAC OT "6 Clicks" Daily Activity     Outcome Measure Help from another person eating meals?: A Lot Help from another person taking care of personal grooming?: A Lot Help from another person toileting, which includes using toliet, bedpan, or urinal?: Total Help from another person bathing (including washing, rinsing, drying)?: A Lot Help from another person to put on and taking off regular upper body clothing?: A Lot Help from another person to put on and taking off regular lower body clothing?: Total 6 Click Score: 10   End of Session Equipment Utilized During Treatment: Oxygen;Gait belt Nurse Communication: Mobility status  Activity Tolerance: Patient limited  by fatigue;Patient limited by lethargy Patient left: in bed;with call bell/phone within reach;with bed alarm set  OT Visit Diagnosis: Unsteadiness on feet (R26.81);Other abnormalities of gait and mobility (R26.89);Muscle weakness (generalized) (M62.81);History of falling (Z91.81);Other symptoms and signs involving cognitive function                Time: 0840-0900 OT Time Calculation (min): 20 min Charges:  OT General  Charges $OT Visit: 1 Visit OT Evaluation $OT Eval Moderate Complexity: [place] OT OT pager: [phone]  Rosemary Holms 07/22/2021, 12:15 PM

## 2021-07-22 NOTE — TOC Progression Note (Signed)
Transition of Care [facility]) - Progression Note    Patient Details  Name: Mike Gray MRN: [id number] Date of Birth: 1941/10/18  [facility] CM/SW Contact  Leeroy Cha, RN Phone Number: 07/22/2021, 8:22 AM  Clinical Narrative:    Hospital Problems * Respiratory failure with hypercapnia ([place]) - lives with brother; no noted seizure activity; patient collapsed when walking to the car with brother on day of admission - similar hospitalization noted 06/2020 and attributed to COPD exac/CHF exac - initial ABG: 7.135/112/247  -Treated with BiPAP on admission, able to be weaned off on 07/20/2021 - confirmed DNR/DNI status with brother on admission; BiPAP use okay - CT head: Atrophy, chronic microvascular disease -Valproic acid level, 58 (normal)   Chronic systolic CHF (congestive heart failure) ([place]) - Last echo 07/05/2020: EF 40 to 45%, undetermined diastolic function - Check BNP = 3772 - CXR relatively clear; no significant volume overload appreciated; does have R>L LE edema (~2+) - hold off on lasix for now pending response to steroids    COPD with acute exacerbation ([place]) - s/p BiPAP - will empirically treat for exacerbation; continue steroids  - CXR repeated on 9/29; worsening opacities appreciated in RLL; starting azithro for now - PCT negative x 1   Acute renal failure superimposed on stage 3a chronic kidney disease ([place]) - patient has history of CKD3a. Baseline creat ~ 1.2, eGFR 53 - patient presents with increase in creat >0.3 mg/dL above baseline, creat increase >1.5x baseline presumed to have occurred within past 7 days PTA - creatinine 1.63 on admission  - differential includes prerenal from poor intake vs cardiorenal vs ATN from combo - not on diuretics at home; does have LE pitting edema (patient states this is baseline) but no vascular congestion on CXR - trend renal function for now - now that awake/alert can remove foley      PAF (paroxysmal atrial  fibrillation) (HCC) - Awake and alert this morning - Resume amiodarone and Eliquis   ICD (implantable cardioverter-defibrillator) in place - continue tele  - ICD showed normal function on last check 05/10/21, due next in mid Oct   Hypertrophic cardiomyopathy ([place]) - Follows with cardiology - Medtronic ICD in place.  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   Expected Discharge Plan: Home/Self Care Barriers to Discharge: Continued Medical Work up  Expected Discharge Plan and Services Expected Discharge Plan: Home/Self Care   Discharge Planning Services: CM Consult   Living arrangements for the past 2 months: Single Family Home                                       Social Determinants of Health (SDOH) Interventions    Readmission Risk Interventions No flowsheet data found.

## 2021-07-22 NOTE — Progress Notes (Signed)
Progress Note    Mike Gray   [id number]  DOB: 1940-11-03  DOA: 07/19/2021     3 Date of Service: 07/22/2021   Mike Gray is a 80 yo male with PMH PAF, sCHF, HCM and NSVT s/p ICD, complete heart block, COPD, HTN.  Follows outpatient with cardiology, last seen 02/28/2021. Patient was unable to provide any collateral information on admission due to unresponsive state.  His brother was called on admission and provided HPI.  He states that the patient has been becoming more slow with his mentation over the past several weeks and has had difficulty ambulating and has been stumbling/falling.  Due to no improvement, he was going to bring him in for further evaluation when he collapsed and became unresponsive on the way to the car today. He denied seeing any seizure-like activity.  On arrival to the ER the patient was found to be hypercarbic with PCO2 112.  He was placed on BiPAP.  The patient's brother also confirmed code status of DNR/DNI but wished for treatment otherwise.   After admission, he showed improvement in mentation on BiPAP and was able to be weaned off on 07/20/2021.  Subjective:  Fever noted overnight. Awake and alert but tired this morning. Had just worked a little with therapy but could not do too much. SNF recommended.   Hospital Problems * Respiratory failure with hypercapnia ([place]) - lives with brother; no noted seizure activity; patient collapsed when walking to the car with brother on day of admission - similar hospitalization noted 06/2020 and attributed to COPD exac/CHF exac - initial ABG: 7.135/112/247  -Treated with BiPAP on admission, able to be weaned off on 07/20/2021 - confirmed DNR/DNI status with brother on admission; BiPAP use okay - CT head: Atrophy, chronic microvascular disease -Valproic acid level, 58 (normal)  Chronic systolic CHF (congestive heart failure) ([place]) - Last echo 07/05/2020: EF 40 to 45%, undetermined diastolic function - Check BNP = 3772 -  CXR relatively clear; no significant volume overload appreciated; does have R>L LE edema (~2+) - hold off on lasix for now pending response to steroids   COPD with acute exacerbation ([place]) - s/p BiPAP - will empirically treat for exacerbation; continue steroids  - CXR repeated on 9/29; worsening opacities appreciated in RLL; started on azithro however given fevers overnight will change to Unasyn in case of aspiration - PCT negative x 3  Acute renal failure superimposed on stage 3a chronic kidney disease ([place]) - patient has history of CKD3a. Baseline creat ~ 1.2, eGFR 53 - patient presents with increase in creat >0.3 mg/dL above baseline, creat increase >1.5x baseline presumed to have occurred within past 7 days PTA - creatinine 1.63 on admission  - differential includes prerenal from poor intake vs cardiorenal vs ATN from combo - not on diuretics at home; does have LE pitting edema (patient states this is baseline) but no vascular congestion on CXR - trend renal function for now - now that awake/alert can remove foley    PAF (paroxysmal atrial fibrillation) (HCC) - Awake and alert this morning - Resume amiodarone and Eliquis  ICD (implantable cardioverter-defibrillator) in place - continue tele  - ICD showed normal function on last check 05/10/21, due next in mid Oct  Hypertrophic cardiomyopathy ([place]) - Follows with cardiology - Medtronic ICD in place.  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  Vitals:   07/22/21 1157  07/22/21 1200 07/22/21 1300 07/22/21 1400  BP:  (!) 122/46 (!) 116/49 (!) 125/47  Pulse:  (!) 59 60 (!) 56  Resp:  (!) 22 (!) 27 19  Temp: 98.2 F (36.8 C)     TempSrc: Oral     SpO2:  (!) 89% 90% 90%      Exam General appearance: Awake and alert; oriented to name and year Head: Normocephalic, without obvious abnormality, atraumatic Eyes:  EOMI Lungs: clear to auscultation bilaterally, mild wheezing in anterior lung fields Heart: regular rate and rhythm and S1, S2 normal Abdomen: normal findings: bowel sounds normal and soft, non-tender Extremities:  2+ R>L LE pitting edema Skin: mobility and turgor normal Neurologic: no focal deficits  Labs / Other Information My review of labs, imaging, notes and other tests is significant for RLL opacity on CXR    Time spent: Greater than 50% of the 35 minute visit was spent in counseling/coordination of care for the patient as laid out in the A&P.  Dwyane Dee, MD [facility] 07/22/2021, 2:04 PM

## 2021-07-23 ENCOUNTER — Inpatient Hospital Stay (HOSPITAL_COMMUNITY): Payer: Medicare Other

## 2021-07-23 LAB — CBC WITH DIFFERENTIAL/PLATELET
Abs Immature Granulocytes: 0.07 10*3/uL (ref 0.00–0.07)
Basophils Absolute: 0 10*3/uL (ref 0.0–0.1)
Basophils Relative: 0 %
Eosinophils Absolute: 0 10*3/uL (ref 0.0–0.5)
Eosinophils Relative: 0 %
HCT: 44.6 % (ref 39.0–52.0)
Hemoglobin: 13.5 g/dL (ref 13.0–17.0)
Immature Granulocytes: 1 %
Lymphocytes Relative: 2 %
Lymphs Abs: 0.3 10*3/uL — ABNORMAL LOW (ref 0.7–4.0)
MCH: 27.1 pg (ref 26.0–34.0)
MCHC: 30.3 g/dL (ref 30.0–36.0)
MCV: 89.6 fL (ref 80.0–100.0)
Monocytes Absolute: 0.5 10*3/uL (ref 0.1–1.0)
Monocytes Relative: 4 %
Neutro Abs: 11.7 10*3/uL — ABNORMAL HIGH (ref 1.7–7.7)
Neutrophils Relative %: 93 %
Platelets: 202 10*3/uL (ref 150–400)
RBC: 4.98 MIL/uL (ref 4.22–5.81)
RDW: 15.9 % — ABNORMAL HIGH (ref 11.5–15.5)
WBC: 12.6 10*3/uL — ABNORMAL HIGH (ref 4.0–10.5)
nRBC: 0 % (ref 0.0–0.2)

## 2021-07-23 LAB — BASIC METABOLIC PANEL
Anion gap: 10 (ref 5–15)
BUN: 59 mg/dL — ABNORMAL HIGH (ref 8–23)
CO2: 36 mmol/L — ABNORMAL HIGH (ref 22–32)
Calcium: 9.8 mg/dL (ref 8.9–10.3)
Chloride: 93 mmol/L — ABNORMAL LOW (ref 98–111)
Creatinine, Ser: 1.34 mg/dL — ABNORMAL HIGH (ref 0.61–1.24)
GFR, Estimated: 54 mL/min — ABNORMAL LOW (ref >60)
Glucose, Bld: 145 mg/dL — ABNORMAL HIGH (ref 70–99)
Potassium: 4.6 mmol/L (ref 3.5–5.1)
Sodium: 139 mmol/L (ref 135–145)

## 2021-07-23 LAB — GLUCOSE, CAPILLARY
Glucose-Capillary: 104 mg/dL — ABNORMAL HIGH (ref 70–99)
Glucose-Capillary: 105 mg/dL — ABNORMAL HIGH (ref 70–99)
Glucose-Capillary: 111 mg/dL — ABNORMAL HIGH (ref 70–99)
Glucose-Capillary: 146 mg/dL — ABNORMAL HIGH (ref 70–99)
Glucose-Capillary: 216 mg/dL — ABNORMAL HIGH (ref 70–99)
Glucose-Capillary: 235 mg/dL — ABNORMAL HIGH (ref 70–99)

## 2021-07-23 LAB — PROCALCITONIN: Procalcitonin: 0.12 ng/mL

## 2021-07-23 LAB — MAGNESIUM: Magnesium: 2 mg/dL (ref 1.7–2.4)

## 2021-07-23 MED ORDER — IOHEXOL 350 MG/ML SOLN
100.0000 mL | Freq: Once | INTRAVENOUS | Status: AC | PRN
  Administered 2021-07-23: 100 mL via INTRAVENOUS

## 2021-07-23 NOTE — Progress Notes (Signed)
Progress Note    Mike Gray   [id number]  DOB: [birth year]  DOA: 07/19/2021     4 Date of Service: 07/23/2021   [name] is a 80 yo male with PMH PAF, sCHF, HCM and NSVT s/p ICD, complete heart block, COPD, HTN.  Follows outpatient with cardiology, last seen 02/28/2021. Patient was unable to provide any collateral information on admission due to unresponsive state.  His brother was called on admission and provided HPI.  He states that the patient has been becoming more slow with his mentation over the past several weeks and has had difficulty ambulating and has been stumbling/falling.  Due to no improvement, he was going to bring him in for further evaluation when he collapsed and became unresponsive on the way to the car today. He denied seeing any seizure-like activity.  On arrival to the ER the patient was found to be hypercarbic with PCO2 112.  He was placed on BiPAP.  The patient's brother also confirmed code status of DNR/DNI but wished for treatment otherwise.   After admission, he showed improvement in mentation on BiPAP and was able to be weaned off on 07/20/2021.  Subjective:  No further fevers in past 24 hours.  Awake and alert resting in bed this morning.  Hospital Problems * Respiratory failure with hypercapnia ([place]) - lives with brother; no noted seizure activity; patient collapsed when walking to the car with brother on day of admission - similar hospitalization noted 06/2020 and attributed to COPD exac/CHF exac - initial ABG: 7.135/112/247  -Treated with BiPAP on admission, able to be weaned off on 07/20/2021 - confirmed DNR/DNI status with brother on admission; BiPAP use okay - CT head: Atrophy, chronic microvascular disease -Valproic acid level, 58 (normal)  Chronic systolic CHF (congestive heart failure) ([place]) - Last echo 07/05/2020: EF 40 to 45%, undetermined diastolic function - Check BNP = 3772 - CXR relatively clear; no significant volume overload  appreciated; does have R>L LE edema (~2+) - hold off on lasix for now pending response to steroids   COPD with acute exacerbation (H[facility]) - s/p BiPAP - will empirically treat for exacerbation; continue steroids  - CXR repeated on 9/29; worsening opacities appreciated in RLL; continue Unasyn - no further fevers past 24 hours  - PCT negative x 3  Acute renal failure superimposed on stage 3a chronic kidney disease ([place]) - patient has history of CKD3a. Baseline creat ~ 1.2, eGFR 53 - patient presents with increase in creat >0.3 mg/dL above baseline, creat increase >1.5x baseline presumed to have occurred within past 7 days PTA - creatinine 1.63 on admission  - differential includes prerenal from poor intake vs cardiorenal vs ATN from combo - not on diuretics at home; does have LE pitting edema (patient states this is baseline) but no vascular congestion on CXR - trend renal function for now - now that awake/alert can remove foley    PAF (paroxysmal atrial fibrillation) (HCC) - Awake and alert this morning - Resume amiodarone and Eliquis  ICD (implantable cardioverter-defibrillator) in place - continue tele  - ICD showed normal function on last check 05/10/21, due next in mid Oct  Hypertrophic cardiomyopathy ([place]) - Follows with cardiology - Medtronic ICD in place.  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  Vitals:   07/23/21 0900 07/23/21 1000 07/23/21 1100 07/23/21 1158  BP: (!) 138/53 (!) 148/63 (!) 146/48   Pulse: 60 60  63   Resp: (!) 27 (!) 29 (!) 21   Temp:    97.7 F (36.5 C)  TempSrc:    Oral  SpO2: 93% 90% 90%       Exam General appearance: Awake and alert; oriented to name and year Head: Normocephalic, without obvious abnormality, atraumatic Eyes:  EOMI Lungs: clear to auscultation bilaterally, mild wheezing in anterior lung fields Heart: regular rate and rhythm and S1, S2 normal Abdomen: normal findings: bowel sounds normal and soft, non-tender Extremities:  2+ R>L LE pitting edema Skin: mobility and turgor normal Neurologic: no focal deficits  Labs / Other Information My review of labs, imaging, notes and other tests is significant for RLL opacity on CXR    Time spent: Greater than 50% of the 35 minute visit was spent in counseling/coordination of care for the patient as laid out in the A&P.  Dwyane Dee, MD [facility] 07/23/2021, 12:07 PM

## 2021-07-24 DIAGNOSIS — J9692 Respiratory failure, unspecified with hypercapnia: Secondary | ICD-10-CM

## 2021-07-24 LAB — BASIC METABOLIC PANEL
Anion gap: 10 (ref 5–15)
BUN: 57 mg/dL — ABNORMAL HIGH (ref 8–23)
CO2: 37 mmol/L — ABNORMAL HIGH (ref 22–32)
Calcium: 9.7 mg/dL (ref 8.9–10.3)
Chloride: 92 mmol/L — ABNORMAL LOW (ref 98–111)
Creatinine, Ser: 1.31 mg/dL — ABNORMAL HIGH (ref 0.61–1.24)
GFR, Estimated: 55 mL/min — ABNORMAL LOW (ref >60)
Glucose, Bld: 131 mg/dL — ABNORMAL HIGH (ref 70–99)
Potassium: 4.3 mmol/L (ref 3.5–5.1)
Sodium: 139 mmol/L (ref 135–145)

## 2021-07-24 LAB — CBC WITH DIFFERENTIAL/PLATELET
Abs Immature Granulocytes: 0.09 10*3/uL — ABNORMAL HIGH (ref 0.00–0.07)
Basophils Absolute: 0 10*3/uL (ref 0.0–0.1)
Basophils Relative: 0 %
Eosinophils Absolute: 0 10*3/uL (ref 0.0–0.5)
Eosinophils Relative: 0 %
HCT: 46.1 % (ref 39.0–52.0)
Hemoglobin: 14.5 g/dL (ref 13.0–17.0)
Immature Granulocytes: 1 %
Lymphocytes Relative: 2 %
Lymphs Abs: 0.3 10*3/uL — ABNORMAL LOW (ref 0.7–4.0)
MCH: 27.6 pg (ref 26.0–34.0)
MCHC: 31.5 g/dL (ref 30.0–36.0)
MCV: 87.8 fL (ref 80.0–100.0)
Monocytes Absolute: 0.5 10*3/uL (ref 0.1–1.0)
Monocytes Relative: 3 %
Neutro Abs: 14.2 10*3/uL — ABNORMAL HIGH (ref 1.7–7.7)
Neutrophils Relative %: 94 %
Platelets: 214 10*3/uL (ref 150–400)
RBC: 5.25 MIL/uL (ref 4.22–5.81)
RDW: 15.8 % — ABNORMAL HIGH (ref 11.5–15.5)
WBC: 15.1 10*3/uL — ABNORMAL HIGH (ref 4.0–10.5)
nRBC: 0 % (ref 0.0–0.2)

## 2021-07-24 LAB — GLUCOSE, CAPILLARY
Glucose-Capillary: 116 mg/dL — ABNORMAL HIGH (ref 70–99)
Glucose-Capillary: 125 mg/dL — ABNORMAL HIGH (ref 70–99)
Glucose-Capillary: 142 mg/dL — ABNORMAL HIGH (ref 70–99)
Glucose-Capillary: 146 mg/dL — ABNORMAL HIGH (ref 70–99)
Glucose-Capillary: 171 mg/dL — ABNORMAL HIGH (ref 70–99)
Glucose-Capillary: 230 mg/dL — ABNORMAL HIGH (ref 70–99)

## 2021-07-24 LAB — CULTURE, BLOOD (SINGLE)
Culture: NO GROWTH
Special Requests: ADEQUATE

## 2021-07-24 LAB — LEGIONELLA PNEUMOPHILA SEROGP 1 UR AG: L. pneumophila Serogp 1 Ur Ag: NEGATIVE

## 2021-07-24 LAB — MAGNESIUM: Magnesium: 1.8 mg/dL (ref 1.7–2.4)

## 2021-07-24 NOTE — Plan of Care (Signed)
Asked patient if he felt he needed to go on BiPAP for bed, pt declined stating it was too noisy to sleep with it on.  Pt currently on 12L HFNC sats 88-90%.  Suction set up for patients secretion management Problem: Education: Goal: Knowledge of General Education information will improve Description: Including pain rating scale, medication(s)/side effects and non-pharmacologic comfort measures Outcome: Progressing   Problem: Health Behavior/Discharge Planning: Goal: Ability to manage health-related needs will improve Outcome: Progressing   Problem: Clinical Measurements: Goal: Ability to maintain clinical measurements within normal limits will improve Outcome: Progressing Goal: Will remain free from infection Outcome: Progressing Goal: Diagnostic test results will improve Outcome: Progressing Goal: Respiratory complications will improve Outcome: Progressing Goal: Cardiovascular complication will be avoided Outcome: Progressing   Problem: Activity: Goal: Risk for activity intolerance will decrease Outcome: Progressing   Problem: Nutrition: Goal: Adequate nutrition will be maintained Outcome: Progressing   Problem: Coping: Goal: Level of anxiety will decrease Outcome: Progressing   Problem: Elimination: Goal: Will not experience complications related to bowel motility Outcome: Progressing Goal: Will not experience complications related to urinary retention Outcome: Progressing   Problem: Pain Managment: Goal: General experience of comfort will improve Outcome: Progressing   Problem: Safety: Goal: Ability to remain free from injury will improve Outcome: Progressing   Problem: Skin Integrity: Goal: Risk for impaired skin integrity will decrease Outcome: Progressing

## 2021-07-24 NOTE — Progress Notes (Signed)
RT was walking by pts room he yelled and said come in. Pt said take his bipap off because I have been on it for too long and im done with it. Removed pt from bipap and placed on [place] 10L . Pt is comfortable and sats are 93%.

## 2021-07-24 NOTE — Progress Notes (Signed)
Progress Note    [name]   [id number]  DOB: [birth year]  DOA: 07/19/2021     5 Date of Service: 07/24/2021   [name] is a 80 yo male with PMH PAF, sCHF, HCM and NSVT s/p ICD, complete heart block, COPD, HTN.  Follows outpatient with cardiology, last seen 02/28/2021. Patient was unable to provide any collateral information on admission due to unresponsive state.  His brother was called on admission and provided HPI.  He states that the patient has been becoming more slow with his mentation over the past several weeks and has had difficulty ambulating and has been stumbling/falling.  Due to no improvement, he was going to bring him in for further evaluation when he collapsed and became unresponsive on the way to the car today. He denied seeing any seizure-like activity.  On arrival to the ER the patient was found to be hypercarbic with PCO2 112.  He was placed on BiPAP.  The patient's brother also confirmed code status of DNR/DNI but wished for treatment otherwise.   After admission, he showed improvement in mentation on BiPAP and was able to be weaned off on 07/20/2021.  Subjective:  Resting in bed in no distress.  He is very unmotivated and barely engages in conversation each morning.  Discussed with him that he needs to work with physical therapy and get out of bed as well as use incentive spirometer aggressively today.  He nods his head but does not want to speak much.  Hospital Problems * Respiratory failure with hypercapnia (HCC)-resolved as of 07/24/2021 - lives with brother; no noted seizure activity; patient collapsed when walking to the car with brother on day of admission - similar hospitalization noted 06/2020 and attributed to COPD exac/CHF exac - initial ABG: 7.135/112/247  -Treated with BiPAP on admission, able to be weaned off on 07/20/2021 - confirmed DNR/DNI status with brother on admission; BiPAP use okay PRN - CT head: Atrophy, chronic microvascular  disease -Valproic acid level, 58 (normal)  Chronic systolic CHF (congestive heart failure) ([place]) - Last echo 07/05/2020: EF 40 to 45%, undetermined diastolic function - Check BNP = 3772 - CXR relatively clear; no significant volume overload appreciated; does have R>L LE edema (~2+) - continue losartan/hctz  COPD with acute exacerbation ([facility]) - s/p BiPAP - will empirically treat for exacerbation; continue steroids  - CXR repeated on 9/29; worsening opacities appreciated in RLL; continue Unasyn - no further fevers past 24 hours  - PCT negative x 3 - very little motivation; needs to get OOB and use IS   PAF (paroxysmal atrial fibrillation) ([facility]) - Awake and alert this morning - Resume amiodarone and Eliquis  Acute renal failure superimposed on stage 3a chronic kidney disease ([facility])-resolved as of 07/24/2021 - patient has history of CKD3a. Baseline creat ~ 1.2, eGFR 53 - patient presents with increase in creat >0.3 mg/dL above baseline, creat increase >1.5x baseline presumed to have occurred within past 7 days PTA - creatinine 1.63 on admission  - differential includes prerenal from poor intake vs cardiorenal vs ATN from combo - not on diuretics at home; does have LE pitting edema (patient states this is baseline) but no vascular congestion on CXR - trend renal function for now - now that awake/alert can remove foley    ICD (implantable cardioverter-defibrillator) in place - continue tele  - ICD showed normal function on last check 05/10/21, due next in mid Oct  Hypertrophic cardiomyopathy ([place]) - Follows with cardiology - Medtronic ICD in  place.  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  Vitals:   07/24/21 0600 07/24/21 0700 07/24/21 0800 07/24/21 0900  BP: 123/60 (!) 138/91  (!) 158/70  Pulse: (!) 59 (!) 59  (!) 59  Resp: (!) 25 (!) 22  (!) 26  Temp:   98.3 F (36.8 C)   TempSrc:   Oral   SpO2: 95% 98%  90%      Exam General appearance: Awake and alert; oriented to name and year Head: Normocephalic, without obvious abnormality, atraumatic Eyes:  EOMI Lungs: clear to auscultation bilaterally, mild wheezing in anterior lung fields Heart: regular rate and rhythm and S1, S2 normal Abdomen: normal findings: bowel sounds normal and soft, non-tender Extremities:  1+ R>L LE pitting edema Skin: mobility and turgor normal Neurologic: no focal deficits  Labs / Other Information My review of labs, imaging, notes and other tests is significant for RLL opacity on CXR    Time spent: Greater than 50% of the 35 minute visit was spent in counseling/coordination of care for the patient as laid out in the A&P.  Dwyane Dee, MD [facility] 07/24/2021, 11:36 AM

## 2021-07-25 DIAGNOSIS — R7989 Other specified abnormal findings of blood chemistry: Secondary | ICD-10-CM

## 2021-07-25 DIAGNOSIS — I480 Paroxysmal atrial fibrillation: Secondary | ICD-10-CM

## 2021-07-25 DIAGNOSIS — I422 Other hypertrophic cardiomyopathy: Secondary | ICD-10-CM

## 2021-07-25 DIAGNOSIS — Z95810 Presence of automatic (implantable) cardiac defibrillator: Secondary | ICD-10-CM

## 2021-07-25 DIAGNOSIS — F319 Bipolar disorder, unspecified: Secondary | ICD-10-CM

## 2021-07-25 LAB — CBC WITH DIFFERENTIAL/PLATELET
Abs Immature Granulocytes: 0.05 10*3/uL (ref 0.00–0.07)
Basophils Absolute: 0 10*3/uL (ref 0.0–0.1)
Basophils Relative: 0 %
Eosinophils Absolute: 0 10*3/uL (ref 0.0–0.5)
Eosinophils Relative: 0 %
HCT: 43.5 % (ref 39.0–52.0)
Hemoglobin: 13.8 g/dL (ref 13.0–17.0)
Immature Granulocytes: 0 %
Lymphocytes Relative: 3 %
Lymphs Abs: 0.3 10*3/uL — ABNORMAL LOW (ref 0.7–4.0)
MCH: 27.7 pg (ref 26.0–34.0)
MCHC: 31.7 g/dL (ref 30.0–36.0)
MCV: 87.3 fL (ref 80.0–100.0)
Monocytes Absolute: 0.3 10*3/uL (ref 0.1–1.0)
Monocytes Relative: 3 %
Neutro Abs: 11.3 10*3/uL — ABNORMAL HIGH (ref 1.7–7.7)
Neutrophils Relative %: 94 %
Platelets: 189 10*3/uL (ref 150–400)
RBC: 4.98 MIL/uL (ref 4.22–5.81)
RDW: 15.4 % (ref 11.5–15.5)
WBC: 12 10*3/uL — ABNORMAL HIGH (ref 4.0–10.5)
nRBC: 0 % (ref 0.0–0.2)

## 2021-07-25 LAB — BASIC METABOLIC PANEL
Anion gap: 12 (ref 5–15)
BUN: 58 mg/dL — ABNORMAL HIGH (ref 8–23)
CO2: 33 mmol/L — ABNORMAL HIGH (ref 22–32)
Calcium: 9.1 mg/dL (ref 8.9–10.3)
Chloride: 87 mmol/L — ABNORMAL LOW (ref 98–111)
Creatinine, Ser: 1.62 mg/dL — ABNORMAL HIGH (ref 0.61–1.24)
GFR, Estimated: 43 mL/min — ABNORMAL LOW (ref >60)
Glucose, Bld: 130 mg/dL — ABNORMAL HIGH (ref 70–99)
Potassium: 4.4 mmol/L (ref 3.5–5.1)
Sodium: 132 mmol/L — ABNORMAL LOW (ref 135–145)

## 2021-07-25 LAB — GLUCOSE, CAPILLARY
Glucose-Capillary: 117 mg/dL — ABNORMAL HIGH (ref 70–99)
Glucose-Capillary: 136 mg/dL — ABNORMAL HIGH (ref 70–99)
Glucose-Capillary: 150 mg/dL — ABNORMAL HIGH (ref 70–99)
Glucose-Capillary: 90 mg/dL (ref 70–99)
Glucose-Capillary: 120 mg/dL — ABNORMAL HIGH (ref 70–99)

## 2021-07-25 LAB — MAGNESIUM: Magnesium: 1.6 mg/dL — ABNORMAL LOW (ref 1.7–2.4)

## 2021-07-25 MED ORDER — IPRATROPIUM-ALBUTEROL 0.5-2.5 (3) MG/3ML IN SOLN: 3.0000 mL | RESPIRATORY_TRACT | Status: DC | PRN

## 2021-07-25 MED ORDER — MOMETASONE FURO-FORMOTEROL FUM 200-5 MCG/ACT IN AERO
2.0000 | INHALATION_SPRAY | Freq: Two times a day (BID) | RESPIRATORY_TRACT | Status: DC
  Filled 2021-07-25: qty 8.8

## 2021-07-25 MED ORDER — METHYLPREDNISOLONE SODIUM SUCC 40 MG IJ SOLR
40.0000 mg | Freq: Three times a day (TID) | INTRAMUSCULAR | Status: DC
  Administered 2021-07-25 – 2021-07-28 (×10): 40 mg via INTRAVENOUS
  Filled 2021-07-25 (×10): qty 1

## 2021-07-25 MED ORDER — IPRATROPIUM-ALBUTEROL 0.5-2.5 (3) MG/3ML IN SOLN
3.0000 mL | Freq: Four times a day (QID) | RESPIRATORY_TRACT | Status: DC
  Administered 2021-07-25 – 2021-07-27 (×7): 3 mL via RESPIRATORY_TRACT
  Filled 2021-07-25 (×7): qty 3

## 2021-07-25 MED ORDER — ARFORMOTEROL TARTRATE 15 MCG/2ML IN NEBU: 15.0000 ug | INHALATION_SOLUTION | RESPIRATORY_TRACT | Status: DC | PRN

## 2021-07-25 NOTE — Progress Notes (Signed)
Pt with increased work of breathing on 12L HFNC pursed lip breathing sats 85-89%.  RN got Respiratory therapist Amy to get patient on his BIPAP, Pt went on BiPAP for approxiamtely 30 min tonight with increased sats at 92%.  Pt did not want to leave BiPAP on. RN placed back on oxygen.  Pt in need of palliative and MD to talk about goals of care, symptom management, code status ect.

## 2021-07-25 NOTE — Progress Notes (Signed)
PROGRESS NOTE    Mike Gray  KXF:[id number] DOB: 1941/06/03 DOA: 07/19/2021 PCP: [name], MD    Brief Narrative:  [name] was admitted to the hospital with the working diagnosis of acute hypoxemic and hypercapnic respiratory failure due to COPD exacerbation/ right lower lobe pneumonia.   80 yo male with the past medical history of PAF, CHF, NSVT sp ICD, COPD and HTN. He was noted to have worsening mentation over last several weeks leading to his hospitalization, difficulty ambulating and having falls. On the day of admission he collapsed and became unresponsive. Patient DNR and DNI. In the ED his CO2 was 112 and he was placed on non invasive mechanical ventilation. On his initial physical examination his blood pressure was 103/55, HR 59, RR 16 and oxygen saturation 100% on Bipap. Lungs with no wheezing, heart S1 and S2 present and rhythmic, abdomen soft and non tender, positive lower extremity edema ++ bilaterally.   Venous pH 7,13 with Pc02 112, Pa02 247, bicarbonate 36.  Na 130, K 5,0, Cl 83, bicarb 32, BUN 51, cr 1,63 BNP 3,772 Wbc 10,1, hct 42,2 Plt 279 SARS COVID 19 negative   UA with specific gravity 1,030 with negative nitrates.   Chest film with cardiomegaly with no infiltrates.   EKG 61 bpm, right axis deviation, right bundle branch block, Qtc 526, atrial and ventricular paced rhythm, no significant St segment or T wave changes.   Continue using Bipap for ventilatory support.  Placed on steroids and bronchodilator therapy.   10/01 Ct chest with no pulmonary embolism, bilateral basal infiltrates more right than left, added antibiotic therapy for community acquired pneumonia.   Assessment & Plan:   Active Problems:   COPD with acute exacerbation (HCC)   Bipolar disorder ([place])   Chronic systolic CHF (congestive heart failure) ([place])   Hypertrophic cardiomyopathy (HCC)   ICD (implantable cardioverter-defibrillator) in place   PAF (paroxysmal atrial fibrillation)  (HCC)   Increased ammonia level   Acute hypoxemic and hypercapnic respiratory failure, COPD exacerbation, right lower lobe pneumonia.  Patient continue to have significant oxygen desaturation and dyspnea, with increased work of breathing while off Bipap.  Wbc is 12 from 15   Plan to continue intermittent Bipap ventilatory support,  Current settings 18/10 with 100%Fi02.  Systemic corticosteroids, antibiotics and bronchodilator therapy.  Target oxygen saturation 88% or greater.   2. Chronic diastolic heart failure/ hypertrophic cardiomyopathy/ sp ICD. No clinical signs of exacerbation, continue with blood pressure control.   3. Paroxysmal atrial fibrillation. Rate control with amiodarone and anticoagulation with apixaban.   4. AKI, hyponatremia, elevated ammonia level. Na is 132, K 4,4 and serum bicarbonate at 33, with serum cr at 1,62 and BUN at 33. Avoid nephrotoxic medications or hypotension. Hold on Iv fluids for now.   5. Bipolar disorder. Continue depakote.   6. HTN/Dyslipidemia.  Blood pressure stable, will hold on HCTZ and losartan in the setting of renal failure.  Continue with atorvastatin.   Patient continue to be at high risk for worsening respiratory failure   Status is: Inpatient  Remains inpatient appropriate because:Inpatient level of care appropriate due to severity of illness  Dispo: The patient is from: Home              Anticipated d/c is to: Home              Patient currently is not medically stable to d/c.   Difficult to place patient No   DVT prophylaxis: Apixaban   Code  Status:   DNR  Family Communication:  No family at the bedside      Subjective: Patient with no nausea or vomiting, this am refusing bipap but still using it. No chest pain, continue to have dyspnea and increased work of breathing while off bipap.   Objective: Vitals:   07/25/21 0332 07/25/21 0825 07/25/21 0835 07/25/21 0839  BP: 130/72 131/72    Pulse: (!) 59 (!) 59  60   Resp: 20   (!) 25  Temp: 98.9 F (37.2 C)     TempSrc:      SpO2: 92%  (!) 88% (!) 88%    Intake/Output Summary (Last 24 hours) at 07/25/2021 6314 Last data filed at 07/24/2021 2200 Gross per 24 hour  Intake 1381.49 ml  Output 600 ml  Net 781.49 ml   There were no vitals filed for this visit.  Examination:   General: deconditioned and ill looking appearing  Neurology: Awake and alert, non focal  E ENT: no pallor, no icterus, oral mucosa moist, full face mask bipap. Cardiovascular: No JVD. S1-S2 present, rhythmic, no gallops, rubs, or murmurs. No lower extremity edema. Pulmonary: positive breath sounds bilaterally, positive expiratory wheezing with rhonchi and rales. Gastrointestinal. Abdomen soft and non tender Skin. No rashes Musculoskeletal: no joint deformities     Data Reviewed: I have personally reviewed following labs and imaging studies  CBC: Recent Labs  Lab 07/21/21 0243 07/22/21 0241 07/23/21 0253 07/24/21 0313 07/25/21 0344  WBC 9.2 14.0* 12.6* 15.1* 12.0*  NEUTROABS 8.5* 13.0* 11.7* 14.2* 11.3*  HGB 12.0* 13.3 13.5 14.5 13.8  HCT 37.7* 42.5 44.6 46.1 43.5  MCV 87.9 88.7 89.6 87.8 87.3  PLT 211 203 202 214 970   Basic Metabolic Panel: Recent Labs  Lab 07/21/21 0243 07/22/21 0241 07/23/21 0253 07/24/21 0313 07/25/21 0344  NA 132* 137 139 139 132*  K 4.8 4.8 4.6 4.3 4.4  CL 87* 91* 93* 92* 87*  CO2 33* 37* 36* 37* 33*  GLUCOSE 120* 108* 145* 131* 130*  BUN 49* 45* 59* 57* 58*  CREATININE 1.34* 1.23 1.34* 1.31* 1.62*  CALCIUM 9.6 10.1 9.8 9.7 9.1  MG 2.0 2.1 2.0 1.8 1.6*   GFR: CrCl cannot be calculated (Unknown ideal weight.). Liver Function Tests: Recent Labs  Lab 07/19/21 1302  AST 43*  ALT 44  ALKPHOS 60  BILITOT 0.8  PROT 7.5  ALBUMIN 4.4   No results for input(s): LIPASE, AMYLASE in the last 168 hours. Recent Labs  Lab 07/19/21 1302 07/20/21 0238  AMMONIA 60* 53*   Coagulation Profile: Recent Labs  Lab 07/19/21 1302   INR 1.6*   Cardiac Enzymes: No results for input(s): CKTOTAL, CKMB, CKMBINDEX, TROPONINI in the last 168 hours. BNP (last 3 results) No results for input(s): PROBNP in the last 8760 hours. HbA1C: No results for input(s): HGBA1C in the last 72 hours. CBG: Recent Labs  Lab 07/24/21 0750 07/24/21 1158 07/24/21 1630 07/24/21 2053 07/25/21 0755  GLUCAP 146* 230* 116* 171* 117*   Lipid Profile: No results for input(s): CHOL, HDL, LDLCALC, TRIG, CHOLHDL, LDLDIRECT in the last 72 hours. Thyroid Function Tests: No results for input(s): TSH, T4TOTAL, FREET4, T3FREE, THYROIDAB in the last 72 hours. Anemia Panel: No results for input(s): VITAMINB12, FOLATE, FERRITIN, TIBC, IRON, RETICCTPCT in the last 72 hours.    Radiology Studies: I have reviewed all of the imaging during this hospital visit personally     Scheduled Meds:  amiodarone  100 mg Oral Daily  apixaban  5 mg Oral BID   arformoterol  15 mcg Nebulization BID   atorvastatin  20 mg Oral Daily   budesonide (PULMICORT) nebulizer solution  0.5 mg Nebulization BID   chlorhexidine  15 mL Mouth Rinse BID   divalproex  1,000 mg Oral Daily   finasteride  5 mg Oral Daily   losartan  100 mg Oral Daily   And   hydrochlorothiazide  25 mg Oral Daily   mouth rinse  15 mL Mouth Rinse q12n4p   methylPREDNISolone (SOLU-MEDROL) injection  40 mg Intravenous Q24H   sodium chloride flush  3 mL Intravenous Q12H   tamsulosin  0.4 mg Oral QPC supper   Continuous Infusions:  ampicillin-sulbactam (UNASYN) IV 3 g (07/25/21 0350)     LOS: 6 days        Mike Isaacs Gerome Apley, MD

## 2021-07-25 NOTE — Progress Notes (Signed)
Patient took bipap off again at 0935 this AM - stating he has to have a break.  Placed on 15L while patient ate breakfast and took meds.  Sats varied from 85-93% while on the [place].  Patient finished eating and was given bath .. sats then sitting aroung 85% and increase in WOB evident.  Patient now placed back on bipap with MD at bedside.  Sats increased to 89%, will continue to monitor

## 2021-07-25 NOTE — Progress Notes (Signed)
Bipap alarms sounding - pt had taken mask off wanting a break.  Patient currently on 12L HFNC with sat of 89%

## 2021-07-26 LAB — CBC WITH DIFFERENTIAL/PLATELET
Abs Immature Granulocytes: 0.09 10*3/uL — ABNORMAL HIGH (ref 0.00–0.07)
Basophils Absolute: 0 10*3/uL (ref 0.0–0.1)
Basophils Relative: 0 %
Eosinophils Absolute: 0 10*3/uL (ref 0.0–0.5)
Eosinophils Relative: 0 %
HCT: 41.9 % (ref 39.0–52.0)
Hemoglobin: 13.6 g/dL (ref 13.0–17.0)
Immature Granulocytes: 1 %
Lymphocytes Relative: 3 %
Lymphs Abs: 0.3 10*3/uL — ABNORMAL LOW (ref 0.7–4.0)
MCH: 27.6 pg (ref 26.0–34.0)
MCHC: 32.5 g/dL (ref 30.0–36.0)
MCV: 85.2 fL (ref 80.0–100.0)
Monocytes Absolute: 0.3 10*3/uL (ref 0.1–1.0)
Monocytes Relative: 3 %
Neutro Abs: 9.6 10*3/uL — ABNORMAL HIGH (ref 1.7–7.7)
Neutrophils Relative %: 93 %
Platelets: 177 10*3/uL (ref 150–400)
RBC: 4.92 MIL/uL (ref 4.22–5.81)
RDW: 14.9 % (ref 11.5–15.5)
WBC: 10.3 10*3/uL (ref 4.0–10.5)
nRBC: 0 % (ref 0.0–0.2)

## 2021-07-26 LAB — BASIC METABOLIC PANEL
Anion gap: 11 (ref 5–15)
BUN: 56 mg/dL — ABNORMAL HIGH (ref 8–23)
CO2: 32 mmol/L (ref 22–32)
Calcium: 8.8 mg/dL — ABNORMAL LOW (ref 8.9–10.3)
Chloride: 88 mmol/L — ABNORMAL LOW (ref 98–111)
Creatinine, Ser: 1.51 mg/dL — ABNORMAL HIGH (ref 0.61–1.24)
GFR, Estimated: 47 mL/min — ABNORMAL LOW (ref >60)
Glucose, Bld: 144 mg/dL — ABNORMAL HIGH (ref 70–99)
Potassium: 4.3 mmol/L (ref 3.5–5.1)
Sodium: 131 mmol/L — ABNORMAL LOW (ref 135–145)

## 2021-07-26 LAB — GLUCOSE, CAPILLARY
Glucose-Capillary: 155 mg/dL — ABNORMAL HIGH (ref 70–99)
Glucose-Capillary: 163 mg/dL — ABNORMAL HIGH (ref 70–99)
Glucose-Capillary: 209 mg/dL — ABNORMAL HIGH (ref 70–99)
Glucose-Capillary: 207 mg/dL — ABNORMAL HIGH (ref 70–99)

## 2021-07-26 LAB — MAGNESIUM: Magnesium: 1.5 mg/dL — ABNORMAL LOW (ref 1.7–2.4)

## 2021-07-26 MED ORDER — MAGNESIUM SULFATE 2 GM/50ML IV SOLN
2.0000 g | Freq: Once | INTRAVENOUS | Status: AC
  Administered 2021-07-26: 2 g via INTRAVENOUS
  Filled 2021-07-26: qty 50

## 2021-07-26 MED ORDER — MELATONIN 3 MG PO TABS
3.0000 mg | ORAL_TABLET | Freq: Once | ORAL | Status: AC
  Administered 2021-07-26: 3 mg via ORAL
  Filled 2021-07-26: qty 1

## 2021-07-26 NOTE — Progress Notes (Signed)
     Referral received for Mike Gray :goals of care discussion. Chart reviewed and discussed the patient with the primary team Dr. Cathlean Sauer. He states they have spoken with the brother and goals are clear at this time. He states it is ok to hold off on plliative involvement for now. Encouraged him to not hesitate to re-consult if PMT services are needed in the future.  Consult cancelled.  Thank you for your referral and allowing PMT to assist in [place] care.   Walden Field, NP Palliative Medicine Team Phone: [phone]  NO CHARGE

## 2021-07-26 NOTE — Care Management Important Message (Signed)
Important Message  Patient Details IM Letter placed in Patients room. Name: Mike Gray MRN: [id number] Date of Birth: December 28, 1940   Medicare Important Message Given:  Yes     Kerin Salen 07/26/2021, 1:05 PM

## 2021-07-26 NOTE — Progress Notes (Signed)
PROGRESS NOTE    Mike Gray  VVO:[id number] DOB: 11-20-1940 DOA: 07/19/2021 PCP: [name], MD    Brief Narrative:  Mike Gray was admitted to the hospital with the working diagnosis of acute hypoxemic and hypercapnic respiratory failure due to COPD exacerbation/ right lower lobe pneumonia.    80 yo male with the past medical history of PAF, CHF, NSVT sp ICD, COPD and HTN. He was noted to have worsening mentation over last several weeks leading to his hospitalization, difficulty ambulating and having falls. On the day of admission he collapsed and became unresponsive. Patient DNR and DNI. In the ED his CO2 was 112 and he was placed on non invasive mechanical ventilation. On his initial physical examination his blood pressure was 103/55, HR 59, RR 16 and oxygen saturation 100% on Bipap. Lungs with no wheezing, heart S1 and S2 present and rhythmic, abdomen soft and non tender, positive lower extremity edema ++ bilaterally.    Venous pH 7,13 with Pc02 112, Pa02 247, bicarbonate 36.  Na 130, K 5,0, Cl 83, bicarb 32, BUN 51, cr 1,63 BNP 3,772 Wbc 10,1, hct 42,2 Plt 279 SARS COVID 19 negative    UA with specific gravity 1,030 with negative nitrates.    Chest film with cardiomegaly with no infiltrates.    EKG 61 bpm, right axis deviation, right bundle branch block, Qtc 526, atrial and ventricular paced rhythm, no significant St segment or T wave changes.    Continue using Bipap for ventilatory support.  Placed on steroids and bronchodilator therapy.    10/01 Ct chest with no pulmonary embolism, bilateral basal infiltrates more right than left, added antibiotic therapy for community acquired pneumonia.    Assessment & Plan:   Active Problems:   COPD with acute exacerbation (HCC)   Bipolar disorder ([place])   Chronic systolic CHF (congestive heart failure) ([place])   Hypertrophic cardiomyopathy (HCC)   ICD (implantable cardioverter-defibrillator) in place   PAF (paroxysmal atrial  fibrillation) (HCC)   Increased ammonia level   Acute hypoxemic and hypercapnic respiratory failure, COPD exacerbation, right lower lobe pneumonia.  Continue to pulling off supplemental oxygen. Today his work of breathing has improved compared to yesterday, but continue to have severe hypoxemia.  Oxygen saturation is 89% on 15 L/min high flow nasal cannula plus non -rebreather mask.   Plan to continue bronchodilator therapy, inhaled and systemic corticosteroids (methylprednisolone 40 mg Iv tid).  Antibiotic therapy (Unasyn) and antitussive agents. Airway clearing techniques as tolerated.     2. Chronic diastolic heart failure/ hypertrophic cardiomyopathy/ sp ICD.  Patient with no signs of hypervolemia. Continue blood pressure monitoring.  No heart failure exacerbation   3. Paroxysmal atrial fibrillation.  On amiodarone for rate control and anticoagulation with apixaban.    4. AKI on CKD stage 2, hyponatremia, elevated ammonia level. Patient with poor oral intake. Na is 131, K is 4,3 and serum bicarbonate at 32, cr is 1,51 and Mg 1,5.  Plan to add 2 g Mag sulfate IV and follow up renal function in am, avoid hypotension or nephrotoxic medications.    5. Bipolar disorder. On depakote.    6. HTN/Dyslipidemia.  Continue holding HCTZ and losartan in the setting of renal failure.  On atorvastatin.    Patient continue to be at high risk for worsening respiratory failure   Status is: Inpatient  Remains inpatient appropriate because:Inpatient level of care appropriate due to severity of illness  Dispo: The patient is from: Home  Anticipated d/c is to: Home              Patient currently is not medically stable to d/c.   Difficult to place patient No  DVT prophylaxis: Enoxaparin   Code Status:    DNR   Family Communication:   I spoke over the phone with the patient's brother about patient's  condition, plan of care, prognosis and all questions were addressed.      Subjective: Patient with persistent dyspnea and oxygen desaturation, he has been pulling off supplemental 02  Objective: Vitals:   07/25/21 1900 07/25/21 2031 07/26/21 0452 07/26/21 0844  BP:  130/63 (!) 143/72   Pulse:  (!) 59 62   Resp:  (!) 24 (!) 22   Temp:  98.3 F (36.8 C) (!) 97.4 F (36.3 C)   TempSrc:  Oral Axillary   SpO2: (!) 88% 91% (!) 88% (!) 89%    Intake/Output Summary (Last 24 hours) at 07/26/2021 1126 Last data filed at 07/26/2021 0455 Gross per 24 hour  Intake 680.3 ml  Output 550 ml  Net 130.3 ml   There were no vitals filed for this visit.  Examination:   General: positive dyspnea at rest, not in pain.  Neurology: Awake and alert, responds simple questions, he has been confused per nursing at the bedside  E ENT: no pallor, no icterus, oral mucosa moist Cardiovascular: No JVD. S1-S2 present, rhythmic, no gallops, rubs, or murmurs. No lower extremity edema. Pulmonary: positive breath sounds bilaterally,  with no wheezing, scattered rhonchi and rales. Gastrointestinal. Abdomen soft and non tender Skin. No rashes Musculoskeletal: no joint deformities     Data Reviewed: I have personally reviewed following labs and imaging studies  CBC: Recent Labs  Lab 07/22/21 0241 07/23/21 0253 07/24/21 0313 07/25/21 0344 07/26/21 0353  WBC 14.0* 12.6* 15.1* 12.0* 10.3  NEUTROABS 13.0* 11.7* 14.2* 11.3* 9.6*  HGB 13.3 13.5 14.5 13.8 13.6  HCT 42.5 44.6 46.1 43.5 41.9  MCV 88.7 89.6 87.8 87.3 85.2  PLT 203 202 214 189 993   Basic Metabolic Panel: Recent Labs  Lab 07/22/21 0241 07/23/21 0253 07/24/21 0313 07/25/21 0344 07/26/21 0353  NA 137 139 139 132* 131*  K 4.8 4.6 4.3 4.4 4.3  CL 91* 93* 92* 87* 88*  CO2 37* 36* 37* 33* 32  GLUCOSE 108* 145* 131* 130* 144*  BUN 45* 59* 57* 58* 56*  CREATININE 1.23 1.34* 1.31* 1.62* 1.51*  CALCIUM 10.1 9.8 9.7 9.1 8.8*  MG 2.1 2.0 1.8 1.6* 1.5*   GFR: CrCl cannot be calculated (Unknown ideal  weight.). Liver Function Tests: Recent Labs  Lab 07/19/21 1302  AST 43*  ALT 44  ALKPHOS 60  BILITOT 0.8  PROT 7.5  ALBUMIN 4.4   No results for input(s): LIPASE, AMYLASE in the last 168 hours. Recent Labs  Lab 07/19/21 1302 07/20/21 0238  AMMONIA 60* 53*   Coagulation Profile: Recent Labs  Lab 07/19/21 1302  INR 1.6*   Cardiac Enzymes: No results for input(s): CKTOTAL, CKMB, CKMBINDEX, TROPONINI in the last 168 hours. BNP (last 3 results) No results for input(s): PROBNP in the last 8760 hours. HbA1C: No results for input(s): HGBA1C in the last 72 hours. CBG: Recent Labs  Lab 07/25/21 1211 07/25/21 1251 07/25/21 1654 07/25/21 2204 07/26/21 0715  GLUCAP 136* 150* 90 120* 155*   Lipid Profile: No results for input(s): CHOL, HDL, LDLCALC, TRIG, CHOLHDL, LDLDIRECT in the last 72 hours. Thyroid Function Tests: No results for input(s):  TSH, T4TOTAL, FREET4, T3FREE, THYROIDAB in the last 72 hours. Anemia Panel: No results for input(s): VITAMINB12, FOLATE, FERRITIN, TIBC, IRON, RETICCTPCT in the last 72 hours.    Radiology Studies: I have reviewed all of the imaging during this hospital visit personally     Scheduled Meds:  amiodarone  100 mg Oral Daily   apixaban  5 mg Oral BID   atorvastatin  20 mg Oral Daily   budesonide (PULMICORT) nebulizer solution  0.5 mg Nebulization BID   chlorhexidine  15 mL Mouth Rinse BID   divalproex  1,000 mg Oral Daily   finasteride  5 mg Oral Daily   ipratropium-albuterol  3 mL Nebulization QID   mouth rinse  15 mL Mouth Rinse q12n4p   methylPREDNISolone (SOLU-MEDROL) injection  40 mg Intravenous TID   sodium chloride flush  3 mL Intravenous Q12H   tamsulosin  0.4 mg Oral QPC supper   Continuous Infusions:  ampicillin-sulbactam (UNASYN) IV 3 g (07/26/21 1044)     LOS: 7 days        Mike Salemi Gerome Apley, MD

## 2021-07-26 NOTE — Progress Notes (Signed)
Physical Therapy Treatment Patient Details Name: Mike Gray MRN: [id number] DOB: 24-Sep-1941 Today's Date: 07/26/2021   History of Present Illness Patient is a 80 year old male admited 9/27 after collapsed when walking to the car with brother. Pt dx with Respiratory failure with hypercapnia, COPD with acute exacerbation. PMH includes PAF, sCHF, HCM and NSVT s/p ICD, complete heart block, COPD, HTN    PT Comments    Pt agreeable to mobilize.  Pt assisted to sitting EOB and required a couple minutes to rest and improve saturations.  Pt then assisted with stand pivot to recliner.  Pt currently on 15L HFNC and 15L NRB however pulls down NRB mask frequently requiring cues and assist to maintain.  SpO2 mostly remained 80-89% during session however briefly dropped to mid 70s after stand pivot.  Pt felt comfortable in recliner and agreeable to remain OOB.  RN aware pt in recliner.    Recommendations for follow up therapy are one component of a multi-disciplinary discharge planning process, led by the attending physician.  Recommendations may be updated based on patient status, additional functional criteria and insurance authorization.  Follow Up Recommendations  SNF     Equipment Recommendations  None recommended by PT    Recommendations for Other Services       Precautions / Restrictions Precautions Precautions: Fall Precaution Comments: monitor sats     Mobility  Bed Mobility Overal bed mobility: Needs Assistance Bed Mobility: Supine to Sit     Supine to sit: Mod assist     General bed mobility comments: pt initiating and utilizing rails, assist for trunk and scooting to EOB, utilized bed pad for assist/positioning; SpO2 dropped to 80% (able to improve back to upper 80s if pt leaves on NRB mask however he kept pulling mask down)    Transfers Overall transfer level: Needs assistance Equipment used: Rolling walker (2 wheeled) Transfers: Sit to/from [employer] Sit to Stand: Mod assist;+2 safety/equipment Stand pivot transfers: Min assist;+2 safety/equipment       General transfer comment: RN in room and standyby assist on pt's other side, cues for weight shifting and hand positioning; assist to rise and steady, small steps over to recliner, again SpO2 dropped to mid 70s briefly however mostly remaining in 80s (frequent cues to maintain NRB as well)  Ambulation/Gait                 Stairs             Wheelchair Mobility    Modified Rankin (Stroke Patients Only)       Balance                                            Cognition Arousal/Alertness: Awake/alert Behavior During Therapy: Flat affect Overall Cognitive Status: No family/caregiver present to determine baseline cognitive functioning                                 General Comments: agreeable to mobilize, very little verbalizations, improved mood when RN brough him his cola      Exercises      General Comments        Pertinent Vitals/Pain Pain Assessment: No/denies pain    Home Living  Prior Function            PT Goals (current goals can now be found in the care plan section) Progress towards PT goals: Progressing toward goals    Frequency    Min 2X/week      PT Plan Current plan remains appropriate    Co-evaluation              AM-PAC PT "6 Clicks" Mobility   Outcome Measure  Help needed turning from your back to your side while in a flat bed without using bedrails?: A Lot Help needed moving from lying on your back to sitting on the side of a flat bed without using bedrails?: A Lot Help needed moving to and from a bed to a chair (including a wheelchair)?: A Lot Help needed standing up from a chair using your arms (e.g., wheelchair or bedside chair)?: A Lot Help needed to walk in hospital room?: Total Help needed climbing 3-5 steps with a railing? :  Total 6 Click Score: 10    End of Session Equipment Utilized During Treatment: Gait belt;Oxygen Activity Tolerance: Patient limited by fatigue Patient left: in chair;with call bell/phone within reach;with chair alarm set Nurse Communication: Mobility status PT Visit Diagnosis: Difficulty in walking, not elsewhere classified (R26.2);Muscle weakness (generalized) (M62.81)     Time: 4462-8638 PT Time Calculation (min) (ACUTE ONLY): 20 min  Charges:  $Therapeutic Activity: 8-22 mins                     Jannette Spanner PT, DPT Acute Rehabilitation Services Pager: [phone] Office: [place] 07/26/2021, 4:02 PM

## 2021-07-26 NOTE — Progress Notes (Addendum)
Since the beginning of the shift, the patient has consistently taken off HFNC and NRB. I have explained multiple times the consequences of not keeping his oxygen in place. He states he understands but still keeps taking it off and not replacing it. Notified Olena Heckle for med for anxiety or sleep.   New order for melatonin for sleep. Given and will continue to monitor.

## 2021-07-27 LAB — BASIC METABOLIC PANEL
Anion gap: 10 (ref 5–15)
BUN: 60 mg/dL — ABNORMAL HIGH (ref 8–23)
CO2: 34 mmol/L — ABNORMAL HIGH (ref 22–32)
Calcium: 9.2 mg/dL (ref 8.9–10.3)
Chloride: 86 mmol/L — ABNORMAL LOW (ref 98–111)
Creatinine, Ser: 1.7 mg/dL — ABNORMAL HIGH (ref 0.61–1.24)
GFR, Estimated: 41 mL/min — ABNORMAL LOW (ref >60)
Glucose, Bld: 169 mg/dL — ABNORMAL HIGH (ref 70–99)
Potassium: 3.6 mmol/L (ref 3.5–5.1)
Sodium: 130 mmol/L — ABNORMAL LOW (ref 135–145)

## 2021-07-27 LAB — GLUCOSE, CAPILLARY
Glucose-Capillary: 146 mg/dL — ABNORMAL HIGH (ref 70–99)
Glucose-Capillary: 187 mg/dL — ABNORMAL HIGH (ref 70–99)
Glucose-Capillary: 175 mg/dL — ABNORMAL HIGH (ref 70–99)

## 2021-07-27 LAB — MAGNESIUM: Magnesium: 1.9 mg/dL (ref 1.7–2.4)

## 2021-07-27 MED ORDER — MELATONIN 3 MG PO TABS
3.0000 mg | ORAL_TABLET | Freq: Every day | ORAL | Status: AC
  Administered 2021-07-27 (×2): 3 mg via ORAL
  Filled 2021-07-27 (×2): qty 1

## 2021-07-27 MED ORDER — IPRATROPIUM-ALBUTEROL 0.5-2.5 (3) MG/3ML IN SOLN
3.0000 mL | Freq: Three times a day (TID) | RESPIRATORY_TRACT | Status: DC
  Administered 2021-07-27 – 2021-07-28 (×4): 3 mL via RESPIRATORY_TRACT
  Filled 2021-07-27 (×4): qty 3

## 2021-07-27 NOTE — Progress Notes (Signed)
Pt remain on 15 liters while awake O2 sats are 89-90%, when sleeping and eating O2 sats decreases to 82-83% and when coached to take deep breathes sat improve to 85-86 % on 15 liters. Will cont to monitor. SRP, RN

## 2021-07-27 NOTE — Progress Notes (Signed)
Non-re breather removed, pt on O2 at 15 liters high flow, FIO2 88%-92% =, pt alert and taking oriented times 2-3. Will cont to monitor sats and encourage IS. SRP, RN

## 2021-07-27 NOTE — Progress Notes (Addendum)
PROGRESS NOTE    Mike Gray  DHR:[id number] DOB: [birth year] DOA: 07/19/2021 PCP: [name], MD    Brief Narrative:  Mr. Ells was admitted to the hospital with the working diagnosis of acute hypoxemic and hypercapnic respiratory failure due to COPD exacerbation/ right lower lobe pneumonia.    80 yo male with the past medical history of PAF, CHF, NSVT sp ICD, COPD and HTN. He was noted to have worsening mentation over last several weeks leading to his hospitalization, difficulty ambulating and having falls. On the day of admission he collapsed and became unresponsive. Patient DNR and DNI. In the ED his CO2 was 112 and he was placed on non invasive mechanical ventilation. On his initial physical examination his blood pressure was 103/55, HR 59, RR 16 and oxygen saturation 100% on Bipap. Lungs with no wheezing, heart S1 and S2 present and rhythmic, abdomen soft and non tender, positive lower extremity edema ++ bilaterally.    Venous pH 7,13 with Pc02 112, Pa02 247, bicarbonate 36.  Na 130, K 5,0, Cl 83, bicarb 32, BUN 51, cr 1,63 BNP 3,772 Wbc 10,1, hct 42,2 Plt 279 SARS COVID 19 negative    UA with specific gravity 1,030 with negative nitrates.    Chest film with cardiomegaly with no infiltrates.    EKG 61 bpm, right axis deviation, right bundle branch block, Qtc 526, atrial and ventricular paced rhythm, no significant St segment or T wave changes.    Continue using Bipap for ventilatory support.  Placed on steroids and bronchodilator therapy.    10/01 Ct chest with no pulmonary embolism, bilateral basal infiltrates more right than left, added antibiotic therapy for community acquired pneumonia.   Slowly improving but continue to have high oxygen requirements.    Assessment & Plan:   Active Problems:   COPD with acute exacerbation (HCC)   Bipolar disorder ([place])   Chronic systolic CHF (congestive heart failure) ([place])   Hypertrophic cardiomyopathy (HCC)   ICD (implantable  cardioverter-defibrillator) in place   PAF (paroxysmal atrial fibrillation) (HCC)   Increased ammonia level   Acute hypoxemic and hypercapnic respiratory failure, COPD exacerbation, right lower lobe pneumonia.  Has been using intermittently bipap, high flow nasal cannula and non rebreather mask. This pm only on high flow nasal cannula 15 L and oxygenation is 88%. His work of breathing has improved but not yet back to baseline.   Continue bronchodilator therapy. Systemic corticosteroids (methylprednisolone 40 mg Iv tid) and inhaled steroids.  Antitussive agents and airway clearing techniques with flutter valve and incentive spirometer. Completed antibiotic therapy.     2. Chronic diastolic heart failure/ hypertrophic cardiomyopathy/ sp ICD.  No acute exacerbation.  Continue blood pressure monitoring.    3. Paroxysmal atrial fibrillation.  Continue with amiodarone and apixaban.    4. AKI on CKD stage 2, hyponatremia, elevated ammonia level. Na is 130, K 3,6 and serum bicarbonate at 34. Mg 1,7.   Renal function with serum cr at 1,70    Follow up renal function in am. Patient at home not on diuretics.    5. Bipolar disorder. Continue with depakote.    6. HTN/Dyslipidemia.  holding HCTZ and losartan to prevent hypotension or worsening renal function  Continue with atorvastatin.    Patient continue to be at high risk for worsening respiratory failure.   Status is: Inpatient  Remains inpatient appropriate because:Inpatient level of care appropriate due to severity of illness  Dispo: The patient is from: Home  Anticipated d/c is to: SNF              Patient currently is not medically stable to d/c.   Difficult to place patient No    DVT prophylaxis: Apixaban  Code Status:    DNR  Family Communication:   No family at the bedside      Subjective: Patient with improvement in work of breathing but not yet back to baseline, no nausea or vomiting, tolerating po  well.   Objective: Vitals:   07/27/21 0826 07/27/21 1247 07/27/21 1352 07/27/21 1459  BP:  (!) 116/59    Pulse:  (!) 59    Resp:  18    Temp:  98 F (36.7 C)    TempSrc:  Oral    SpO2: 90% 90%  (!) 86%  Weight:   78 kg   Height:        Intake/Output Summary (Last 24 hours) at 07/27/2021 1518 Last data filed at 07/27/2021 0338 Gross per 24 hour  Intake 403 ml  Output 400 ml  Net 3 ml   Filed Weights   07/27/21 1352  Weight: 78 kg    Examination:   General: deconditioned, positive dyspnea at rest.  Neurology: Awake and alert, non focal  E ENT: no pallor, no icterus, oral mucosa moist Cardiovascular: No JVD. S1-S2 present, rhythmic, no gallops, rubs, or murmurs. No lower extremity edema. Pulmonary: positive breath sounds bilaterally, prolonged expiration but no wheezing, scattered rhonchi or rales. Anterior auscultation  Gastrointestinal. Abdomen soft and non tender Skin. No rashes Musculoskeletal: no joint deformities     Data Reviewed: I have personally reviewed following labs and imaging studies  CBC: Recent Labs  Lab 07/22/21 0241 07/23/21 0253 07/24/21 0313 07/25/21 0344 07/26/21 0353  WBC 14.0* 12.6* 15.1* 12.0* 10.3  NEUTROABS 13.0* 11.7* 14.2* 11.3* 9.6*  HGB 13.3 13.5 14.5 13.8 13.6  HCT 42.5 44.6 46.1 43.5 41.9  MCV 88.7 89.6 87.8 87.3 85.2  PLT 203 202 214 189 353   Basic Metabolic Panel: Recent Labs  Lab 07/23/21 0253 07/24/21 0313 07/25/21 0344 07/26/21 0353 07/27/21 0351  NA 139 139 132* 131* 130*  K 4.6 4.3 4.4 4.3 3.6  CL 93* 92* 87* 88* 86*  CO2 36* 37* 33* 32 34*  GLUCOSE 145* 131* 130* 144* 169*  BUN 59* 57* 58* 56* 60*  CREATININE 1.34* 1.31* 1.62* 1.51* 1.70*  CALCIUM 9.8 9.7 9.1 8.8* 9.2  MG 2.0 1.8 1.6* 1.5* 1.9   GFR: Estimated Creatinine Clearance: 34.1 mL/min (A) (by C-G formula based on SCr of 1.7 mg/dL (H)). Liver Function Tests: No results for input(s): AST, ALT, ALKPHOS, BILITOT, PROT, ALBUMIN in the last 168  hours. No results for input(s): LIPASE, AMYLASE in the last 168 hours. No results for input(s): AMMONIA in the last 168 hours. Coagulation Profile: No results for input(s): INR, PROTIME in the last 168 hours. Cardiac Enzymes: No results for input(s): CKTOTAL, CKMB, CKMBINDEX, TROPONINI in the last 168 hours. BNP (last 3 results) No results for input(s): PROBNP in the last 8760 hours. HbA1C: No results for input(s): HGBA1C in the last 72 hours. CBG: Recent Labs  Lab 07/26/21 1130 07/26/21 1628 07/26/21 2210 07/27/21 0727 07/27/21 1104  GLUCAP 163* 209* 207* 146* 187*   Lipid Profile: No results for input(s): CHOL, HDL, LDLCALC, TRIG, CHOLHDL, LDLDIRECT in the last 72 hours. Thyroid Function Tests: No results for input(s): TSH, T4TOTAL, FREET4, T3FREE, THYROIDAB in the last 72 hours. Anemia Panel: No results  for input(s): VITAMINB12, FOLATE, FERRITIN, TIBC, IRON, RETICCTPCT in the last 72 hours.    Radiology Studies: I have reviewed all of the imaging during this hospital visit personally     Scheduled Meds:  amiodarone  100 mg Oral Daily   apixaban  5 mg Oral BID   atorvastatin  20 mg Oral Daily   budesonide (PULMICORT) nebulizer solution  0.5 mg Nebulization BID   chlorhexidine  15 mL Mouth Rinse BID   divalproex  1,000 mg Oral Daily   finasteride  5 mg Oral Daily   ipratropium-albuterol  3 mL Nebulization TID   mouth rinse  15 mL Mouth Rinse q12n4p   melatonin  3 mg Oral QHS   methylPREDNISolone (SOLU-MEDROL) injection  40 mg Intravenous TID   sodium chloride flush  3 mL Intravenous Q12H   tamsulosin  0.4 mg Oral QPC supper   Continuous Infusions:   LOS: 8 days        Jayce Boyko Gerome Apley, MD

## 2021-07-27 NOTE — Progress Notes (Signed)
Pt alert this am, oriented times 1 to 2, O2 sat 82 % in 15 liters while eating, when at rest a with non-re breather and [place], 89 % to 90 %. SRP, RN

## 2021-07-27 NOTE — Progress Notes (Signed)
Occupational Therapy Treatment Patient Details Name: Mike Gray MRN: [id number] DOB: [birth year] Today's Date: 07/27/2021   History of present illness Patient is a 80 year old male admited 9/27 after collapsed when walking to the car with brother. Pt dx with Respiratory failure with hypercapnia, COPD with acute exacerbation. PMH includes PAF, sCHF, HCM and NSVT s/p ICD, complete heart block, COPD, HTN   OT comments  Treatment focused on out of bed activity. Patient presents with flat affect and requesting food still on 15 L HFNC and NRB. Patient min assist to transfer to side of bed and min assist to stand. +2 for line and oxygen management to transfer to recliner with min guard and walker. O2 sat dropped ot 81% - though pleth signal poor and recovered quickly to 90%. Patient exhibiting improved physical abilities but still with poor activity tolerance and impaired cardiopulmonary endurance. Cont POC.    Recommendations for follow up therapy are one component of a multi-disciplinary discharge planning process, led by the attending physician.  Recommendations may be updated based on patient status, additional functional criteria and insurance authorization.    Follow Up Recommendations  SNF    Equipment Recommendations  None recommended by OT    Recommendations for Other Services      Precautions / Restrictions Precautions Precautions: Fall Precaution Comments: monitor sats, 15 L HF and 15 L NRB Restrictions Weight Bearing Restrictions: No       Mobility Bed Mobility Overal bed mobility: Needs Assistance Bed Mobility: Supine to Sit     Supine to sit: Min assist;HOB elevated     General bed mobility comments: Min assist for hand hold to pull up on to transfer to side of bed.    Transfers Overall transfer level: Needs assistance Equipment used: Rolling walker (2 wheeled) Transfers: Sit to/from [facility] Sit to Stand: Min assist;+2 safety/equipment Stand  pivot transfers: Min guard       General transfer comment: Min assist to stand to assist with power up and min guard to take short shuffling steps to recliner wth walker. +2 to manage lines/leads only    Balance Overall balance assessment: Needs assistance Sitting-balance support: No upper extremity supported Sitting balance-Leahy Scale: Fair     Standing balance support: No upper extremity supported Standing balance-Leahy Scale: Fair                             ADL either performed or assessed with clinical judgement   ADL Overall ADL's : Needs assistance/impaired Eating/Feeding: Independent Eating/Feeding Details (indicate cue type and reason): able to manage containers                                         Vision Patient Visual Report: No change from baseline     Perception     Praxis      Cognition Arousal/Alertness: Awake/alert Behavior During Therapy: Flat affect Overall Cognitive Status: Within Functional Limits for tasks assessed                                          Exercises     Shoulder Instructions       General Comments      Pertinent Vitals/ Pain  Pain Assessment: No/denies pain  Home Living                                          Prior Functioning/Environment              Frequency  Min 2X/week        Progress Toward Goals  OT Goals(current goals can now be found in the care plan section)  Progress towards OT goals: Progressing toward goals  Acute Rehab OT Goals Patient Stated Goal: did not state OT Goal Formulation: With patient Time For Goal Achievement: 08/05/21 Potential to Achieve Goals: [place] Discharge plan remains appropriate    Co-evaluation          OT goals addressed during session: Other (comment) (activity tolerance)      AM-PAC OT "6 Clicks" Daily Activity     Outcome Measure   Help from another person eating meals?:  None Help from another person taking care of personal grooming?: A Little Help from another person toileting, which includes using toliet, bedpan, or urinal?: A Lot Help from another person bathing (including washing, rinsing, drying)?: A Lot Help from another person to put on and taking off regular upper body clothing?: A Lot Help from another person to put on and taking off regular lower body clothing?: A Lot 6 Click Score: 15    End of Session Equipment Utilized During Treatment: Oxygen  OT Visit Diagnosis: Unsteadiness on feet (R26.81);Other abnormalities of gait and mobility (R26.89);Muscle weakness (generalized) (M62.81);History of falling (Z91.81);Other symptoms and signs involving cognitive function   Activity Tolerance  (limited by cardiopulmonary status)   Patient Left in chair;with call bell/phone within reach;with chair alarm set   Nurse Communication Mobility status        Time: 8676-7209 OT Time Calculation (min): 14 min  Charges: OT General Charges $OT Visit: 1 Visit OT Treatments $Therapeutic Activity: 8-22 mins  Derl Barrow, OTR/L [place]  Office [phone] Pager: [place] 07/27/2021, 9:58 AM

## 2021-07-28 ENCOUNTER — Other Ambulatory Visit (HOSPITAL_COMMUNITY): Payer: Self-pay

## 2021-07-28 ENCOUNTER — Inpatient Hospital Stay
Admission: AD | Admit: 2021-07-28 | Discharge: 2021-08-11 | Disposition: A | Discharge status: Rehabilitation Facility | Payer: Medicare Other | Source: Other Acute Inpatient Hospital

## 2021-07-28 DIAGNOSIS — Z87891 Personal history of nicotine dependence: Secondary | ICD-10-CM | POA: Diagnosis not present

## 2021-07-28 DIAGNOSIS — Z66 Do not resuscitate: Secondary | ICD-10-CM | POA: Diagnosis present

## 2021-07-28 DIAGNOSIS — I509 Heart failure, unspecified: Secondary | ICD-10-CM | POA: Diagnosis not present

## 2021-07-28 DIAGNOSIS — E785 Hyperlipidemia, unspecified: Secondary | ICD-10-CM | POA: Diagnosis present

## 2021-07-28 DIAGNOSIS — J439 Emphysema, unspecified: Secondary | ICD-10-CM | POA: Diagnosis not present

## 2021-07-28 DIAGNOSIS — B3749 Other urogenital candidiasis: Secondary | ICD-10-CM | POA: Diagnosis not present

## 2021-07-28 DIAGNOSIS — E871 Hypo-osmolality and hyponatremia: Secondary | ICD-10-CM | POA: Diagnosis not present

## 2021-07-28 DIAGNOSIS — Z6827 Body mass index (BMI) 27.0-27.9, adult: Secondary | ICD-10-CM | POA: Diagnosis not present

## 2021-07-28 DIAGNOSIS — I480 Paroxysmal atrial fibrillation: Secondary | ICD-10-CM | POA: Diagnosis present

## 2021-07-28 DIAGNOSIS — Z743 Need for continuous supervision: Secondary | ICD-10-CM | POA: Diagnosis not present

## 2021-07-28 DIAGNOSIS — J9602 Acute respiratory failure with hypercapnia: Secondary | ICD-10-CM | POA: Diagnosis present

## 2021-07-28 DIAGNOSIS — J449 Chronic obstructive pulmonary disease, unspecified: Secondary | ICD-10-CM | POA: Diagnosis not present

## 2021-07-28 DIAGNOSIS — J90 Pleural effusion, not elsewhere classified: Secondary | ICD-10-CM | POA: Diagnosis not present

## 2021-07-28 DIAGNOSIS — E46 Unspecified protein-calorie malnutrition: Secondary | ICD-10-CM | POA: Diagnosis present

## 2021-07-28 DIAGNOSIS — J181 Lobar pneumonia, unspecified organism: Secondary | ICD-10-CM

## 2021-07-28 DIAGNOSIS — J9622 Acute and chronic respiratory failure with hypercapnia: Secondary | ICD-10-CM | POA: Diagnosis not present

## 2021-07-28 DIAGNOSIS — I5032 Chronic diastolic (congestive) heart failure: Secondary | ICD-10-CM | POA: Diagnosis present

## 2021-07-28 DIAGNOSIS — I422 Other hypertrophic cardiomyopathy: Secondary | ICD-10-CM | POA: Diagnosis not present

## 2021-07-28 DIAGNOSIS — N179 Acute kidney failure, unspecified: Secondary | ICD-10-CM | POA: Diagnosis not present

## 2021-07-28 DIAGNOSIS — I5022 Chronic systolic (congestive) heart failure: Secondary | ICD-10-CM | POA: Diagnosis present

## 2021-07-28 DIAGNOSIS — R531 Weakness: Secondary | ICD-10-CM | POA: Diagnosis present

## 2021-07-28 DIAGNOSIS — I517 Cardiomegaly: Secondary | ICD-10-CM | POA: Diagnosis not present

## 2021-07-28 DIAGNOSIS — R7989 Other specified abnormal findings of blood chemistry: Secondary | ICD-10-CM | POA: Diagnosis not present

## 2021-07-28 DIAGNOSIS — J189 Pneumonia, unspecified organism: Secondary | ICD-10-CM | POA: Diagnosis not present

## 2021-07-28 DIAGNOSIS — J441 Chronic obstructive pulmonary disease with (acute) exacerbation: Secondary | ICD-10-CM | POA: Diagnosis present

## 2021-07-28 DIAGNOSIS — I130 Hypertensive heart and chronic kidney disease with heart failure and stage 1 through stage 4 chronic kidney disease, or unspecified chronic kidney disease: Secondary | ICD-10-CM | POA: Diagnosis present

## 2021-07-28 DIAGNOSIS — I429 Cardiomyopathy, unspecified: Secondary | ICD-10-CM | POA: Diagnosis present

## 2021-07-28 DIAGNOSIS — J9600 Acute respiratory failure, unspecified whether with hypoxia or hypercapnia: Secondary | ICD-10-CM | POA: Diagnosis not present

## 2021-07-28 DIAGNOSIS — N182 Chronic kidney disease, stage 2 (mild): Secondary | ICD-10-CM | POA: Diagnosis present

## 2021-07-28 DIAGNOSIS — Z95810 Presence of automatic (implantable) cardiac defibrillator: Secondary | ICD-10-CM | POA: Diagnosis not present

## 2021-07-28 DIAGNOSIS — J440 Chronic obstructive pulmonary disease with acute lower respiratory infection: Secondary | ICD-10-CM | POA: Diagnosis present

## 2021-07-28 DIAGNOSIS — J9601 Acute respiratory failure with hypoxia: Secondary | ICD-10-CM | POA: Diagnosis present

## 2021-07-28 DIAGNOSIS — F319 Bipolar disorder, unspecified: Secondary | ICD-10-CM | POA: Diagnosis present

## 2021-07-28 LAB — BASIC METABOLIC PANEL
Anion gap: 10 (ref 5–15)
BUN: 57 mg/dL — ABNORMAL HIGH (ref 8–23)
CO2: 36 mmol/L — ABNORMAL HIGH (ref 22–32)
Calcium: 9.5 mg/dL (ref 8.9–10.3)
Chloride: 89 mmol/L — ABNORMAL LOW (ref 98–111)
Creatinine, Ser: 1.77 mg/dL — ABNORMAL HIGH (ref 0.61–1.24)
GFR, Estimated: 39 mL/min — ABNORMAL LOW (ref >60)
Glucose, Bld: 133 mg/dL — ABNORMAL HIGH (ref 70–99)
Potassium: 4.5 mmol/L (ref 3.5–5.1)
Sodium: 135 mmol/L (ref 135–145)

## 2021-07-28 LAB — GLUCOSE, CAPILLARY
Glucose-Capillary: 121 mg/dL — ABNORMAL HIGH (ref 70–99)
Glucose-Capillary: 146 mg/dL — ABNORMAL HIGH (ref 70–99)
Glucose-Capillary: 151 mg/dL — ABNORMAL HIGH (ref 70–99)

## 2021-07-28 LAB — MAGNESIUM: Magnesium: 1.9 mg/dL (ref 1.7–2.4)

## 2021-07-28 MED ORDER — AMIODARONE HCL 100 MG PO TABS: 100.0000 mg | ORAL_TABLET | Freq: Every day | ORAL | Status: DC

## 2021-07-28 MED ORDER — METHYLPREDNISOLONE SODIUM SUCC 40 MG IJ SOLR: 40.0000 mg | Freq: Three times a day (TID) | INTRAMUSCULAR | 0 refills | Status: DC

## 2021-07-28 MED ORDER — BUDESONIDE 0.5 MG/2ML IN SUSP: 0.5000 mg | Freq: Two times a day (BID) | RESPIRATORY_TRACT | 0 refills | Status: DC

## 2021-07-28 MED ORDER — IPRATROPIUM-ALBUTEROL 0.5-2.5 (3) MG/3ML IN SOLN: 3.0000 mL | RESPIRATORY_TRACT | Status: DC | PRN

## 2021-07-28 MED ORDER — IPRATROPIUM-ALBUTEROL 0.5-2.5 (3) MG/3ML IN SOLN: 3.0000 mL | Freq: Three times a day (TID) | RESPIRATORY_TRACT | Status: DC

## 2021-07-28 NOTE — Progress Notes (Signed)
RN received report from CN that central telemetry had reported an 8-beat run of V-Tach at 1706. RN reported to MD via text page and in person.   No further orders at this time; pt has ICD.

## 2021-07-28 NOTE — TOC Transition Note (Addendum)
Transition of Care [facility]) - CM/SW Discharge Note   Patient Details  Name: Mike Gray MRN: [id number] Date of Birth: 1941/04/10  [facility] CM/SW Contact:  Ross Ludwig, LCSW Phone Number: 07/28/2021, 2:55 PM   Clinical Narrative:     CSW met with patient to see if he would be interested in LTAC due to his high oxygen needs and other medical diagnoses.  Patient was explained about [facility], [facility], patient requested to have CSW speak to his brother.  CSW contacted patient's brother and talked to him about [facility].  [name] agreed to have patient go to [facility].  CSW contacted Lorenza Burton at [facility]	[place], and she said he qualifies and can be accepted today if he is medically ready for discharge.  CSW notified attending physician, bedside nurse, and charge nurse.  Receiving physician is Dr. Satira Sark.  Patient to be d/c'ed today to [facility].  Patient and family agreeable to plans will transport via [place], RN to call report to room 24, [phone].  [name] [name] [phone] requested to be notified once patient is being picked up for discharge.  CSW signing off, please reconsult with any other TOC needs.     Final next level of care: Long Term Acute Care (LTAC) Barriers to Discharge: Barriers Resolved   Patient Goals and CMS Choice Patient states their goals for this hospitalization and ongoing recovery are:: To go to LTAC then eventually return back home. CMS Medicare.gov Compare Post Acute Care list provided to:: Patient Represenative (must comment) Choice offered to / list presented to : Patient, Sibling  Discharge Placement                Patient to be transferred to facility by: [place] Name of family member notified: Brother Mike Gray Patient and family notified of of transfer: 07/28/21  Discharge Plan and Services   Discharge Planning Services: CM Consult                                  Social Determinants of Health (SDOH) Interventions     Readmission Risk Interventions No flowsheet data found.

## 2021-07-28 NOTE — Plan of Care (Signed)

## 2021-07-28 NOTE — Progress Notes (Signed)
RN gave handoff report to receiving Earnest Bailey, Therapist, sports and answered all questions.

## 2021-07-28 NOTE — Discharge Summary (Addendum)
Physician Discharge Summary  Mike Gray WVP:[id number] DOB: 02/14/1941 DOA: 07/19/2021  PCP: Mike Amel, MD  Admit date: 07/19/2021 Discharge date: 07/28/2021  Admitted From: Home  Disposition:  LTAC  Recommendations for Outpatient Follow-up and new medication changes:  Follow up with Dr Mike Gray in 7 to 10 days after discharge from [facility] systemic corticosteroids and bronchodilator therapy. Target 02 saturation 88% or greater.   I spoke over the phone with the patient's brother about patient's  condition, plan of care, prognosis and all questions were addressed.   Home Health:na   Equipment/Devices: na    Discharge Condition: stable  CODE STATUS: Partial (OK for non invasive mechanical ventilation) Diet recommendation:  regular as tolerated.   Brief/Interim Summary: Mike Gray was admitted to the hospital with the working diagnosis of acute hypoxemic and hypercapnic respiratory failure due to COPD exacerbation/ right lower lobe pneumonia.    80 yo male with the past medical history of PAF, CHF, NSVT sp ICD, COPD and HTN. He was noted to have worsening mentation over last several weeks leading to his hospitalization, difficulty ambulating and having falls. On the day of admission he collapsed and became unresponsive. Patient DNR and DNI. In the ED his CO2 was 112 and he was placed on non invasive mechanical ventilation. On his initial physical examination his blood pressure was 103/55, HR 59, RR 16 and oxygen saturation 100% on Bipap. Lungs with no wheezing, heart S1 and S2 present and rhythmic, abdomen soft and non tender, positive lower extremity edema ++ bilaterally.    Venous pH 7,13 with Pc02 112, Pa02 247, bicarbonate 36.  Na 130, K 5,0, Cl 83, bicarb 32, BUN 51, cr 1,63 BNP 3,772 Wbc 10,1, hct 42,2 Plt 279 SARS COVID 19 negative    UA with specific gravity 1,030 with negative nitrates.    Chest film with cardiomegaly with no infiltrates.    EKG 61 bpm, right axis  deviation, right bundle branch block, Qtc 526, atrial and ventricular paced rhythm, no significant St segment or T wave changes.    Patient was placed on continuos Bipap for ventilatory support.  Placed on steroids and bronchodilator therapy.    10/01 Ct chest with no pulmonary embolism, bilateral basal infiltrates more right than left, added antibiotic therapy for community acquired pneumonia.    Slowly improving but continue to have high oxygen requirements.  Now liberated of non invasive mechanical ventilation, but continue to use high flow nasal cannula and non rebreather mask to keep oxygen saturation 88% or greater.  Transferred to LTAC to continue pulmonary support.   Acute hypoxemic and hypercapnic respiratory failure, COPD exacerbation, right lower lobe pneumonia.  Clinically patient has slowly improved but not back to his baseline.  He continues to have high oxygen demands to keep oxygen saturation 88% or greater.  He has been successfully liberated from noninvasive mechanical ventilation, but continues to use 15 L per high flow nasal cannula plus nonrebreather mask. Is very weak and deconditioned, poor oral intake.  Patient will benefit from further pulmonary rehabilitation at long-term acute care facility. Continue on systemic corticosteroids, methylprednisolone 40 mg IV 3 times daily along with inhaled corticosteroids. Aggressive bronchodilator therapy and airway clearing techniques. Patient has completed antibiotic therapy during this hospitalization.  2.  Chronic diastolic heart failure, hypertrophic cardiomyopathy, status post AICD.  No signs of acute heart failure exacerbation, continue blood pressure monitoring.  3.  Paroxysmal atrial fibrillation.  Continue rate control with amiodarone and anticoagulation with apixaban.  4.  Acute kidney injury on chronic kidney disease stage 2, hyponatremia and elevated ammonia level. His serum creatinine is 1.77, potassium 4.5 and  bicarb 36. He has no signs of volume overload. He has poor oral intake. Consider intravenous fluids if continue worsening serum creatinine.  5.  Bipolar disorder.  Continue Depakote.  No agitation or confusion.  6.  Hypertension.  Dyslipidemia.  Holding antihypertensive agents, at home patient on hydrochlorothiazide and losartan to prevent worsening kidney function. Continue statin therapy.  Discharge Diagnoses:  Active Problems:   COPD with acute exacerbation (HCC)   Bipolar disorder ([place])   Chronic systolic CHF (congestive heart failure) ([place])   Hypertrophic cardiomyopathy (HCC)   ICD (implantable cardioverter-defibrillator) in place   PAF (paroxysmal atrial fibrillation) (HCC)   Increased ammonia level    Discharge Instructions   Allergies as of 07/28/2021   No Known Allergies      Medication List     STOP taking these medications    ADVIL PM PO   albuterol (2.5 MG/3ML) 0.083% nebulizer solution Commonly known as: PROVENTIL   losartan-hydrochlorothiazide 100-25 MG tablet Commonly known as: HYZAAR   metoprolol 200 MG 24 hr tablet Commonly known as: TOPROL-XL   Symbicort 160-4.5 MCG/ACT inhaler Generic drug: budesonide-formoterol       TAKE these medications    amiodarone 100 MG tablet Commonly known as: PACERONE Take 1 tablet (100 mg total) by mouth daily. Start taking on: July 29, 2021 What changed:  medication strength See the new instructions.   apixaban 5 MG Tabs tablet Commonly known as: Eliquis Take 1 tablet (5 mg total) by mouth 2 (two) times daily.   atorvastatin 20 MG tablet Commonly known as: LIPITOR Take 20 mg by mouth daily.   budesonide 0.5 MG/2ML nebulizer solution Commonly known as: PULMICORT Take 2 mLs (0.5 mg total) by nebulization 2 (two) times daily.   divalproex 500 MG 24 hr tablet Commonly known as: DEPAKOTE ER Take 1,000 mg by mouth daily.   finasteride 5 MG tablet Commonly known as: PROSCAR Take 5 mg by mouth  daily.   ipratropium-albuterol 0.5-2.5 (3) MG/3ML Soln Commonly known as: DUONEB Take 3 mLs by nebulization 3 (three) times daily.   ipratropium-albuterol 0.5-2.5 (3) MG/3ML Soln Commonly known as: DUONEB Take 3 mLs by nebulization every 2 (two) hours as needed.   methylPREDNISolone sodium succinate 40 mg/mL injection Commonly known as: SOLU-MEDROL Inject 1 mL (40 mg total) into the vein 3 (three) times daily.   tamsulosin 0.4 MG Caps capsule Commonly known as: FLOMAX Take 1 capsule (0.4 mg total) by mouth daily after supper. What changed: how much to take        No Known Allergies     Procedures/Studies: CT HEAD WO CONTRAST (5MM)  Result Date: 07/19/2021 CLINICAL DATA:  Mental status changes. EXAM: CT HEAD WITHOUT CONTRAST TECHNIQUE: Contiguous axial images were obtained from the base of the skull through the vertex without intravenous contrast. COMPARISON:  07/05/2020 FINDINGS: Brain: There is atrophy and chronic small vessel disease changes. No acute intracranial abnormality. Specifically, no hemorrhage, hydrocephalus, mass lesion, acute infarction, or significant intracranial injury. Vascular: No hyperdense vessel or unexpected calcification. Skull: No acute calvarial abnormality. Sinuses/Orbits: No acute findings Other: None IMPRESSION: Atrophy, chronic microvascular disease. No acute intracranial abnormality. Electronically Signed   By: Rolm Baptise M.D.   On: 07/19/2021 15:25   CT Angio Chest Pulmonary Embolism (PE) W or WO Contrast  Result Date: 07/23/2021 CLINICAL DATA:  Worsening hypoxia and lethargy. Altered mental  status. Clinical suspicion for pulmonary embolism. EXAM: CT ANGIOGRAPHY CHEST WITH CONTRAST TECHNIQUE: Multidetector CT imaging of the chest was performed using the standard protocol during bolus administration of intravenous contrast. Multiplanar CT image reconstructions and MIPs were obtained to evaluate the vascular anatomy. CONTRAST:  178mL OMNIPAQUE  IOHEXOL 350 MG/ML SOLN COMPARISON:  07/04/2020 FINDINGS: Cardiovascular: Satisfactory opacification of pulmonary arteries noted, and no pulmonary emboli identified. No evidence of thoracic aortic dissection or aneurysm. Moderate cardiomegaly noted with pacemaker leads. Aortic and coronary atherosclerotic calcification noted. Mediastinum/Nodes: No masses or pathologically enlarged lymph nodes identified. Lungs/Pleura: Bilateral lower lobe consolidation and volume loss are seen. Atelectasis also seen in the inferior aspect of the right middle lobe. Tiny bilateral pleural effusions also noted. Moderate centrilobular emphysema. No evidence of central endobronchial obstruction. Upper abdomen: No acute findings. Musculoskeletal: No suspicious bone lesions identified. Review of the MIP images confirms the above findings. IMPRESSION: No evidence of pulmonary embolism. Bilateral lower lobe consolidation and volume loss, suspicious for pneumonia. Right middle lobe atelectasis. Tiny bilateral pleural effusions. Moderate cardiomegaly. Aortic Atherosclerosis (ICD10-I70.0) and Emphysema (ICD10-J43.9). Electronically Signed   By: Marlaine Hind M.D.   On: 07/23/2021 18:07   DG CHEST PORT 1 VIEW  Result Date: 07/22/2021 CLINICAL DATA:  Pleural rub EXAM: PORTABLE CHEST 1 VIEW COMPARISON:  07/21/2021 FINDINGS: Left AICD remains in place, unchanged. Heart is borderline in size. Vascular congestion. Bilateral infrahilar/lower lobe opacities. No visible effusions or pneumothorax. IMPRESSION: Vascular congestion with infrahilar atelectasis or infiltrate/edema. Electronically Signed   By: Rolm Baptise M.D.   On: 07/22/2021 07:13   DG CHEST PORT 1 VIEW  Result Date: 07/21/2021 CLINICAL DATA:  COPD exacerbation.  Cough EXAM: PORTABLE CHEST 1 VIEW COMPARISON:  07/19/2021 FINDINGS: Interval development of right lower lobe density which may be due to effusion and airspace disease. Possible pneumonia or atelectasis. Negative for heart  failure or edema. Left lung clear. Pacemaker with multiple leads unchanged from the prior study. IMPRESSION: Progressive right lower lobe airspace disease and probable effusion. Question pneumonia symptoms Electronically Signed   By: Franchot Gallo M.D.   On: 07/21/2021 14:08   DG Chest Portable 1 View  Result Date: 07/19/2021 CLINICAL DATA:  COPD.  Dyspnea.  CHF. EXAM: PORTABLE CHEST 1 VIEW COMPARISON:  05/30/2021 FINDINGS: Pacer/AICD device. Midline trachea. Moderate cardiomegaly. Atherosclerosis in the transverse aorta. No pleural effusion or pneumothorax. No congestive failure. Clear lungs. IMPRESSION: Cardiomegaly, without congestive failure or acute disease. Aortic Atherosclerosis (ICD10-I70.0). Electronically Signed   By: Abigail Miyamoto M.D.   On: 07/19/2021 13:49       Subjective: Patient continue to have dyspnea at rest, has been off Bipap with good toleration. No nausea or vomiting, poor oral intake.   Discharge Exam: Vitals:   07/28/21 1200 07/28/21 1353  BP: (!) 108/50   Pulse: 60   Resp: (!) 24   Temp: 98.7 F (37.1 C)   SpO2: (!) 89% 90%   Vitals:   07/28/21 0439 07/28/21 0819 07/28/21 1200 07/28/21 1353  BP: (!) 129/43  (!) 108/50   Pulse: 60  60   Resp: 18  (!) 24   Temp: 98 F (36.7 C)  98.7 F (37.1 C)   TempSrc: Oral  Axillary   SpO2: (!) 89% 90% (!) 89% 90%  Weight:      Height:        General: deconditioned and ill looking appearing  Neurology: Awake and alert, non focal  E ENT: no pallor, no icterus, oral mucosa moist  Cardiovascular: No JVD. S1-S2 present, rhythmic, no gallops, rubs, or murmurs. No lower extremity edema. Pulmonary: positive breath sounds bilaterally, with wheezing, scattered rhonchi and rales. Gastrointestinal. Abdomen soft and non tender Skin. No rashes Musculoskeletal: no joint deformities   The results of significant diagnostics from this hospitalization (including imaging, microbiology, ancillary and laboratory) are listed below  for reference.     Microbiology: Recent Results (from the past 240 hour(s))  Blood culture (routine single)     Status: None   Collection Time: 07/19/21  1:02 PM   Specimen: BLOOD  Result Value Ref Range Status   Specimen Description BLOOD RIGHT ANTECUBITAL  Final   Special Requests   Final    BOTTLES DRAWN AEROBIC AND ANAEROBIC Blood Culture adequate volume   Culture   Final    NO GROWTH 5 DAYS Performed at [facility], [address] [place], [place] [address]    Report Status 07/24/2021 FINAL  Final  Urine Culture     Status: None   Collection Time: 07/19/21  2:30 PM   Specimen: Urine, Random  Result Value Ref Range Status   Specimen Description   Final    URINE, RANDOM Performed at [place] [address] [place], [place] [address]    Special Requests   Final    NONE Performed at [facility], [place] [address], [place], [place] [address]    Culture   Final    NO GROWTH Performed at [facility], [place] [address], [place], [place] [address]    Report Status 07/20/2021 FINAL  Final  MRSA Next Gen by PCR, Nasal     Status: None   Collection Time: 07/19/21  4:54 PM   Specimen: Nasal Mucosa; Nasal Swab  Result Value Ref Range Status   MRSA by PCR Next Gen NOT DETECTED NOT DETECTED Final    Comment: (NOTE) The GeneXpert MRSA Assay (FDA approved for NASAL specimens only), is one component of a comprehensive MRSA colonization surveillance program. It is not intended to diagnose MRSA infection nor to guide or monitor treatment for MRSA infections. Test performance is not FDA approved in patients less than 34 years old. Performed at [facility], [place] [address], [place],  [address]   Resp Panel by RT-PCR (Flu A&B, Covid) Nasopharyngeal Swab     Status: None   Collection Time: 07/21/21  8:19 AM   Specimen: Nasopharyngeal Swab; Nasopharyngeal(NP) swabs in vial transport medium  Result Value Ref  Range Status   SARS Coronavirus 2 by RT PCR NEGATIVE NEGATIVE Final    Comment: (NOTE) SARS-CoV-2 target nucleic acids are NOT DETECTED.  The SARS-CoV-2 RNA is generally detectable in upper respiratory specimens during the acute phase of infection. The lowest concentration of SARS-CoV-2 viral copies this assay can detect is 138 copies/mL. A negative result does not preclude SARS-Cov-2 infection and should not be used as the sole basis for treatment or other patient management decisions. A negative result may occur with  improper specimen collection/handling, submission of specimen other than nasopharyngeal swab, presence of viral mutation(s) within the areas targeted by this assay, and inadequate number of viral copies(<138 copies/mL). A negative result must be combined with clinical observations, patient history, and epidemiological information. The expected result is Negative.  Fact Sheet for Patients:  EntrepreneurPulse.com.au  Fact Sheet for Healthcare Providers:  IncredibleEmployment.be  This test is no t yet approved or cleared by the [place] FDA and  has been  authorized for detection and/or diagnosis of SARS-CoV-2 by FDA under an Emergency Use Authorization (EUA). This EUA will remain  in effect (meaning this test can be used) for the duration of the COVID-19 declaration under Section 564(b)(1) of the Act, 21 U.S.C.section 360bbb-3(b)(1), unless the authorization is terminated  or revoked sooner.       Influenza A by PCR NEGATIVE NEGATIVE Final   Influenza B by PCR NEGATIVE NEGATIVE Final    Comment: (NOTE) The Xpert Xpress SARS-CoV-2/FLU/RSV plus assay is intended as an aid in the diagnosis of influenza from Nasopharyngeal swab specimens and should not be used as a sole basis for treatment. Nasal washings and aspirates are unacceptable for Xpert Xpress SARS-CoV-2/FLU/RSV testing.  Fact Sheet for  Patients: EntrepreneurPulse.com.au  Fact Sheet for Healthcare Providers: IncredibleEmployment.be  This test is not yet approved or cleared by the [place] FDA and has been authorized for detection and/or diagnosis of SARS-CoV-2 by FDA under an Emergency Use Authorization (EUA). This EUA will remain in effect (meaning this test can be used) for the duration of the COVID-19 declaration under Section 564(b)(1) of the Act, 21 U.S.C. section 360bbb-3(b)(1), unless the authorization is terminated or revoked.  Performed at [facility], [place] [address] [place], [place] [address]   Expectorated Sputum Assessment w Gram Stain, Rflx to Resp Cult     Status: None   Collection Time: 07/22/21 11:52 AM   Specimen: Expectorated Sputum  Result Value Ref Range Status   Specimen Description EXPECTORATED SPUTUM  Final   Special Requests NONE  Final   Sputum evaluation   Final    Sputum specimen not acceptable for testing.  Please recollect.   H,COCHRAN @1333  ON 07/22/21 BY P,LUZOLO Performed at [facility], [place] [address] [place], [place] [address]    Report Status 07/22/2021 FINAL  Final     Labs: BNP (last 3 results) Recent Labs    07/19/21 1302  BNP 2,706.2*   Basic Metabolic Panel: Recent Labs  Lab 07/24/21 0313 07/25/21 0344 07/26/21 0353 07/27/21 0351 07/28/21 0347  NA 139 132* 131* 130* 135  K 4.3 4.4 4.3 3.6 4.5  CL 92* 87* 88* 86* 89*  CO2 37* 33* 32 34* 36*  GLUCOSE 131* 130* 144* 169* 133*  BUN 57* 58* 56* 60* 57*  CREATININE 1.31* 1.62* 1.51* 1.70* 1.77*  CALCIUM 9.7 9.1 8.8* 9.2 9.5  MG 1.8 1.6* 1.5* 1.9 1.9   Liver Function Tests: No results for input(s): AST, ALT, ALKPHOS, BILITOT, PROT, ALBUMIN in the last 168 hours. No results for input(s): LIPASE, AMYLASE in the last 168 hours. No results for input(s): AMMONIA in the last 168 hours. CBC: Recent Labs  Lab 07/22/21 0241  07/23/21 0253 07/24/21 0313 07/25/21 0344 07/26/21 0353  WBC 14.0* 12.6* 15.1* 12.0* 10.3  NEUTROABS 13.0* 11.7* 14.2* 11.3* 9.6*  HGB 13.3 13.5 14.5 13.8 13.6  HCT 42.5 44.6 46.1 43.5 41.9  MCV 88.7 89.6 87.8 87.3 85.2  PLT 203 202 214 189 177   Cardiac Enzymes: No results for input(s): CKTOTAL, CKMB, CKMBINDEX, TROPONINI in the last 168 hours. BNP: Invalid input(s): POCBNP CBG: Recent Labs  Lab 07/27/21 0727 07/27/21 1104 07/27/21 2133 07/28/21 0717 07/28/21 1112  GLUCAP 146* 187* 175* 121* 146*   D-Dimer No results for input(s): DDIMER in the last 72 hours. Hgb A1c No results for input(s): HGBA1C in the last 72 hours. Lipid Profile No results for input(s): CHOL, HDL, LDLCALC, TRIG, CHOLHDL, LDLDIRECT in the  last 72 hours. Thyroid function studies No results for input(s): TSH, T4TOTAL, T3FREE, THYROIDAB in the last 72 hours.  Invalid input(s): FREET3 Anemia work up No results for input(s): VITAMINB12, FOLATE, FERRITIN, TIBC, IRON, RETICCTPCT in the last 72 hours. Urinalysis    Component Value Date/Time   COLORURINE YELLOW 07/19/2021 1430   APPEARANCEUR CLEAR 07/19/2021 1430   LABSPEC 1.030 07/19/2021 1430   PHURINE 6.0 07/19/2021 1430   GLUCOSEU NEGATIVE 07/19/2021 1430   HGBUR NEGATIVE 07/19/2021 1430   [place] 07/19/2021 1430   [place] 07/19/2021 1430   PROTEINUR NEGATIVE 07/19/2021 1430   NITRITE NEGATIVE 07/19/2021 1430   LEUKOCYTESUR NEGATIVE 07/19/2021 1430   Sepsis Labs Invalid input(s): PROCALCITONIN,  WBC,  LACTICIDVEN Microbiology Recent Results (from the past 240 hour(s))  Blood culture (routine single)     Status: None   Collection Time: 07/19/21  1:02 PM   Specimen: BLOOD  Result Value Ref Range Status   Specimen Description BLOOD RIGHT ANTECUBITAL  Final   Special Requests   Final    BOTTLES DRAWN AEROBIC AND ANAEROBIC Blood Culture adequate volume   Culture   Final    NO GROWTH 5 DAYS Performed at [facility], [address] [place], [place] [address]    Report Status 07/24/2021 FINAL  Final  Urine Culture     Status: None   Collection Time: 07/19/21  2:30 PM   Specimen: Urine, Random  Result Value Ref Range Status   Specimen Description   Final    URINE, RANDOM Performed at [place] [address] [place] [address] [place], [place] [address]    Special Requests   Final    NONE Performed at [facility], [place] [address] [place], [place] [address]    Culture   Final    NO GROWTH Performed at [facility], [place] [address] [place], [place] [address]    Report Status 07/20/2021 FINAL  Final  MRSA Next Gen by PCR, Nasal     Status: None   Collection Time: 07/19/21  4:54 PM   Specimen: Nasal Mucosa; Nasal Swab  Result Value Ref Range Status   MRSA by PCR Next Gen NOT DETECTED NOT DETECTED Final    Comment: (NOTE) The GeneXpert MRSA Assay (FDA approved for NASAL specimens only), is one component of a comprehensive MRSA colonization surveillance program. It is not intended to diagnose MRSA infection nor to guide or monitor treatment for MRSA infections. Test performance is not FDA approved in patients less than 49 years old. Performed at [facility], [place] [address] [place], [place] [address]   Resp Panel by RT-PCR (Flu A&B, Covid) Nasopharyngeal Swab     Status: None   Collection Time: 07/21/21  8:19 AM   Specimen: Nasopharyngeal Swab; Nasopharyngeal(NP) swabs in vial transport medium  Result Value Ref Range Status   SARS Coronavirus 2 by RT PCR NEGATIVE NEGATIVE Final    Comment: (NOTE) SARS-CoV-2 target nucleic acids are NOT DETECTED.  The SARS-CoV-2 RNA is generally detectable in upper respiratory specimens during the acute phase of infection. The lowest concentration of SARS-CoV-2 viral copies this assay can detect is 138 copies/mL. A negative result does not preclude SARS-Cov-2 infection and should not be used as the  sole basis for treatment or other patient management decisions. A negative result may occur with  improper specimen collection/handling, submission of specimen other than nasopharyngeal swab, presence of viral mutation(s) within the areas targeted by this  assay, and inadequate number of viral copies(<138 copies/mL). A negative result must be combined with clinical observations, patient history, and epidemiological information. The expected result is Negative.  Fact Sheet for Patients:  EntrepreneurPulse.com.au  Fact Sheet for Healthcare Providers:  IncredibleEmployment.be  This test is no t yet approved or cleared by the [place] FDA and  has been authorized for detection and/or diagnosis of SARS-CoV-2 by FDA under an Emergency Use Authorization (EUA). This EUA will remain  in effect (meaning this test can be used) for the duration of the COVID-19 declaration under Section 564(b)(1) of the Act, 21 U.S.C.section 360bbb-3(b)(1), unless the authorization is terminated  or revoked sooner.       Influenza A by PCR NEGATIVE NEGATIVE Final   Influenza B by PCR NEGATIVE NEGATIVE Final    Comment: (NOTE) The Xpert Xpress SARS-CoV-2/FLU/RSV plus assay is intended as an aid in the diagnosis of influenza from Nasopharyngeal swab specimens and should not be used as a sole basis for treatment. Nasal washings and aspirates are unacceptable for Xpert Xpress SARS-CoV-2/FLU/RSV testing.  Fact Sheet for Patients: EntrepreneurPulse.com.au  Fact Sheet for Healthcare Providers: IncredibleEmployment.be  This test is not yet approved or cleared by the [place] FDA and has been authorized for detection and/or diagnosis of SARS-CoV-2 by FDA under an Emergency Use Authorization (EUA). This EUA will remain in effect (meaning this test can be used) for the duration of the COVID-19 declaration under Section 564(b)(1) of the  Act, 21 U.S.C. section 360bbb-3(b)(1), unless the authorization is terminated or revoked.  Performed at [facility], [place] [address] [place], [place] [address]   Expectorated Sputum Assessment w Gram Stain, Rflx to Resp Cult     Status: None   Collection Time: 07/22/21 11:52 AM   Specimen: Expectorated Sputum  Result Value Ref Range Status   Specimen Description EXPECTORATED SPUTUM  Final   Special Requests NONE  Final   Sputum evaluation   Final    Sputum specimen not acceptable for testing.  Please recollect.   H,COCHRAN @1333  ON 07/22/21 BY P,LUZOLO Performed at [facility], [place] [address] [place], [place] [address]    Report Status 07/22/2021 FINAL  Final     Time coordinating discharge: 45 minutes  SIGNED:   Tawni Millers, MD  [facility] 07/28/2021, 2:26 PM

## 2021-07-29 LAB — EXPECTORATED SPUTUM ASSESSMENT W GRAM STAIN, RFLX TO RESP C

## 2021-07-29 LAB — COMPREHENSIVE METABOLIC PANEL
ALT: 140 U/L — ABNORMAL HIGH (ref 0–44)
AST: 60 U/L — ABNORMAL HIGH (ref 15–41)
Albumin: 2.2 g/dL — ABNORMAL LOW (ref 3.5–5.0)
Alkaline Phosphatase: 46 U/L (ref 38–126)
Anion gap: 9 (ref 5–15)
BUN: 56 mg/dL — ABNORMAL HIGH (ref 8–23)
CO2: 35 mmol/L — ABNORMAL HIGH (ref 22–32)
Calcium: 8.9 mg/dL (ref 8.9–10.3)
Chloride: 87 mmol/L — ABNORMAL LOW (ref 98–111)
Creatinine, Ser: 1.62 mg/dL — ABNORMAL HIGH (ref 0.61–1.24)
GFR, Estimated: 43 mL/min — ABNORMAL LOW (ref >60)
Glucose, Bld: 121 mg/dL — ABNORMAL HIGH (ref 70–99)
Potassium: 4.2 mmol/L (ref 3.5–5.1)
Sodium: 131 mmol/L — ABNORMAL LOW (ref 135–145)
Total Bilirubin: 0.5 mg/dL (ref 0.3–1.2)
Total Protein: 4.4 g/dL — ABNORMAL LOW (ref 6.5–8.1)

## 2021-07-29 LAB — URINALYSIS, ROUTINE W REFLEX MICROSCOPIC
Bilirubin Urine: NEGATIVE
Glucose, UA: NEGATIVE mg/dL
Hgb urine dipstick: NEGATIVE
Ketones, ur: 5 mg/dL — AB
Leukocytes,Ua: NEGATIVE
Nitrite: NEGATIVE
Protein, ur: NEGATIVE mg/dL
Specific Gravity, Urine: 1.015 (ref 1.005–1.030)
pH: 6 (ref 5.0–8.0)

## 2021-07-29 LAB — CBC
HCT: 38.8 % — ABNORMAL LOW (ref 39.0–52.0)
Hemoglobin: 12.9 g/dL — ABNORMAL LOW (ref 13.0–17.0)
MCH: 27.4 pg (ref 26.0–34.0)
MCHC: 33.2 g/dL (ref 30.0–36.0)
MCV: 82.4 fL (ref 80.0–100.0)
Platelets: 207 10*3/uL (ref 150–400)
RBC: 4.71 MIL/uL (ref 4.22–5.81)
RDW: 14.6 % (ref 11.5–15.5)
WBC: 16.1 10*3/uL — ABNORMAL HIGH (ref 4.0–10.5)
nRBC: 0 % (ref 0.0–0.2)

## 2021-07-29 LAB — TSH: TSH: 0.512 u[IU]/mL (ref 0.350–4.500)

## 2021-07-29 LAB — LIPID PANEL
Cholesterol: 87 mg/dL (ref 0–200)
HDL: 34 mg/dL — ABNORMAL LOW (ref >40)
LDL Cholesterol: 40 mg/dL (ref 0–99)
Total CHOL/HDL Ratio: 2.6 RATIO
Triglycerides: 63 mg/dL (ref <150)
VLDL: 13 mg/dL (ref 0–40)

## 2021-07-30 ENCOUNTER — Encounter (HOSPITAL_BASED_OUTPATIENT_CLINIC_OR_DEPARTMENT_OTHER): Payer: Self-pay

## 2021-07-30 DIAGNOSIS — R609 Edema, unspecified: Secondary | ICD-10-CM

## 2021-07-30 NOTE — Progress Notes (Signed)
VASCULAR LAB    Left upper extremity venous duplex has been performed.  See CV proc for preliminary results.   Gianna Calef, RVT 07/30/2021, 6:06 PM

## 2021-07-31 LAB — CULTURE, RESPIRATORY W GRAM STAIN

## 2021-07-31 LAB — URINE CULTURE: Culture: 20000 — AB

## 2021-07-31 LAB — RENAL FUNCTION PANEL
Albumin: 2.3 g/dL — ABNORMAL LOW (ref 3.5–5.0)
Anion gap: 7 (ref 5–15)
BUN: 51 mg/dL — ABNORMAL HIGH (ref 8–23)
CO2: 34 mmol/L — ABNORMAL HIGH (ref 22–32)
Calcium: 8.9 mg/dL (ref 8.9–10.3)
Chloride: 93 mmol/L — ABNORMAL LOW (ref 98–111)
Creatinine, Ser: 1.46 mg/dL — ABNORMAL HIGH (ref 0.61–1.24)
GFR, Estimated: 49 mL/min — ABNORMAL LOW (ref >60)
Glucose, Bld: 161 mg/dL — ABNORMAL HIGH (ref 70–99)
Phosphorus: 2.9 mg/dL (ref 2.5–4.6)
Potassium: 4.4 mmol/L (ref 3.5–5.1)
Sodium: 134 mmol/L — ABNORMAL LOW (ref 135–145)

## 2021-07-31 LAB — AMMONIA: Ammonia: 24 umol/L (ref 9–35)

## 2021-07-31 LAB — HEMOGLOBIN A1C
Hgb A1c MFr Bld: 6.2 % — ABNORMAL HIGH (ref 4.8–5.6)
Mean Plasma Glucose: 131.24 mg/dL

## 2021-08-01 DIAGNOSIS — I480 Paroxysmal atrial fibrillation: Secondary | ICD-10-CM

## 2021-08-01 DIAGNOSIS — J9622 Acute and chronic respiratory failure with hypercapnia: Secondary | ICD-10-CM

## 2021-08-01 DIAGNOSIS — J449 Chronic obstructive pulmonary disease, unspecified: Secondary | ICD-10-CM

## 2021-08-01 DIAGNOSIS — I5022 Chronic systolic (congestive) heart failure: Secondary | ICD-10-CM

## 2021-08-01 DIAGNOSIS — J181 Lobar pneumonia, unspecified organism: Secondary | ICD-10-CM

## 2021-08-01 NOTE — Consult Note (Signed)
Pulmonary [place]  PULMONARY SERVICE  Date of Service: 08/01/2021  PULMONARY CRITICAL CARE CONSULT   Mike Gray  [id number]:[id number]  DOB: 1941/10/23   DOA: 07/28/2021  Referring Physician: Satira Sark, MD  HPI: Mike Gray is a 80 y.o. male seen for follow up of Acute on Chronic Respiratory Failure.  Patient with multiple medical problems including atrial fibrillation CHF COPD GI bleed hypertension came into the hospital with altered mental status.  Patient had apparently been having difficulty ambulating and also having falls.  In the emergency department patient was noted to have a PCO2 of 112 and so therefore was placed on noninvasive ventilation.  Should be noted the patient is DNR and DNI.  Hospital course showed slow steady improvement and is now on high flow oxygen and 40 to 50 L.  Transferred to our facility for further management  Review of Systems:  ROS performed and is unremarkable other than noted above.  Past Medical History:  Diagnosis Date   Angiodysplasia    Atrial fibrillation (HCC)    CHF (congestive heart failure) (HCC)    COPD (chronic obstructive pulmonary disease) (HCC)    GI bleed    Hypertension     Past Surgical History:  Procedure Laterality Date   COLONOSCOPY     PACEMAKER IMPLANT      Social History:    reports that he quit smoking about 9 years ago. His smoking use included cigarettes. He has never used smokeless tobacco. He reports that he does not currently use alcohol. He reports that he does not use drugs.  Family History: Non-Contributory to the present illness  No Known Allergies  Medications: Reviewed on Rounds  Physical Exam:  Vitals: The temperature was 98.6 blood pressure 103/50 heart rate 62 saturations were 98%  Ventilator Settings off of the ventilator on high flow oxygen  General: Comfortable at this time Eyes: Grossly normal lids, irises & conjunctiva ENT: grossly  tongue is normal Neck: no obvious mass Cardiovascular: S1-S2 normal no gallop or rub Respiratory: Coarse rhonchi are noted bilaterally Abdomen: Soft nontender Skin: no rash seen on limited exam Musculoskeletal: not rigid Psychiatric:unable to assess Neurologic: no seizure no involuntary movements         Labs on Admission:  Basic Metabolic Panel: Recent Labs  Lab 07/26/21 0353 07/27/21 0351 07/28/21 0347 07/29/21 0354 07/31/21 0345  NA 131* 130* 135 131* 134*  K 4.3 3.6 4.5 4.2 4.4  CL 88* 86* 89* 87* 93*  CO2 32 34* 36* 35* 34*  GLUCOSE 144* 169* 133* 121* 161*  BUN 56* 60* 57* 56* 51*  CREATININE 1.51* 1.70* 1.77* 1.62* 1.46*  CALCIUM 8.8* 9.2 9.5 8.9 8.9  MG 1.5* 1.9 1.9  --   --   PHOS  --   --   --   --  2.9    No results for input(s): PHART, PCO2ART, PO2ART, HCO3, O2SAT in the last 168 hours.  Liver Function Tests: Recent Labs  Lab 07/29/21 0354 07/31/21 0345  AST 60*  --   ALT 140*  --   ALKPHOS 46  --   BILITOT 0.5  --   PROT 4.4*  --   ALBUMIN 2.2* 2.3*   No results for input(s): LIPASE, AMYLASE in the last 168 hours. Recent Labs  Lab 07/31/21 0345  AMMONIA 24    CBC: Recent Labs  Lab 07/26/21 0353 07/29/21 0354  WBC 10.3 16.1*  NEUTROABS 9.6*  --   HGB 13.6  12.9*  HCT 41.9 38.8*  MCV 85.2 82.4  PLT 177 207    Cardiac Enzymes: No results for input(s): CKTOTAL, CKMB, CKMBINDEX, TROPONINI in the last 168 hours.  BNP (last 3 results) Recent Labs    07/19/21 1302  BNP 3,772.3*    ProBNP (last 3 results) No results for input(s): PROBNP in the last 8760 hours.   Radiological Exams on Admission: DG CHEST PORT 1 VIEW  Result Date: 07/28/2021 CLINICAL DATA:  COPD EXAM: PORTABLE CHEST 1 VIEW COMPARISON:  07/22/2021 FINDINGS: Paucity of vasculature within the lung apices is in keeping with underlying emphysema, better appreciated prior CT examination of 07/23/2021. Small right pleural effusion. No pneumothorax. Stable right basilar  consolidation, better appreciated on prior CT examination. Mild cardiomegaly is stable. 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 Pulmonary vascularity is unremarkable. IMPRESSION: Stable small right pleural effusion and associated right basilar consolidation. Stable cardiomegaly. Electronically Signed   By: Fidela Salisbury M.D.   On: 07/28/2021 21:27   VAS [place] UPPER EXTREMITY VENOUS DUPLEX  Result Date: 08/01/2021 UPPER VENOUS STUDY  Patient Name:  Mike Gray  Date of Exam:   07/30/2021 Medical Rec #: [id number]      Accession #:    [id number] Date of Birth: 02-Jun-1941     Patient Gender: M Patient Age:   34 years Exam Location[facility]  [facility] Procedure:      VAS [place] UPPER EXTREMITY VENOUS DUPLEX Referring Phys: --------------------------------------------------------------------------------  Indications: Edema Limitations: Pitting edema. Comparison Study: No prior study on file Performing Technologist: Mike Gray RVS  Examination Guidelines: A complete evaluation includes B-mode imaging, spectral Doppler, color Doppler, and power Doppler as needed of all accessible portions of each vessel. Bilateral testing is considered an integral part of a complete examination. Limited examinations for reoccurring indications may be performed as noted.  Right Findings: +----------+------------+---------+-----------+----------+-------+ RIGHT     CompressiblePhasicitySpontaneousPropertiesSummary +----------+------------+---------+-----------+----------+-------+ Subclavian               Yes       Yes                      +----------+------------+---------+-----------+----------+-------+  Left Findings: +----------+------------+---------+-----------+----------+---------------+ LEFT      CompressiblePhasicitySpontaneousProperties    Summary     +----------+------------+---------+-----------+----------+---------------+ IJV           Full       Yes       Yes                               +----------+------------+---------+-----------+----------+---------------+ Subclavian               Yes       Yes                              +----------+------------+---------+-----------+----------+---------------+ Axillary      Full       Yes       Yes                              +----------+------------+---------+-----------+----------+---------------+ Brachial      Full                                                  +----------+------------+---------+-----------+----------+---------------+  Radial        Full                                                  +----------+------------+---------+-----------+----------+---------------+ Ulnar                                               patent by color +----------+------------+---------+-----------+----------+---------------+ Cephalic      Full                                                  +----------+------------+---------+-----------+----------+---------------+ Basilic       Full                                                  +----------+------------+---------+-----------+----------+---------------+  Summary:  Right: No evidence of thrombosis in the subclavian.  Left: No evidence of deep vein thrombosis in the upper extremity. No evidence of superficial vein thrombosis in the upper extremity.  *See table(s) above for measurements and observations.  Diagnosing physician: Orlie Pollen Electronically signed by Orlie Pollen on 08/01/2021 at 6:51:53 AM.    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  Patient had been on steroids.  If there is no significant improvement would consider discontinuing amiodarone consider cardiology evaluation Chronic diastolic heart failure supportive care patient has had an AICD placed Severe COPD history of COPD with exacerbation nebulizers as needed we will continue to monitor closely. Lobar pneumonia treated with antibiotics we will continue with current management patient has been treated as already indicated.  I have personally seen and evaluated the patient, evaluated laboratory and imaging results, formulated the assessment and plan and placed orders. The Patient requires high complexity decision making with multiple systems involvement.  Case was discussed on Rounds with the Respiratory Therapy Director and the Respiratory staff Time Spent 31minutes  Miran Kautzman A Marji Kuehnel, MD [facility] Pulmonary Critical Care Medicine Sleep Medicine

## 2021-08-02 ENCOUNTER — Other Ambulatory Visit (HOSPITAL_COMMUNITY): Payer: Self-pay

## 2021-08-02 DIAGNOSIS — J181 Lobar pneumonia, unspecified organism: Secondary | ICD-10-CM

## 2021-08-02 LAB — CBC
HCT: 35.6 % — ABNORMAL LOW (ref 39.0–52.0)
Hemoglobin: 11.8 g/dL — ABNORMAL LOW (ref 13.0–17.0)
MCH: 27.7 pg (ref 26.0–34.0)
MCHC: 33.1 g/dL (ref 30.0–36.0)
MCV: 83.6 fL (ref 80.0–100.0)
Platelets: 182 10*3/uL (ref 150–400)
RBC: 4.26 MIL/uL (ref 4.22–5.81)
RDW: 15.1 % (ref 11.5–15.5)
WBC: 14 10*3/uL — ABNORMAL HIGH (ref 4.0–10.5)
nRBC: 0 % (ref 0.0–0.2)

## 2021-08-02 LAB — RENAL FUNCTION PANEL
Albumin: 2.1 g/dL — ABNORMAL LOW (ref 3.5–5.0)
Anion gap: 7 (ref 5–15)
BUN: 39 mg/dL — ABNORMAL HIGH (ref 8–23)
CO2: 30 mmol/L (ref 22–32)
Calcium: 8.5 mg/dL — ABNORMAL LOW (ref 8.9–10.3)
Chloride: 95 mmol/L — ABNORMAL LOW (ref 98–111)
Creatinine, Ser: 1.2 mg/dL (ref 0.61–1.24)
GFR, Estimated: >60 mL/min (ref >60)
Glucose, Bld: 147 mg/dL — ABNORMAL HIGH (ref 70–99)
Phosphorus: 3.4 mg/dL (ref 2.5–4.6)
Potassium: 4.2 mmol/L (ref 3.5–5.1)
Sodium: 132 mmol/L — ABNORMAL LOW (ref 135–145)

## 2021-08-02 LAB — MAGNESIUM: Magnesium: 1.5 mg/dL — ABNORMAL LOW (ref 1.7–2.4)

## 2021-08-02 NOTE — Progress Notes (Signed)
Pulmonary Critical Care Medicine [place]  PROGRESS NOTE     Shalamar Crays  [id number]:[id number]  DOB: Oct 03, 1941   DOA: 07/28/2021  Referring Physician: Satira Sark, MD  HPI: Kwadwo Taras is a 80 y.o. male being followed for ventilator/airway/oxygen weaning Acute on Chronic Respiratory Failure.  Patient currently is on high flow oxygen on 45 L requiring 40% FiO2  Medications: Reviewed on Rounds  Physical Exam:  Vitals: Temperature is 96.3 pulse 63 respiratory rate is 12 blood pressure 127/67 saturations 98%  Ventilator Settings on 45 L 40% FiO2  General: Comfortable at this time Neck: supple Cardiovascular: no malignant arrhythmias Respiratory: Coarse breath sounds with a few scattered rhonchi Skin: no rash seen on limited exam Musculoskeletal: No gross abnormality Psychiatric:unable to assess Neurologic:no involuntary movements         Lab Data:   Basic Metabolic Panel: Recent Labs  Lab 07/27/21 0351 07/28/21 0347 07/29/21 0354 07/31/21 0345 08/02/21 0401  NA 130* 135 131* 134* 132*  K 3.6 4.5 4.2 4.4 4.2  CL 86* 89* 87* 93* 95*  CO2 34* 36* 35* 34* 30  GLUCOSE 169* 133* 121* 161* 147*  BUN 60* 57* 56* 51* 39*  CREATININE 1.70* 1.77* 1.62* 1.46* 1.20  CALCIUM 9.2 9.5 8.9 8.9 8.5*  MG 1.9 1.9  --   --  1.5*  PHOS  --   --   --  2.9 3.4    ABG: No results for input(s): PHART, PCO2ART, PO2ART, HCO3, O2SAT in the last 168 hours.  Liver Function Tests: Recent Labs  Lab 07/29/21 0354 07/31/21 0345 08/02/21 0401  AST 60*  --   --   ALT 140*  --   --   ALKPHOS 46  --   --   BILITOT 0.5  --   --   PROT 4.4*  --   --   ALBUMIN 2.2* 2.3* 2.1*   No results for input(s): LIPASE, AMYLASE in the last 168 hours. Recent Labs  Lab 07/31/21 0345  AMMONIA 24    CBC: Recent Labs  Lab 07/29/21 0354 08/02/21 0401  WBC 16.1* 14.0*  HGB 12.9* 11.8*  HCT 38.8* 35.6*  MCV 82.4 83.6  PLT 207 182     Cardiac Enzymes: No results for input(s): CKTOTAL, CKMB, CKMBINDEX, TROPONINI in the last 168 hours.  BNP (last 3 results) Recent Labs    07/19/21 1302  BNP 3,772.3*    ProBNP (last 3 results) No results for input(s): PROBNP in the last 8760 hours.  Radiological Exams: DG CHEST PORT 1 VIEW  Result Date: 08/02/2021 CLINICAL DATA:  Pneumonia and CHF. EXAM: PORTABLE CHEST 1 VIEW COMPARISON:  Chest CT 07/23/2021 and chest x-ray 07/28/2021 FINDINGS: Stable pacer wires. Mild cardiac enlargement is unchanged. Mild tortuosity and calcification of the thoracic aorta. Persistent but improved bibasilar airspace consolidation, left greater than right. Small left effusion is also present. IMPRESSION: Persistent but improved bibasilar airspace consolidation. Electronically Signed   By: Marijo Sanes M.D.   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)   PAF (paroxysmal atrial fibrillation) (HCC)   Lobar pneumonia (HCC)   Acute on chronic respiratory failure with hypoxia we will continue with oxygen therapy titrate down as tolerated has shown some improvement COPD severe disease we will continue with supportive care nebulizers  as needed Paroxysmal atrial fibrillation right now rate is controlled Lobar pneumonia treated slow improvement Chronic systolic heart failure supportive care we will continue to monitor fluid status   I have personally seen and evaluated the patient, evaluated laboratory and imaging results, formulated the assessment and plan and placed orders. The Patient requires high complexity decision making with multiple systems involvement.  Rounds were done with the Respiratory Therapy Director and Staff therapists and discussed with nursing staff also.  Allyne Gee, MD [facility] Pulmonary Critical Care  Medicine Sleep Medicine

## 2021-08-03 DIAGNOSIS — I480 Paroxysmal atrial fibrillation: Secondary | ICD-10-CM | POA: Diagnosis not present

## 2021-08-03 DIAGNOSIS — J449 Chronic obstructive pulmonary disease, unspecified: Secondary | ICD-10-CM | POA: Diagnosis not present

## 2021-08-03 DIAGNOSIS — I5022 Chronic systolic (congestive) heart failure: Secondary | ICD-10-CM | POA: Diagnosis not present

## 2021-08-03 DIAGNOSIS — J181 Lobar pneumonia, unspecified organism: Secondary | ICD-10-CM | POA: Diagnosis not present

## 2021-08-03 DIAGNOSIS — J9622 Acute and chronic respiratory failure with hypercapnia: Secondary | ICD-10-CM | POA: Diagnosis not present

## 2021-08-03 LAB — MAGNESIUM: Magnesium: 2.1 mg/dL (ref 1.7–2.4)

## 2021-08-03 LAB — BASIC METABOLIC PANEL
Anion gap: 5 (ref 5–15)
BUN: 40 mg/dL — ABNORMAL HIGH (ref 8–23)
CO2: 33 mmol/L — ABNORMAL HIGH (ref 22–32)
Calcium: 8.3 mg/dL — ABNORMAL LOW (ref 8.9–10.3)
Chloride: 91 mmol/L — ABNORMAL LOW (ref 98–111)
Creatinine, Ser: 1.27 mg/dL — ABNORMAL HIGH (ref 0.61–1.24)
GFR, Estimated: 57 mL/min — ABNORMAL LOW (ref >60)
Glucose, Bld: 163 mg/dL — ABNORMAL HIGH (ref 70–99)
Potassium: 4.6 mmol/L (ref 3.5–5.1)
Sodium: 129 mmol/L — ABNORMAL LOW (ref 135–145)

## 2021-08-03 NOTE — Progress Notes (Signed)
Pulmonary Critical Care Medicine [place]  PROGRESS NOTE     Bastian Andreoli  [id number]  DOB: 1941-03-10   DOA: 07/28/2021  Referring Physician: Satira Sark, MD  HPI: Tanvir Hipple is a 80 y.o. male being followed for ventilator/airway/oxygen weaning Acute on Chronic Respiratory Failure.  Patient is on 45 L and 40% FiO2 however saturations were excellent  Medications: Reviewed on Rounds  Physical Exam:  Vitals: Temperature is 98.0 pulse 109 respiratory rate is 20 blood pressure is 103/59 saturations 94%  Ventilator Settings on high flow oxygen 45 L and 40% FiO2  General: Comfortable at this time Neck: supple Cardiovascular: no malignant arrhythmias Respiratory: Coarse rhonchi expansion is equal Skin: no rash seen on limited exam Musculoskeletal: No gross abnormality Psychiatric:unable to assess Neurologic:no involuntary movements         Lab Data:   Basic Metabolic Panel: Recent Labs  Lab 07/28/21 0347 07/29/21 0354 07/31/21 0345 08/02/21 0401 08/03/21 0333  NA 135 131* 134* 132* 129*  K 4.5 4.2 4.4 4.2 4.6  CL 89* 87* 93* 95* 91*  CO2 36* 35* 34* 30 33*  GLUCOSE 133* 121* 161* 147* 163*  BUN 57* 56* 51* 39* 40*  CREATININE 1.77* 1.62* 1.46* 1.20 1.27*  CALCIUM 9.5 8.9 8.9 8.5* 8.3*  MG 1.9  --   --  1.5* 2.1  PHOS  --   --  2.9 3.4  --     ABG: No results for input(s): PHART, PCO2ART, PO2ART, HCO3, O2SAT in the last 168 hours.  Liver Function Tests: Recent Labs  Lab 07/29/21 0354 07/31/21 0345 08/02/21 0401  AST 60*  --   --   ALT 140*  --   --   ALKPHOS 46  --   --   BILITOT 0.5  --   --   PROT 4.4*  --   --   ALBUMIN 2.2* 2.3* 2.1*   No results for input(s): LIPASE, AMYLASE in the last 168 hours. Recent Labs  Lab 07/31/21 0345  AMMONIA 24    CBC: Recent Labs  Lab 07/29/21 0354 08/02/21 0401  WBC 16.1* 14.0*  HGB 12.9* 11.8*  HCT 38.8* 35.6*  MCV 82.4 83.6  PLT  207 182    Cardiac Enzymes: No results for input(s): CKTOTAL, CKMB, CKMBINDEX, TROPONINI in the last 168 hours.  BNP (last 3 results) Recent Labs    07/19/21 1302  BNP 3,772.3*    ProBNP (last 3 results) No results for input(s): PROBNP in the last 8760 hours.  Radiological Exams: DG CHEST PORT 1 VIEW  Result Date: 08/02/2021 CLINICAL DATA:  Pneumonia and CHF. EXAM: PORTABLE CHEST 1 VIEW COMPARISON:  Chest CT 07/23/2021 and chest x-ray 07/28/2021 FINDINGS: Stable pacer wires. Mild cardiac enlargement is unchanged. Mild tortuosity and calcification of the thoracic aorta. Persistent but improved bibasilar airspace consolidation, left greater than right. Small left effusion is also present. IMPRESSION: Persistent but improved bibasilar airspace consolidation. Electronically Signed   By: Marijo Sanes M.D.   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  Since saturations are good try to wean FiO2 down  COPD exacerbation supportive care we will continue to follow along closely Chronic systolic heart failure compensated Paroxysmal atrial fibrillation right now rate is controlled Lobar pneumonia treated   I have personally seen and evaluated the patient, evaluated laboratory and imaging results, formulated the assessment and plan and placed orders. The Patient requires high complexity decision making with multiple systems involvement.  Rounds were done with the Respiratory Therapy Director and Staff therapists and discussed with nursing staff also.  Allyne Gee, MD [facility] Pulmonary Critical Care Medicine Sleep  Medicine

## 2021-08-04 NOTE — Progress Notes (Signed)
Pulmonary Critical Care Medicine [place]  PROGRESS NOTE     Daeshawn Redmann  PVV:[id number]  DOB: [birth year]   DOA: 07/28/2021  Referring Physician: Satira Sark, MD  HPI: Harshal Sirmon is a 80 y.o. male being followed for ventilator/airway/oxygen weaning Acute on Chronic Respiratory Failure.  We have been able to continue to wean him he is actually now off oxygen on room air doing very well with excellent saturations  Medications: Reviewed on Rounds  Physical Exam:  Vitals: Temperature 98.0 pulse 62 respiratory is 26 blood pressure is 133/68 saturations 98%  Ventilator Settings off the oxygen now on room air  General: Comfortable at this time Neck: supple Cardiovascular: no malignant arrhythmias Respiratory: Coarse breath sounds with few scattered rhonchi Skin: no rash seen on limited exam Musculoskeletal: No gross abnormality Psychiatric:unable to assess Neurologic:no involuntary movements         Lab Data:   Basic Metabolic Panel: Recent Labs  Lab 07/29/21 0354 07/31/21 0345 08/02/21 0401 08/03/21 0333  NA 131* 134* 132* 129*  K 4.2 4.4 4.2 4.6  CL 87* 93* 95* 91*  CO2 35* 34* 30 33*  GLUCOSE 121* 161* 147* 163*  BUN 56* 51* 39* 40*  CREATININE 1.62* 1.46* 1.20 1.27*  CALCIUM 8.9 8.9 8.5* 8.3*  MG  --   --  1.5* 2.1  PHOS  --  2.9 3.4  --     ABG: No results for input(s): PHART, PCO2ART, PO2ART, HCO3, O2SAT in the last 168 hours.  Liver Function Tests: Recent Labs  Lab 07/29/21 0354 07/31/21 0345 08/02/21 0401  AST 60*  --   --   ALT 140*  --   --   ALKPHOS 46  --   --   BILITOT 0.5  --   --   PROT 4.4*  --   --   ALBUMIN 2.2* 2.3* 2.1*   No results for input(s): LIPASE, AMYLASE in the last 168 hours. Recent Labs  Lab 07/31/21 0345  AMMONIA 24    CBC: Recent Labs  Lab 07/29/21 0354 08/02/21 0401  WBC 16.1* 14.0*  HGB 12.9* 11.8*  HCT 38.8* 35.6*  MCV 82.4 83.6  PLT 207  182    Cardiac Enzymes: No results for input(s): CKTOTAL, CKMB, CKMBINDEX, TROPONINI in the last 168 hours.  BNP (last 3 results) Recent Labs    07/19/21 1302  BNP 3,772.3*    ProBNP (last 3 results) No results for input(s): PROBNP in the last 8760 hours.  Radiological Exams: No results found.  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)   PAF (paroxysmal atrial fibrillation) (HCC)   Lobar pneumonia (HCC)   Acute on chronic respiratory failure with hypoxia patient at this time has been weaned off the oxygen looks good plan is to continue with the weaning process and supportive care COPD now at baseline Chronic systolic heart failure appears to be compensated we will monitor closely Paroxysmal atrial fibrillation rate is controlled at this time Lobar pneumonia treated we will monitor   I have personally seen and evaluated the patient, evaluated laboratory and imaging results, formulated the assessment and plan and placed orders. The Patient requires high complexity decision making with multiple systems involvement.  Rounds were done with the Respiratory Therapy Director and Staff therapists and discussed with nursing staff also.  Kasy Iannacone A  Humphrey Rolls, MD [facility] Pulmonary Critical Care Medicine Sleep Medicine

## 2021-08-05 ENCOUNTER — Telehealth: Payer: Self-pay | Admitting: Cardiology

## 2021-08-05 NOTE — Telephone Encounter (Signed)
Remote for 10/18 cancelled patient scheduled to F/U with Any Tillery on 08/31/21

## 2021-08-05 NOTE — Telephone Encounter (Signed)
Brother of patient called. The patient is currently admitted to the hospital and has been for two weeks. The brother thinks he may be in the hospital for at least one more week, possibly two.  The patient is due to send a remote transmission 08/09/21. The patient will not be able to send one at that time

## 2021-08-06 LAB — RENAL FUNCTION PANEL
Albumin: 2.4 g/dL — ABNORMAL LOW (ref 3.5–5.0)
Anion gap: 10 (ref 5–15)
BUN: 41 mg/dL — ABNORMAL HIGH (ref 8–23)
CO2: 26 mmol/L (ref 22–32)
Calcium: 8.7 mg/dL — ABNORMAL LOW (ref 8.9–10.3)
Chloride: 94 mmol/L — ABNORMAL LOW (ref 98–111)
Creatinine, Ser: 1.25 mg/dL — ABNORMAL HIGH (ref 0.61–1.24)
GFR, Estimated: 58 mL/min — ABNORMAL LOW (ref >60)
Glucose, Bld: 200 mg/dL — ABNORMAL HIGH (ref 70–99)
Phosphorus: 3.8 mg/dL (ref 2.5–4.6)
Potassium: 4.7 mmol/L (ref 3.5–5.1)
Sodium: 130 mmol/L — ABNORMAL LOW (ref 135–145)

## 2021-08-06 LAB — CBC
HCT: 38 % — ABNORMAL LOW (ref 39.0–52.0)
Hemoglobin: 12.5 g/dL — ABNORMAL LOW (ref 13.0–17.0)
MCH: 27.5 pg (ref 26.0–34.0)
MCHC: 32.9 g/dL (ref 30.0–36.0)
MCV: 83.5 fL (ref 80.0–100.0)
Platelets: 161 10*3/uL (ref 150–400)
RBC: 4.55 MIL/uL (ref 4.22–5.81)
RDW: 17.1 % — ABNORMAL HIGH (ref 11.5–15.5)
WBC: 14.9 10*3/uL — ABNORMAL HIGH (ref 4.0–10.5)
nRBC: 0 % (ref 0.0–0.2)

## 2021-08-06 LAB — MAGNESIUM: Magnesium: 1.8 mg/dL (ref 1.7–2.4)

## 2021-08-08 LAB — COMPREHENSIVE METABOLIC PANEL
ALT: 32 U/L (ref 0–44)
AST: 29 U/L (ref 15–41)
Albumin: 2.2 g/dL — ABNORMAL LOW (ref 3.5–5.0)
Alkaline Phosphatase: 52 U/L (ref 38–126)
Anion gap: 7 (ref 5–15)
BUN: 41 mg/dL — ABNORMAL HIGH (ref 8–23)
CO2: 30 mmol/L (ref 22–32)
Calcium: 8.6 mg/dL — ABNORMAL LOW (ref 8.9–10.3)
Chloride: 95 mmol/L — ABNORMAL LOW (ref 98–111)
Creatinine, Ser: 1.18 mg/dL (ref 0.61–1.24)
GFR, Estimated: >60 mL/min (ref >60)
Glucose, Bld: 95 mg/dL (ref 70–99)
Potassium: 4.4 mmol/L (ref 3.5–5.1)
Sodium: 132 mmol/L — ABNORMAL LOW (ref 135–145)
Total Bilirubin: 0.6 mg/dL (ref 0.3–1.2)
Total Protein: 4.3 g/dL — ABNORMAL LOW (ref 6.5–8.1)

## 2021-08-08 LAB — CBC
HCT: 39.1 % (ref 39.0–52.0)
Hemoglobin: 12.6 g/dL — ABNORMAL LOW (ref 13.0–17.0)
MCH: 27.2 pg (ref 26.0–34.0)
MCHC: 32.2 g/dL (ref 30.0–36.0)
MCV: 84.4 fL (ref 80.0–100.0)
Platelets: 144 10*3/uL — ABNORMAL LOW (ref 150–400)
RBC: 4.63 MIL/uL (ref 4.22–5.81)
RDW: 17.8 % — ABNORMAL HIGH (ref 11.5–15.5)
WBC: 14.3 10*3/uL — ABNORMAL HIGH (ref 4.0–10.5)
nRBC: 0 % (ref 0.0–0.2)

## 2021-08-08 LAB — VALPROIC ACID LEVEL: Valproic Acid Lvl: 60 ug/mL (ref 50.0–100.0)

## 2021-08-08 LAB — MAGNESIUM: Magnesium: 1.8 mg/dL (ref 1.7–2.4)

## 2021-08-08 LAB — PHOSPHORUS: Phosphorus: 3.6 mg/dL (ref 2.5–4.6)

## 2021-08-10 LAB — CBC
HCT: 39.5 % (ref 39.0–52.0)
Hemoglobin: 13.1 g/dL (ref 13.0–17.0)
MCH: 27.9 pg (ref 26.0–34.0)
MCHC: 33.2 g/dL (ref 30.0–36.0)
MCV: 84.2 fL (ref 80.0–100.0)
Platelets: 130 10*3/uL — ABNORMAL LOW (ref 150–400)
RBC: 4.69 MIL/uL (ref 4.22–5.81)
RDW: 18.3 % — ABNORMAL HIGH (ref 11.5–15.5)
WBC: 12.6 10*3/uL — ABNORMAL HIGH (ref 4.0–10.5)
nRBC: 0 % (ref 0.0–0.2)

## 2021-08-10 LAB — MAGNESIUM: Magnesium: 2.1 mg/dL (ref 1.7–2.4)

## 2021-08-10 LAB — BASIC METABOLIC PANEL
Anion gap: 9 (ref 5–15)
BUN: 32 mg/dL — ABNORMAL HIGH (ref 8–23)
CO2: 27 mmol/L (ref 22–32)
Calcium: 8.4 mg/dL — ABNORMAL LOW (ref 8.9–10.3)
Chloride: 91 mmol/L — ABNORMAL LOW (ref 98–111)
Creatinine, Ser: 1.1 mg/dL (ref 0.61–1.24)
GFR, Estimated: >60 mL/min (ref >60)
Glucose, Bld: 90 mg/dL (ref 70–99)
Potassium: 4 mmol/L (ref 3.5–5.1)
Sodium: 127 mmol/L — ABNORMAL LOW (ref 135–145)

## 2021-08-11 DIAGNOSIS — R5383 Other fatigue: Secondary | ICD-10-CM | POA: Diagnosis not present

## 2021-08-11 DIAGNOSIS — R1310 Dysphagia, unspecified: Secondary | ICD-10-CM | POA: Diagnosis not present

## 2021-08-11 DIAGNOSIS — R2689 Other abnormalities of gait and mobility: Secondary | ICD-10-CM | POA: Diagnosis not present

## 2021-08-11 DIAGNOSIS — J440 Chronic obstructive pulmonary disease with acute lower respiratory infection: Secondary | ICD-10-CM | POA: Diagnosis not present

## 2021-08-11 DIAGNOSIS — K5900 Constipation, unspecified: Secondary | ICD-10-CM | POA: Diagnosis not present

## 2021-08-11 DIAGNOSIS — Z8701 Personal history of pneumonia (recurrent): Secondary | ICD-10-CM | POA: Diagnosis not present

## 2021-08-11 DIAGNOSIS — R059 Cough, unspecified: Secondary | ICD-10-CM | POA: Diagnosis not present

## 2021-08-11 DIAGNOSIS — R0602 Shortness of breath: Secondary | ICD-10-CM | POA: Diagnosis not present

## 2021-08-11 DIAGNOSIS — N182 Chronic kidney disease, stage 2 (mild): Secondary | ICD-10-CM | POA: Diagnosis not present

## 2021-08-11 DIAGNOSIS — I422 Other hypertrophic cardiomyopathy: Secondary | ICD-10-CM | POA: Diagnosis not present

## 2021-08-11 DIAGNOSIS — D72819 Decreased white blood cell count, unspecified: Secondary | ICD-10-CM | POA: Diagnosis not present

## 2021-08-11 DIAGNOSIS — Z7409 Other reduced mobility: Secondary | ICD-10-CM | POA: Diagnosis not present

## 2021-08-11 DIAGNOSIS — Z6821 Body mass index (BMI) 21.0-21.9, adult: Secondary | ICD-10-CM | POA: Diagnosis not present

## 2021-08-11 DIAGNOSIS — R339 Retention of urine, unspecified: Secondary | ICD-10-CM | POA: Diagnosis not present

## 2021-08-11 DIAGNOSIS — R058 Other specified cough: Secondary | ICD-10-CM | POA: Diagnosis not present

## 2021-08-11 DIAGNOSIS — I5033 Acute on chronic diastolic (congestive) heart failure: Secondary | ICD-10-CM | POA: Diagnosis not present

## 2021-08-11 DIAGNOSIS — J9601 Acute respiratory failure with hypoxia: Secondary | ICD-10-CM | POA: Diagnosis not present

## 2021-08-11 DIAGNOSIS — J449 Chronic obstructive pulmonary disease, unspecified: Secondary | ICD-10-CM | POA: Diagnosis not present

## 2021-08-11 DIAGNOSIS — I4891 Unspecified atrial fibrillation: Secondary | ICD-10-CM | POA: Diagnosis not present

## 2021-08-11 DIAGNOSIS — J9600 Acute respiratory failure, unspecified whether with hypoxia or hypercapnia: Secondary | ICD-10-CM | POA: Diagnosis not present

## 2021-08-11 DIAGNOSIS — Z87440 Personal history of urinary (tract) infections: Secondary | ICD-10-CM | POA: Diagnosis not present

## 2021-08-11 DIAGNOSIS — Z7901 Long term (current) use of anticoagulants: Secondary | ICD-10-CM | POA: Diagnosis not present

## 2021-08-11 DIAGNOSIS — E46 Unspecified protein-calorie malnutrition: Secondary | ICD-10-CM | POA: Diagnosis not present

## 2021-08-11 DIAGNOSIS — I5032 Chronic diastolic (congestive) heart failure: Secondary | ICD-10-CM | POA: Diagnosis not present

## 2021-08-11 DIAGNOSIS — F319 Bipolar disorder, unspecified: Secondary | ICD-10-CM | POA: Diagnosis not present

## 2021-08-11 DIAGNOSIS — R5381 Other malaise: Secondary | ICD-10-CM | POA: Diagnosis not present

## 2021-08-11 DIAGNOSIS — J441 Chronic obstructive pulmonary disease with (acute) exacerbation: Secondary | ICD-10-CM | POA: Diagnosis not present

## 2021-08-11 DIAGNOSIS — N179 Acute kidney failure, unspecified: Secondary | ICD-10-CM | POA: Diagnosis not present

## 2021-08-11 DIAGNOSIS — D649 Anemia, unspecified: Secondary | ICD-10-CM | POA: Diagnosis not present

## 2021-08-11 DIAGNOSIS — I10 Essential (primary) hypertension: Secondary | ICD-10-CM | POA: Diagnosis not present

## 2021-08-11 DIAGNOSIS — I130 Hypertensive heart and chronic kidney disease with heart failure and stage 1 through stage 4 chronic kidney disease, or unspecified chronic kidney disease: Secondary | ICD-10-CM | POA: Diagnosis not present

## 2021-08-11 DIAGNOSIS — N400 Enlarged prostate without lower urinary tract symptoms: Secondary | ICD-10-CM | POA: Diagnosis not present

## 2021-08-11 DIAGNOSIS — E440 Moderate protein-calorie malnutrition: Secondary | ICD-10-CM | POA: Diagnosis not present

## 2021-08-11 DIAGNOSIS — D696 Thrombocytopenia, unspecified: Secondary | ICD-10-CM | POA: Diagnosis not present

## 2021-08-11 DIAGNOSIS — Z95810 Presence of automatic (implantable) cardiac defibrillator: Secondary | ICD-10-CM | POA: Diagnosis not present

## 2021-08-11 DIAGNOSIS — E785 Hyperlipidemia, unspecified: Secondary | ICD-10-CM | POA: Diagnosis not present

## 2021-08-11 LAB — SODIUM: Sodium: 133 mmol/L — ABNORMAL LOW (ref 135–145)

## 2021-08-14 DIAGNOSIS — R2689 Other abnormalities of gait and mobility: Secondary | ICD-10-CM | POA: Diagnosis not present

## 2021-08-14 DIAGNOSIS — J440 Chronic obstructive pulmonary disease with acute lower respiratory infection: Secondary | ICD-10-CM | POA: Diagnosis not present

## 2021-08-14 DIAGNOSIS — J9600 Acute respiratory failure, unspecified whether with hypoxia or hypercapnia: Secondary | ICD-10-CM | POA: Diagnosis not present

## 2021-08-14 DIAGNOSIS — I5032 Chronic diastolic (congestive) heart failure: Secondary | ICD-10-CM | POA: Diagnosis not present

## 2021-08-16 ENCOUNTER — Inpatient Hospital Stay: Payer: Medicare Other | Admitting: Pulmonary Disease

## 2021-08-17 DIAGNOSIS — R059 Cough, unspecified: Secondary | ICD-10-CM | POA: Diagnosis not present

## 2021-08-17 DIAGNOSIS — R1310 Dysphagia, unspecified: Secondary | ICD-10-CM | POA: Diagnosis not present

## 2021-08-30 NOTE — Progress Notes (Deleted)
Electrophysiology Office Note Date: 08/30/2021  ID:  Mike Gray, DOB September 07, 1941, MRN [id number]  PCP: [name], MD Primary Cardiologist: Will [name], MD Electrophysiologist: Constance Haw, MD   CC: Routine ICD follow-up  Mike Gray is a 80 y.o. male seen today for Will [name], MD for routine electrophysiology followup.  Since last being seen in our clinic the patient reports doing ***.  he denies chest pain, palpitations, dyspnea, PND, orthopnea, nausea, vomiting, dizziness, syncope, edema, weight gain, or early satiety. He has not had ICD shocks.   Device History: Medtronic Dual Chamber ICD implanted 2015 for HCM and NSVT  Past Medical History:  Diagnosis Date   Angiodysplasia    Atrial fibrillation (HCC)    CHF (congestive heart failure) (HCC)    COPD (chronic obstructive pulmonary disease) (HCC)    GI bleed    Hypertension    Past Surgical History:  Procedure Laterality Date   COLONOSCOPY     PACEMAKER IMPLANT      Current Outpatient Medications  Medication Sig Dispense Refill   amiodarone (PACERONE) 100 MG tablet Take 1 tablet (100 mg total) by mouth daily.     apixaban (ELIQUIS) 5 MG TABS tablet Take 1 tablet (5 mg total) by mouth 2 (two) times daily. 60 tablet 5   atorvastatin (LIPITOR) 20 MG tablet Take 20 mg by mouth daily.     budesonide (PULMICORT) 0.5 MG/2ML nebulizer solution Take 2 mLs (0.5 mg total) by nebulization 2 (two) times daily. 120 mL 0   divalproex (DEPAKOTE ER) 500 MG 24 hr tablet Take 1,000 mg by mouth daily.      finasteride (PROSCAR) 5 MG tablet Take 5 mg by mouth daily.     ipratropium-albuterol (DUONEB) 0.5-2.5 (3) MG/3ML SOLN Take 3 mLs by nebulization 3 (three) times daily. 360 mL    ipratropium-albuterol (DUONEB) 0.5-2.5 (3) MG/3ML SOLN Take 3 mLs by nebulization every 2 (two) hours as needed. 360 mL    methylPREDNISolone sodium succinate (SOLU-MEDROL) 40 mg/mL injection Inject 1 mL (40 mg total) into the  vein 3 (three) times daily. 1 each 0   tamsulosin (FLOMAX) 0.4 MG CAPS capsule Take 1 capsule (0.4 mg total) by mouth daily after supper. (Patient taking differently: Take 0.8 mg by mouth daily after supper.) 30 capsule 0   No current facility-administered medications for this visit.    Allergies:   Patient has no known allergies.   Social History: Social History   Socioeconomic History   Marital status: Divorced    Spouse name: Not on file   Number of children: Not on file   Years of education: Not on file   Highest education level: Not on file  Occupational History   Not on file  Tobacco Use   Smoking status: Former    Types: Cigarettes    Quit date: 2013    Years since quitting: 9.8   Smokeless tobacco: Never  Vaping Use   Vaping Use: Never used  Substance and Sexual Activity   Alcohol use: Not Currently   Drug use: Never   Sexual activity: Not on file  Other Topics Concern   Not on file  Social History Narrative   Not on file   Social Determinants of Health   Financial Resource Strain: Not on file  Food Insecurity: Not on file  Transportation Needs: Not on file  Physical Activity: Not on file  Stress: Not on file  Social Connections: Not on file  Intimate Partner Violence:  Not on file    Family History: Family History  Problem Relation Age of Onset   Healthy Mother    Breast cancer Father 20   Cancer Other     Review of Systems: All other systems reviewed and are otherwise negative except as noted above.   Physical Exam: There were no vitals filed for this visit.   GEN- The patient is well appearing, alert and oriented x 3 today.   HEENT: normocephalic, atraumatic; sclera clear, conjunctiva pink; hearing intact; oropharynx clear; neck supple, no JVP Lymph- no cervical lymphadenopathy Lungs- Clear to ausculation bilaterally, normal work of breathing.  No wheezes, rales, rhonchi Heart- Regular rate and rhythm, no murmurs, rubs or gallops, PMI not  laterally displaced GI- soft, non-tender, non-distended, bowel sounds present, no hepatosplenomegaly Extremities- no clubbing or cyanosis. No edema; DP/PT/radial pulses 2+ bilaterally MS- no significant deformity or atrophy Skin- warm and dry, no rash or lesion; ICD pocket well healed Psych- euthymic mood, full affect Neuro- strength and sensation are intact  ICD interrogation- reviewed in detail today,  See PACEART report  EKG:  EKG {ACTION; IS/IS EYC:[id number]} ordered today. Personal review of EKG ordered {Blank single:19197::"today","***"} shows ***  Recent Labs: 07/19/2021: B Natriuretic Peptide 3,772.3 07/29/2021: TSH 0.512 08/08/2021: ALT 32 08/10/2021: BUN 32; Creatinine, Ser 1.10; Hemoglobin 13.1; Magnesium 2.1; Platelets 130; Potassium 4.0 08/11/2021: Sodium 133   Wt Readings from Last 3 Encounters:  07/27/21 171 lb 15.3 oz (78 kg)  02/28/21 159 lb (72.1 kg)  08/26/20 174 lb 12.8 oz (79.3 kg)     Other studies Reviewed: Additional studies/ records that were reviewed today include: Previous EP office notes.   Assessment and Plan:  1.  HCM s/p Medtronic dual chamber ICD  euvolemic today Stable on an appropriate medical regimen Normal ICD function See Pace Art report No changes today  2. PAF Continue eliquis for CHA2DS2-VASc of at least 3 Continue amidoarone 100 mg daily. Surveillance labs today.  3. HTN Stable on current regimen   4. NSVT Stable.  Current medicines are reviewed at length with the patient today.   =  Labs/ tests ordered today include: *** No orders of the defined types were placed in this encounter.    Disposition:   Follow up with {Blank single:19197::"Dr. Allred","Dr. Arlan Organ. Klein","Dr. Camnitz","Dr. Lambert","EP APP"} in {Blank single:19197::"2 weeks","4 weeks","3 months","6 months","12 months","as usual post gen change"}    Signed, Annamaria Helling  08/30/2021 10:41 AM  [facility] [address] [place] [address] [phone] (office) [phone] (fax)

## 2021-08-31 ENCOUNTER — Encounter: Payer: Medicare Other | Admitting: Student

## 2021-08-31 DIAGNOSIS — I422 Other hypertrophic cardiomyopathy: Secondary | ICD-10-CM

## 2021-08-31 DIAGNOSIS — I4729 Other ventricular tachycardia: Secondary | ICD-10-CM

## 2021-08-31 DIAGNOSIS — I5042 Chronic combined systolic (congestive) and diastolic (congestive) heart failure: Secondary | ICD-10-CM

## 2021-09-13 ENCOUNTER — Ambulatory Visit (INDEPENDENT_AMBULATORY_CARE_PROVIDER_SITE_OTHER): Payer: Medicare Other

## 2021-09-13 ENCOUNTER — Ambulatory Visit: Payer: Medicare Other

## 2021-09-13 ENCOUNTER — Telehealth: Payer: Self-pay

## 2021-09-13 DIAGNOSIS — J9620 Acute and chronic respiratory failure, unspecified whether with hypoxia or hypercapnia: Secondary | ICD-10-CM | POA: Diagnosis not present

## 2021-09-13 DIAGNOSIS — Z7901 Long term (current) use of anticoagulants: Secondary | ICD-10-CM | POA: Diagnosis not present

## 2021-09-13 DIAGNOSIS — I422 Other hypertrophic cardiomyopathy: Secondary | ICD-10-CM

## 2021-09-13 DIAGNOSIS — R569 Unspecified convulsions: Secondary | ICD-10-CM | POA: Diagnosis not present

## 2021-09-13 DIAGNOSIS — R4587 Impulsiveness: Secondary | ICD-10-CM | POA: Diagnosis not present

## 2021-09-13 DIAGNOSIS — Z87440 Personal history of urinary (tract) infections: Secondary | ICD-10-CM | POA: Diagnosis not present

## 2021-09-13 DIAGNOSIS — R531 Weakness: Secondary | ICD-10-CM | POA: Diagnosis not present

## 2021-09-13 DIAGNOSIS — I480 Paroxysmal atrial fibrillation: Secondary | ICD-10-CM | POA: Diagnosis not present

## 2021-09-13 DIAGNOSIS — Z9181 History of falling: Secondary | ICD-10-CM | POA: Diagnosis not present

## 2021-09-13 DIAGNOSIS — R54 Age-related physical debility: Secondary | ICD-10-CM | POA: Diagnosis not present

## 2021-09-13 DIAGNOSIS — I110 Hypertensive heart disease with heart failure: Secondary | ICD-10-CM | POA: Diagnosis not present

## 2021-09-13 DIAGNOSIS — I4891 Unspecified atrial fibrillation: Secondary | ICD-10-CM | POA: Diagnosis not present

## 2021-09-13 DIAGNOSIS — I472 Ventricular tachycardia, unspecified: Secondary | ICD-10-CM

## 2021-09-13 DIAGNOSIS — E785 Hyperlipidemia, unspecified: Secondary | ICD-10-CM | POA: Diagnosis not present

## 2021-09-13 DIAGNOSIS — E46 Unspecified protein-calorie malnutrition: Secondary | ICD-10-CM | POA: Diagnosis not present

## 2021-09-13 DIAGNOSIS — F319 Bipolar disorder, unspecified: Secondary | ICD-10-CM | POA: Diagnosis not present

## 2021-09-13 DIAGNOSIS — I5032 Chronic diastolic (congestive) heart failure: Secondary | ICD-10-CM | POA: Diagnosis not present

## 2021-09-13 DIAGNOSIS — J449 Chronic obstructive pulmonary disease, unspecified: Secondary | ICD-10-CM | POA: Diagnosis not present

## 2021-09-13 DIAGNOSIS — Z95810 Presence of automatic (implantable) cardiac defibrillator: Secondary | ICD-10-CM | POA: Diagnosis not present

## 2021-09-13 DIAGNOSIS — J181 Lobar pneumonia, unspecified organism: Secondary | ICD-10-CM | POA: Diagnosis not present

## 2021-09-13 DIAGNOSIS — J9622 Acute and chronic respiratory failure with hypercapnia: Secondary | ICD-10-CM | POA: Diagnosis not present

## 2021-09-13 DIAGNOSIS — N400 Enlarged prostate without lower urinary tract symptoms: Secondary | ICD-10-CM | POA: Diagnosis not present

## 2021-09-13 DIAGNOSIS — R7989 Other specified abnormal findings of blood chemistry: Secondary | ICD-10-CM | POA: Diagnosis not present

## 2021-09-13 DIAGNOSIS — I10 Essential (primary) hypertension: Secondary | ICD-10-CM | POA: Diagnosis not present

## 2021-09-13 DIAGNOSIS — L89151 Pressure ulcer of sacral region, stage 1: Secondary | ICD-10-CM | POA: Diagnosis not present

## 2021-09-13 DIAGNOSIS — I4729 Other ventricular tachycardia: Secondary | ICD-10-CM | POA: Diagnosis not present

## 2021-09-13 DIAGNOSIS — N182 Chronic kidney disease, stage 2 (mild): Secondary | ICD-10-CM | POA: Diagnosis not present

## 2021-09-13 LAB — CUP PACEART REMOTE DEVICE CHECK
Battery Remaining Longevity: 7 mo
Battery Remaining Longevity: 7 mo
Battery Voltage: 2.82 V
Battery Voltage: 2.82 V
Brady Statistic AP VP Percent: 58.06 %
Brady Statistic AP VP Percent: 58.06 %
Brady Statistic AP VS Percent: 0.11 %
Brady Statistic AP VS Percent: 0.11 %
Brady Statistic AS VP Percent: 40.77 %
Brady Statistic AS VP Percent: 40.77 %
Brady Statistic AS VS Percent: 1.06 %
Brady Statistic AS VS Percent: 1.06 %
Brady Statistic RA Percent Paced: 57.85 %
Brady Statistic RA Percent Paced: 57.85 %
Brady Statistic RV Percent Paced: 98.28 %
Brady Statistic RV Percent Paced: 98.28 %
Date Time Interrogation Session: 20221121212303
Date Time Interrogation Session: 20221121212303
HighPow Impedance: 72 Ohm
HighPow Impedance: 72 Ohm
Implantable Lead Implant Date: 20070328
Implantable Lead Implant Date: 20150323
Implantable Lead Implant Date: 20070328
Implantable Lead Implant Date: 20150323
Implantable Lead Location: 753859
Implantable Lead Location: 753860
Implantable Lead Location: 753859
Implantable Lead Location: 753860
Implantable Lead Model: 5076
Implantable Lead Model: 5076
Implantable Pulse Generator Implant Date: 20150323
Implantable Pulse Generator Implant Date: 20150323
Lead Channel Impedance Value: 304 Ohm
Lead Channel Impedance Value: 399 Ohm
Lead Channel Impedance Value: 475 Ohm
Lead Channel Impedance Value: 304 Ohm
Lead Channel Impedance Value: 399 Ohm
Lead Channel Impedance Value: 475 Ohm
Lead Channel Pacing Threshold Amplitude: 0.5 V
Lead Channel Pacing Threshold Amplitude: 0.75 V
Lead Channel Pacing Threshold Amplitude: 0.5 V
Lead Channel Pacing Threshold Amplitude: 0.75 V
Lead Channel Pacing Threshold Pulse Width: 0.4 ms
Lead Channel Pacing Threshold Pulse Width: 0.4 ms
Lead Channel Pacing Threshold Pulse Width: 0.4 ms
Lead Channel Pacing Threshold Pulse Width: 0.4 ms
Lead Channel Sensing Intrinsic Amplitude: 1.25 mV
Lead Channel Sensing Intrinsic Amplitude: 1.25 mV
Lead Channel Sensing Intrinsic Amplitude: 7.125 mV
Lead Channel Sensing Intrinsic Amplitude: 7.125 mV
Lead Channel Sensing Intrinsic Amplitude: 1.25 mV
Lead Channel Sensing Intrinsic Amplitude: 1.25 mV
Lead Channel Sensing Intrinsic Amplitude: 7.125 mV
Lead Channel Sensing Intrinsic Amplitude: 7.125 mV
Lead Channel Setting Pacing Amplitude: 2 V
Lead Channel Setting Pacing Amplitude: 2.5 V
Lead Channel Setting Pacing Amplitude: 2 V
Lead Channel Setting Pacing Amplitude: 2.5 V
Lead Channel Setting Pacing Pulse Width: 0.4 ms
Lead Channel Setting Pacing Pulse Width: 0.4 ms
Lead Channel Setting Sensing Sensitivity: 0.3 mV
Lead Channel Setting Sensing Sensitivity: 0.3 mV

## 2021-09-13 NOTE — Telephone Encounter (Signed)
Scheduled remote reviewed. Normal device function.  Persistent AF since 9/30, overall CVR, 1 brief RVR, Eliquis, Amiodarone prescribed. Burden 41.8%. Battery estimated 7 months.   Patient has recall in ~ message sent to scheduling to have patient f/u. Attempted to contact patient in regard to monthly remote increase/assess for AF. Had  recent hospital admission for COPD/Pneumonia and AMS back in September and October.  No answer, LMTCB.  Remotes increased to monthly. Monitor is automatic per [facility].

## 2021-09-14 DIAGNOSIS — J9622 Acute and chronic respiratory failure with hypercapnia: Secondary | ICD-10-CM | POA: Diagnosis not present

## 2021-09-14 DIAGNOSIS — R350 Frequency of micturition: Secondary | ICD-10-CM | POA: Diagnosis not present

## 2021-09-14 DIAGNOSIS — R2689 Other abnormalities of gait and mobility: Secondary | ICD-10-CM | POA: Diagnosis not present

## 2021-09-14 DIAGNOSIS — R531 Weakness: Secondary | ICD-10-CM | POA: Diagnosis not present

## 2021-09-14 DIAGNOSIS — L602 Onychogryphosis: Secondary | ICD-10-CM | POA: Diagnosis not present

## 2021-09-14 DIAGNOSIS — R54 Age-related physical debility: Secondary | ICD-10-CM | POA: Diagnosis not present

## 2021-09-14 DIAGNOSIS — I110 Hypertensive heart disease with heart failure: Secondary | ICD-10-CM | POA: Diagnosis not present

## 2021-09-14 DIAGNOSIS — R3981 Functional urinary incontinence: Secondary | ICD-10-CM | POA: Diagnosis not present

## 2021-09-14 DIAGNOSIS — I480 Paroxysmal atrial fibrillation: Secondary | ICD-10-CM | POA: Diagnosis not present

## 2021-09-16 DIAGNOSIS — J9622 Acute and chronic respiratory failure with hypercapnia: Secondary | ICD-10-CM | POA: Diagnosis not present

## 2021-09-16 DIAGNOSIS — F319 Bipolar disorder, unspecified: Secondary | ICD-10-CM | POA: Diagnosis not present

## 2021-09-16 DIAGNOSIS — R419 Unspecified symptoms and signs involving cognitive functions and awareness: Secondary | ICD-10-CM | POA: Diagnosis not present

## 2021-09-16 DIAGNOSIS — I480 Paroxysmal atrial fibrillation: Secondary | ICD-10-CM | POA: Diagnosis not present

## 2021-09-16 DIAGNOSIS — I110 Hypertensive heart disease with heart failure: Secondary | ICD-10-CM | POA: Diagnosis not present

## 2021-09-16 DIAGNOSIS — R350 Frequency of micturition: Secondary | ICD-10-CM | POA: Diagnosis not present

## 2021-09-16 DIAGNOSIS — N182 Chronic kidney disease, stage 2 (mild): Secondary | ICD-10-CM | POA: Diagnosis not present

## 2021-09-16 DIAGNOSIS — R54 Age-related physical debility: Secondary | ICD-10-CM | POA: Diagnosis not present

## 2021-09-16 DIAGNOSIS — J9620 Acute and chronic respiratory failure, unspecified whether with hypoxia or hypercapnia: Secondary | ICD-10-CM | POA: Diagnosis not present

## 2021-09-16 DIAGNOSIS — I4891 Unspecified atrial fibrillation: Secondary | ICD-10-CM | POA: Diagnosis not present

## 2021-09-16 DIAGNOSIS — L89151 Pressure ulcer of sacral region, stage 1: Secondary | ICD-10-CM | POA: Diagnosis not present

## 2021-09-16 DIAGNOSIS — E46 Unspecified protein-calorie malnutrition: Secondary | ICD-10-CM | POA: Diagnosis not present

## 2021-09-16 DIAGNOSIS — I5032 Chronic diastolic (congestive) heart failure: Secondary | ICD-10-CM | POA: Diagnosis not present

## 2021-09-16 DIAGNOSIS — R531 Weakness: Secondary | ICD-10-CM | POA: Diagnosis not present

## 2021-09-16 DIAGNOSIS — R4587 Impulsiveness: Secondary | ICD-10-CM | POA: Diagnosis not present

## 2021-09-16 DIAGNOSIS — R2689 Other abnormalities of gait and mobility: Secondary | ICD-10-CM | POA: Diagnosis not present

## 2021-09-16 DIAGNOSIS — L602 Onychogryphosis: Secondary | ICD-10-CM | POA: Diagnosis not present

## 2021-09-20 DIAGNOSIS — L89151 Pressure ulcer of sacral region, stage 1: Secondary | ICD-10-CM | POA: Diagnosis not present

## 2021-09-20 DIAGNOSIS — I4891 Unspecified atrial fibrillation: Secondary | ICD-10-CM | POA: Diagnosis not present

## 2021-09-20 DIAGNOSIS — E46 Unspecified protein-calorie malnutrition: Secondary | ICD-10-CM | POA: Diagnosis not present

## 2021-09-20 DIAGNOSIS — L602 Onychogryphosis: Secondary | ICD-10-CM | POA: Diagnosis not present

## 2021-09-20 DIAGNOSIS — R2689 Other abnormalities of gait and mobility: Secondary | ICD-10-CM | POA: Diagnosis not present

## 2021-09-20 DIAGNOSIS — J9620 Acute and chronic respiratory failure, unspecified whether with hypoxia or hypercapnia: Secondary | ICD-10-CM | POA: Diagnosis not present

## 2021-09-20 DIAGNOSIS — I110 Hypertensive heart disease with heart failure: Secondary | ICD-10-CM | POA: Diagnosis not present

## 2021-09-20 DIAGNOSIS — I509 Heart failure, unspecified: Secondary | ICD-10-CM | POA: Diagnosis not present

## 2021-09-20 DIAGNOSIS — R54 Age-related physical debility: Secondary | ICD-10-CM | POA: Diagnosis not present

## 2021-09-20 DIAGNOSIS — R350 Frequency of micturition: Secondary | ICD-10-CM | POA: Diagnosis not present

## 2021-09-20 DIAGNOSIS — N182 Chronic kidney disease, stage 2 (mild): Secondary | ICD-10-CM | POA: Diagnosis not present

## 2021-09-20 DIAGNOSIS — I5032 Chronic diastolic (congestive) heart failure: Secondary | ICD-10-CM | POA: Diagnosis not present

## 2021-09-21 DIAGNOSIS — S80211D Abrasion, right knee, subsequent encounter: Secondary | ICD-10-CM | POA: Diagnosis not present

## 2021-09-21 DIAGNOSIS — J9620 Acute and chronic respiratory failure, unspecified whether with hypoxia or hypercapnia: Secondary | ICD-10-CM | POA: Diagnosis not present

## 2021-09-21 DIAGNOSIS — I4891 Unspecified atrial fibrillation: Secondary | ICD-10-CM | POA: Diagnosis not present

## 2021-09-21 DIAGNOSIS — I110 Hypertensive heart disease with heart failure: Secondary | ICD-10-CM | POA: Diagnosis not present

## 2021-09-21 DIAGNOSIS — L89151 Pressure ulcer of sacral region, stage 1: Secondary | ICD-10-CM | POA: Diagnosis not present

## 2021-09-21 DIAGNOSIS — E46 Unspecified protein-calorie malnutrition: Secondary | ICD-10-CM | POA: Diagnosis not present

## 2021-09-21 DIAGNOSIS — I5032 Chronic diastolic (congestive) heart failure: Secondary | ICD-10-CM | POA: Diagnosis not present

## 2021-09-21 DIAGNOSIS — I509 Heart failure, unspecified: Secondary | ICD-10-CM | POA: Diagnosis not present

## 2021-09-21 DIAGNOSIS — R350 Frequency of micturition: Secondary | ICD-10-CM | POA: Diagnosis not present

## 2021-09-21 DIAGNOSIS — J449 Chronic obstructive pulmonary disease, unspecified: Secondary | ICD-10-CM | POA: Diagnosis not present

## 2021-09-21 DIAGNOSIS — I480 Paroxysmal atrial fibrillation: Secondary | ICD-10-CM | POA: Diagnosis not present

## 2021-09-21 DIAGNOSIS — J9622 Acute and chronic respiratory failure with hypercapnia: Secondary | ICD-10-CM | POA: Diagnosis not present

## 2021-09-21 DIAGNOSIS — N182 Chronic kidney disease, stage 2 (mild): Secondary | ICD-10-CM | POA: Diagnosis not present

## 2021-09-21 DIAGNOSIS — S80212D Abrasion, left knee, subsequent encounter: Secondary | ICD-10-CM | POA: Diagnosis not present

## 2021-09-21 DIAGNOSIS — J440 Chronic obstructive pulmonary disease with acute lower respiratory infection: Secondary | ICD-10-CM | POA: Diagnosis not present

## 2021-09-21 DIAGNOSIS — R54 Age-related physical debility: Secondary | ICD-10-CM | POA: Diagnosis not present

## 2021-09-22 DIAGNOSIS — I509 Heart failure, unspecified: Secondary | ICD-10-CM | POA: Diagnosis not present

## 2021-09-22 DIAGNOSIS — R059 Cough, unspecified: Secondary | ICD-10-CM | POA: Diagnosis not present

## 2021-09-22 DIAGNOSIS — R351 Nocturia: Secondary | ICD-10-CM | POA: Diagnosis not present

## 2021-09-22 DIAGNOSIS — N401 Enlarged prostate with lower urinary tract symptoms: Secondary | ICD-10-CM | POA: Diagnosis not present

## 2021-09-22 NOTE — Telephone Encounter (Signed)
Second attempt to contact patient. No answer. Brothers name who is on DPR [name]) has the same number patient does. LMTCB.    [name] would like patient to follow up in AF clinic to discuss cardioversion.

## 2021-09-22 NOTE — Progress Notes (Signed)
Remote ICD transmission.   

## 2021-09-23 DIAGNOSIS — J449 Chronic obstructive pulmonary disease, unspecified: Secondary | ICD-10-CM | POA: Diagnosis not present

## 2021-09-23 DIAGNOSIS — Z9981 Dependence on supplemental oxygen: Secondary | ICD-10-CM | POA: Diagnosis not present

## 2021-09-23 DIAGNOSIS — I110 Hypertensive heart disease with heart failure: Secondary | ICD-10-CM | POA: Diagnosis not present

## 2021-09-23 DIAGNOSIS — I480 Paroxysmal atrial fibrillation: Secondary | ICD-10-CM | POA: Diagnosis not present

## 2021-09-23 DIAGNOSIS — R54 Age-related physical debility: Secondary | ICD-10-CM | POA: Diagnosis not present

## 2021-09-23 DIAGNOSIS — R058 Other specified cough: Secondary | ICD-10-CM | POA: Diagnosis not present

## 2021-09-23 DIAGNOSIS — R3981 Functional urinary incontinence: Secondary | ICD-10-CM | POA: Diagnosis not present

## 2021-09-23 DIAGNOSIS — E785 Hyperlipidemia, unspecified: Secondary | ICD-10-CM | POA: Diagnosis not present

## 2021-09-23 DIAGNOSIS — N401 Enlarged prostate with lower urinary tract symptoms: Secondary | ICD-10-CM | POA: Diagnosis not present

## 2021-09-23 DIAGNOSIS — I509 Heart failure, unspecified: Secondary | ICD-10-CM | POA: Diagnosis not present

## 2021-09-23 DIAGNOSIS — F319 Bipolar disorder, unspecified: Secondary | ICD-10-CM | POA: Diagnosis not present

## 2021-09-26 DIAGNOSIS — Z87440 Personal history of urinary (tract) infections: Secondary | ICD-10-CM | POA: Diagnosis not present

## 2021-09-26 DIAGNOSIS — J9621 Acute and chronic respiratory failure with hypoxia: Secondary | ICD-10-CM | POA: Diagnosis not present

## 2021-09-26 DIAGNOSIS — R0989 Other specified symptoms and signs involving the circulatory and respiratory systems: Secondary | ICD-10-CM | POA: Diagnosis not present

## 2021-09-26 DIAGNOSIS — R2689 Other abnormalities of gait and mobility: Secondary | ICD-10-CM | POA: Diagnosis not present

## 2021-09-26 DIAGNOSIS — M6281 Muscle weakness (generalized): Secondary | ICD-10-CM | POA: Diagnosis not present

## 2021-09-26 DIAGNOSIS — R350 Frequency of micturition: Secondary | ICD-10-CM | POA: Diagnosis not present

## 2021-09-26 DIAGNOSIS — Z7901 Long term (current) use of anticoagulants: Secondary | ICD-10-CM | POA: Diagnosis not present

## 2021-09-26 DIAGNOSIS — F0280 Dementia in other diseases classified elsewhere without behavioral disturbance: Secondary | ICD-10-CM | POA: Diagnosis not present

## 2021-09-26 DIAGNOSIS — J440 Chronic obstructive pulmonary disease with acute lower respiratory infection: Secondary | ICD-10-CM | POA: Diagnosis not present

## 2021-09-26 DIAGNOSIS — I499 Cardiac arrhythmia, unspecified: Secondary | ICD-10-CM | POA: Diagnosis not present

## 2021-09-26 DIAGNOSIS — E46 Unspecified protein-calorie malnutrition: Secondary | ICD-10-CM | POA: Diagnosis not present

## 2021-09-26 DIAGNOSIS — I480 Paroxysmal atrial fibrillation: Secondary | ICD-10-CM | POA: Diagnosis not present

## 2021-09-26 DIAGNOSIS — E785 Hyperlipidemia, unspecified: Secondary | ICD-10-CM | POA: Diagnosis not present

## 2021-09-26 DIAGNOSIS — J449 Chronic obstructive pulmonary disease, unspecified: Secondary | ICD-10-CM | POA: Diagnosis not present

## 2021-09-26 DIAGNOSIS — D696 Thrombocytopenia, unspecified: Secondary | ICD-10-CM | POA: Diagnosis not present

## 2021-09-26 DIAGNOSIS — D6869 Other thrombophilia: Secondary | ICD-10-CM | POA: Diagnosis not present

## 2021-09-26 DIAGNOSIS — I10 Essential (primary) hypertension: Secondary | ICD-10-CM | POA: Diagnosis not present

## 2021-09-26 DIAGNOSIS — I4891 Unspecified atrial fibrillation: Secondary | ICD-10-CM | POA: Diagnosis not present

## 2021-09-26 DIAGNOSIS — J181 Lobar pneumonia, unspecified organism: Secondary | ICD-10-CM | POA: Diagnosis not present

## 2021-09-26 DIAGNOSIS — R54 Age-related physical debility: Secondary | ICD-10-CM | POA: Diagnosis not present

## 2021-09-26 DIAGNOSIS — I509 Heart failure, unspecified: Secondary | ICD-10-CM | POA: Diagnosis not present

## 2021-09-26 DIAGNOSIS — D72819 Decreased white blood cell count, unspecified: Secondary | ICD-10-CM | POA: Diagnosis not present

## 2021-09-26 DIAGNOSIS — I5030 Unspecified diastolic (congestive) heart failure: Secondary | ICD-10-CM | POA: Diagnosis not present

## 2021-09-26 DIAGNOSIS — R1312 Dysphagia, oropharyngeal phase: Secondary | ICD-10-CM | POA: Diagnosis not present

## 2021-09-26 DIAGNOSIS — R3981 Functional urinary incontinence: Secondary | ICD-10-CM | POA: Diagnosis not present

## 2021-09-26 DIAGNOSIS — N400 Enlarged prostate without lower urinary tract symptoms: Secondary | ICD-10-CM | POA: Diagnosis not present

## 2021-09-26 DIAGNOSIS — R0602 Shortness of breath: Secondary | ICD-10-CM | POA: Diagnosis not present

## 2021-09-26 DIAGNOSIS — R1310 Dysphagia, unspecified: Secondary | ICD-10-CM | POA: Diagnosis not present

## 2021-09-26 DIAGNOSIS — R279 Unspecified lack of coordination: Secondary | ICD-10-CM | POA: Diagnosis not present

## 2021-09-26 DIAGNOSIS — I110 Hypertensive heart disease with heart failure: Secondary | ICD-10-CM | POA: Diagnosis not present

## 2021-09-26 DIAGNOSIS — R059 Cough, unspecified: Secondary | ICD-10-CM | POA: Diagnosis not present

## 2021-09-26 DIAGNOSIS — R419 Unspecified symptoms and signs involving cognitive functions and awareness: Secondary | ICD-10-CM | POA: Diagnosis not present

## 2021-09-26 DIAGNOSIS — N401 Enlarged prostate with lower urinary tract symptoms: Secondary | ICD-10-CM | POA: Diagnosis not present

## 2021-09-26 DIAGNOSIS — R531 Weakness: Secondary | ICD-10-CM | POA: Diagnosis not present

## 2021-09-26 DIAGNOSIS — I422 Other hypertrophic cardiomyopathy: Secondary | ICD-10-CM | POA: Diagnosis not present

## 2021-09-26 DIAGNOSIS — F319 Bipolar disorder, unspecified: Secondary | ICD-10-CM | POA: Diagnosis not present

## 2021-09-26 DIAGNOSIS — J9622 Acute and chronic respiratory failure with hypercapnia: Secondary | ICD-10-CM | POA: Diagnosis not present

## 2021-09-26 NOTE — Telephone Encounter (Signed)
Appointment scheduled with Any Tillery on 10/13/21

## 2021-09-27 ENCOUNTER — Inpatient Hospital Stay: Payer: Medicare Other | Admitting: Pulmonary Disease

## 2021-09-27 DIAGNOSIS — J449 Chronic obstructive pulmonary disease, unspecified: Secondary | ICD-10-CM | POA: Diagnosis not present

## 2021-09-27 DIAGNOSIS — I110 Hypertensive heart disease with heart failure: Secondary | ICD-10-CM | POA: Diagnosis not present

## 2021-09-27 DIAGNOSIS — E785 Hyperlipidemia, unspecified: Secondary | ICD-10-CM | POA: Diagnosis not present

## 2021-09-27 DIAGNOSIS — I480 Paroxysmal atrial fibrillation: Secondary | ICD-10-CM | POA: Diagnosis not present

## 2021-09-27 DIAGNOSIS — F319 Bipolar disorder, unspecified: Secondary | ICD-10-CM | POA: Diagnosis not present

## 2021-09-27 DIAGNOSIS — I509 Heart failure, unspecified: Secondary | ICD-10-CM | POA: Diagnosis not present

## 2021-09-27 DIAGNOSIS — R3981 Functional urinary incontinence: Secondary | ICD-10-CM | POA: Diagnosis not present

## 2021-09-27 DIAGNOSIS — R54 Age-related physical debility: Secondary | ICD-10-CM | POA: Diagnosis not present

## 2021-09-27 DIAGNOSIS — R2689 Other abnormalities of gait and mobility: Secondary | ICD-10-CM | POA: Diagnosis not present

## 2021-09-27 DIAGNOSIS — N401 Enlarged prostate with lower urinary tract symptoms: Secondary | ICD-10-CM | POA: Diagnosis not present

## 2021-09-28 DIAGNOSIS — I4891 Unspecified atrial fibrillation: Secondary | ICD-10-CM | POA: Diagnosis not present

## 2021-09-28 DIAGNOSIS — F319 Bipolar disorder, unspecified: Secondary | ICD-10-CM | POA: Diagnosis not present

## 2021-09-28 DIAGNOSIS — J9621 Acute and chronic respiratory failure with hypoxia: Secondary | ICD-10-CM | POA: Diagnosis not present

## 2021-09-28 DIAGNOSIS — D6869 Other thrombophilia: Secondary | ICD-10-CM | POA: Diagnosis not present

## 2021-09-29 DIAGNOSIS — F319 Bipolar disorder, unspecified: Secondary | ICD-10-CM | POA: Diagnosis not present

## 2021-09-29 DIAGNOSIS — E785 Hyperlipidemia, unspecified: Secondary | ICD-10-CM | POA: Diagnosis not present

## 2021-09-29 DIAGNOSIS — I509 Heart failure, unspecified: Secondary | ICD-10-CM | POA: Diagnosis not present

## 2021-09-29 DIAGNOSIS — J449 Chronic obstructive pulmonary disease, unspecified: Secondary | ICD-10-CM | POA: Diagnosis not present

## 2021-09-29 DIAGNOSIS — R54 Age-related physical debility: Secondary | ICD-10-CM | POA: Diagnosis not present

## 2021-09-29 DIAGNOSIS — R3981 Functional urinary incontinence: Secondary | ICD-10-CM | POA: Diagnosis not present

## 2021-09-29 DIAGNOSIS — I480 Paroxysmal atrial fibrillation: Secondary | ICD-10-CM | POA: Diagnosis not present

## 2021-09-29 DIAGNOSIS — R2689 Other abnormalities of gait and mobility: Secondary | ICD-10-CM | POA: Diagnosis not present

## 2021-09-29 DIAGNOSIS — N401 Enlarged prostate with lower urinary tract symptoms: Secondary | ICD-10-CM | POA: Diagnosis not present

## 2021-09-29 DIAGNOSIS — I110 Hypertensive heart disease with heart failure: Secondary | ICD-10-CM | POA: Diagnosis not present

## 2021-09-30 ENCOUNTER — Other Ambulatory Visit (HOSPITAL_COMMUNITY): Payer: Self-pay

## 2021-09-30 DIAGNOSIS — I480 Paroxysmal atrial fibrillation: Secondary | ICD-10-CM | POA: Diagnosis not present

## 2021-09-30 DIAGNOSIS — R059 Cough, unspecified: Secondary | ICD-10-CM

## 2021-09-30 DIAGNOSIS — I110 Hypertensive heart disease with heart failure: Secondary | ICD-10-CM | POA: Diagnosis not present

## 2021-09-30 DIAGNOSIS — I509 Heart failure, unspecified: Secondary | ICD-10-CM | POA: Diagnosis not present

## 2021-09-30 DIAGNOSIS — J449 Chronic obstructive pulmonary disease, unspecified: Secondary | ICD-10-CM | POA: Diagnosis not present

## 2021-09-30 DIAGNOSIS — N401 Enlarged prostate with lower urinary tract symptoms: Secondary | ICD-10-CM | POA: Diagnosis not present

## 2021-09-30 DIAGNOSIS — J440 Chronic obstructive pulmonary disease with acute lower respiratory infection: Secondary | ICD-10-CM | POA: Diagnosis not present

## 2021-09-30 DIAGNOSIS — R54 Age-related physical debility: Secondary | ICD-10-CM | POA: Diagnosis not present

## 2021-09-30 DIAGNOSIS — E785 Hyperlipidemia, unspecified: Secondary | ICD-10-CM | POA: Diagnosis not present

## 2021-09-30 DIAGNOSIS — R1310 Dysphagia, unspecified: Secondary | ICD-10-CM

## 2021-10-04 DIAGNOSIS — R54 Age-related physical debility: Secondary | ICD-10-CM | POA: Diagnosis not present

## 2021-10-04 DIAGNOSIS — R2689 Other abnormalities of gait and mobility: Secondary | ICD-10-CM | POA: Diagnosis not present

## 2021-10-04 DIAGNOSIS — I110 Hypertensive heart disease with heart failure: Secondary | ICD-10-CM | POA: Diagnosis not present

## 2021-10-04 DIAGNOSIS — I480 Paroxysmal atrial fibrillation: Secondary | ICD-10-CM | POA: Diagnosis not present

## 2021-10-04 DIAGNOSIS — I509 Heart failure, unspecified: Secondary | ICD-10-CM | POA: Diagnosis not present

## 2021-10-04 DIAGNOSIS — R3981 Functional urinary incontinence: Secondary | ICD-10-CM | POA: Diagnosis not present

## 2021-10-04 DIAGNOSIS — N401 Enlarged prostate with lower urinary tract symptoms: Secondary | ICD-10-CM | POA: Diagnosis not present

## 2021-10-04 DIAGNOSIS — F319 Bipolar disorder, unspecified: Secondary | ICD-10-CM | POA: Diagnosis not present

## 2021-10-04 DIAGNOSIS — J449 Chronic obstructive pulmonary disease, unspecified: Secondary | ICD-10-CM | POA: Diagnosis not present

## 2021-10-04 DIAGNOSIS — E785 Hyperlipidemia, unspecified: Secondary | ICD-10-CM | POA: Diagnosis not present

## 2021-10-05 ENCOUNTER — Ambulatory Visit (INDEPENDENT_AMBULATORY_CARE_PROVIDER_SITE_OTHER): Payer: Self-pay | Admitting: Podiatry

## 2021-10-05 DIAGNOSIS — Z91199 Patient's noncompliance with other medical treatment and regimen due to unspecified reason: Secondary | ICD-10-CM

## 2021-10-05 IMAGING — DX DG CHEST 2V
2 series · 2 of 2 positions shown · non-contrast
Comparison: 07/04/2020.

CLINICAL DATA: Chronic obstructive pulmonary disease, unspecified
COPD type (HCC)

EXAM:
CHEST - 2 VIEW

[dg chest 2 view (1 of 2)]
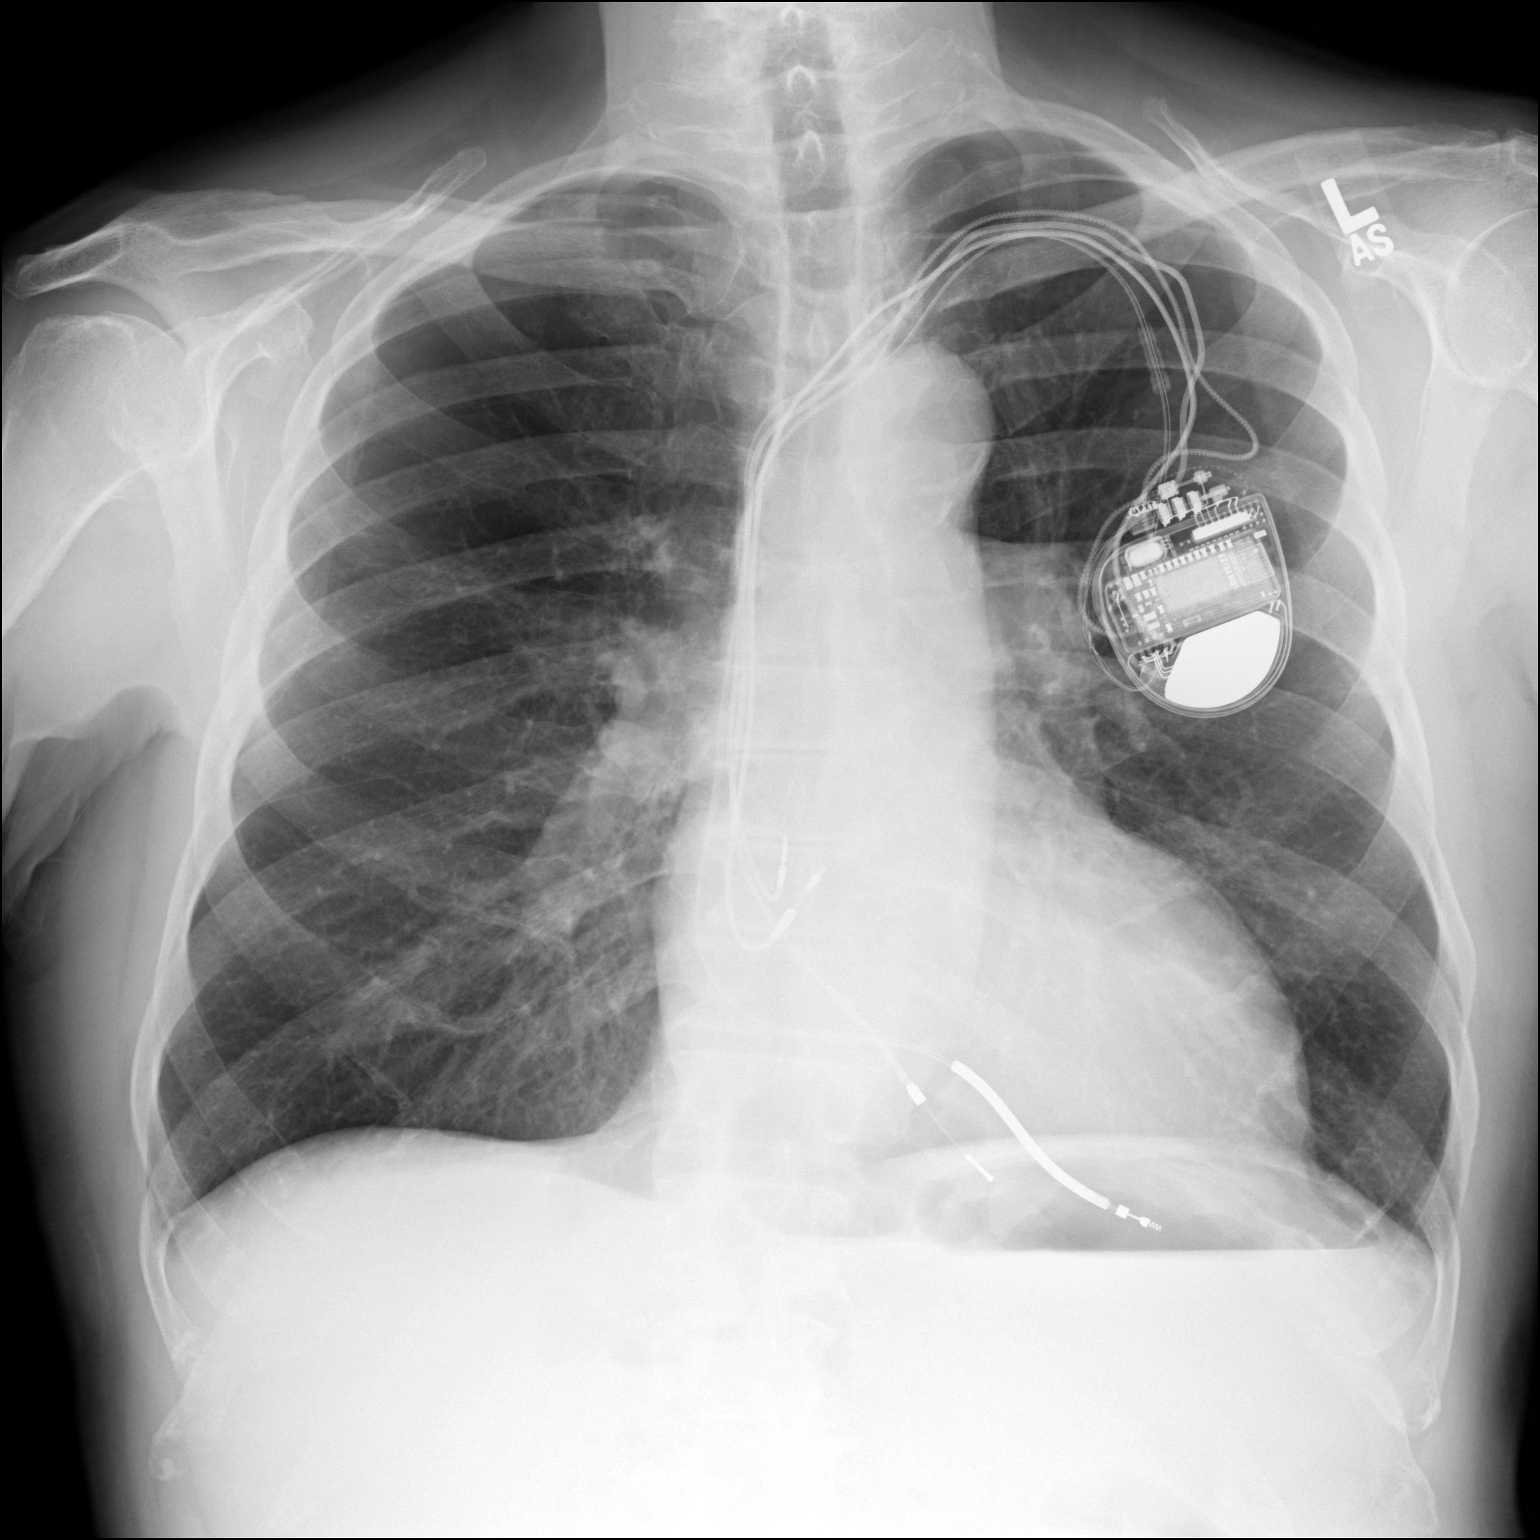

[dg chest 2 view (2 of 2)]
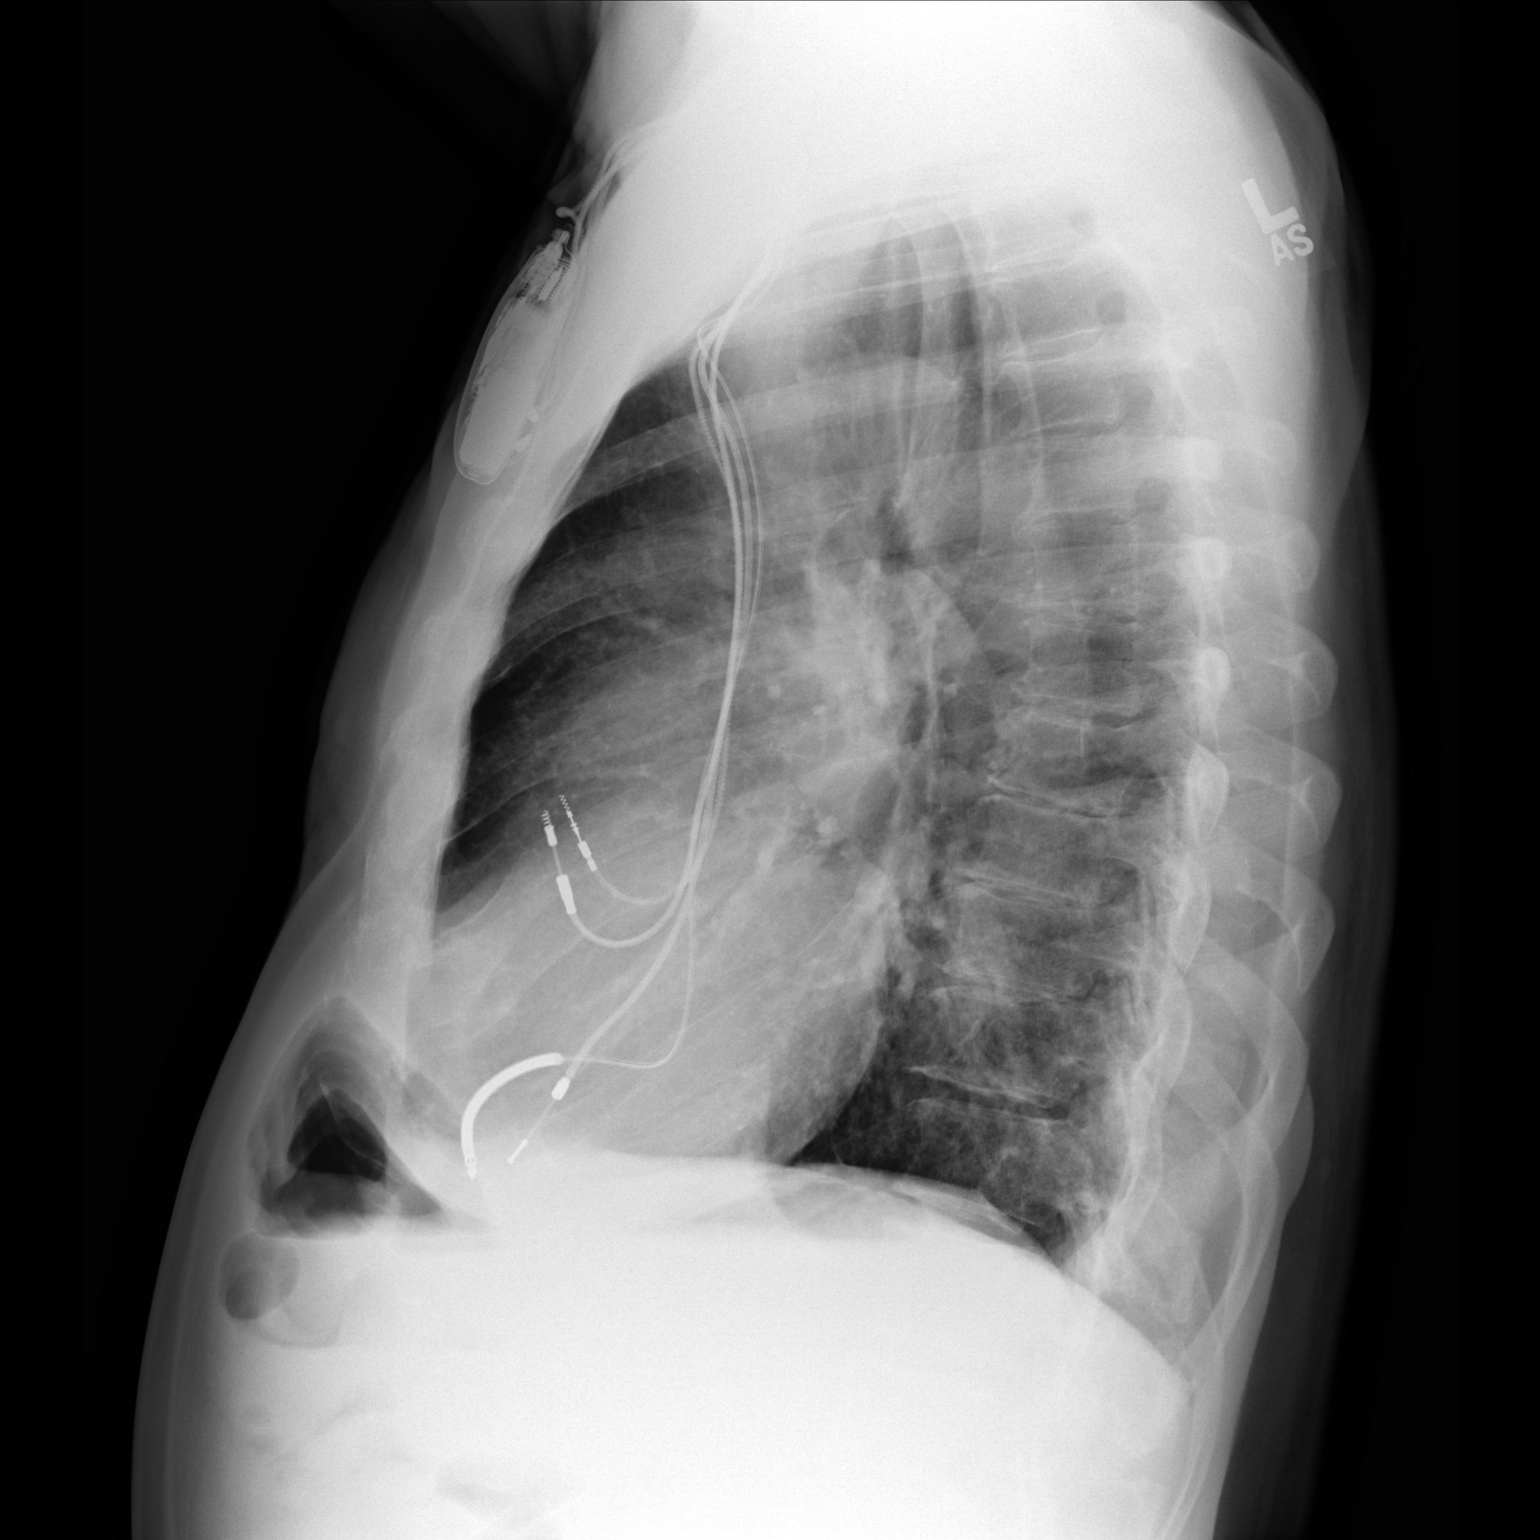

[2 of 2 positions shown; findings below may reference images not displayed]

FINDINGS: Borderline enlarged cardiac silhouette. Left subclavian approach for
lead cardiac rhythm maintenance device. Calcific atherosclerosis of
the aorta. No consolidation. No visible pleural effusions or
pneumothorax. Chronic hyperinflation.
IMPRESSION: 1. No evidence of acute cardiopulmonary disease.
2. Chronic hyperinflation, compatible with emphysema.
3. Borderline cardiomegaly.

## 2021-10-09 NOTE — Progress Notes (Signed)
Patient with hospitalization and discharge to rehab facility. No show. No charge.

## 2021-10-11 DIAGNOSIS — R2689 Other abnormalities of gait and mobility: Secondary | ICD-10-CM | POA: Diagnosis not present

## 2021-10-11 DIAGNOSIS — J449 Chronic obstructive pulmonary disease, unspecified: Secondary | ICD-10-CM | POA: Diagnosis not present

## 2021-10-11 DIAGNOSIS — E785 Hyperlipidemia, unspecified: Secondary | ICD-10-CM | POA: Diagnosis not present

## 2021-10-11 DIAGNOSIS — I509 Heart failure, unspecified: Secondary | ICD-10-CM | POA: Diagnosis not present

## 2021-10-11 DIAGNOSIS — R54 Age-related physical debility: Secondary | ICD-10-CM | POA: Diagnosis not present

## 2021-10-11 DIAGNOSIS — F319 Bipolar disorder, unspecified: Secondary | ICD-10-CM | POA: Diagnosis not present

## 2021-10-11 DIAGNOSIS — R3981 Functional urinary incontinence: Secondary | ICD-10-CM | POA: Diagnosis not present

## 2021-10-11 DIAGNOSIS — I480 Paroxysmal atrial fibrillation: Secondary | ICD-10-CM | POA: Diagnosis not present

## 2021-10-11 DIAGNOSIS — I110 Hypertensive heart disease with heart failure: Secondary | ICD-10-CM | POA: Diagnosis not present

## 2021-10-11 DIAGNOSIS — N401 Enlarged prostate with lower urinary tract symptoms: Secondary | ICD-10-CM | POA: Diagnosis not present

## 2021-10-12 ENCOUNTER — Ambulatory Visit (HOSPITAL_COMMUNITY)
Admission: RE | Admit: 2021-10-12 | Discharge: 2021-10-12 | Disposition: A | Discharge status: Home / Self Care | Payer: No Typology Code available for payment source | Source: Ambulatory Visit | Attending: Internal Medicine | Admitting: Internal Medicine

## 2021-10-12 ENCOUNTER — Other Ambulatory Visit: Payer: Self-pay

## 2021-10-12 DIAGNOSIS — R059 Cough, unspecified: Secondary | ICD-10-CM | POA: Insufficient documentation

## 2021-10-12 DIAGNOSIS — R1310 Dysphagia, unspecified: Secondary | ICD-10-CM | POA: Insufficient documentation

## 2021-10-12 NOTE — Progress Notes (Addendum)
Modified Barium Swallow Progress Note  Patient Details  Name: Mike Gray MRN: [id number] Date of Birth: 29-Nov-1940  Today's Date: 10/12/2021  Modified Barium Swallow completed.  Full report located under Chart Review in the Imaging Section.  Brief recommendations include the following:  Clinical Impression  Pt presents with oropharyngeal dysphagia characterized by impaired bolus cohesion, a pharyngeal delay, reduced pharyngeal constriction, and reduced PES distention. He demonstrated premature spillage to the pyriform sinuses with thin liquids, and vallecular residue. A single instance of aspiration (PAS 7) was noted with the initial bolus of thin liquids via cup and subsequence instances of penetration (PAS 3,5) were noted with thin liquids via straw. Coughing was noted following aspiration, but this was ineffective in fully expelling the aspirate. No instances of laryngeal invasion were noted with nectar thick liquids and laryngeal invasion was eliminated with thin liquids when a chin tuck was used. Amount of vallecular residue was increased with large bolus sizes and residue was reduced with a liquid wash. Transport of the 80mm barium tablet was briefly halted in the upper thoracic esophagus, but then facilitated with an additional bolus of thin liquids. Esophageal assessment (e.g., esophagram) may be conducted to determine the etiology of this if referring MD deems it necessary. A regular texture diet with thin liquids is recommended at this time with observance of swallowing precuations listed below. Pt and his family were educated regarding results and recommendations; understanding was verbalized and demonstrated using teach back. Skilled SLP services are recommended for dysphagia.    Swallow Evaluation Recommendations       SLP Diet Recommendations: Regular solids;Thin liquid   Liquid Administration via: Cup;No straw   Medication Administration: Whole meds with liquid    Supervision: Full supervision/cueing for compensatory strategies;Patient able to self feed   Compensations: Slow rate;Small sips/bites;Minimize environmental distractions;Chin tuck   Postural Changes: Seated upright at 90 degrees   Oral Care Recommendations: Oral care BID      Mike Gray. Mike Gray, [place], [place] Office number [phone] Pager [place] 10/12/2021,2:19 PM

## 2021-10-12 NOTE — Progress Notes (Deleted)
Electrophysiology Office Note Date: 10/12/2021  ID:  Mike Gray, DOB January 13, 1941, MRN [id number]  PCP: [name], MD Primary Cardiologist: Will [name], MD Electrophysiologist: Constance Haw, MD   CC: Routine ICD follow-up  Mike Gray is a 80 y.o. male seen today for Will [name], MD for routine electrophysiology followup.  Since last being seen in our clinic the patient reports doing ***.  he denies chest pain, palpitations, dyspnea, PND, orthopnea, nausea, vomiting, dizziness, syncope, edema, weight gain, or early satiety. He has not had ICD shocks.   Device History: MDT dual chamber ICD, RA lead is from 2007, RV lead is from 2015 Current device implanted 01/12/2014 He has abandoned RA and RV pacing leads and can not have MRIs  Past Medical History:  Diagnosis Date   Angiodysplasia    Atrial fibrillation (HCC)    CHF (congestive heart failure) (HCC)    COPD (chronic obstructive pulmonary disease) ([place])    GI bleed    Hypertension    Past Surgical History:  Procedure Laterality Date   COLONOSCOPY     PACEMAKER IMPLANT      Current Outpatient Medications  Medication Sig Dispense Refill   amiodarone (PACERONE) 100 MG tablet Take 1 tablet (100 mg total) by mouth daily.     apixaban (ELIQUIS) 5 MG TABS tablet Take 1 tablet (5 mg total) by mouth 2 (two) times daily. 60 tablet 5   atorvastatin (LIPITOR) 20 MG tablet Take 20 mg by mouth daily.     budesonide (PULMICORT) 0.5 MG/2ML nebulizer solution Take 2 mLs (0.5 mg total) by nebulization 2 (two) times daily. 120 mL 0   divalproex (DEPAKOTE ER) 500 MG 24 hr tablet Take 1,000 mg by mouth daily.      finasteride (PROSCAR) 5 MG tablet Take 5 mg by mouth daily.     ipratropium-albuterol (DUONEB) 0.5-2.5 (3) MG/3ML SOLN Take 3 mLs by nebulization 3 (three) times daily. 360 mL    ipratropium-albuterol (DUONEB) 0.5-2.5 (3) MG/3ML SOLN Take 3 mLs by nebulization every 2 (two) hours as needed. 360 mL     methylPREDNISolone sodium succinate (SOLU-MEDROL) 40 mg/mL injection Inject 1 mL (40 mg total) into the vein 3 (three) times daily. 1 each 0   tamsulosin (FLOMAX) 0.4 MG CAPS capsule Take 1 capsule (0.4 mg total) by mouth daily after supper. (Patient taking differently: Take 0.8 mg by mouth daily after supper.) 30 capsule 0   No current facility-administered medications for this visit.    Allergies:   Patient has no known allergies.   Social History: Social History   Socioeconomic History   Marital status: Divorced    Spouse name: Not on file   Number of children: Not on file   Years of education: Not on file   Highest education level: Not on file  Occupational History   Not on file  Tobacco Use   Smoking status: Former    Types: Cigarettes    Quit date: 2013    Years since quitting: 9.9   Smokeless tobacco: Never  Vaping Use   Vaping Use: Never used  Substance and Sexual Activity   Alcohol use: Not Currently   Drug use: Never   Sexual activity: Not on file  Other Topics Concern   Not on file  Social History Narrative   Not on file   Social Determinants of Health   Financial Resource Strain: Not on file  Food Insecurity: Not on file  Transportation Needs: Not on file  Physical Activity: Not on file  Stress: Not on file  Social Connections: Not on file  Intimate Partner Violence: Not on file    Family History: Family History  Problem Relation Age of Onset   Healthy Mother    Breast cancer Father 76   Cancer Other     Review of Systems: All other systems reviewed and are otherwise negative except as noted above.   Physical Exam: There were no vitals filed for this visit.   GEN- The patient is well appearing, alert and oriented x 3 today.   HEENT: normocephalic, atraumatic; sclera clear, conjunctiva pink; hearing intact; oropharynx clear; neck supple, no JVP Lymph- no cervical lymphadenopathy Lungs- Clear to ausculation bilaterally, normal work of  breathing.  No wheezes, rales, rhonchi Heart- Regular rate and rhythm, no murmurs, rubs or gallops, PMI not laterally displaced GI- soft, non-tender, non-distended, bowel sounds present, no hepatosplenomegaly Extremities- no clubbing or cyanosis. No edema; DP/PT/radial pulses 2+ bilaterally MS- no significant deformity or atrophy Skin- warm and dry, no rash or lesion; ICD pocket well healed Psych- euthymic mood, full affect Neuro- strength and sensation are intact  ICD interrogation- reviewed in detail today,  See PACEART report  EKG:  EKG {ACTION; IS/IS HER:[id number]} ordered today. Personal review of EKG ordered {Blank single:19197::"today","***"} shows ***  Recent Labs: 07/19/2021: B Natriuretic Peptide 3,772.3 07/29/2021: TSH 0.512 08/08/2021: ALT 32 08/10/2021: BUN 32; Creatinine, Ser 1.10; Hemoglobin 13.1; Magnesium 2.1; Platelets 130; Potassium 4.0 08/11/2021: Sodium 133   Wt Readings from Last 3 Encounters:  07/27/21 171 lb 15.3 oz (78 kg)  02/28/21 159 lb (72.1 kg)  08/26/20 174 lb 12.8 oz (79.3 kg)     Other studies Reviewed: Additional studies/ records that were reviewed today include: Previous EP office notes.   Assessment and Plan:  1.  Hypertrophic cardiomyopathy s/p Medtronic {Blank single:19197::"single chamber ICD","dual chamber ICD","CRT-D","S-ICD"}  euvolemic today Stable on an appropriate medical regimen Normal ICD function See Pace Art report No changes today  2.  Paroxysmal atrial fibrillation:   Continue Eliquis.  CHA2DS2-VASc of 3.  Has been loaded on amiodarone.  High risk medication monitoring.     3. HTN Stable on current regimen    4.  Nonsustained VT:  Stable on interrogation.    5.  Complete heart block:  Stable device function.   Current medicines are reviewed at length with the patient today.     Labs/ tests ordered today include: *** No orders of the defined types were placed in this encounter.    Disposition:   Follow up with  {Blank single:19197::"Dr. Allred","Dr. Arlan Organ. Klein","Dr. Camnitz","Dr. Lambert","EP APP"} in {Blank single:19197::"2 weeks","4 weeks","3 months","6 months","12 months","as usual post gen change"}    Signed, Shirley Friar, PA-C  10/12/2021 4:40 PM  [facility] [address] [place] [address] [phone] (office) [phone] (fax)

## 2021-10-13 ENCOUNTER — Telehealth: Payer: Self-pay | Admitting: Cardiology

## 2021-10-13 ENCOUNTER — Encounter: Payer: Medicare Other | Admitting: Student

## 2021-10-13 DIAGNOSIS — N400 Enlarged prostate without lower urinary tract symptoms: Secondary | ICD-10-CM | POA: Diagnosis not present

## 2021-10-13 DIAGNOSIS — I110 Hypertensive heart disease with heart failure: Secondary | ICD-10-CM | POA: Diagnosis not present

## 2021-10-13 DIAGNOSIS — E785 Hyperlipidemia, unspecified: Secondary | ICD-10-CM | POA: Diagnosis not present

## 2021-10-13 DIAGNOSIS — I480 Paroxysmal atrial fibrillation: Secondary | ICD-10-CM | POA: Diagnosis not present

## 2021-10-13 DIAGNOSIS — F319 Bipolar disorder, unspecified: Secondary | ICD-10-CM | POA: Diagnosis not present

## 2021-10-13 DIAGNOSIS — R2689 Other abnormalities of gait and mobility: Secondary | ICD-10-CM | POA: Diagnosis not present

## 2021-10-13 DIAGNOSIS — I509 Heart failure, unspecified: Secondary | ICD-10-CM | POA: Diagnosis not present

## 2021-10-13 DIAGNOSIS — Z7901 Long term (current) use of anticoagulants: Secondary | ICD-10-CM | POA: Diagnosis not present

## 2021-10-13 DIAGNOSIS — J449 Chronic obstructive pulmonary disease, unspecified: Secondary | ICD-10-CM | POA: Diagnosis not present

## 2021-10-13 DIAGNOSIS — R54 Age-related physical debility: Secondary | ICD-10-CM | POA: Diagnosis not present

## 2021-10-13 NOTE — Telephone Encounter (Signed)
°  1. Has your device fired? NO  2. Is you device beeping? N/A  3. Are you experiencing draining or swelling at device site? N/A  4. Are you calling to see if we received your device transmission? RESCHEDULE TODAY'S APPT  5. Have you passed out? N/A   PT'S BROTHER ROBERT  (DPR) CALLED TO ADVISE THAT PT HAS BEEN IN A NURSING FACILITY FOR 3 MONTHS, HE WILL BE TRANSPORTING PT TO ANOTHER FACILITY TOMORROW MORNING THEN HE WILL GET THE REMAINDER OF THE DEVICE AND PLUG IT UP BESIDE HIS BED AT [place].     Please route to [place]

## 2021-10-18 ENCOUNTER — Inpatient Hospital Stay: Payer: Medicare Other | Admitting: Pulmonary Disease

## 2021-10-18 DIAGNOSIS — R54 Age-related physical debility: Secondary | ICD-10-CM | POA: Diagnosis not present

## 2021-10-18 DIAGNOSIS — F319 Bipolar disorder, unspecified: Secondary | ICD-10-CM | POA: Diagnosis not present

## 2021-10-18 DIAGNOSIS — R059 Cough, unspecified: Secondary | ICD-10-CM | POA: Diagnosis not present

## 2021-10-18 DIAGNOSIS — J449 Chronic obstructive pulmonary disease, unspecified: Secondary | ICD-10-CM | POA: Diagnosis not present

## 2021-10-18 DIAGNOSIS — I509 Heart failure, unspecified: Secondary | ICD-10-CM | POA: Diagnosis not present

## 2021-11-04 ENCOUNTER — Telehealth: Payer: Self-pay | Admitting: Cardiology

## 2021-11-04 NOTE — Telephone Encounter (Signed)
New message   Pt brother would like a call about procedure that is too be discussed. He said he thought it was to replace his device but not says discuss cardioversion. He wants to know if the assisted living he lives in can fax over results from labs and he do virtual visit because pts mobility is limited. RN stated that pt would need to come in for the labs and it's been more than 6 months since last being seen. Please call.

## 2021-11-04 NOTE — Progress Notes (Deleted)
Electrophysiology Office Note Date: 11/04/2021  ID:  Dorris Vangorder, DOB 1941/08/28, MRN [id number]  PCP: [name], MD Primary Cardiologist: Will [name], MD Electrophysiologist: Constance Haw, MD ***  CC: Routine ICD follow-up  Mike Gray is a 81 y.o. male seen today for Will [name], MD for {Blank single:19197::"cardiac clearance","post hospital follow up","acute visit due to ***","routine electrophysiology followup"}.  Since {Blank single:19197::"last being seen in our clinic","discharge from hospital"} the patient reports doing ***.  he denies chest pain, palpitations, dyspnea, PND, orthopnea, nausea, vomiting, dizziness, syncope, edema, weight gain, or early satiety. {He/she (caps):30048} has not had ICD shocks.   Device History: {Blank single:19197::"Medtronic","St. Jude","Boston Scientific","Biotronik"} {Blank single:19197::"Dual Chamber","Single Chamber","BiV","S-ICD"} ICD implanted *** for *** History of appropriate therapy: {yes/no:20286} History of AAD therapy: {Blank single:19197::"No","Yes; currently on ***","Yes; previously tolerated ***"}   Past Medical History:  Diagnosis Date   Angiodysplasia    Atrial fibrillation (HCC)    CHF (congestive heart failure) (HCC)    COPD (chronic obstructive pulmonary disease) ([place])    GI bleed    Hypertension    Past Surgical History:  Procedure Laterality Date   COLONOSCOPY     PACEMAKER IMPLANT      Current Outpatient Medications  Medication Sig Dispense Refill   amiodarone (PACERONE) 100 MG tablet Take 1 tablet (100 mg total) by mouth daily.     apixaban (ELIQUIS) 5 MG TABS tablet Take 1 tablet (5 mg total) by mouth 2 (two) times daily. 60 tablet 5   atorvastatin (LIPITOR) 20 MG tablet Take 20 mg by mouth daily.     budesonide (PULMICORT) 0.5 MG/2ML nebulizer solution Take 2 mLs (0.5 mg total) by nebulization 2 (two) times daily. 120 mL 0   divalproex (DEPAKOTE ER) 500 MG 24 hr tablet Take 1,000  mg by mouth daily.      finasteride (PROSCAR) 5 MG tablet Take 5 mg by mouth daily.     ipratropium-albuterol (DUONEB) 0.5-2.5 (3) MG/3ML SOLN Take 3 mLs by nebulization 3 (three) times daily. 360 mL    ipratropium-albuterol (DUONEB) 0.5-2.5 (3) MG/3ML SOLN Take 3 mLs by nebulization every 2 (two) hours as needed. 360 mL    methylPREDNISolone sodium succinate (SOLU-MEDROL) 40 mg/mL injection Inject 1 mL (40 mg total) into the vein 3 (three) times daily. 1 each 0   tamsulosin (FLOMAX) 0.4 MG CAPS capsule Take 1 capsule (0.4 mg total) by mouth daily after supper. (Patient taking differently: Take 0.8 mg by mouth daily after supper.) 30 capsule 0   No current facility-administered medications for this visit.    Allergies:   Patient has no known allergies.   Social History: Social History   Socioeconomic History   Marital status: Divorced    Spouse name: Not on file   Number of children: Not on file   Years of education: Not on file   Highest education level: Not on file  Occupational History   Not on file  Tobacco Use   Smoking status: Former    Types: Cigarettes    Quit date: 2013    Years since quitting: 10.0   Smokeless tobacco: Never  Vaping Use   Vaping Use: Never used  Substance and Sexual Activity   Alcohol use: Not Currently   Drug use: Never   Sexual activity: Not on file  Other Topics Concern   Not on file  Social History Narrative   Not on file   Social Determinants of Health   Financial Resource Strain: Not on  file  Food Insecurity: Not on file  Transportation Needs: Not on file  Physical Activity: Not on file  Stress: Not on file  Social Connections: Not on file  Intimate Partner Violence: Not on file    Family History: Family History  Problem Relation Age of Onset   Healthy Mother    Breast cancer Father 61   Cancer Other     Review of Systems: All other systems reviewed and are otherwise negative except as noted above.   Physical Exam: There  were no vitals filed for this visit.   GEN- The patient is well appearing, alert and oriented x 3 today.   HEENT: normocephalic, atraumatic; sclera clear, conjunctiva pink; hearing intact; oropharynx clear; neck supple, no JVP Lymph- no cervical lymphadenopathy Lungs- Clear to ausculation bilaterally, normal work of breathing.  No wheezes, rales, rhonchi Heart- Regular rate and rhythm, no murmurs, rubs or gallops, PMI not laterally displaced GI- soft, non-tender, non-distended, bowel sounds present, no hepatosplenomegaly Extremities- no clubbing or cyanosis. No edema; DP/PT/radial pulses 2+ bilaterally MS- no significant deformity or atrophy Skin- warm and dry, no rash or lesion; ICD pocket well healed Psych- euthymic mood, full affect Neuro- strength and sensation are intact  ICD interrogation- reviewed in detail today,  See PACEART report  EKG:  EKG {ACTION; IS/IS VAN:[id number]} ordered today. Personal review of EKG ordered {Blank single:19197::"today","***"} shows ***  Recent Labs: 07/19/2021: B Natriuretic Peptide 3,772.3 07/29/2021: TSH 0.512 08/08/2021: ALT 32 08/10/2021: BUN 32; Creatinine, Ser 1.10; Hemoglobin 13.1; Magnesium 2.1; Platelets 130; Potassium 4.0 08/11/2021: Sodium 133   Wt Readings from Last 3 Encounters:  07/27/21 171 lb 15.3 oz (78 kg)  02/28/21 159 lb (72.1 kg)  08/26/20 174 lb 12.8 oz (79.3 kg)     Other studies Reviewed: Additional studies/ records that were reviewed today include: Previous EP office notes.   Assessment and Plan:  1.  Chronic systolic dysfunction s/p {Blank single:19197::"Medtronic","St. Jude","Boston Scientific","Biotronik"} {Blank single:19197::"***","single chamber ICD","dual chamber ICD","CRT-D","S-ICD"}  euvolemic today Stable on an appropriate medical regimen Normal ICD function See Pace Art report No changes today  2. CHB  3. Paroxysmal atrial fibrillation Continue Eliquis for CHA2DS2VASc of 3 Continue amidoarone  4.  NSVT   Current medicines are reviewed at length with the patient today.   =  Labs/ tests ordered today include: *** No orders of the defined types were placed in this encounter.    Disposition:   Follow up with {Blank single:19197::"Dr. Allred","Dr. Arlan Organ. Klein","Dr. Camnitz","Dr. Lambert","EP APP"} in {Blank single:19197::"2 weeks","4 weeks","3 months","6 months","12 months","as usual post gen change"}    Signed, Shirley Friar, PA-C  11/04/2021 10:04 AM  [facility] [address] [place] [address] [phone] (office) [phone] (fax)

## 2021-11-04 NOTE — Telephone Encounter (Signed)
Answered all questions about appt. Discussed appt for Persistent AF since 07/22/21 from transmission from device on 09/12/21. Advised appt needed for labs, hasn't been seen in person for over 6 months, possible cardioversion needed if still in AF. The brother thought the appointment was for device change out related to battery. Advised there are remote battery checks that have been set up because the battery was at 7 months at prior check. It has not indicated replacement yet but that maybe soon as well.  Brother said they will come in person on Monday, gave appt date and time.   When he gets with the patient later today he is going to call the device clinic so they can assist in sending in a transmission today to see if the patient is still in Afib.   Will forward to device as FYI about call for transmission.

## 2021-11-04 NOTE — Telephone Encounter (Signed)
Left VM for patient's brother [name] advising that transmission has not been received, it appears that monitor is disconnected. Provided device clinic # and hours to return call.

## 2021-11-04 NOTE — Telephone Encounter (Signed)
I spoke with Evern Core and he states he have not made it to the nursing home to plug in the patient monitor. He states he will send the transmission tomorrow and agreed to keep the appointment with [facility] Monday.

## 2021-11-07 ENCOUNTER — Telehealth: Payer: Self-pay

## 2021-11-07 ENCOUNTER — Encounter: Payer: Medicare Other | Admitting: Student

## 2021-11-07 NOTE — Telephone Encounter (Signed)
The patient brother states the patient got sick over the weekend and will not be able to make his appointment with [name]. He requested that the appointment be rescheduled for the afternoon on a Tuesday or Thursday. I rescheduled him for 11-25-2021 at 12 pm.

## 2021-11-07 NOTE — Telephone Encounter (Signed)
Transmission has not been received to date. Spoke with patient's brother who attempted to send remote transmission yesterday however got code [id number] on remote monitor. Brother is provided [employer]tr[employer] contact at [phone] and advised to call tomorrow as [employer] is closed today for holiday. Of note patient was schedule with A. Tillery, PA however developed symptoms of a viral infection last evening and is to be tested for Covid today. Appointment has been rescheduled for 2/2. Patient lives in a nursing home and will continue to be monitored for cardiac symptoms. Brother will send manual transmission once new handheld is received. Will close this encounter.

## 2021-11-14 ENCOUNTER — Ambulatory Visit (INDEPENDENT_AMBULATORY_CARE_PROVIDER_SITE_OTHER): Payer: Medicare Other

## 2021-11-14 DIAGNOSIS — I422 Other hypertrophic cardiomyopathy: Secondary | ICD-10-CM

## 2021-11-15 ENCOUNTER — Inpatient Hospital Stay: Payer: Medicare Other | Admitting: Pulmonary Disease

## 2021-11-16 LAB — CUP PACEART REMOTE DEVICE CHECK
Battery Remaining Longevity: 6 mo
Battery Voltage: 2.81 V
Brady Statistic AP VP Percent: 65.11 %
Brady Statistic AP VS Percent: 0.13 %
Brady Statistic AS VP Percent: 34.58 %
Brady Statistic AS VS Percent: 0.18 %
Brady Statistic RA Percent Paced: 63.28 %
Brady Statistic RV Percent Paced: 99.45 %
Date Time Interrogation Session: 20230124163307
HighPow Impedance: 77 Ohm
Implantable Lead Implant Date: 20070328
Implantable Lead Implant Date: 20150323
Implantable Lead Location: 753859
Implantable Lead Location: 753860
Implantable Lead Model: 5076
Implantable Pulse Generator Implant Date: 20150323
Lead Channel Impedance Value: 304 Ohm
Lead Channel Impedance Value: 342 Ohm
Lead Channel Impedance Value: 456 Ohm
Lead Channel Pacing Threshold Amplitude: 0.5 V
Lead Channel Pacing Threshold Amplitude: 0.75 V
Lead Channel Pacing Threshold Pulse Width: 0.4 ms
Lead Channel Pacing Threshold Pulse Width: 0.4 ms
Lead Channel Sensing Intrinsic Amplitude: 2.125 mV
Lead Channel Sensing Intrinsic Amplitude: 2.125 mV
Lead Channel Sensing Intrinsic Amplitude: 5.375 mV
Lead Channel Sensing Intrinsic Amplitude: 5.375 mV
Lead Channel Setting Pacing Amplitude: 2 V
Lead Channel Setting Pacing Amplitude: 2.5 V
Lead Channel Setting Pacing Pulse Width: 0.4 ms
Lead Channel Setting Sensing Sensitivity: 0.3 mV

## 2021-11-24 ENCOUNTER — Other Ambulatory Visit: Payer: Self-pay

## 2021-11-24 ENCOUNTER — Ambulatory Visit (INDEPENDENT_AMBULATORY_CARE_PROVIDER_SITE_OTHER): Payer: Medicare Other | Admitting: Student

## 2021-11-24 ENCOUNTER — Encounter: Payer: Self-pay | Admitting: Internal Medicine

## 2021-11-24 ENCOUNTER — Encounter: Payer: Self-pay | Admitting: Student

## 2021-11-24 ENCOUNTER — Ambulatory Visit (INDEPENDENT_AMBULATORY_CARE_PROVIDER_SITE_OTHER): Payer: Medicare Other | Admitting: Internal Medicine

## 2021-11-24 VITALS — BP 108/64 | HR 68 | Temp 97.8°F | Ht 71.0 in | Wt 154.6 lb

## 2021-11-24 VITALS — BP 120/74 | HR 67 | Ht 69.0 in | Wt 150.0 lb

## 2021-11-24 DIAGNOSIS — I480 Paroxysmal atrial fibrillation: Secondary | ICD-10-CM

## 2021-11-24 DIAGNOSIS — I422 Other hypertrophic cardiomyopathy: Secondary | ICD-10-CM

## 2021-11-24 DIAGNOSIS — I4729 Other ventricular tachycardia: Secondary | ICD-10-CM

## 2021-11-24 DIAGNOSIS — J449 Chronic obstructive pulmonary disease, unspecified: Secondary | ICD-10-CM

## 2021-11-24 DIAGNOSIS — I5042 Chronic combined systolic (congestive) and diastolic (congestive) heart failure: Secondary | ICD-10-CM | POA: Diagnosis not present

## 2021-11-24 LAB — COMPREHENSIVE METABOLIC PANEL
ALT: 15 IU/L (ref 0–44)
AST: 22 IU/L (ref 0–40)
Albumin/Globulin Ratio: 1.7 (ref 1.2–2.2)
Albumin: 4 g/dL (ref 3.7–4.7)
Alkaline Phosphatase: 54 IU/L (ref 44–121)
BUN/Creatinine Ratio: 26 — ABNORMAL HIGH (ref 10–24)
BUN: 39 mg/dL — ABNORMAL HIGH (ref 8–27)
Bilirubin Total: 0.2 mg/dL (ref 0.0–1.2)
CO2: 27 mmol/L (ref 20–29)
Calcium: 10.3 mg/dL — ABNORMAL HIGH (ref 8.6–10.2)
Chloride: 102 mmol/L (ref 96–106)
Creatinine, Ser: 1.51 mg/dL — ABNORMAL HIGH (ref 0.76–1.27)
Globulin, Total: 2.4 g/dL (ref 1.5–4.5)
Glucose: 83 mg/dL (ref 70–99)
Potassium: 5 mmol/L (ref 3.5–5.2)
Sodium: 141 mmol/L (ref 134–144)
Total Protein: 6.4 g/dL (ref 6.0–8.5)
eGFR: 46 mL/min/{1.73_m2} — ABNORMAL LOW (ref >59)

## 2021-11-24 LAB — CUP PACEART INCLINIC DEVICE CHECK
Battery Remaining Longevity: 5 mo
Battery Voltage: 2.79 V
Brady Statistic AP VP Percent: 71.83 %
Brady Statistic AP VS Percent: 0.13 %
Brady Statistic AS VP Percent: 27.5 %
Brady Statistic AS VS Percent: 0.54 %
Brady Statistic RA Percent Paced: 71.23 %
Brady Statistic RV Percent Paced: 98.9 %
Date Time Interrogation Session: 20230202123253
HighPow Impedance: 72 Ohm
Implantable Lead Implant Date: 20070328
Implantable Lead Implant Date: 20150323
Implantable Lead Location: 753859
Implantable Lead Location: 753860
Implantable Lead Model: 5076
Implantable Pulse Generator Implant Date: 20150323
Lead Channel Impedance Value: 342 Ohm
Lead Channel Impedance Value: 399 Ohm
Lead Channel Impedance Value: 475 Ohm
Lead Channel Pacing Threshold Amplitude: 0.375 V
Lead Channel Pacing Threshold Amplitude: 0.75 V
Lead Channel Pacing Threshold Pulse Width: 0.4 ms
Lead Channel Pacing Threshold Pulse Width: 0.4 ms
Lead Channel Sensing Intrinsic Amplitude: 2.25 mV
Lead Channel Sensing Intrinsic Amplitude: 2.625 mV
Lead Channel Sensing Intrinsic Amplitude: 5.375 mV
Lead Channel Sensing Intrinsic Amplitude: 5.375 mV
Lead Channel Setting Pacing Amplitude: 2 V
Lead Channel Setting Pacing Amplitude: 2.5 V
Lead Channel Setting Pacing Pulse Width: 0.4 ms
Lead Channel Setting Sensing Sensitivity: 0.3 mV

## 2021-11-24 LAB — CBC
Hematocrit: 32.7 % — ABNORMAL LOW (ref 37.5–51.0)
Hemoglobin: 10.9 g/dL — ABNORMAL LOW (ref 13.0–17.7)
MCH: 30.1 pg (ref 26.6–33.0)
MCHC: 33.3 g/dL (ref 31.5–35.7)
MCV: 90 fL (ref 79–97)
Platelets: 240 10*3/uL (ref 150–450)
RBC: 3.62 x10E6/uL — ABNORMAL LOW (ref 4.14–5.80)
RDW: 13.4 % (ref 11.6–15.4)
WBC: 5.2 10*3/uL (ref 3.4–10.8)

## 2021-11-24 LAB — TSH: TSH: 1.2 u[IU]/mL (ref 0.450–4.500)

## 2021-11-24 NOTE — Progress Notes (Signed)
Mike Gray    [id number]    01-03-1941  Primary Care Physician:[name], [name], MD Date of Appointment: 11/24/2021 Established Patient Visit  Chief complaint:   Chief Complaint  Patient presents with   Hospitalization [facility] in Sep - Oct 2022     HPI: Mike Gray is a 81 y.o. man with history of COPD, atrial fibrillation on amiodarone and eliquis and S/p ICD who was hospitalized and seen by [facility] the hospital at [facility] in September 2022. Had hypoxemic and hypercapnic respiratory failure requiring NIPVV with Bipap. He was discharged to [facility] and was weaned to room air and discharged to acute rehab hospital in [facility].  He was able to walk with a walker at discharge.  Discharged to home with home PT. Lives with his brother. Has been having falls at home.  Now at skilled nursing.   He had a Swallow evaluation in Dec 2022 which showd mild oropharyngeal dysphagia and aspiration risk.   Interval Updates:  Here with his brother who does most of the talking for him. Goal is to bring him home at some point.  Patient used to live in a condo in [place]. But moved in with his brother 2 years ago due to worsening dyspnea on exertion and difficulty with independent living.   He was diagnosed with COPD many years ago, cannot remember when. He has never had PFTs. Never seen a lung doctor.   He is not very active.  Smoked from age 36 to 70 x 1.5 ppd = 75 pack years. Still smoked on and off a little after the age of 47   I have reviewed the patient's family social and past medical history and updated as appropriate.   Past Medical History:  Diagnosis Date   Angiodysplasia    Atrial fibrillation (HCC)    CHF (congestive heart failure) (HCC)    COPD (chronic obstructive pulmonary disease) (HCC)    GI bleed    Hypertension     Past Surgical History:  Procedure Laterality Date   COLONOSCOPY     PACEMAKER IMPLANT      Family History   Problem Relation Age of Onset   Healthy Mother    Breast cancer Father 65   Cancer Other     Social History   Occupational History   Not on file  Tobacco Use   Smoking status: Former    Types: Cigarettes    Quit date: 2013    Years since quitting: 10.0   Smokeless tobacco: Never  Vaping Use   Vaping Use: Never used  Substance and Sexual Activity   Alcohol use: Not Currently   Drug use: Never   Sexual activity: Not on file     Physical Exam: Blood pressure 108/64, pulse 68, temperature 97.8 F (36.6 C), temperature source Oral, height 5\' 11"  (1.803 m), weight 154 lb 9.6 oz (70.1 kg), SpO2 100 %.  Gen:      No acute distress chronically ill appearing ENT:  no nasal polyps, mucus membranes moist Lungs:    diminished bilaterally, increased RR, no wheezes or crackles CV:         Regular rate and rhythm; no murmurs, rubs, or gallops.  No pedal edema   Data Reviewed: Imaging: I have personally reviewed the chest xray October 202 shows mild bibasilar airspace disease.  CTPe study shows upper lobe emphysema, bibasilar consolidations  PFTs: No flowsheet data found.  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 His brother and I spoke at length about the prognosis for COPD and likelihood for progression. He additionally has a stubbornness in wanting to do physical activity. Brother is hoping to one day take him home, but I'm not sure how realistic this is.  Will  proceed with PFTs to better understand disease severity. Fine to continue duoneb and Breo for now. May consider nebulizer treatments or a transition to trelegy/breztri in the future.   He is already DNR and DNI and has made his wishes known to his family.   I spent 35 minutes on 11/24/2021 in care of this patient including face to face time and non-face to face time spent charting, review of outside records, and coordination of care.   Return to Care: Return in about 4 months (around 03/24/2022).   Lenice Llamas, MD Pulmonary and [place]

## 2021-11-24 NOTE — Patient Instructions (Signed)
Medication Instructions:  Your physician recommends that you continue on your current medications as directed. Please refer to the Current Medication list given to you today.  *If you need a refill on your cardiac medications before your next appointment, please call your pharmacy*   Lab Work: TODAY: CMET, CBC, TSH  If you have labs (blood work) drawn today and your tests are completely normal, you will receive your results only by: [place] (if you have MyChart) OR A paper copy in the mail If you have any lab test that is abnormal or we need to change your treatment, we will call you to review the results.   Follow-Up: At [facility], you and your health needs are our priority.  As part of our continuing mission to provide you with exceptional heart care, we have created designated Provider Care Teams.  These Care Teams include your primary Cardiologist (physician) and Advanced Practice Providers (APPs -  Physician Assistants and Nurse Practitioners) who all work together to provide you with the care you need, when you need it.  We recommend signing up for the patient portal called "MyChart".  Sign up information is provided on this After Visit Summary.  MyChart is used to connect with patients for Virtual Visits (Telemedicine).  Patients are able to view lab/test results, encounter notes, upcoming appointments, etc.  Non-urgent messages can be sent to your provider as well.   To learn more about what you can do with MyChart, go to [email or web address].    Your next appointment:   6 month(s)  The format for your next appointment:   In Person  Provider:   You may see Will [name], MD or one of the following Advanced Practice Providers on your designated Care Team:   Tommye Standard, [place] Legrand Como [facility] [place], [place]

## 2021-11-24 NOTE — Patient Instructions (Addendum)
Please schedule follow up scheduled with myself in 2 months.  If my schedule is not open yet, we will contact you with a reminder closer to that time. Please call [phone] if you haven't heard from [place] a month before.   Before your next visit I would like you to have: Full set of PFTs  Continue Breo and Duoneb inhalers for now.    Understanding COPD   What is COPD? COPD stands for chronic obstructive pulmonary (lung) disease. COPD is a general term used for several lung diseases.  COPD is an umbrella term and encompasses other  common diseases in this group like chronic bronchitis and emphysema. Chronic asthma may also be included in this group. While some patients with COPD have only chronic bronchitis or emphysema, most patients have a combination of both.  You might hear these terms used in exchange for one another.   COPD adds to the work of the heart. Diseased lungs may reduce the amount of oxygen that goes to the blood. High blood pressure in blood vessels from the heart to the lungs makes it difficult for the heart to pump. Lung disease can also cause the body to produce too many red blood cells which may make the blood thicker and harder to pump.   Patients who have COPD with low oxygen levels may develop an enlarged heart (cor pulmonale). This condition weakens the heart and causes increased shortness of breath and swelling in the legs and feet.   Chronic bronchitis Chronic bronchitis is irritation and inflammation (swelling) of the lining in the bronchial tubes (air passages). The irritation causes coughing and an excess amount of mucus in the airways. The swelling makes it difficult to get air in and out of the lungs. The small, hair-like structures on the inside of the airways (called cilia) may be damaged by the irritation. The cilia are then unable to help clean mucus from the airways.  Bronchitis is generally considered to be chronic when you have: a productive cough (cough up  mucus) and shortness of breath that lasts about 3 months or more each year for 2 or more years in a row. Your doctor may define chronic bronchitis differently.   Emphysema Emphysema is the destruction, or breakdown, of the walls of the alveoli (air sacs) located at the end of the bronchial tubes. The damaged alveoli are not able to exchange oxygen and carbon dioxide between the lungs and the blood. The bronchioles lose their elasticity and collapse when you exhale, trapping air in the lungs. The trapped air keeps fresh air and oxygen from entering the lungs.   Who is affected by COPD? Emphysema and chronic bronchitis affect approximately 16 million people in the [place], or close to 11 percent of the population.   Symptoms of COPD  Shortness of breath  Shortness of breath with mild exercise (walking, using the stairs, etc.)  Chronic, productive cough (with mucus)  A feeling of "tightness" in the chest  Wheezing   What causes COPD? The two primary causes of COPD are cigarette smoking and alpha1-antitrypsin (AAT) deficiency. Air pollution and occupational dusts may also contribute to COPD, especially when the person exposed to these substances is a cigarette smoker.  Cigarette smoke causes COPD by irritating the airways and creating inflammation that narrows the airways, making it more difficult to breathe. Cigarette smoke also causes the cilia to stop working properly so mucus and trapped particles are not cleaned from the airways. As a result, chronic  cough and excess mucus production develop, leading to chronic bronchitis.  In some people, chronic bronchitis and infections can lead to destruction of the small airways, or emphysema.  AAT deficiency, an inherited disorder, can also lead to emphysema. Alpha antitrypsin (AAT) is a protective material produced in the liver and transported to the lungs to help combat inflammation. When there is not enough of the chemical AAT, the body is no  longer protected from an enzyme in the white blood cells.   How is COPD diagnosed?  To diagnose COPD, the physician needs to know: Do you smoke?  Have you had chronic exposure to dust or air pollutants?  Do other members of your family have lung disease?  Are you short of breath?  Do you get short of breath with exercise?  Do you have chronic cough and/or wheezing?  Do you cough up excess mucus?  To help with the diagnosis, the physician will conduct a thorough physical exam which includes:  Listening to your lungs and heart  Checking your blood pressure and pulse  Examining your nose and throat  Checking your feet and ankles for swelling   Laboratory and other tests Several laboratory and other tests are needed to confirm a diagnosis of COPD. These tests may include:  Chest X-ray to look for lung changes that could be caused by COPD   Spirometry and pulmonary function tests (PFTs) to determine lung volume and air flow  Pulse oximetry to measure the saturation of oxygen in the blood  Arterial blood gases (ABGs) to determine the amount of oxygen and carbon dioxide in the blood  Exercise testing to determine if the oxygen level in the blood drops during exercise   Treatment In the beginning stages of COPD, there is minimal shortness of breath that may be noticed only during exercise. As the disease progresses, shortness of breath may worsen and you may need to wear an oxygen device.   To help control other symptoms of COPD, the following treatments and lifestyle changes may be prescribed.  Quitting smoking  Avoiding cigarette smoke and other irritants  Taking medications including: a. bronchodilators b. anti-inflammatory agents c. oxygen d. antibiotics  Maintaining a healthy diet  Following a structured exercise program such as pulmonary rehabilitation Preventing respiratory infections  Controlling stress   If your COPD progresses, you may be eligible to be evaluated for lung  volume reduction surgery or lung transplantation. You may also be eligible to participate in certain clinical trials (research studies). Ask your health care providers about studies being conducted in your hospital.   What is the outlook? Although COPD can not be cured, its symptoms can be treated and your quality of life can be improved. Your prognosis or outlook for the future will depend on how well your lungs are functioning, your symptoms, and how well you respond to and follow your treatment plan.

## 2021-11-24 NOTE — Progress Notes (Signed)
Electrophysiology Office Note Date: 11/24/2021  ID:  Mike Gray, DOB February 13, 1941, MRN [id number]  PCP: Mike Amel, MD Primary Cardiologist: Mike [name], MD Electrophysiologist: Mike Haw, MD   CC: Routine ICD follow-up  Mike Gray is a 81 y.o. male seen today for Mike [name], MD for routine electrophysiology followup.  Since last being seen in our clinic the patient reports doing OK more recently.  He had a prolonged illness from October to December with AECOPD. His brother states he was "near death" multiple times. He is still working with PT to try and get stronger. He is not very active. SOB with more than mild exertion. He has not had ICD shocks.   Device History: MDT dual chamber ICD, RA lead is from 2007, RV lead is from 2015 Current device implanted 01/12/2014 He has abandoned RA and RV pacing leads and can not have MRIs  Past Medical History:  Diagnosis Date   Angiodysplasia    Atrial fibrillation (HCC)    CHF (congestive heart failure) (HCC)    COPD (chronic obstructive pulmonary disease) ([place])    GI bleed    Hypertension    Past Surgical History:  Procedure Laterality Date   COLONOSCOPY     PACEMAKER IMPLANT      Current Outpatient Medications  Medication Sig Dispense Refill   amiodarone (PACERONE) 100 MG tablet Take 1 tablet (100 mg total) by mouth daily.     apixaban (ELIQUIS) 5 MG TABS tablet Take 1 tablet (5 mg total) by mouth 2 (two) times daily. 60 tablet 5   atorvastatin (LIPITOR) 20 MG tablet Take 20 mg by mouth daily.     budesonide (PULMICORT) 0.5 MG/2ML nebulizer solution Take 2 mLs (0.5 mg total) by nebulization 2 (two) times daily. 120 mL 0   divalproex (DEPAKOTE ER) 500 MG 24 hr tablet Take 1,000 mg by mouth daily.      finasteride (PROSCAR) 5 MG tablet Take 5 mg by mouth daily.     ipratropium-albuterol (DUONEB) 0.5-2.5 (3) MG/3ML SOLN Take 3 mLs by nebulization 3 (three) times daily. 360 mL     ipratropium-albuterol (DUONEB) 0.5-2.5 (3) MG/3ML SOLN Take 3 mLs by nebulization every 2 (two) hours as needed. 360 mL    methylPREDNISolone sodium succinate (SOLU-MEDROL) 40 mg/mL injection Inject 1 mL (40 mg total) into the vein 3 (three) times daily. 1 each 0   tamsulosin (FLOMAX) 0.4 MG CAPS capsule Take 1 capsule (0.4 mg total) by mouth daily after supper. (Patient taking differently: Take 0.8 mg by mouth daily after supper.) 30 capsule 0   No current facility-administered medications for this visit.    Allergies:   Patient has no known allergies.   Social History: Social History   Socioeconomic History   Marital status: Divorced    Spouse name: Not on file   Number of children: Not on file   Years of education: Not on file   Highest education level: Not on file  Occupational History   Not on file  Tobacco Use   Smoking status: Former    Types: Cigarettes    Quit date: 2013    Years since quitting: 10.0   Smokeless tobacco: Never  Vaping Use   Vaping Use: Never used  Substance and Sexual Activity   Alcohol use: Not Currently   Drug use: Never   Sexual activity: Not on file  Other Topics Concern   Not on file  Social History Narrative   Not on file  Social Determinants of Health   Financial Resource Strain: Not on file  Food Insecurity: Not on file  Transportation Needs: Not on file  Physical Activity: Not on file  Stress: Not on file  Social Connections: Not on file  Intimate Partner Violence: Not on file    Family History: Family History  Problem Relation Age of Onset   Healthy Mother    Breast cancer Father 77   Cancer Other     Review of Systems: All other systems reviewed and are otherwise negative except as noted above.   Physical Exam: Vitals:   11/24/21 1152  Weight: 150 lb (68 kg)  Height: 5\' 9"  (1.753 m)     GEN- The patient is well appearing, alert and oriented x 3 today.   HEENT: normocephalic, atraumatic; sclera clear, conjunctiva  pink; hearing intact; oropharynx clear; neck supple, no JVP Lymph- no cervical lymphadenopathy Lungs- Clear to ausculation bilaterally, normal work of breathing.  No wheezes, rales, rhonchi Heart- Regular rate and rhythm, no murmurs, rubs or gallops, PMI not laterally displaced GI- soft, non-tender, non-distended, bowel sounds present, no hepatosplenomegaly Extremities- no clubbing or cyanosis. No edema; DP/PT/radial pulses 2+ bilaterally MS- no significant deformity or atrophy Skin- warm and dry, no rash or lesion; ICD pocket well healed Psych- euthymic mood, full affect Neuro- strength and sensation are intact  ICD interrogation- reviewed in detail today,  See PACEART report  EKG:  EKG is ordered today. Personal review of EKG ordered today shows AV dual pacing at 67 bpm  Recent Labs: 07/19/2021: B Natriuretic Peptide 3,772.3 07/29/2021: TSH 0.512 08/08/2021: ALT 32 08/10/2021: BUN 32; Creatinine, Ser 1.10; Hemoglobin 13.1; Magnesium 2.1; Platelets 130; Potassium 4.0 08/11/2021: Sodium 133   Wt Readings from Last 3 Encounters:  11/24/21 150 lb (68 kg)  07/27/21 171 lb 15.3 oz (78 kg)  02/28/21 159 lb (72.1 kg)     Other studies Reviewed: Additional studies/ records that were reviewed today include: Previous EP office notes.   Assessment and Plan:  1.  Chronic systolic dysfunction s/p [employer] dual chamber ICD  Echo 06/2020 LVEF 40-45% euvolemic today Stable on an appropriate medical regimen Normal ICD function See Pace Art report No changes today  2. PAF EKG today shows NSR as does device.  Continue eliquis for CHA2DS2-VASc of 3.  Continue amiodarone 100 mg daily Surveillance labs today.   3. HTN Stable on current regimen    Current medicines are reviewed at length with the patient today.    Labs/ tests ordered today include:  Orders Placed This Encounter  Procedures   Comprehensive metabolic panel   TSH   CBC     Disposition:   Follow up with Dr. Curt Gray  in 6 months to discuss gen change    Signed, Mike Friar, PA-C  11/24/2021 12:04 PM  [address] [place] [id number] [phone] (office) [phone] (fax)

## 2021-11-25 ENCOUNTER — Other Ambulatory Visit: Payer: Self-pay

## 2021-11-25 DIAGNOSIS — I5042 Chronic combined systolic (congestive) and diastolic (congestive) heart failure: Secondary | ICD-10-CM

## 2021-11-25 NOTE — Addendum Note (Signed)
Addended by: Cheri Kearns A on: 11/25/2021 09:47 AM   Modules accepted: Level of Service

## 2021-11-25 NOTE — Progress Notes (Signed)
Remote ICD transmission.   

## 2021-11-30 ENCOUNTER — Other Ambulatory Visit (INDEPENDENT_AMBULATORY_CARE_PROVIDER_SITE_OTHER): Payer: Medicare Other | Admitting: *Deleted

## 2021-11-30 DIAGNOSIS — I5042 Chronic combined systolic (congestive) and diastolic (congestive) heart failure: Secondary | ICD-10-CM

## 2021-11-30 DIAGNOSIS — I480 Paroxysmal atrial fibrillation: Secondary | ICD-10-CM

## 2021-12-09 ENCOUNTER — Other Ambulatory Visit: Payer: Self-pay

## 2021-12-09 ENCOUNTER — Other Ambulatory Visit: Payer: Medicare Other | Admitting: *Deleted

## 2021-12-09 DIAGNOSIS — I5042 Chronic combined systolic (congestive) and diastolic (congestive) heart failure: Secondary | ICD-10-CM

## 2021-12-10 LAB — BASIC METABOLIC PANEL
BUN/Creatinine Ratio: 18 (ref 10–24)
BUN: 24 mg/dL (ref 8–27)
CO2: 25 mmol/L (ref 20–29)
Calcium: 10 mg/dL (ref 8.6–10.2)
Chloride: 101 mmol/L (ref 96–106)
Creatinine, Ser: 1.35 mg/dL — ABNORMAL HIGH (ref 0.76–1.27)
Glucose: 113 mg/dL — ABNORMAL HIGH (ref 70–99)
Potassium: 4.3 mmol/L (ref 3.5–5.2)
Sodium: 140 mmol/L (ref 134–144)
eGFR: 53 mL/min/{1.73_m2} — ABNORMAL LOW (ref >59)

## 2021-12-13 ENCOUNTER — Telehealth: Payer: Self-pay | Admitting: Cardiology

## 2021-12-13 NOTE — Telephone Encounter (Signed)
Patients brother calling to go over his lab results. He stated that the blood work on 11/24/21 showed low hemoglobin and last time this was low he needed a blood transfusion. He had labs done again on 10/08/22 but it does not state anything about his hemoglobin so he is just wondering if he should be concerned. Please advise.

## 2021-12-13 NOTE — Telephone Encounter (Signed)
Left message to call back  

## 2021-12-14 NOTE — Telephone Encounter (Signed)
Spoke to brother. Informed that last hgb was slightly low, but nothing of a concern from our standpoint. Explained that we would closely monitor his kidney function as pt is close to qualifying for lower dose Eliquis if continues to worsen.

## 2021-12-15 ENCOUNTER — Ambulatory Visit (INDEPENDENT_AMBULATORY_CARE_PROVIDER_SITE_OTHER): Payer: Medicare Other

## 2021-12-15 DIAGNOSIS — I422 Other hypertrophic cardiomyopathy: Secondary | ICD-10-CM | POA: Diagnosis not present

## 2021-12-15 LAB — CUP PACEART REMOTE DEVICE CHECK
Battery Remaining Longevity: 3 mo
Battery Voltage: 2.8 V
Brady Statistic AP VP Percent: 97.94 %
Brady Statistic AP VS Percent: 0.14 %
Brady Statistic AS VP Percent: 1.91 %
Brady Statistic AS VS Percent: 0.01 %
Brady Statistic RA Percent Paced: 97.94 %
Brady Statistic RV Percent Paced: 99.57 %
Date Time Interrogation Session: 20230223012504
HighPow Impedance: 64 Ohm
Implantable Lead Implant Date: 20070328
Implantable Lead Implant Date: 20150323
Implantable Lead Location: 753859
Implantable Lead Location: 753860
Implantable Lead Model: 5076
Implantable Pulse Generator Implant Date: 20150323
Lead Channel Impedance Value: 304 Ohm
Lead Channel Impedance Value: 399 Ohm
Lead Channel Impedance Value: 475 Ohm
Lead Channel Pacing Threshold Amplitude: 0.5 V
Lead Channel Pacing Threshold Amplitude: 0.625 V
Lead Channel Pacing Threshold Pulse Width: 0.4 ms
Lead Channel Pacing Threshold Pulse Width: 0.4 ms
Lead Channel Sensing Intrinsic Amplitude: 1.25 mV
Lead Channel Sensing Intrinsic Amplitude: 1.25 mV
Lead Channel Sensing Intrinsic Amplitude: 6.25 mV
Lead Channel Sensing Intrinsic Amplitude: 6.25 mV
Lead Channel Setting Pacing Amplitude: 2 V
Lead Channel Setting Pacing Amplitude: 2.5 V
Lead Channel Setting Pacing Pulse Width: 0.4 ms
Lead Channel Setting Sensing Sensitivity: 0.3 mV

## 2021-12-20 NOTE — Progress Notes (Signed)
Remote ICD transmission.   

## 2021-12-27 ENCOUNTER — Telehealth: Payer: Self-pay

## 2021-12-27 NOTE — Telephone Encounter (Signed)
? ?  Patient Name: Mike Gray  ?DOB: 1940-10-25 ?MRN: [id number] ? ?Primary Cardiologist: Will [name], MD ? ?Chart reviewed as part of pre-operative protocol coverage. Cataract extractions are recognized in guidelines as low risk surgeries that do not typically require specific preoperative testing or holding of blood thinner therapy. Therefore, given past medical history and time since last visit, based on ACC/AHA guidelines, Mike Gray would be at acceptable risk for the planned procedure without further cardiovascular testing.  ? ?I will route this recommendation to the requesting party via Epic fax function and remove from pre-op pool. ? ?Please call with questions. ? ?Emmaline Life, NP-C ? ?  ?12/27/2021, 10:20 AM ?[facility] ?[address] ?Office [phone] Fax [phone] ? ?

## 2021-12-27 NOTE — Telephone Encounter (Signed)
? ?  Pre-operative Risk Assessment  ?  ?Patient Name: Mike Gray  ?DOB: [birth year] ?MRN: [id number] ?  ? ?Request for Surgical Clearance ? ? ?Procedure:   Cataract Extraction by PE, IOL-Right then Left ? ?Date of Surgery:  Clearance 01/05/22                              ?  ?Surgeon:  Dr Marca Ancona. Mince[place] ?Surgeon's Group or Practice Name:  [facility]  ?Phone number:  [phone] ext [phone] ?Fax number:  [phone] ? ? ?Type of Clearance Requested:   ?- Medical  ?- Pharmacy:  Hold Apixaban (Eliquis)   ?  ?Type of Anesthesia:   IV sedation ? ?Additional requests/questions:   N/A ? ?Signed, ?Ulice Brilliant T   ?12/27/2021, 9:54 AM  ? ?

## 2022-01-11 ENCOUNTER — Telehealth: Payer: Self-pay

## 2022-01-11 NOTE — Telephone Encounter (Signed)
Mike [name], MD  ?12/16/2021 11:04 AM EST   ?  ?Normal remote reviewed. Battery and lead parameters stable. 3 months until ERI. Optivol elevated. Enroll in [facility] clinic.  ? ? ?

## 2022-01-11 NOTE — Telephone Encounter (Signed)
Referred to ICM clinic by Dr Curt Bears due to abnormal Optivol.   Spoke with brother Isael Stille per [place].  Explained reason for call and reviewed 3/5 remote transmission.  Advised remote transmission is scheduled for 3/27 and will review fluid levels.  Pt has been on Lasix in the past but currently is not prescribed a diuretic.   Pt resides as ALF and brother is caretaker.   Brother checks on patient daily and will will evaluate if patient is having any fluid symptoms. Advised will call on 3/27 after reviewing remote transmission to discuss the results.  He reports brother is currently doing remarkably well at this time.   ?

## 2022-01-16 ENCOUNTER — Ambulatory Visit (INDEPENDENT_AMBULATORY_CARE_PROVIDER_SITE_OTHER): Payer: Medicare Other

## 2022-01-16 ENCOUNTER — Telehealth: Payer: Self-pay

## 2022-01-16 DIAGNOSIS — Z95810 Presence of automatic (implantable) cardiac defibrillator: Secondary | ICD-10-CM | POA: Diagnosis not present

## 2022-01-16 DIAGNOSIS — I5042 Chronic combined systolic (congestive) and diastolic (congestive) heart failure: Secondary | ICD-10-CM

## 2022-01-16 DIAGNOSIS — I422 Other hypertrophic cardiomyopathy: Secondary | ICD-10-CM

## 2022-01-16 NOTE — Telephone Encounter (Signed)
Remote ICM transmission received.  Attempted call to brother, Keon Pender per [facility] and left message per DPR to return call.   ? ?

## 2022-01-16 NOTE — Progress Notes (Signed)
EPIC Encounter for ICM Monitoring ? ?Patient Name: Mike Gray is a 81 y.o. male ?Date: 01/16/2022 ?Primary Care Physican: No primary care provider on file. ?Primary Cardiologist: Camnitz ?Electrophysiologist: Camnitz ?11/24/2021 Office Weight: 150 lbs  ? ?Battery ERI: 3 months ?     ? ?1st ICM remote transmission.  Attempted call to brother, Mike Gray per [facility] and unable to reach.  Left message to return call. Transmission reviewed.  ?  ?Optivol thoracic impedance suggesting possible fluid accumulation starting 1/23.   Fluid index > normal threshold since 2/5. ? ?Prescribed:  ?Not prescribed a diuretic ? ?Labs: ?11/24/2021 Creatinine 1.51, BUN 39, Potassium 4.0, Sodium 141, GFR 46 ? complete set of results can be found in Results Review. ? ?Recommendations: Unable to reach.   ? ?Follow-up plan: ICM clinic phone appointment on 01/23/2022 to recheck fluid levels.   91 day device clinic remote transmission 04/20/2022.   ? ?EP/Cardiology Office Visits:   Recall 05/23/2022 with [name].   ? ?Copy of ICM check sent to [name].  ? ?3 month ICM trend: 01/16/2022. ? ? ? ?12-14 Month ICM trend:  ? ? ? ?Mike Billings, RN ?01/16/2022 ?9:51 AM ? ?

## 2022-01-17 LAB — CUP PACEART REMOTE DEVICE CHECK
Battery Remaining Longevity: 3 mo
Battery Voltage: 2.79 V
Brady Statistic AP VP Percent: 97.86 %
Brady Statistic AP VS Percent: 0.28 %
Brady Statistic AS VP Percent: 1.84 %
Brady Statistic AS VS Percent: 0.02 %
Brady Statistic RA Percent Paced: 97.95 %
Brady Statistic RV Percent Paced: 99.15 %
Date Time Interrogation Session: 20230327044223
HighPow Impedance: 65 Ohm
Implantable Lead Implant Date: 20070328
Implantable Lead Implant Date: 20150323
Implantable Lead Location: 753859
Implantable Lead Location: 753860
Implantable Lead Model: 5076
Implantable Pulse Generator Implant Date: 20150323
Lead Channel Impedance Value: 304 Ohm
Lead Channel Impedance Value: 361 Ohm
Lead Channel Impedance Value: 456 Ohm
Lead Channel Pacing Threshold Amplitude: 0.625 V
Lead Channel Pacing Threshold Amplitude: 0.75 V
Lead Channel Pacing Threshold Pulse Width: 0.4 ms
Lead Channel Pacing Threshold Pulse Width: 0.4 ms
Lead Channel Sensing Intrinsic Amplitude: 1.125 mV
Lead Channel Sensing Intrinsic Amplitude: 1.125 mV
Lead Channel Sensing Intrinsic Amplitude: 8.875 mV
Lead Channel Sensing Intrinsic Amplitude: 8.875 mV
Lead Channel Setting Pacing Amplitude: 2 V
Lead Channel Setting Pacing Amplitude: 2.5 V
Lead Channel Setting Pacing Pulse Width: 0.4 ms
Lead Channel Setting Sensing Sensitivity: 0.3 mV

## 2022-01-17 NOTE — Progress Notes (Signed)
Attempted call back to brother after receiving a message stating patient is feeling fine and he does have a little ankle swelling.  Other than the ankle swelling, patient is feeling fine.  Left message will send copy of report to Dr Curt Bears for review.   ?

## 2022-01-23 ENCOUNTER — Ambulatory Visit (INDEPENDENT_AMBULATORY_CARE_PROVIDER_SITE_OTHER): Payer: Medicare Other

## 2022-01-23 DIAGNOSIS — Z95810 Presence of automatic (implantable) cardiac defibrillator: Secondary | ICD-10-CM

## 2022-01-23 DIAGNOSIS — I5042 Chronic combined systolic (congestive) and diastolic (congestive) heart failure: Secondary | ICD-10-CM

## 2022-01-23 NOTE — Progress Notes (Signed)
EPIC Encounter for ICM Monitoring ? ?Patient Name: Mike Gray is a 81 y.o. male ?Date: 01/23/2022 ?Primary Care Physican: No primary care provider on file. ?Primary Cardiologist: Camnitz ?Electrophysiologist: Camnitz ?11/24/2021 Office Weight: 150 lbs  ?  ?Battery ERI: 3 months ?                                                         ?  ?Transmission reviewed and unchanged since last ICM fluid level check. ?  ?Optivol thoracic impedance suggesting possible fluid accumulation starting 1/23.   Fluid index > normal threshold since 2/5. ?  ?Prescribed:  ?Not prescribed a diuretic ?  ?Labs: ?11/24/2021 Creatinine 1.51, BUN 39, Potassium 4.0, Sodium 141, GFR 46 ? complete set of results can be found in Results Review. ?  ?Recommendations:  No changes.  ?  ?Follow-up plan: ICM clinic phone appointment on 02/06/2022 to recheck fluid levels.   91 day device clinic remote transmission 04/20/2022.   ?  ?EP/Cardiology Office Visits:   Recall 05/23/2022 with [name].   ?  ?Copy of ICM check sent to [name]. ? ?3 month ICM trend: 01/23/2022. ? ? ? ?12-14 Month ICM trend:  ? ? ? ?[name], RN ?01/23/2022 ?11:11 AM ? ?

## 2022-01-27 NOTE — Progress Notes (Signed)
Remote ICD transmission.   

## 2022-01-27 NOTE — Addendum Note (Signed)
Addended by: Cheri Kearns A on: 01/27/2022 09:58 AM ? ? Modules accepted: Level of Service ? ?

## 2022-02-06 ENCOUNTER — Ambulatory Visit: Payer: Medicare Other

## 2022-02-06 DIAGNOSIS — I5042 Chronic combined systolic (congestive) and diastolic (congestive) heart failure: Secondary | ICD-10-CM

## 2022-02-06 DIAGNOSIS — Z95810 Presence of automatic (implantable) cardiac defibrillator: Secondary | ICD-10-CM

## 2022-02-08 NOTE — Progress Notes (Signed)
EPIC Encounter for ICM Monitoring ? ?Patient Name: Mike Gray is a 81 y.o. male ?Date: 02/08/2022 ?Primary Care Physican: No primary care provider on file. ?Primary Cardiologist: Camnitz ?Electrophysiologist: Camnitz ?11/24/2021 Office Weight: 150 lbs  ?  ?Battery ERI: 3 months ?                                                         ?  ?Spoke with brother, Mike Gray, per [place].  Pt is doing well and will be discharged from SNF to his brother's home next week.  Pt does not have any fluid symptoms.  He does have COPD. ?  ?Optivol thoracic impedance suggesting possible fluid accumulation starting 1/23.   Fluid index > normal threshold since 2/5. ?  ?Prescribed:  ?Not prescribed a diuretic ?  ?Labs: ?11/24/2021 Creatinine 1.51, BUN 39, Potassium 4.0, Sodium 141, GFR 46 ? complete set of results can be found in Results Review. ?  ?Recommendations:  Advised to limit salt intake.  Advised to call office for any fluid symptoms and reviewed symptoms to report.  ?  ?Follow-up plan: ICM clinic phone appointment on 02/20/2022.   91 day device clinic remote transmission 04/20/2022.   ?  ?EP/Cardiology Office Visits:   Recall 05/23/2022 with Dr. Curt Gray.   ?  ?Copy of ICM check sent to Dr. Curt Gray. ? ?3 month ICM trend: 02/06/2022. ? ? ? ?12-14 Month ICM trend:  ? ? ? ?Mike Billings, RN ?02/08/2022 ?2:18 PM ? ?

## 2022-02-14 ENCOUNTER — Ambulatory Visit (INDEPENDENT_AMBULATORY_CARE_PROVIDER_SITE_OTHER): Payer: Medicare Other | Admitting: Internal Medicine

## 2022-02-14 ENCOUNTER — Encounter: Payer: Self-pay | Admitting: *Deleted

## 2022-02-14 VITALS — BP 128/70 | HR 65 | Ht 71.0 in | Wt 154.0 lb

## 2022-02-14 DIAGNOSIS — J449 Chronic obstructive pulmonary disease, unspecified: Secondary | ICD-10-CM

## 2022-02-14 DIAGNOSIS — Z7189 Other specified counseling: Secondary | ICD-10-CM

## 2022-02-14 LAB — PULMONARY FUNCTION TEST
DL/VA % pred: 60 %
DL/VA: 2.35 ml/min/mmHg/L
DLCO cor % pred: 42 %
DLCO cor: 10.57 ml/min/mmHg
DLCO unc % pred: 42 %
DLCO unc: 10.57 ml/min/mmHg
FEF 25-75 Post: 0.53 L/sec
FEF 25-75 Pre: 0.37 L/sec
FEF2575-%Change-Post: 44 %
FEF2575-%Pred-Post: 25 %
FEF2575-%Pred-Pre: 17 %
FEV1-%Change-Post: 19 %
FEV1-%Pred-Post: 36 %
FEV1-%Pred-Pre: 30 %
FEV1-Post: 1.08 L
FEV1-Pre: 0.91 L
FEV1FVC-%Change-Post: 6 %
FEV1FVC-%Pred-Pre: 59 %
FEV6-%Change-Post: 14 %
FEV6-%Pred-Post: 58 %
FEV6-%Pred-Pre: 50 %
FEV6-Post: 2.28 L
FEV6-Pre: 1.99 L
FEV6FVC-%Change-Post: 2 %
FEV6FVC-%Pred-Post: 102 %
FEV6FVC-%Pred-Pre: 99 %
FVC-%Change-Post: 12 %
FVC-%Pred-Post: 57 %
FVC-%Pred-Pre: 50 %
FVC-Post: 2.4 L
FVC-Pre: 2.14 L
Post FEV1/FVC ratio: 45 %
Post FEV6/FVC ratio: 95 %
Pre FEV1/FVC ratio: 43 %
Pre FEV6/FVC Ratio: 93 %

## 2022-02-14 MED ORDER — STIOLTO RESPIMAT 2.5-2.5 MCG/ACT IN AERS: 2.0000 | INHALATION_SPRAY | Freq: Every day | RESPIRATORY_TRACT | 5 refills | Status: DC

## 2022-02-14 MED ORDER — ALBUTEROL SULFATE HFA 108 (90 BASE) MCG/ACT IN AERS: 2.0000 | INHALATION_SPRAY | Freq: Four times a day (QID) | RESPIRATORY_TRACT | 5 refills | Status: DC | PRN

## 2022-02-14 NOTE — Progress Notes (Signed)
Full PFT performed today. °

## 2022-02-14 NOTE — Progress Notes (Signed)
? ?      ?Mike Gray    [id number]    1941-05-04 ? ?Primary Care Physician:No primary care provider on file. ?Date of Appointment: 02/14/2022 ?Established Patient Visit ? ?Chief complaint:   ?Chief Complaint  ?Patient presents with  ? Follow-up  ?  F/U after PFT. Per brother his breathing has been stable since last visit.   ? ? ? ?HPI: ?Mike Gray is a 81 y.o. man with COPD.  ? ?Interval Updates: ?Here for follow up after PFTs which show very severe airflow limitation FEV1 30% of predicted.  ? ?Here with his brother.  ? ?He is being discharged from assisted living and will be moving in with his brother. Has been having therapy and brother is worried about him becoming deconditioned at home.  ? ?Taking symbicort twice a day at rehab. Brother nervous about taking over his care at home now.  ? ?Doing ok with his breathing on symbicort, denies ongoing chest tightness and wheezng, but he is not very active.  ? ?I have reviewed the patient's family social and past medical history and updated as appropriate.  ? ?Past Medical History:  ?Diagnosis Date  ? Angiodysplasia   ? Atrial fibrillation ([place])   ? CHF (congestive heart failure) ([place])   ? COPD (chronic obstructive pulmonary disease) ([place])   ? GI bleed   ? Hypertension   ? ? ?Past Surgical History:  ?Procedure Laterality Date  ? COLONOSCOPY    ? PACEMAKER IMPLANT    ? ? ?Family History  ?Problem Relation Age of Onset  ? Healthy Mother   ? Breast cancer Father 76  ? Cancer Other   ? ? ?Social History  ? ?Occupational History  ? Not on file  ?Tobacco Use  ? Smoking status: Former  ?  Packs/day: 1.00  ?  Years: 40.00  ?  Pack years: 40.00  ?  Types: Cigarettes  ?  Quit date: 2013  ?  Years since quitting: 10.3  ? Smokeless tobacco: Never  ?Vaping Use  ? Vaping Use: Never used  ?Substance and Sexual Activity  ? Alcohol use: Not Currently  ? Drug use: Never  ? Sexual activity: Not on file  ? ? ? ?Physical Exam: ?Blood pressure 128/70, pulse 65, height '5\' 11"'$  (1.803  m), weight 154 lb (69.9 kg), SpO2 95 %. ? ?Gen:      No acute distress ?ENT:  no nasal polyps, mucus membranes moist ?Lungs:    diminished, no increased effort, no wheezes or crackles ?CV:         Regular rate and rhythm; no murmurs, rubs, or gallops.  No pedal edema ? ? ?Data Reviewed: ?Imaging: ? ? ?PFTs: ? ? ?  Latest Ref Rng & Units 02/14/2022  ?  1:41 PM  ?PFT Results  ?FVC-Pre L 2.14  P  ?FVC-Predicted Pre % 50  P  ?FVC-Post L 2.40  P  ?FVC-Predicted Post % 57  P  ?Pre FEV1/FVC % % 43  P  ?Post FEV1/FCV % % 45  P  ?FEV1-Pre L 0.91  P  ?FEV1-Predicted Pre % 30  P  ?FEV1-Post L 1.08  P  ?DLCO uncorrected ml/min/mmHg 10.57  P  ?DLCO UNC% % 42  P  ?DLCO corrected ml/min/mmHg 10.57  P  ?DLCO COR %Predicted % 42  P  ?DLVA Predicted % 60  P  ?  ?P Preliminary result  ? ?I have personally reviewed the patient's PFTs and severe airflow limitation FEV 1 30% of  predicted ? ?Labs: ?Lab Results  ?Component Value Date  ? WBC 5.2 11/24/2021  ? HGB 10.9 (L) 11/24/2021  ? HCT 32.7 (L) 11/24/2021  ? MCV 90 11/24/2021  ? PLT 240 11/24/2021  ? ?Lab Results  ?Component Value Date  ? NA 140 12/09/2021  ? K 4.3 12/09/2021  ? CL 101 12/09/2021  ? CO2 25 12/09/2021  ? ? ?Immunization status: ?Immunization History  ?Administered Date(s) Administered  ? PFIZER(Purple Top)SARS-COV-2 Vaccination 11/29/2019, 12/24/2019  ? ? ?External Records Personally Reviewed:  ? 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  ? ?Plan/Recommendations: ?Will stop symbicort once he is home and switch to stiolto based on formulary recommendations.  ?Prn albuterol mdi or nebs at home up to 4 times a day as needed.  ?Referral to pulmonary rehab which he is in agreement to.  ? ?We discussed goals of care today for 18 minutes.MOST form completed and returned to patient and his brother. He does not want CPR or mechanical ventilation. He  would be ok with IV abx, fluids, oxygen and NIPPV. Avoid ICU. A copy was scanned into our records.  ? ? ?Return to Care: ?Return in about 3 months (around 05/16/2022). ? ? ?Lenice Llamas, MD ?Pulmonary and Critical Care Medicine ?[place] ?Office:[phone] ? ? ? ? ? ?

## 2022-02-14 NOTE — Patient Instructions (Signed)
Full PFT performed. 

## 2022-02-14 NOTE — Patient Instructions (Signed)
Please schedule follow up scheduled with myself in 3 months.  If my schedule is not open yet, we will contact you with a reminder closer to that time. Please call [phone] if you haven't heard from [place] a month before.  ? ?I am referring you to pulmonary rehab for exercise program at [facility] ? ?Take stiolto inhaler 2 puffs once a day in the morning.  ? ?You can take albuterol inhaler as a rescue medicine up to 4 times a day only if you need it. This is the same medicine that is in the nebulizer machine.  ? ? ?

## 2022-02-16 ENCOUNTER — Ambulatory Visit (INDEPENDENT_AMBULATORY_CARE_PROVIDER_SITE_OTHER): Payer: Medicare Other

## 2022-02-16 DIAGNOSIS — I422 Other hypertrophic cardiomyopathy: Secondary | ICD-10-CM

## 2022-02-16 LAB — CUP PACEART REMOTE DEVICE CHECK
Battery Remaining Longevity: 3 mo
Battery Voltage: 2.73 V
Brady Statistic AP VP Percent: 98.07 %
Brady Statistic AP VS Percent: 0.24 %
Brady Statistic AS VP Percent: 1.68 %
Brady Statistic AS VS Percent: 0.02 %
Brady Statistic RA Percent Paced: 98.12 %
Brady Statistic RV Percent Paced: 99 %
Date Time Interrogation Session: 20230427022603
HighPow Impedance: 67 Ohm
Implantable Lead Implant Date: 20070328
Implantable Lead Implant Date: 20150323
Implantable Lead Location: 753859
Implantable Lead Location: 753860
Implantable Lead Model: 5076
Implantable Pulse Generator Implant Date: 20150323
Lead Channel Impedance Value: 285 Ohm
Lead Channel Impedance Value: 361 Ohm
Lead Channel Impedance Value: 456 Ohm
Lead Channel Pacing Threshold Amplitude: 0.625 V
Lead Channel Pacing Threshold Amplitude: 0.625 V
Lead Channel Pacing Threshold Pulse Width: 0.4 ms
Lead Channel Pacing Threshold Pulse Width: 0.4 ms
Lead Channel Sensing Intrinsic Amplitude: 0.875 mV
Lead Channel Sensing Intrinsic Amplitude: 0.875 mV
Lead Channel Sensing Intrinsic Amplitude: 9.625 mV
Lead Channel Sensing Intrinsic Amplitude: 9.625 mV
Lead Channel Setting Pacing Amplitude: 2 V
Lead Channel Setting Pacing Amplitude: 2.5 V
Lead Channel Setting Pacing Pulse Width: 0.4 ms
Lead Channel Setting Sensing Sensitivity: 0.3 mV

## 2022-02-20 ENCOUNTER — Ambulatory Visit (INDEPENDENT_AMBULATORY_CARE_PROVIDER_SITE_OTHER): Payer: Medicare Other

## 2022-02-20 DIAGNOSIS — I5042 Chronic combined systolic (congestive) and diastolic (congestive) heart failure: Secondary | ICD-10-CM

## 2022-02-20 DIAGNOSIS — Z95810 Presence of automatic (implantable) cardiac defibrillator: Secondary | ICD-10-CM | POA: Diagnosis not present

## 2022-02-20 LAB — CUP PACEART REMOTE DEVICE CHECK
Battery Remaining Longevity: 2 mo
Battery Voltage: 2.75 V
Brady Statistic AP VP Percent: 97.78 %
Brady Statistic AP VS Percent: 0.31 %
Brady Statistic AS VP Percent: 1.89 %
Brady Statistic AS VS Percent: 0.03 %
Brady Statistic RA Percent Paced: 97.72 %
Brady Statistic RV Percent Paced: 98.71 %
Date Time Interrogation Session: 20230501022703
HighPow Impedance: 67 Ohm
Implantable Lead Implant Date: 20070328
Implantable Lead Implant Date: 20150323
Implantable Lead Location: 753859
Implantable Lead Location: 753860
Implantable Lead Model: 5076
Implantable Pulse Generator Implant Date: 20150323
Lead Channel Impedance Value: 304 Ohm
Lead Channel Impedance Value: 361 Ohm
Lead Channel Impedance Value: 456 Ohm
Lead Channel Pacing Threshold Amplitude: 0.625 V
Lead Channel Pacing Threshold Amplitude: 0.625 V
Lead Channel Pacing Threshold Pulse Width: 0.4 ms
Lead Channel Pacing Threshold Pulse Width: 0.4 ms
Lead Channel Sensing Intrinsic Amplitude: 0.5 mV
Lead Channel Sensing Intrinsic Amplitude: 0.5 mV
Lead Channel Sensing Intrinsic Amplitude: 9.625 mV
Lead Channel Sensing Intrinsic Amplitude: 9.625 mV
Lead Channel Setting Pacing Amplitude: 2 V
Lead Channel Setting Pacing Amplitude: 2.5 V
Lead Channel Setting Pacing Pulse Width: 0.4 ms
Lead Channel Setting Sensing Sensitivity: 0.3 mV

## 2022-02-24 ENCOUNTER — Telehealth: Payer: Self-pay

## 2022-02-24 NOTE — Progress Notes (Signed)
EPIC Encounter for ICM Monitoring ? ?Patient Name: Mike Gray is a 81 y.o. male ?Date: 02/24/2022 ?Primary Care Physican: No primary care provider on file. ?Primary Cardiologist: Camnitz ?Electrophysiologist: Camnitz ?11/24/2021 Office Weight: 150 lbs  ?  ?Battery ERI: 2 months ?                                                         ?  ?Attempted call to brother and unable to reach.  Left detailed message per DPR regarding transmission. Transmission reviewed.  ?  ?Optivol thoracic impedance suggesting possible fluid accumulation starting 1/23 but showing improvement since 4/24.   Fluid index > normal threshold since 2/5. ?  ?Prescribed:  ?Not prescribed a diuretic ?  ?Labs: ?11/24/2021 Creatinine 1.51, BUN 39, Potassium 4.0, Sodium 141, GFR 46 ? complete set of results can be found in Results Review. ?  ?Recommendations: Left voice mail with ICM number and encouraged to call if experiencing any fluid symptoms. ?  ?Follow-up plan: ICM clinic phone appointment on 03/27/2022.   91 day device clinic remote transmission 04/20/2022.   ?  ?EP/Cardiology Office Visits:   Recall 05/23/2022 with [name].   ?  ?Copy of ICM check sent to [name]. ? ?3 month ICM trend: 02/20/2022. ? ? ? ?12-14 Month ICM trend:  ? ? ? ?[name], RN ?02/24/2022 ?2:36 PM ? ?

## 2022-02-24 NOTE — Telephone Encounter (Signed)
Remote ICM transmission received.  Attempted call to patient regarding ICM remote transmission and left detailed message per DPR.  Advised to return call for any fluid symptoms or questions. Next ICM remote transmission scheduled 03/27/2022.   ? ?

## 2022-03-03 NOTE — Progress Notes (Signed)
Remote ICD transmission.   

## 2022-03-08 ENCOUNTER — Other Ambulatory Visit: Payer: Self-pay | Admitting: Cardiology

## 2022-03-08 MED ORDER — AMIODARONE HCL 100 MG PO TABS: 100.0000 mg | ORAL_TABLET | Freq: Every day | ORAL | 3 refills | Status: DC

## 2022-03-09 ENCOUNTER — Other Ambulatory Visit: Payer: Self-pay | Admitting: *Deleted

## 2022-03-09 MED ORDER — AMIODARONE HCL 200 MG PO TABS: 100.0000 mg | ORAL_TABLET | Freq: Every day | ORAL | 1 refills | Status: DC

## 2022-03-10 ENCOUNTER — Telehealth: Payer: Self-pay | Admitting: Cardiology

## 2022-03-10 NOTE — Telephone Encounter (Signed)
Pt c/o medication issue:  1. Name of Medication:  amiodarone (PACERONE) 200 MG tablet metoprolol succinate (TOPROL-XL) 24 hr tablet 50 mg  2. How are you currently taking this medication (dosage and times per day)?  metoprolol succinate (TOPROL-XL) 24 hr tablet 50 mg One tablet daily   amiodarone (PACERONE) 200 MG tablet0.5 tablet (100 mg) daily   3. Are you having a reaction (difficulty breathing--STAT)?   4. What is your medication issue? Robert (Brother) would like to confirm the dosages of the above medications with [name].   The patient was at [facility] in [place] after his discharge from the hospital. He was given the medications at the above dosages while at the facility, which is different than what the brother has been told by [name] to give him. The brother has metoprolol (TOPROL-XL) 200 MG 24 hr tablet as what he is taking now.  Please confirm dosages of medications  [name] will need new prescriptions to be written by DR. Camnitz as all the ones he has are expired. He was getting all of his medications ordered by the [facility]

## 2022-03-10 NOTE — Telephone Encounter (Signed)
Spoke with the patient's brother who states that the patient has been in the hospital and a rehab facility since last fall. He states that he needs a refill on amiodarone 200 mg daily as they only gave him 1 month supply when he was discharged from rehab.  He also states that at rehab they were giving him metoprolol succinate 50 mg daily. He states that he found a prescription from prior to the patient's hospitalization that was for metoprolol succinate 200 mg daily. He would like to know what the patient should be taking.  Metoprolol is no longer on the patient's medication list.  It looks like the 200 mg tablet was discontinued on 07/28/21 when patient was discharged. I am not sure when the 50 mg tablet was started or by whom. Advised that I would send a message to [name] for his recommendations on the metoprolol dosage.

## 2022-03-13 MED ORDER — METOPROLOL SUCCINATE ER 50 MG PO TB24: 50.0000 mg | ORAL_TABLET | Freq: Every day | ORAL | 3 refills | Status: DC

## 2022-03-13 NOTE — Telephone Encounter (Signed)
Left message for patient's brother to call back.

## 2022-03-13 NOTE — Telephone Encounter (Signed)
Called patient's brother (DPR) back about [name] recommendations. Per [name] okay with toprol XL 50 mg . Will place order for medication and send to pharmacy of choice. [name] verbalized understanding.

## 2022-03-13 NOTE — Telephone Encounter (Signed)
[name] is returning call.

## 2022-03-27 ENCOUNTER — Ambulatory Visit (INDEPENDENT_AMBULATORY_CARE_PROVIDER_SITE_OTHER): Payer: Medicare Other

## 2022-03-27 DIAGNOSIS — Z95810 Presence of automatic (implantable) cardiac defibrillator: Secondary | ICD-10-CM

## 2022-03-27 DIAGNOSIS — I5042 Chronic combined systolic (congestive) and diastolic (congestive) heart failure: Secondary | ICD-10-CM

## 2022-03-27 LAB — CUP PACEART REMOTE DEVICE CHECK
Battery Remaining Longevity: 1 mo
Battery Voltage: 2.75 V
Brady Statistic AP VP Percent: 98.78 %
Brady Statistic AP VS Percent: 0.16 %
Brady Statistic AS VP Percent: 1.05 %
Brady Statistic AS VS Percent: 0.01 %
Brady Statistic RA Percent Paced: 98.79 %
Brady Statistic RV Percent Paced: 98.95 %
Date Time Interrogation Session: 20230605001703
HighPow Impedance: 66 Ohm
Implantable Lead Implant Date: 20070328
Implantable Lead Implant Date: 20150323
Implantable Lead Location: 753859
Implantable Lead Location: 753860
Implantable Lead Model: 5076
Implantable Pulse Generator Implant Date: 20150323
Lead Channel Impedance Value: 304 Ohm
Lead Channel Impedance Value: 342 Ohm
Lead Channel Impedance Value: 418 Ohm
Lead Channel Pacing Threshold Amplitude: 0.625 V
Lead Channel Pacing Threshold Amplitude: 0.625 V
Lead Channel Pacing Threshold Pulse Width: 0.4 ms
Lead Channel Pacing Threshold Pulse Width: 0.4 ms
Lead Channel Sensing Intrinsic Amplitude: 0.5 mV
Lead Channel Sensing Intrinsic Amplitude: 0.5 mV
Lead Channel Sensing Intrinsic Amplitude: 6.5 mV
Lead Channel Sensing Intrinsic Amplitude: 6.5 mV
Lead Channel Setting Pacing Amplitude: 2 V
Lead Channel Setting Pacing Amplitude: 2.5 V
Lead Channel Setting Pacing Pulse Width: 0.4 ms
Lead Channel Setting Sensing Sensitivity: 0.3 mV

## 2022-03-28 ENCOUNTER — Telehealth: Payer: Self-pay

## 2022-03-28 NOTE — Progress Notes (Signed)
EPIC Encounter for ICM Monitoring  Patient Name: Mike Gray is a 81 y.o. male Date: 03/28/2022 Primary Care Physican: No primary care provider on file. Primary Cardiologist: [facility] Electrophysiologist: Curt Bears 11/24/2021 Office Weight: 150 lbs    Battery ERI: 1 month                                                            Attempted call to brother Mike Gray and unable to reach.  Left detailed message per DPR regarding transmission. Transmission reviewed.    Optivol thoracic impedance suggesting possible fluid accumulation starting 5/29.  Fluid levels did return to normal from 4/29-5/18 after extended time of suggesting possible fluid accumulation.   Prescribed:  Not prescribed a diuretic   Labs: 11/24/2021 Creatinine 1.51, BUN 39, Potassium 4.0, Sodium 141, GFR 46  complete set of results can be found in Results Review.   Recommendations: Left voice mail with ICM number and encouraged to call if experiencing any fluid symptoms.   Follow-up plan: ICM clinic phone appointment on 04/10/2022 to recheck fluid levels.   91 day device clinic remote transmission 04/20/2022.     EP/Cardiology Office Visits:   Recall 05/23/2022 with [name].     Copy of ICM check sent to [name].  3 month ICM trend: 03/27/2022.    12-14 Month ICM trend:     [name], RN 03/28/2022 9:56 AM

## 2022-03-28 NOTE — Telephone Encounter (Signed)
Remote ICM transmission received.  Attempted call to brother [name] per [facility] regarding ICM remote transmission and left detailed message per DPR to return call.

## 2022-03-31 ENCOUNTER — Telehealth: Payer: Self-pay | Admitting: Cardiology

## 2022-03-31 NOTE — Telephone Encounter (Signed)
Pt c/o medication issue:  1. Name of Medication: fluid pill  2. How are you currently taking this medication (dosage and times per day)? Not taking  3. Are you having a reaction (difficulty breathing--STAT)? no  4. What is your medication issue? [name] [name] calling to see if the patient could have a fluid pill for fluid build up. He states they got a call from Margarita Grizzle that he had a build up of fluid.

## 2022-03-31 NOTE — Telephone Encounter (Signed)
Gray, Mike Hassell Done, MD  You; Stanton Kidney, RN 3 hours ago (12:55 PM)    OptiVol only mildly elevated, not above the threshold for being significantly abnormal, and not significantly enough to require Lasix.  Patient needs to adhere to a low-salt diet.     [name] aware and Mike have pt pay close attention to sodium content in his diet.

## 2022-04-07 ENCOUNTER — Telehealth: Payer: Self-pay | Admitting: Cardiology

## 2022-04-07 NOTE — Telephone Encounter (Signed)
[name] called stating patient is on three different urinary rentention medications from different doctors. He wants to know if he needs to be on all three medication.

## 2022-04-07 NOTE — Telephone Encounter (Signed)
Returned call to patient's brother, [name], who had questions regarding patients medications: Flomax and Proscar.  Answered questions and provided education on medications. [name] verbalized understanding and had no further questions.

## 2022-04-10 ENCOUNTER — Ambulatory Visit (INDEPENDENT_AMBULATORY_CARE_PROVIDER_SITE_OTHER): Payer: Medicare Other

## 2022-04-10 ENCOUNTER — Telehealth: Payer: Self-pay

## 2022-04-10 DIAGNOSIS — I5042 Chronic combined systolic (congestive) and diastolic (congestive) heart failure: Secondary | ICD-10-CM

## 2022-04-10 DIAGNOSIS — Z95810 Presence of automatic (implantable) cardiac defibrillator: Secondary | ICD-10-CM

## 2022-04-10 NOTE — Progress Notes (Signed)
EPIC Encounter for ICM Monitoring  Patient Name: Mike Gray is a 81 y.o. male Date: 04/10/2022 Primary Care Physican: No primary care provider on file. Primary Cardiologist: [facility] Electrophysiologist: Curt Bears 11/24/2021 Office Weight: 150 lbs    Battery ERI: 1 month                                                            Attempted call to brother Todrick Siedschlag and unable to reach.  Left detailed message per DPR regarding transmission. Transmission reviewed.    Optivol thoracic impedance suggesting possible fluid accumulation starting 6/9.     Prescribed:  Not prescribed a diuretic   Labs: 11/24/2021 Creatinine 1.51, BUN 39, Potassium 4.0, Sodium 141, GFR 46  complete set of results can be found in Results Review.   Recommendations: Left voice mail with ICM number and encouraged to call if experiencing any fluid symptoms.   Follow-up plan: ICM clinic phone appointment on 04/17/2022 to recheck fluid levels.   91 day device clinic remote transmission 04/20/2022.     EP/Cardiology Office Visits:   Recall 05/23/2022 with [name].     Copy of ICM check sent to [name].  3 month ICM trend: 04/10/2022.    12-14 Month ICM trend:     [name], RN 04/10/2022 11:07 AM

## 2022-04-10 NOTE — Telephone Encounter (Signed)
Remote ICM transmission received.  Attempted call to brother [name] per [facility] regarding ICM remote transmission and left detailed message per DPR.  Advised to return call for any fluid symptoms or questions. Next ICM remote transmission scheduled 04/17/2022.

## 2022-04-17 ENCOUNTER — Telehealth: Payer: Self-pay

## 2022-04-17 ENCOUNTER — Ambulatory Visit (INDEPENDENT_AMBULATORY_CARE_PROVIDER_SITE_OTHER): Payer: Medicare Other

## 2022-04-17 DIAGNOSIS — I5042 Chronic combined systolic (congestive) and diastolic (congestive) heart failure: Secondary | ICD-10-CM

## 2022-04-17 DIAGNOSIS — Z95810 Presence of automatic (implantable) cardiac defibrillator: Secondary | ICD-10-CM

## 2022-04-17 NOTE — Telephone Encounter (Signed)
Remote ICM transmission received.  Attempted call to brother Molly Maduro, per [facility] regarding ICM remote transmission and left detailed message per DPR.  Advised to return call for any fluid symptoms or questions. Next ICM remote transmission scheduled 05/01/2022.

## 2022-04-18 ENCOUNTER — Ambulatory Visit (INDEPENDENT_AMBULATORY_CARE_PROVIDER_SITE_OTHER): Payer: Medicare Other | Admitting: Podiatry

## 2022-04-18 ENCOUNTER — Encounter: Payer: Self-pay | Admitting: Podiatry

## 2022-04-18 DIAGNOSIS — M79675 Pain in left toe(s): Secondary | ICD-10-CM

## 2022-04-18 DIAGNOSIS — B351 Tinea unguium: Secondary | ICD-10-CM

## 2022-04-18 DIAGNOSIS — M79674 Pain in right toe(s): Secondary | ICD-10-CM

## 2022-04-20 ENCOUNTER — Ambulatory Visit (INDEPENDENT_AMBULATORY_CARE_PROVIDER_SITE_OTHER): Payer: Medicare Other

## 2022-04-20 DIAGNOSIS — I422 Other hypertrophic cardiomyopathy: Secondary | ICD-10-CM

## 2022-04-20 LAB — CUP PACEART REMOTE DEVICE CHECK
Battery Remaining Longevity: 1 mo
Battery Voltage: 2.73 V
Brady Statistic AP VP Percent: 99.27 %
Brady Statistic AP VS Percent: 0.16 %
Brady Statistic AS VP Percent: 0.57 %
Brady Statistic AS VS Percent: 0.01 %
Brady Statistic RA Percent Paced: 99.36 %
Brady Statistic RV Percent Paced: 99.14 %
Date Time Interrogation Session: 20230629033525
HighPow Impedance: 69 Ohm
Implantable Lead Implant Date: 20070328
Implantable Lead Implant Date: 20150323
Implantable Lead Location: 753859
Implantable Lead Location: 753860
Implantable Lead Model: 5076
Implantable Pulse Generator Implant Date: 20150323
Lead Channel Impedance Value: 304 Ohm
Lead Channel Impedance Value: 342 Ohm
Lead Channel Impedance Value: 456 Ohm
Lead Channel Pacing Threshold Amplitude: 0.625 V
Lead Channel Pacing Threshold Amplitude: 0.625 V
Lead Channel Pacing Threshold Pulse Width: 0.4 ms
Lead Channel Pacing Threshold Pulse Width: 0.4 ms
Lead Channel Sensing Intrinsic Amplitude: 0.875 mV
Lead Channel Sensing Intrinsic Amplitude: 0.875 mV
Lead Channel Sensing Intrinsic Amplitude: 7.125 mV
Lead Channel Sensing Intrinsic Amplitude: 7.125 mV
Lead Channel Setting Pacing Amplitude: 2 V
Lead Channel Setting Pacing Amplitude: 2.5 V
Lead Channel Setting Pacing Pulse Width: 0.4 ms
Lead Channel Setting Sensing Sensitivity: 0.3 mV

## 2022-04-25 NOTE — Progress Notes (Signed)
  Subjective:  Patient ID: Mike Gray, male    DOB: 09/19/1941,  MRN: [id number]  81 y.o. male presents painful thick toenails that are difficult to trim. Pain interferes with ambulation. Aggravating factors include wearing enclosed shoe gear. Pain is relieved with periodic professional debridement.  New problem(s): None   PCP is [name], [name], MD.  No Known Allergies  Review of Systems: Negative except as noted in the HPI.   Objective:  Vascular Examination: Vascular status intact b/l with palpable pedal pulses. CFT immediate b/l. No edema. No pain with calf compression b/l. Skin temperature gradient WNL b/l. Pedal hair sparse.  Neurological Examination: Sensation grossly intact b/l with 10 gram monofilament. Vibratory sensation intact b/l.   Dermatological Examination: Pedal skin with normal turgor, texture and tone b/l. Toenails 1-5 b/l thick, discolored, elongated with subungual debris and pain on dorsal palpation. No hyperkeratotic lesions noted b/l.   Musculoskeletal Examination: Muscle strength 5/5 to b/l LE. No pain, crepitus or joint limitation noted with ROM bilateral LE. No gross bony deformities bilaterally.  Radiographs: None  Last A1c:      Latest Ref Rng & Units 07/31/2021    3:45 AM  Hemoglobin A1C  Hemoglobin-A1c 4.8 - 5.6 % 6.2     Assessment:   1. Pain due to onychomycosis of toenails of both feet    Plan:  -Patient was evaluated and treated. All patient's and/or POA's questions/concerns answered on today's visit. -Patient to continue soft, supportive shoe gear daily. -Mycotic toenails 1-5 bilaterally were debrided in length and girth with sterile nail nippers and dremel without incident. -Patient/POA to call should there be question/concern in the interim.  No follow-ups on file.  Marzetta Board, DPM

## 2022-04-27 ENCOUNTER — Telehealth (HOSPITAL_COMMUNITY): Payer: Self-pay

## 2022-04-27 NOTE — Telephone Encounter (Signed)
Called patient to see if he was interested in participating in the Pulmonary Rehab Program. [name] stated yes. Patient will come in for orientation on 05/12/2022'@1'$ :00pm and will attend the 1:15pm exercise class.   Tourist information centre manager.

## 2022-05-01 ENCOUNTER — Ambulatory Visit (INDEPENDENT_AMBULATORY_CARE_PROVIDER_SITE_OTHER): Payer: Medicare Other

## 2022-05-01 DIAGNOSIS — I5042 Chronic combined systolic (congestive) and diastolic (congestive) heart failure: Secondary | ICD-10-CM | POA: Diagnosis not present

## 2022-05-01 DIAGNOSIS — Z95810 Presence of automatic (implantable) cardiac defibrillator: Secondary | ICD-10-CM | POA: Diagnosis not present

## 2022-05-03 ENCOUNTER — Telehealth: Payer: Self-pay

## 2022-05-03 NOTE — Progress Notes (Signed)
EPIC Encounter for ICM Monitoring  Patient Name: Sebastien Jackson is a 81 y.o. male Date: 05/03/2022 Primary Care Physican: [name], MD Primary Cardiologist: [facility] Electrophysiologist: Curt Bears 11/24/2021 Office Weight: 150 lbs    Battery ERI: 1 month                                                            Attempted call to brother Westly Hinnant and unable to reach.  Left detailed message per DPR regarding transmission. Transmission reviewed.    Optivol thoracic impedance suggesting fluid levels returned to normal.     Prescribed:  Not prescribed a diuretic   Labs: 11/24/2021 Creatinine 1.51, BUN 39, Potassium 4.0, Sodium 141, GFR 46  complete set of results can be found in Results Review.   Recommendations:  Left voice mail with ICM number and encouraged to call if experiencing any fluid symptoms.   Follow-up plan: ICM clinic phone appointment on 05/01/2022.   91 day device clinic remote transmission 04/20/2022.     EP/Cardiology Office Visits:   Recall 05/23/2022 with [name].     Copy of ICM check sent to [name].  3 month ICM trend: 05/01/2022.    12-14 Month ICM trend:     [name], RN 05/03/2022 4:48 PM

## 2022-05-03 NOTE — Telephone Encounter (Signed)
Remote ICM transmission received.  Attempted call to brother [name] per [facility] regarding ICM remote transmission and left detailed message per DPR.  Advised to return call for any fluid symptoms or questions. Next ICM remote transmission scheduled 06/05/2022.

## 2022-05-08 NOTE — Progress Notes (Signed)
Remote ICD transmission.   

## 2022-05-12 ENCOUNTER — Encounter (HOSPITAL_COMMUNITY)
Admission: RE | Admit: 2022-05-12 | Discharge: 2022-05-12 | Disposition: A | Discharge status: Home / Self Care | Payer: Medicare Other | Source: Ambulatory Visit | Attending: Internal Medicine | Admitting: Internal Medicine

## 2022-05-12 ENCOUNTER — Encounter (HOSPITAL_COMMUNITY): Payer: Self-pay

## 2022-05-12 VITALS — BP 124/66 | HR 74 | Ht 71.0 in | Wt 159.0 lb

## 2022-05-12 DIAGNOSIS — J449 Chronic obstructive pulmonary disease, unspecified: Secondary | ICD-10-CM | POA: Diagnosis not present

## 2022-05-12 NOTE — Progress Notes (Signed)
Pulmonary Individual Treatment Plan  Patient Details  Name: Mike Gray MRN: [id number] Date of Birth: 1941/10/06 Referring Provider:   [name] Pulmonary Rehab Walk Test from 05/12/2022 in [place]  Referring Provider [name]       Initial Encounter Date:  Flowsheet Row Pulmonary Rehab Walk Test from 05/12/2022 in [place]  Date 05/12/22       Visit Diagnosis: Stage 4 very severe COPD by GOLD classification ([place])  Patient's Home Medications on Admission:   Current Outpatient Medications:    albuterol (VENTOLIN HFA) 108 (90 Base) MCG/ACT inhaler, Inhale into the lungs., Disp: , Rfl:    albuterol (VENTOLIN HFA) 108 (90 Base) MCG/ACT inhaler, Inhale 2 puffs into the lungs every 6 (six) hours as needed., Disp: 18 g, Rfl: 5   amiodarone (PACERONE) 200 MG tablet, Take 0.5 tablets (100 mg total) by mouth daily., Disp: 45 tablet, Rfl: 1   atorvastatin (LIPITOR) 20 MG tablet, Take 20 mg by mouth daily., Disp: , Rfl:    divalproex (DEPAKOTE ER) 500 MG 24 hr tablet, Take 1,000 mg by mouth daily. , Disp: , Rfl:    finasteride (PROSCAR) 5 MG tablet, Take 5 mg by mouth daily., Disp: , Rfl:    metoprolol succinate (TOPROL-XL) 50 MG 24 hr tablet, Take 1 tablet (50 mg total) by mouth daily. Take with or immediately following a meal., Disp: 90 tablet, Rfl: 3   niacin 500 MG tablet, Take 500 mg by mouth at bedtime., Disp: , Rfl:    tamsulosin (FLOMAX) 0.4 MG CAPS capsule, Take 1 capsule (0.4 mg total) by mouth daily after supper. (Patient taking differently: Take 0.8 mg by mouth daily after supper.), Disp: 30 capsule, Rfl: 0   Tiotropium Bromide-Olodaterol (STIOLTO RESPIMAT) 2.5-2.5 MCG/ACT AERS, Inhale 2 puffs into the lungs daily., Disp: 4 g, Rfl: 5   apixaban (ELIQUIS) 5 MG TABS tablet, Take 1 tablet (5 mg total) by mouth 2 (two) times daily. (Patient not taking: Reported on 05/12/2022), Disp: 60 tablet, Rfl: 5   budesonide  (PULMICORT) 0.5 MG/2ML nebulizer solution, Take 2 mLs (0.5 mg total) by nebulization 2 (two) times daily. (Patient not taking: Reported on 02/14/2022), Disp: 120 mL, Rfl: 0   methylPREDNISolone sodium succinate (SOLU-MEDROL) 40 mg/mL injection, Inject 1 mL (40 mg total) into the vein 3 (three) times daily. (Patient not taking: Reported on 05/12/2022), Disp: 1 each, Rfl: 0  Past Medical History: Past Medical History:  Diagnosis Date   Angiodysplasia    Atrial fibrillation (HCC)    CHF (congestive heart failure) (HCC)    COPD (chronic obstructive pulmonary disease) (HCC)    GI bleed    Hypertension     Tobacco Use: Social History   Tobacco Use  Smoking Status Former   Packs/day: 1.00   Years: 40.00   Total pack years: 40.00   Types: Cigarettes   Quit date: 06/2021   Years since quitting: 0.8  Smokeless Tobacco Never    Labs: Review Flowsheet  More data exists      Latest Ref Rng & Units 07/06/2020 07/07/2020 07/19/2021 07/29/2021 07/31/2021  Labs for ITP Cardiac and Pulmonary Rehab  Cholestrol 0 - 200 mg/dL - - - 87  -  LDL (calc) 0 - 99 mg/dL - - - 40  -  HDL-C >40 mg/dL - - - 34  -  Trlycerides <150 mg/dL - - - 63  -  Hemoglobin A1c 4.8 - 5.6 % - - - -  6.2   PH, Arterial 7.350 - 7.450 7.545  7.555  7.135  - -  PCO2 arterial 32.0 - 48.0 mmHg 38.5  34.3  112  - -  Bicarbonate 20.0 - 28.0 mmol/L 33.4  30.5  36.1  - -  TCO2 22 - 32 mmol/L - - 37  - -  O2 Saturation % 89.2  93.6  99.2  - -    Capillary Blood Glucose: Lab Results  Component Value Date   GLUCAP 151 (H) 07/28/2021   GLUCAP 146 (H) 07/28/2021   GLUCAP 121 (H) 07/28/2021   GLUCAP 175 (H) 07/27/2021   GLUCAP 187 (H) 07/27/2021     Pulmonary Assessment Scores:  Pulmonary Assessment Scores     Row Name 05/12/22 1405         ADL UCSD   SOB Score total 25       CAT Score   CAT Score 12       mMRC Score   mMRC Score 2             UCSD: Self-administered rating of dyspnea associated with  activities of daily living (ADLs) 6-point scale (0 = "not at all" to 5 = "maximal or unable to do because of breathlessness")  Scoring Scores range from 0 to 120.  Minimally important difference is 5 units  CAT: CAT can identify the health impairment of COPD patients and is better correlated with disease progression.  CAT has a scoring range of zero to 40. The CAT score is classified into four groups of low (less than 10), medium (10 - 20), high (21-30) and very high (31-40) based on the impact level of disease on health status. A CAT score over 10 suggests significant symptoms.  A worsening CAT score could be explained by an exacerbation, poor medication adherence, poor inhaler technique, or progression of COPD or comorbid conditions.  CAT MCID is 2 points  mMRC: mMRC (Modified Medical Research Council) Dyspnea Scale is used to assess the degree of baseline functional disability in patients of respiratory disease due to dyspnea. No minimal important difference is established. A decrease in score of 1 point or greater is considered a positive change.   Pulmonary Function Assessment:  Pulmonary Function Assessment - 05/12/22 1525       Breath   Bilateral Breath Sounds Decreased    Shortness of Breath Yes             Exercise Target Goals: Exercise Program Goal: Individual exercise prescription set using results from initial 6 min walk test and THRR while considering  patient's activity barriers and safety.   Exercise Prescription Goal: Initial exercise prescription builds to 30-45 minutes a day of aerobic activity, 2-3 days per week.  Home exercise guidelines will be given to patient during program as part of exercise prescription that the participant will acknowledge.  Activity Barriers & Risk Stratification:  Activity Barriers & Cardiac Risk Stratification - 05/12/22 1353       Activity Barriers & Cardiac Risk Stratification   Activity Barriers Deconditioning;Muscular  Weakness;Shortness of Breath;Balance Concerns;Back Problems;Assistive Device;History of Falls    Cardiac Risk Stratification Moderate             6 Minute Walk:  6 Minute Walk     Row Name 05/12/22 1515         6 Minute Walk   Phase Initial     Distance 710 feet     Walk Time 6 minutes     #  of Rest Breaks 0     MPH 1.34     METS 1.8     RPE 13     Perceived Dyspnea  1     VO2 Peak 6.29     Symptoms No     Resting HR 74 bpm     Resting BP 124/66     Resting Oxygen Saturation  94 %     Exercise Oxygen Saturation  during 6 min walk 95 %     Max Ex. HR 102 bpm     Max Ex. BP 140/84     2 Minute Post BP 140/84       Interval HR   1 Minute HR 96     2 Minute HR 99     3 Minute HR 99     4 Minute HR 99     5 Minute HR 99     6 Minute HR 102     2 Minute Post HR 85     Interval Heart Rate? Yes       Interval Oxygen   Interval Oxygen? Yes     Baseline Oxygen Saturation % 94 %     1 Minute Oxygen Saturation % 96 %     1 Minute Liters of Oxygen 0 L     2 Minute Oxygen Saturation % 96 %     2 Minute Liters of Oxygen 0 L     3 Minute Oxygen Saturation % 95 %     3 Minute Liters of Oxygen 0 L     4 Minute Oxygen Saturation % 95 %     4 Minute Liters of Oxygen 0 L     5 Minute Oxygen Saturation % 95 %     5 Minute Liters of Oxygen 0 L     6 Minute Oxygen Saturation % 95 %     6 Minute Liters of Oxygen 0 L     2 Minute Post Oxygen Saturation % 97 %     2 Minute Post Liters of Oxygen 0 L              Oxygen Initial Assessment:  Oxygen Initial Assessment - 05/12/22 1351       Home Oxygen   Home Oxygen Device None    Sleep Oxygen Prescription None    Home Exercise Oxygen Prescription None    Home Resting Oxygen Prescription None    Compliance with Home Oxygen Use Yes      Initial 6 min Walk   Oxygen Used None      Program Oxygen Prescription   Program Oxygen Prescription None             Oxygen Re-Evaluation:   Oxygen Discharge (Final  Oxygen Re-Evaluation):   Initial Exercise Prescription:  Initial Exercise Prescription - 05/12/22 1500       Date of Initial Exercise RX and Referring Provider   Date 05/12/22    Referring Provider [name]    Expected Discharge Date 07/20/22      NuStep   Level 1    SPM 60    Minutes 30    METs 1.5      Prescription Details   Frequency (times per week) 2    Duration Progress to 30 minutes of continuous aerobic without signs/symptoms of physical distress      Intensity   THRR 40-80% of Max Heartrate 56-112    Ratings of Perceived Exertion 11-13  Perceived Dyspnea 0-4      Progression   Progression Continue progressive overload as per policy without signs/symptoms or physical distress.      Resistance Training   Training Prescription Yes    Weight red bands    Reps 10-15             Perform Capillary Blood Glucose checks as needed.  Exercise Prescription Changes:   Exercise Comments:   Exercise Goals and Review:   Exercise Goals     Row Name 05/12/22 1526             Exercise Goals   Increase Physical Activity Yes       Intervention Provide advice, education, support and counseling about physical activity/exercise needs.;Develop an individualized exercise prescription for aerobic and resistive training based on initial evaluation findings, risk stratification, comorbidities and participant's personal goals.       Expected Outcomes Short Term: Attend rehab on a regular basis to increase amount of physical activity.;Long Term: Add in home exercise to make exercise part of routine and to increase amount of physical activity.;Long Term: Exercising regularly at least 3-5 days a week.       Increase Strength and Stamina Yes       Intervention Provide advice, education, support and counseling about physical activity/exercise needs.;Develop an individualized exercise prescription for aerobic and resistive training based on initial evaluation findings, risk  stratification, comorbidities and participant's personal goals.       Expected Outcomes Short Term: Increase workloads from initial exercise prescription for resistance, speed, and METs.;Short Term: Perform resistance training exercises routinely during rehab and add in resistance training at home;Long Term: Improve cardiorespiratory fitness, muscular endurance and strength as measured by increased METs and functional capacity (6MWT)       Able to understand and use rate of perceived exertion (RPE) scale Yes       Intervention Provide education and explanation on how to use RPE scale       Expected Outcomes Short Term: Able to use RPE daily in rehab to express subjective intensity level;Long Term:  Able to use RPE to guide intensity level when exercising independently       Able to understand and use Dyspnea scale Yes       Intervention Provide education and explanation on how to use Dyspnea scale       Expected Outcomes Short Term: Able to use Dyspnea scale daily in rehab to express subjective sense of shortness of breath during exertion;Long Term: Able to use Dyspnea scale to guide intensity level when exercising independently       Knowledge and understanding of Target Heart Rate Range (THRR) Yes       Intervention Provide education and explanation of THRR including how the numbers were predicted and where they are located for reference       Expected Outcomes Short Term: Able to state/look up THRR;Long Term: Able to use THRR to govern intensity when exercising independently;Short Term: Able to use daily as guideline for intensity in rehab       Understanding of Exercise Prescription Yes       Intervention Provide education, explanation, and written materials on patient's individual exercise prescription       Expected Outcomes Short Term: Able to explain program exercise prescription;Long Term: Able to explain home exercise prescription to exercise independently                Exercise Goals  Re-Evaluation :   Discharge  Exercise Prescription (Final Exercise Prescription Changes):   Nutrition:  Target Goals: Understanding of nutrition guidelines, daily intake of sodium '1500mg'$ , cholesterol '200mg'$ , calories 30% from fat and 7% or less from saturated fats, daily to have 5 or more servings of fruits and vegetables.  Biometrics:  Pre Biometrics - 05/12/22 1301       Pre Biometrics   Grip Strength 20 kg              Nutrition Therapy Plan and Nutrition Goals:   Nutrition Assessments:  MEDIFICTS Score Key: ?70 Need to make dietary changes  40-70 Heart Healthy Diet ? 40 Therapeutic Level Cholesterol Diet   Picture Your Plate Scores: <61 Unhealthy dietary pattern with much room for improvement. 41-50 Dietary pattern unlikely to meet recommendations for good health and room for improvement. 51-60 More healthful dietary pattern, with some room for improvement.  >60 Healthy dietary pattern, although there may be some specific behaviors that could be improved.    Nutrition Goals Re-Evaluation:   Nutrition Goals Discharge (Final Nutrition Goals Re-Evaluation):   Psychosocial: Target Goals: Acknowledge presence or absence of significant depression and/or stress, maximize coping skills, provide positive support system. Participant is able to verbalize types and ability to use techniques and skills needed for reducing stress and depression.  Initial Review & Psychosocial Screening:  Initial Psych Review & Screening - 05/12/22 [place]? Yes    Comments Brother Mikki Santee, niece Apolonio Schneiders      Barriers   Psychosocial barriers to participate in program There are no identifiable barriers or psychosocial needs.      Screening Interventions   Interventions Encouraged to exercise             Quality of Life Scores:  Scores of 19 and below usually indicate a poorer quality of life in these areas.  A difference of  2-3 points is  a clinically meaningful difference.  A difference of 2-3 points in the total score of the Quality of Life Index has been associated with significant improvement in overall quality of life, self-image, physical symptoms, and general health in studies assessing change in quality of life.  PHQ-9: Review Flowsheet       05/12/2022  Depression screen PHQ 2/9  Decreased Interest 2  Down, Depressed, Hopeless 0  PHQ - 2 Score 2  Altered sleeping 0  Tired, decreased energy 0  Change in appetite 0  Feeling bad or failure about yourself  0  Trouble concentrating 0  Moving slowly or fidgety/restless 0  Suicidal thoughts 0  PHQ-9 Score 2  Difficult doing work/chores Not difficult at all   Interpretation of Total Score  Total Score Depression Severity:  1-4 = Minimal depression, 5-9 = Mild depression, 10-14 = Moderate depression, 15-19 = Moderately severe depression, 20-27 = Severe depression   Psychosocial Evaluation and Intervention:  Psychosocial Evaluation - 05/12/22 1344       Psychosocial Evaluation & Interventions   Interventions Encouraged to exercise with the program and follow exercise prescription    Comments no concerns    Continue Psychosocial Services  No Follow up required             Psychosocial Re-Evaluation:   Psychosocial Discharge (Final Psychosocial Re-Evaluation):   Education: Education Goals: Education classes will be provided on a weekly basis, covering required topics. Participant will state understanding/return demonstration of topics presented.  Learning Barriers/Preferences:  Learning Barriers/Preferences - 05/12/22 1345  Learning Barriers/Preferences   Learning Barriers Production manager             Education Topics: Risk Factor Reduction:  -Group instruction that is supported by a PowerPoint presentation. Instructor discusses the definition of a risk factor, different risk factors for pulmonary disease, and how  the heart and lungs work together.     Nutrition for Pulmonary Patient:  -Group instruction provided by PowerPoint slides, verbal discussion, and written materials to support subject matter. The instructor gives an explanation and review of healthy diet recommendations, which includes a discussion on weight management, recommendations for fruit and vegetable consumption, as well as protein, fluid, caffeine, fiber, sodium, sugar, and alcohol. Tips for eating when patients are short of breath are discussed.   Pursed Lip Breathing:  -Group instruction that is supported by demonstration and informational handouts. Instructor discusses the benefits of pursed lip and diaphragmatic breathing and detailed demonstration on how to preform both.     Oxygen Safety:  -Group instruction provided by PowerPoint, verbal discussion, and written material to support subject matter. There is an overview of "What is Oxygen" and "Why do we need it".  Instructor also reviews how to create a safe environment for oxygen use, the importance of using oxygen as prescribed, and the risks of noncompliance. There is a brief discussion on traveling with oxygen and resources the patient may utilize.   Oxygen Equipment:  -Group instruction provided by [facility] Staff utilizing handouts, written materials, and equipment demonstrations.   Signs and Symptoms:  -Group instruction provided by written material and verbal discussion to support subject matter. Warning signs and symptoms of infection, stroke, and heart attack are reviewed and when to call the physician/911 reinforced. Tips for preventing the spread of infection discussed.   Advanced Directives:  -Group instruction provided by verbal instruction and written material to support subject matter. Instructor reviews Advanced Directive laws and proper instruction for filling out document.   Pulmonary Video:  -Group video education that reviews the importance of  medication and oxygen compliance, exercise, good nutrition, pulmonary hygiene, and pursed lip and diaphragmatic breathing for the pulmonary patient.   Exercise for the Pulmonary Patient:  -Group instruction that is supported by a PowerPoint presentation. Instructor discusses benefits of exercise, core components of exercise, frequency, duration, and intensity of an exercise routine, importance of utilizing pulse oximetry during exercise, safety while exercising, and options of places to exercise outside of rehab.     Pulmonary Medications:  -Verbally interactive group education provided by instructor with focus on inhaled medications and proper administration.   Anatomy and Physiology of the Respiratory System and Intimacy:  -Group instruction provided by PowerPoint, verbal discussion, and written material to support subject matter. Instructor reviews respiratory cycle and anatomical components of the respiratory system and their functions. Instructor also reviews differences in obstructive and restrictive respiratory diseases with examples of each. Intimacy, Sex, and Sexuality differences are reviewed with a discussion on how relationships can change when diagnosed with pulmonary disease. Common sexual concerns are reviewed.   MD DAY -A group question and answer session with a medical doctor that allows participants to ask questions that relate to their pulmonary disease state.   OTHER EDUCATION -Group or individual verbal, written, or video instructions that support the educational goals of the pulmonary rehab program.   Holiday Eating Survival Tips:  -Group instruction provided by PowerPoint slides, verbal discussion, and written materials to support subject matter. The instructor gives patients tips,  tricks, and techniques to help them not only survive but enjoy the holidays despite the onslaught of food that accompanies the holidays.   Knowledge Questionnaire Score:  Knowledge  Questionnaire Score - 05/12/22 1520       Knowledge Questionnaire Score   Pre Score 13/18             Core Components/Risk Factors/Patient Goals at Admission:  Personal Goals and Risk Factors at Admission - 05/12/22 1351       Core Components/Risk Factors/Patient Goals on Admission   Improve shortness of breath with ADL's Yes    Intervention Provide education, individualized exercise plan and daily activity instruction to help decrease symptoms of SOB with activities of daily living.    Expected Outcomes Short Term: Improve cardiorespiratory fitness to achieve a reduction of symptoms when performing ADLs;Long Term: Be able to perform more ADLs without symptoms or delay the onset of symptoms             Core Components/Risk Factors/Patient Goals Review:    Core Components/Risk Factors/Patient Goals at Discharge (Final Review):    ITP Comments:   Comments: Dr. Rodman Pickle is Medical Director for Pulmonary Rehab at [facility].

## 2022-05-12 NOTE — Progress Notes (Signed)
Mike Gray 81 y.o. male  Initial Psychosocial Assessment  Pt psychosocial assessment reveals pt lives with their family. Pt is currently retired. Pt hobbies include watching tv. Pt reports his  stress level is low. Areas of stress/anxiety include Health.  Pt does not exhibit signs of depression. Pt shows fair  coping skills with positive outlook . Offered emotional support and reassurance. Monitor and evaluate progress toward psychosocial goal(s).  Goal(s): Improved management of depression Improved coping skills Help patient work toward returning to meaningful activities that improve patient's QOL and are attainable with patient's lung disease  [place], ACSM 05/12/2022 3:33 PM

## 2022-05-12 NOTE — Progress Notes (Addendum)
Mike Gray 81 y.o. male Pulmonary Rehab Orientation Note This patient who was referred to Pulmonary Rehab by Dr. [name] with the diagnosis of COPD Stage IV arrived today in Cardiac and Pulmonary Rehab. He arrived with ambulatory with assistive device and ataxic gait. He does not carry portable oxygen. Color good, skin warm and dry. Patient is oriented to time and place. Patient's medical history, psychosocial health, and medications reviewed. Psychosocial assessment reveals patient lives with family. Mike Gray is currently retired. Patient hobbies include watching tv. Patient reports his stress level is low. Areas of stress/anxiety include health. Patient does not exhibit signs of depression.  PHQ2/9 score 2/0. Mike Gray shows fair  coping skills with positive outlook on life. Offered emotional support and reassurance. Will continue to monitor and evaluate progress. Physical assessment performed by Maurice Small RN. Please see their orientation physical assessment note. Mike Gray reports he  does take medications as prescribed. Patient states he  follows a regular  diet. The patient reports no specific efforts to gain or lose weight.. Patient's weight will be monitored closely. Demonstration and practice of PLB using pulse oximeter. Mike Gray able to return demonstration satisfactorily. Safety and hand hygiene in the exercise area reviewed with patient. Mike Gray voices understanding of the information reviewed. Department expectations discussed with patient and achievable goals were set. The patient shows enthusiasm about attending the program and we look forward to working with Mike Gray. Mike Gray completed a 6 min walk test today and is scheduled to begin exercise on 05/18/22 at 1:15.   [place], CES, ACSM

## 2022-05-12 NOTE — Progress Notes (Signed)
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 Ron is oriented x 3.  Heart rate is normal, breath sounds diminished throughout. Reports productive and non-productive cough. Bowel sounds present.  Pt denies abdominal discomfort, nausea, vomiting or diarrhea. Grip strength weak bilat more so on the right side.. Distal pulses palpable with 2+ pitting edema bilateral ankles  and 1+ swelling to lower extremities mid calf down.  Seems to be a medication discrepancy regarding Furosemide.  It is not listed on his medication sheet with [facility] however it is on the reconcile other medications.  Ron's brother Mikki Santee feels certain his brother is taking it but is unsure especially with the swelling that is note.  Mikki Santee viewed the swelling and thought it was his usual amount. Mikki Santee will check the medication dispenser and call me back. .carl

## 2022-05-15 ENCOUNTER — Telehealth (HOSPITAL_COMMUNITY): Payer: Self-pay | Admitting: *Deleted

## 2022-05-15 NOTE — Telephone Encounter (Signed)
Message left on departmental voice mail by the pt brother, Mikki Santee who is listed on the [facility]. Ron has in his pill box Furosemide 20 mg daily filled on 04/27/22. Unclear if the pt is taking this daily or only when advised.  Called and left message requesting call back. Cherre Huger, BSN Cardiac and Training and development officer

## 2022-05-15 NOTE — Telephone Encounter (Signed)
Missed pt brother return call.  Called him back. Reviewed medications with pt. Assured me that he is taking Furosemide 40 mg daily per "home service" who was coming to the home.  This medication is not listed on his medication list for [facility].  Providers may be unaware that his brother is taking Furosemide 40 mg daily from reading the [employer] reports and recommendations. Last refill completed on 7/8 and Last BMP in February. Advised to hold the Furosemide until it can be clarified whether he should continue to take.  Note 1+ ankle edema bilateral with indention from his socks. Will also route my note to Dr. Cheryln Manly nurse. Cherre Huger, BSN Cardiac and Training and development officer

## 2022-05-18 ENCOUNTER — Encounter (HOSPITAL_COMMUNITY)
Admission: RE | Admit: 2022-05-18 | Discharge: 2022-05-18 | Disposition: A | Discharge status: Home / Self Care | Payer: Medicare Other | Source: Ambulatory Visit | Attending: Internal Medicine | Admitting: Internal Medicine

## 2022-05-18 ENCOUNTER — Telehealth: Payer: Self-pay

## 2022-05-18 DIAGNOSIS — J449 Chronic obstructive pulmonary disease, unspecified: Secondary | ICD-10-CM

## 2022-05-18 NOTE — Progress Notes (Signed)
Daily Session Note  Patient Details  Name: Mike Gray MRN: [id number] Date of Birth: 06/16/1941 Referring Provider:   [name] Pulmonary Rehab Walk Test from 05/12/2022 in [place]  Referring Provider [name]       Encounter Date: 05/18/2022  Check In:  Session Check In - 05/18/22 1430       Check-In   Supervising physician immediately available to respond to emergencies [facility] immediately available    Physician(s) Dr. Posey Pronto    Location [facility]    Staff Present Rosebud Poles, RN, BSN;Lisa Ysidro Evert, Cathleen Fears, MS, ACSM-CEP, Exercise Physiologist    Virtual Visit No    Medication changes reported     No    Fall or balance concerns reported    No    Tobacco Cessation No Change    Warm-up and Cool-down Performed as group-led instruction    Resistance Training Performed Yes    VAD Patient? No    PAD/SET Patient? No      Pain Assessment   Currently in Pain? No/denies    Multiple Pain Sites No             Capillary Blood Glucose: No results found for this or any previous visit (from the past 24 hour(s)).    Social History   Tobacco Use  Smoking Status Former   Packs/day: 1.00   Years: 40.00   Total pack years: 40.00   Types: Cigarettes   Quit date: 06/2021   Years since quitting: 0.9  Smokeless Tobacco Never    Goals Met:  Proper associated with RPD/PD & O2 Sat Exercise tolerated well No report of concerns or symptoms today Strength training completed today  Goals Unmet:  Not Applicable  Comments: Service time is from 1315 to 1455.    Dr. Rodman Pickle is Medical Director for Pulmonary Rehab at [facility].

## 2022-05-18 NOTE — Telephone Encounter (Signed)
Alert remote reviewed. Normal device function.   The device has reached the elective replacement indicator, sent to triage There was one short NSVT arrhythmia detected Next remote in 05/22/2022.  Outreach made to Pt.  Advised he triggered ERI on May 18, 2022.  Pt scheduled to see AT 05/24/22 to discuss gen change.

## 2022-05-19 NOTE — Progress Notes (Unsigned)
Electrophysiology Office Note Date: 05/24/2022  ID:  Mike Gray, DOB 05/23/1941, MRN [id number]  PCP: [name], MD Primary Cardiologist: Will [name], MD Electrophysiologist: Constance Haw, MD   CC: Routine ICD follow-up  Mike Gray is a 81 y.o. male seen today for Will [name], MD for routine electrophysiology followup.  Since last being seen in our clinic the patient reports doing very well overall. Denies any undue SOB or CP.  He does have intermittent edema. Has a HHRN who recommended he take lasix, but he has not started because they wanted to review with [place].   He has never had ICD shocks.   Device History: MDT dual chamber ICD, RA lead is from 2007, RV lead is from 2015 Current device implanted 01/12/2014 He has abandoned RA and RV pacing leads and can not have MRIs  Past Medical History:  Diagnosis Date   Angiodysplasia    Atrial fibrillation (HCC)    CHF (congestive heart failure) (HCC)    COPD (chronic obstructive pulmonary disease) ([place])    GI bleed    Hypertension    Past Surgical History:  Procedure Laterality Date   COLONOSCOPY     PACEMAKER IMPLANT      Current Outpatient Medications  Medication Sig Dispense Refill   albuterol (VENTOLIN HFA) 108 (90 Base) MCG/ACT inhaler Inhale into the lungs.     albuterol (VENTOLIN HFA) 108 (90 Base) MCG/ACT inhaler Inhale 2 puffs into the lungs every 6 (six) hours as needed. 18 g 5   amiodarone (PACERONE) 200 MG tablet Take 0.5 tablets (100 mg total) by mouth daily. 45 tablet 1   apixaban (ELIQUIS) 5 MG TABS tablet Take 1 tablet (5 mg total) by mouth 2 (two) times daily. 60 tablet 5   atorvastatin (LIPITOR) 20 MG tablet Take 20 mg by mouth daily.     divalproex (DEPAKOTE ER) 500 MG 24 hr tablet Take 1,000 mg by mouth daily.      finasteride (PROSCAR) 5 MG tablet Take 5 mg by mouth daily.     metoprolol succinate (TOPROL-XL) 50 MG 24 hr tablet Take 1 tablet (50 mg total) by mouth daily.  Take with or immediately following a meal. 90 tablet 3   niacin 500 MG tablet Take 500 mg by mouth at bedtime.     tamsulosin (FLOMAX) 0.4 MG CAPS capsule Take 1 capsule (0.4 mg total) by mouth daily after supper. (Patient taking differently: Take 0.8 mg by mouth daily after supper.) 30 capsule 0   Tiotropium Bromide-Olodaterol (STIOLTO RESPIMAT) 2.5-2.5 MCG/ACT AERS Inhale 2 puffs into the lungs daily. 4 g 5   No current facility-administered medications for this visit.    Allergies:   Patient has no known allergies.   Social History: Social History   Socioeconomic History   Marital status: Divorced    Spouse name: Not on file   Number of children: Not on file   Years of education: Not on file   Highest education level: Not on file  Occupational History   Not on file  Tobacco Use   Smoking status: Former    Packs/day: 1.00    Years: 40.00    Total pack years: 40.00    Types: Cigarettes    Quit date: 06/2021    Years since quitting: 0.9   Smokeless tobacco: Never  Vaping Use   Vaping Use: Never used  Substance and Sexual Activity   Alcohol use: Not Currently   Drug use: Never  Sexual activity: Not on file  Other Topics Concern   Not on file  Social History Narrative   Not on file   Social Determinants of Health   Financial Resource Strain: Not on file  Food Insecurity: Not on file  Transportation Needs: Not on file  Physical Activity: Not on file  Stress: Not on file  Social Connections: Not on file  Intimate Partner Violence: Not on file    Family History: Family History  Problem Relation Age of Onset   Healthy Mother    Breast cancer Father 79   Cancer Other     Review of Systems: All other systems reviewed and are otherwise negative except as noted above.   Physical Exam: Vitals:   05/24/22 1226  BP: 130/72  Pulse: 78  SpO2: 94%  Weight: 163 lb 6.4 oz (74.1 kg)  Height: '5\' 7"'$  (1.702 m)     GEN- The patient is well appearing, alert and  oriented x 3 today.   HEENT: normocephalic, atraumatic; sclera clear, conjunctiva pink; hearing intact; oropharynx clear; neck supple, no JVP Lymph- no cervical lymphadenopathy Lungs- Clear to ausculation bilaterally, normal work of breathing.  No wheezes, rales, rhonchi Heart- Regular rate and rhythm, no murmurs, rubs or gallops, PMI not laterally displaced GI- soft, non-tender, non-distended, bowel sounds present, no hepatosplenomegaly Extremities- no clubbing or cyanosis. No edema; DP/PT/radial pulses 2+ bilaterally MS- no significant deformity or atrophy Skin- warm and dry, no rash or lesion; ICD pocket well healed Psych- euthymic mood, full affect Neuro- strength and sensation are intact  ICD interrogation- reviewed in detail today,  See PACEART report  EKG:  EKG is ordered today. Personal review of EKG ordered today shows V paced at 78 bpm  Recent Labs: 07/19/2021: B Natriuretic Peptide 3,772.3 08/10/2021: Magnesium 2.1 11/24/2021: ALT 15; Hemoglobin 10.9; Platelets 240; TSH 1.200 12/09/2021: BUN 24; Creatinine, Ser 1.35; Potassium 4.3; Sodium 140   Wt Readings from Last 3 Encounters:  05/24/22 163 lb 6.4 oz (74.1 kg)  05/22/22 160 lb 6.4 oz (72.8 kg)  05/12/22 158 lb 15.2 oz (72.1 kg)     Other studies Reviewed: Additional studies/ records that were reviewed today include: Previous EP office notes.   Assessment and Plan:  1.  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 He has been given lasix by [facility]. I recommended he take it x 2 days.  Stable on an appropriate medical regimen Normal ICD function See Pace Art report No changes today His paced QRS is wide but he has multiple abandoned leads. He would not be candidate for device upgrade, even if indicated.   2. Paroxysmal Atrial Fibrillation  EKG today shows V paced rhythm. NSR by device Continue Eliquis for CHA2DS2VASC of at least 3 Continue Amiodarone   Surveillance  labs.  3. HTN Stable on current regimen   Current medicines are reviewed at length with the patient today.    Labs/ tests ordered today include:  Orders Placed This Encounter  Procedures   Comprehensive metabolic panel   CBC   TSH   T4, free   CUP PACEART INCLINIC DEVICE CHECK   EKG 12-Lead   ECHOCARDIOGRAM COMPLETE    Disposition:   Follow up with [name] in as usual post gen change    Signed, Shirley Friar, PA-C  05/24/2022 12:47 PM  [address] [place] [address] [phone] (office) [phone] (fax)

## 2022-05-22 ENCOUNTER — Encounter: Payer: Self-pay | Admitting: Internal Medicine

## 2022-05-22 ENCOUNTER — Ambulatory Visit (INDEPENDENT_AMBULATORY_CARE_PROVIDER_SITE_OTHER): Payer: Medicare Other | Admitting: Internal Medicine

## 2022-05-22 ENCOUNTER — Telehealth: Payer: Self-pay

## 2022-05-22 ENCOUNTER — Ambulatory Visit (INDEPENDENT_AMBULATORY_CARE_PROVIDER_SITE_OTHER): Payer: Medicare Other

## 2022-05-22 VITALS — BP 128/72 | HR 79 | Temp 97.8°F | Ht 67.0 in | Wt 160.4 lb

## 2022-05-22 DIAGNOSIS — J449 Chronic obstructive pulmonary disease, unspecified: Secondary | ICD-10-CM | POA: Diagnosis not present

## 2022-05-22 DIAGNOSIS — I480 Paroxysmal atrial fibrillation: Secondary | ICD-10-CM | POA: Diagnosis not present

## 2022-05-22 DIAGNOSIS — I5020 Unspecified systolic (congestive) heart failure: Secondary | ICD-10-CM | POA: Diagnosis not present

## 2022-05-22 DIAGNOSIS — Z79899 Other long term (current) drug therapy: Secondary | ICD-10-CM

## 2022-05-22 DIAGNOSIS — I422 Other hypertrophic cardiomyopathy: Secondary | ICD-10-CM

## 2022-05-22 LAB — CUP PACEART REMOTE DEVICE CHECK
Battery Remaining Longevity: 1 mo
Battery Voltage: 2.69 V
Brady Statistic AP VP Percent: 99.33 %
Brady Statistic AP VS Percent: 0.11 %
Brady Statistic AS VP Percent: 0.55 %
Brady Statistic AS VS Percent: 0 %
Brady Statistic RA Percent Paced: 99.39 %
Brady Statistic RV Percent Paced: 99.34 %
Date Time Interrogation Session: 20230731043722
HighPow Impedance: 70 Ohm
Implantable Lead Implant Date: 20070328
Implantable Lead Implant Date: 20150323
Implantable Lead Location: 753859
Implantable Lead Location: 753860
Implantable Lead Model: 5076
Implantable Pulse Generator Implant Date: 20150323
Lead Channel Impedance Value: 304 Ohm
Lead Channel Impedance Value: 361 Ohm
Lead Channel Impedance Value: 418 Ohm
Lead Channel Pacing Threshold Amplitude: 0.625 V
Lead Channel Pacing Threshold Amplitude: 0.75 V
Lead Channel Pacing Threshold Pulse Width: 0.4 ms
Lead Channel Pacing Threshold Pulse Width: 0.4 ms
Lead Channel Sensing Intrinsic Amplitude: 1.125 mV
Lead Channel Sensing Intrinsic Amplitude: 1.125 mV
Lead Channel Sensing Intrinsic Amplitude: 9 mV
Lead Channel Sensing Intrinsic Amplitude: 9 mV
Lead Channel Setting Pacing Amplitude: 2 V
Lead Channel Setting Pacing Amplitude: 2.5 V
Lead Channel Setting Pacing Pulse Width: 0.4 ms
Lead Channel Setting Sensing Sensitivity: 0.3 mV

## 2022-05-22 NOTE — Telephone Encounter (Signed)
ICD reached ERI 05/18/22. Patient called made aware. LW

## 2022-05-22 NOTE — Patient Instructions (Signed)
Please schedule follow up scheduled with myself in 3 months.  If my schedule is not open yet, we will contact you with a reminder closer to that time. Please call [phone] if you haven't heard from [place] a month before.   Continue stiolto 2 puffs once a day.   Continue albuterol as needed.   Good luck with pulmonary rehab!   Call me if any issues with your breathing.

## 2022-05-22 NOTE — Progress Notes (Unsigned)
Mike Gray    [id number]    April 06, 1941  Primary Care Physician:[name], [name], MD Date of Appointment: 05/23/2022 Established Patient Visit  Chief complaint:   Chief Complaint  Patient presents with   Follow-up    Not taking Pulmicort, no c/o       HPI: Mike Gray is a 81 y.o. man with severe CIOD FEV1 30% of predicted.   Interval Updates: Here for follow up today with his brother. He has stopped using a walker and has now been using a cane.   He is going to pulmonary rehab now at [facility]- first day was last Thursday. He is feeling good about that.   No interval hospitalizations or ED visits.   Regarding inhalers still taking - stiolto. Feels this is helping.   Taking albuterol 2 puffs twice a day on average.   I have reviewed the patient's family social and past medical history and updated as appropriate.   Past Medical History:  Diagnosis Date   Angiodysplasia    Atrial fibrillation (HCC)    CHF (congestive heart failure) (HCC)    COPD (chronic obstructive pulmonary disease) (HCC)    GI bleed    Hypertension     Past Surgical History:  Procedure Laterality Date   COLONOSCOPY     PACEMAKER IMPLANT      Family History  Problem Relation Age of Onset   Healthy Mother    Breast cancer Father 76   Cancer Other     Social History   Occupational History   Not on file  Tobacco Use   Smoking status: Former    Packs/day: 1.00    Years: 40.00    Total pack years: 40.00    Types: Cigarettes    Quit date: 06/2021    Years since quitting: 0.9   Smokeless tobacco: Never  Vaping Use   Vaping Use: Never used  Substance and Sexual Activity   Alcohol use: Not Currently   Drug use: Never   Sexual activity: Not on file     Physical Exam: Blood pressure 128/72, pulse 79, temperature 97.8 F (36.6 C), temperature source Oral, height '5\' 7"'$  (1.702 m), weight 160 lb 6.4 oz (72.8 kg), SpO2 96 %.  Gen:      No acute distress, elderly, no distress,  room air Lungs:    diminished, no wheezes or crackles CV:         RRR no mrg, no edema   Data Reviewed: Imaging:   PFTs:     Latest Ref Rng & Units 02/14/2022    1:41 PM  PFT Results  FVC-Pre L 2.14   FVC-Predicted Pre % 50   FVC-Post L 2.40   FVC-Predicted Post % 57   Pre FEV1/FVC % % 43   Post FEV1/FCV % % 45   FEV1-Pre L 0.91   FEV1-Predicted Pre % 30   FEV1-Post L 1.08   DLCO uncorrected ml/min/mmHg 10.57   DLCO UNC% % 42   DLCO corrected ml/min/mmHg 10.57   DLCO COR %Predicted % 42   DLVA Predicted % 60    I have personally reviewed the patient's PFTs and severe airflow limitation FEV 1 30% of predicted  Labs: Lab Results  Component Value Date   WBC 5.2 11/24/2021   HGB 10.9 (L) 11/24/2021   HCT 32.7 (L) 11/24/2021   MCV 90 11/24/2021   PLT 240 11/24/2021   Lab Results  Component Value Date   NA  140 12/09/2021   K 4.3 12/09/2021   CL 101 12/09/2021   CO2 25 12/09/2021    Immunization status: Immunization History  Administered Date(s) Administered   Fluad Quad(high Dose 65+) 06/27/2019   PFIZER(Purple Top)SARS-COV-2 Vaccination 11/29/2019, 12/24/2019   Pneumococcal Conjugate-13 06/27/2019    External Records Personally Reviewed:   Assessment:  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  Plan/Recommendations: continue stiolto.  continue prn albuterol MDI as needed.  Continue pulmonary rehab.  Appears euvolemic on exam.  Heart Rate controlled  MOST form previously completed - he is DNR and DNI.   Return to Care: Return in about 3 months (around 08/22/2022).   Lenice Llamas, MD Pulmonary and [place]

## 2022-05-23 ENCOUNTER — Encounter (HOSPITAL_COMMUNITY)
Admission: RE | Admit: 2022-05-23 | Discharge: 2022-05-23 | Disposition: A | Discharge status: Home / Self Care | Payer: Medicare Other | Source: Ambulatory Visit | Attending: Internal Medicine | Admitting: Internal Medicine

## 2022-05-23 DIAGNOSIS — J449 Chronic obstructive pulmonary disease, unspecified: Secondary | ICD-10-CM | POA: Diagnosis present

## 2022-05-23 NOTE — Progress Notes (Signed)
Daily Session Note  Patient Details  Name: Mike Gray MRN: [id number] Date of Birth: Nov 05, 1940 Referring Provider:   [name] Pulmonary Rehab Walk Test from 05/12/2022 in [place]  Referring Provider [name]       Encounter Date: 05/23/2022  Check In:  Session Check In - 05/23/22 1437       Check-In   Supervising physician immediately available to respond to emergencies [facility] immediately available    Physician(s) Dr. Posey Pronto    [facility]    Staff Present Rosebud Poles, RN, BSN;Lisa Ysidro Evert, RN;Other;Kaylee Rosana Hoes, MS, ACSM-CEP, Exercise Physiologist    Virtual Visit No    Medication changes reported     No    Fall or balance concerns reported    No    Tobacco Cessation No Change    Warm-up and Cool-down Performed as group-led instruction    Resistance Training Performed Yes    VAD Patient? No    PAD/SET Patient? No      Pain Assessment   Currently in Pain? No/denies    Multiple Pain Sites No             Capillary Blood Glucose: No results found for this or any previous visit (from the past 24 hour(s)).    Social History   Tobacco Use  Smoking Status Former   Packs/day: 1.00   Years: 40.00   Total pack years: 40.00   Types: Cigarettes   Quit date: 06/2021   Years since quitting: 0.9  Smokeless Tobacco Never    Goals Met:  Exercise tolerated well No report of concerns or symptoms today Strength training completed today  Goals Unmet:  Not Applicable  Comments: Service time is from 1320 to 1442.    Dr. Rodman Pickle is Medical Director for Pulmonary Rehab at [facility].

## 2022-05-24 ENCOUNTER — Ambulatory Visit (INDEPENDENT_AMBULATORY_CARE_PROVIDER_SITE_OTHER): Payer: Medicare Other | Admitting: Student

## 2022-05-24 ENCOUNTER — Encounter: Payer: Self-pay | Admitting: Student

## 2022-05-24 VITALS — BP 130/72 | HR 78 | Ht 67.0 in | Wt 163.4 lb

## 2022-05-24 DIAGNOSIS — Z95810 Presence of automatic (implantable) cardiac defibrillator: Secondary | ICD-10-CM | POA: Diagnosis not present

## 2022-05-24 DIAGNOSIS — I480 Paroxysmal atrial fibrillation: Secondary | ICD-10-CM

## 2022-05-24 DIAGNOSIS — I5042 Chronic combined systolic (congestive) and diastolic (congestive) heart failure: Secondary | ICD-10-CM | POA: Diagnosis not present

## 2022-05-24 LAB — CUP PACEART INCLINIC DEVICE CHECK
Battery Remaining Longevity: 1 mo
Battery Voltage: 2.69 V
Brady Statistic AP VP Percent: 98.37 %
Brady Statistic AP VS Percent: 0.2 %
Brady Statistic AS VP Percent: 1.41 %
Brady Statistic AS VS Percent: 0.01 %
Brady Statistic RA Percent Paced: 98.41 %
Brady Statistic RV Percent Paced: 99.09 %
Date Time Interrogation Session: 20230802124321
HighPow Impedance: 70 Ohm
Implantable Lead Implant Date: 20070328
Implantable Lead Implant Date: 20150323
Implantable Lead Location: 753859
Implantable Lead Location: 753860
Implantable Lead Model: 5076
Implantable Pulse Generator Implant Date: 20150323
Lead Channel Impedance Value: 304 Ohm
Lead Channel Impedance Value: 399 Ohm
Lead Channel Impedance Value: 475 Ohm
Lead Channel Pacing Threshold Amplitude: 0.625 V
Lead Channel Pacing Threshold Amplitude: 0.75 V
Lead Channel Pacing Threshold Pulse Width: 0.4 ms
Lead Channel Pacing Threshold Pulse Width: 0.4 ms
Lead Channel Sensing Intrinsic Amplitude: 1.125 mV
Lead Channel Sensing Intrinsic Amplitude: 1.625 mV
Lead Channel Sensing Intrinsic Amplitude: 5.5 mV
Lead Channel Sensing Intrinsic Amplitude: 9 mV
Lead Channel Setting Pacing Amplitude: 2 V
Lead Channel Setting Pacing Amplitude: 2.5 V
Lead Channel Setting Pacing Pulse Width: 0.4 ms
Lead Channel Setting Sensing Sensitivity: 0.3 mV

## 2022-05-24 NOTE — Patient Instructions (Signed)
Medication Instructions:  Your physician recommends that you continue on your current medications as directed. Please refer to the Current Medication list given to you today.  *If you need a refill on your cardiac medications before your next appointment, please call your pharmacy*   Lab Work: TODAY: CMET, TSH, CBC, FreeT4  If you have labs (blood work) drawn today and your tests are completely normal, you will receive your results only by: [facility] (if you have MyChart) OR A paper copy in the mail If you have any lab test that is abnormal or we need to change your treatment, we will call you to review the results.   Testing/Procedures: Your physician has requested that you have an echocardiogram. Echocardiography is a painless test that uses sound waves to create images of your heart. It provides your doctor with information about the size and shape of your heart and how well your heart's chambers and valves are working. This procedure takes approximately one hour. There are no restrictions for this procedure.   Follow-Up: At [facility], you and your health needs are our priority.  As part of our continuing mission to provide you with exceptional heart care, we have created designated Provider Care Teams.  These Care Teams include your primary Cardiologist (physician) and Advanced Practice Providers (APPs -  Physician Assistants and Nurse Practitioners) who all work together to provide you with the care you need, when you need it.  Your next appointment:   We will call you to schedule

## 2022-05-25 ENCOUNTER — Encounter (HOSPITAL_COMMUNITY)
Admission: RE | Admit: 2022-05-25 | Discharge: 2022-05-25 | Disposition: A | Discharge status: Home / Self Care | Payer: Medicare Other | Source: Ambulatory Visit | Attending: Internal Medicine | Admitting: Internal Medicine

## 2022-05-25 DIAGNOSIS — J449 Chronic obstructive pulmonary disease, unspecified: Secondary | ICD-10-CM

## 2022-05-25 LAB — CUP PACEART REMOTE DEVICE CHECK
Battery Remaining Longevity: 1 mo
Battery Voltage: 2.69 V
Brady Statistic AP VP Percent: 99.57 %
Brady Statistic AP VS Percent: 0.15 %
Brady Statistic AS VP Percent: 0.27 %
Brady Statistic AS VS Percent: 0 %
Brady Statistic RA Percent Paced: 99.69 %
Brady Statistic RV Percent Paced: 99.36 %
Date Time Interrogation Session: 20230803021505
HighPow Impedance: 67 Ohm
Implantable Lead Implant Date: 20070328
Implantable Lead Implant Date: 20150323
Implantable Lead Location: 753859
Implantable Lead Location: 753860
Implantable Lead Model: 5076
Implantable Pulse Generator Implant Date: 20150323
Lead Channel Impedance Value: 304 Ohm
Lead Channel Impedance Value: 361 Ohm
Lead Channel Impedance Value: 456 Ohm
Lead Channel Pacing Threshold Amplitude: 0.625 V
Lead Channel Pacing Threshold Amplitude: 0.75 V
Lead Channel Pacing Threshold Pulse Width: 0.4 ms
Lead Channel Pacing Threshold Pulse Width: 0.4 ms
Lead Channel Sensing Intrinsic Amplitude: 1.125 mV
Lead Channel Sensing Intrinsic Amplitude: 1.625 mV
Lead Channel Sensing Intrinsic Amplitude: 5.5 mV
Lead Channel Sensing Intrinsic Amplitude: 9 mV
Lead Channel Setting Pacing Amplitude: 2 V
Lead Channel Setting Pacing Amplitude: 2.5 V
Lead Channel Setting Pacing Pulse Width: 0.4 ms
Lead Channel Setting Sensing Sensitivity: 0.3 mV

## 2022-05-25 LAB — COMPREHENSIVE METABOLIC PANEL
ALT: 10 IU/L (ref 0–44)
AST: 22 IU/L (ref 0–40)
Albumin/Globulin Ratio: 1.4 (ref 1.2–2.2)
Albumin: 4.2 g/dL (ref 3.8–4.8)
Alkaline Phosphatase: 64 IU/L (ref 44–121)
BUN/Creatinine Ratio: 21 (ref 10–24)
BUN: 32 mg/dL — ABNORMAL HIGH (ref 8–27)
Bilirubin Total: 0.3 mg/dL (ref 0.0–1.2)
CO2: 24 mmol/L (ref 20–29)
Calcium: 10.3 mg/dL — ABNORMAL HIGH (ref 8.6–10.2)
Chloride: 102 mmol/L (ref 96–106)
Creatinine, Ser: 1.54 mg/dL — ABNORMAL HIGH (ref 0.76–1.27)
Globulin, Total: 2.9 g/dL (ref 1.5–4.5)
Glucose: 92 mg/dL (ref 70–99)
Potassium: 5.3 mmol/L — ABNORMAL HIGH (ref 3.5–5.2)
Sodium: 139 mmol/L (ref 134–144)
Total Protein: 7.1 g/dL (ref 6.0–8.5)
eGFR: 45 mL/min/{1.73_m2} — ABNORMAL LOW (ref >59)

## 2022-05-25 LAB — CBC
Hematocrit: 39.5 % (ref 37.5–51.0)
Hemoglobin: 13.1 g/dL (ref 13.0–17.7)
MCH: 28.2 pg (ref 26.6–33.0)
MCHC: 33.2 g/dL (ref 31.5–35.7)
MCV: 85 fL (ref 79–97)
Platelets: 178 10*3/uL (ref 150–450)
RBC: 4.64 x10E6/uL (ref 4.14–5.80)
RDW: 14.4 % (ref 11.6–15.4)
WBC: 6 10*3/uL (ref 3.4–10.8)

## 2022-05-25 LAB — TSH: TSH: 1.93 u[IU]/mL (ref 0.450–4.500)

## 2022-05-25 LAB — T4, FREE: Free T4: 1.25 ng/dL (ref 0.82–1.77)

## 2022-05-25 NOTE — Progress Notes (Signed)
Daily Session Note  Patient Details  Name: Mike Gray MRN: [id number] Date of Birth: 03/08/1941 Referring Provider:   April Gray Pulmonary Rehab Walk Test from 05/12/2022 in [place]  Referring Provider Mike Gray       Encounter Date: 05/25/2022  Check In:  Session Check In - 05/25/22 1424       Check-In   Supervising physician immediately available to respond to emergencies [facility] immediately available    Physician(s) Dr. [place]    [facility]    Staff Present Mike Poles, RN, BSN;Mike Gray BS, ACSM-CEP, Exercise Physiologist;Mike Fross Rosana Hoes, MS, ACSM-CEP, Exercise Physiologist;Mike Ysidro Evert, RN    Virtual Visit No    Medication changes reported     No    Fall or balance concerns reported    No    Tobacco Cessation No Change    Warm-up and Cool-down Performed as group-led instruction    Resistance Training Performed Yes    VAD Patient? No    PAD/SET Patient? No      Pain Assessment   Currently in Pain? No/denies    Multiple Pain Sites No             Capillary Blood Glucose: No results found for this or any previous visit (from the past 24 hour(s)).    Social History   Tobacco Use  Smoking Status Former   Packs/day: 1.00   Years: 40.00   Total pack years: 40.00   Types: Cigarettes   Quit date: 06/2021   Years since quitting: 0.9  Smokeless Tobacco Never    Goals Met:  Proper associated with RPD/PD & O2 Sat Exercise tolerated well No report of concerns or symptoms today Strength training completed today  Goals Unmet:  Not Applicable  Comments: Service time is from 1315 to 1445.    Dr. Rodman Gray is Medical Director for Pulmonary Rehab at [facility].

## 2022-05-26 ENCOUNTER — Telehealth (HOSPITAL_COMMUNITY): Payer: Self-pay | Admitting: Family Medicine

## 2022-05-26 ENCOUNTER — Other Ambulatory Visit: Payer: Self-pay

## 2022-05-26 DIAGNOSIS — I5042 Chronic combined systolic (congestive) and diastolic (congestive) heart failure: Secondary | ICD-10-CM

## 2022-05-30 ENCOUNTER — Encounter (HOSPITAL_COMMUNITY)
Admission: RE | Admit: 2022-05-30 | Discharge: 2022-05-30 | Disposition: A | Discharge status: Home / Self Care | Payer: Medicare Other | Source: Ambulatory Visit | Attending: Internal Medicine | Admitting: Internal Medicine

## 2022-05-30 VITALS — Wt 162.0 lb

## 2022-05-30 DIAGNOSIS — J449 Chronic obstructive pulmonary disease, unspecified: Secondary | ICD-10-CM

## 2022-05-30 NOTE — Progress Notes (Signed)
Daily Session Note  Patient Details  Name: Mike Gray MRN: [id number] Date of Birth: November 16, 1940 Referring Provider:   [name] Pulmonary Rehab Walk Test from 05/12/2022 in [place]  Referring Provider [name]       Encounter Date: 05/30/2022  Check In:  Session Check In - 05/30/22 1532       Check-In   Supervising physician immediately available to respond to emergencies [facility] immediately available    Physician(s) Dr. [place]    [facility]    Staff Present Elmon Else, MS, ACSM-CEP, Exercise Physiologist;Randi Yevonne Pax, ACSM-CEP, Exercise Physiologist;Lisa Ysidro Evert, RN    Virtual Visit No    Medication changes reported     No    Fall or balance concerns reported    No    Tobacco Cessation No Change    Warm-up and Cool-down Performed as group-led instruction    Resistance Training Performed Yes    VAD Patient? No    PAD/SET Patient? No      Pain Assessment   Currently in Pain? No/denies    Multiple Pain Sites No             Capillary Blood Glucose: No results found for this or any previous visit (from the past 24 hour(s)).   Exercise Prescription Changes - 05/30/22 1600       Response to Exercise   Blood Pressure (Admit) 100/66    Blood Pressure (Exercise) 108/60    Blood Pressure (Exit) 110/62    Heart Rate (Admit) 65 bpm    Heart Rate (Exercise) 68 bpm    Heart Rate (Exit) 60 bpm    Oxygen Saturation (Admit) 94 %    Oxygen Saturation (Exercise) 96 %    Oxygen Saturation (Exit) 96 %    Rating of Perceived Exertion (Exercise) 15    Perceived Dyspnea (Exercise) 0    Duration Continue with 30 min of aerobic exercise without signs/symptoms of physical distress.    Intensity THRR unchanged      Progression   Progression Continue to progress workloads to maintain intensity without signs/symptoms of physical distress.      Resistance Training   Training Prescription Yes    Weight  red bands    Reps 10-15    Time 10 Minutes      NuStep   Level 2    SPM 60    Minutes 15    METs 1.1      Arm Ergometer   Level 1    Watts --   25 rpm   Minutes 15             Social History   Tobacco Use  Smoking Status Former   Packs/day: 1.00   Years: 40.00   Total pack years: 40.00   Types: Cigarettes   Quit date: 06/2021   Years since quitting: 0.9  Smokeless Tobacco Never    Goals Met:  Exercise tolerated well No report of concerns or symptoms today Strength training completed today  Goals Unmet:  Not Applicable  Comments: Service time is from 1312 to 1450.    Dr. Rodman Pickle is Medical Director for Pulmonary Rehab at [facility].

## 2022-05-31 ENCOUNTER — Telehealth: Payer: Self-pay

## 2022-05-31 DIAGNOSIS — I5042 Chronic combined systolic (congestive) and diastolic (congestive) heart failure: Secondary | ICD-10-CM

## 2022-05-31 NOTE — Progress Notes (Signed)
Pulmonary Individual Treatment Plan  Patient Details  Name: Mike Gray MRN: [id number] Date of Birth: 03-19-1941 Referring Provider:   [name] Pulmonary Rehab Walk Test from 05/12/2022 in [facility]  Referring Provider [name]       Initial Encounter Date:  Flowsheet Row Pulmonary Rehab Walk Test from 05/12/2022 in [facility]  Date 05/12/22       Visit Diagnosis: Stage 4 very severe COPD by GOLD classification ([place])  Patient's Home Medications on Admission:   Current Outpatient Medications:    albuterol (VENTOLIN HFA) 108 (90 Base) MCG/ACT inhaler, Inhale into the lungs., Disp: , Rfl:    albuterol (VENTOLIN HFA) 108 (90 Base) MCG/ACT inhaler, Inhale 2 puffs into the lungs every 6 (six) hours as needed., Disp: 18 g, Rfl: 5   amiodarone (PACERONE) 200 MG tablet, Take 0.5 tablets (100 mg total) by mouth daily., Disp: 45 tablet, Rfl: 1   apixaban (ELIQUIS) 5 MG TABS tablet, Take 1 tablet (5 mg total) by mouth 2 (two) times daily., Disp: 60 tablet, Rfl: 5   atorvastatin (LIPITOR) 20 MG tablet, Take 20 mg by mouth daily., Disp: , Rfl:    divalproex (DEPAKOTE ER) 500 MG 24 hr tablet, Take 1,000 mg by mouth daily. , Disp: , Rfl:    finasteride (PROSCAR) 5 MG tablet, Take 5 mg by mouth daily., Disp: , Rfl:    metoprolol succinate (TOPROL-XL) 50 MG 24 hr tablet, Take 1 tablet (50 mg total) by mouth daily. Take with or immediately following a meal., Disp: 90 tablet, Rfl: 3   niacin 500 MG tablet, Take 500 mg by mouth at bedtime., Disp: , Rfl:    tamsulosin (FLOMAX) 0.4 MG CAPS capsule, Take 1 capsule (0.4 mg total) by mouth daily after supper. (Patient taking differently: Take 0.8 mg by mouth daily after supper.), Disp: 30 capsule, Rfl: 0   Tiotropium Bromide-Olodaterol (STIOLTO RESPIMAT) 2.5-2.5 MCG/ACT AERS, Inhale 2 puffs into the lungs daily., Disp: 4 g, Rfl: 5  Past Medical History: Past Medical History:  Diagnosis  Date   Angiodysplasia    Atrial fibrillation (HCC)    CHF (congestive heart failure) (HCC)    COPD (chronic obstructive pulmonary disease) (HCC)    GI bleed    Hypertension     Tobacco Use: Social History   Tobacco Use  Smoking Status Former   Packs/day: 1.00   Years: 40.00   Total pack years: 40.00   Types: Cigarettes   Quit date: 06/2021   Years since quitting: 0.9  Smokeless Tobacco Never    Labs: Review Flowsheet  More data exists      Latest Ref Rng & Units 07/06/2020 07/07/2020 07/19/2021 07/29/2021 07/31/2021  Labs for ITP Cardiac and Pulmonary Rehab  Cholestrol 0 - 200 mg/dL - - - 87  -  LDL (calc) 0 - 99 mg/dL - - - 40  -  HDL-C >40 mg/dL - - - 34  -  Trlycerides <150 mg/dL - - - 63  -  Hemoglobin A1c 4.8 - 5.6 % - - - - 6.2   PH, Arterial 7.350 - 7.450 7.545  7.555  7.135  - -  PCO2 arterial 32.0 - 48.0 mmHg 38.5  34.3  112  - -  Bicarbonate 20.0 - 28.0 mmol/L 33.4  30.5  36.1  - -  TCO2 22 - 32 mmol/L - - 37  - -  O2 Saturation % 89.2  93.6  99.2  - -  Capillary Blood Glucose: Lab Results  Component Value Date   GLUCAP 151 (H) 07/28/2021   GLUCAP 146 (H) 07/28/2021   GLUCAP 121 (H) 07/28/2021   GLUCAP 175 (H) 07/27/2021   GLUCAP 187 (H) 07/27/2021     Pulmonary Assessment Scores:  Pulmonary Assessment Scores     Row Name 05/12/22 1405         ADL UCSD   SOB Score total 25       CAT Score   CAT Score 12       mMRC Score   mMRC Score 2             UCSD: Self-administered rating of dyspnea associated with activities of daily living (ADLs) 6-point scale (0 = "not at all" to 5 = "maximal or unable to do because of breathlessness")  Scoring Scores range from 0 to 120.  Minimally important difference is 5 units  CAT: CAT can identify the health impairment of COPD patients and is better correlated with disease progression.  CAT has a scoring range of zero to 40. The CAT score is classified into four groups of low (less than 10), medium  (10 - 20), high (21-30) and very high (31-40) based on the impact level of disease on health status. A CAT score over 10 suggests significant symptoms.  A worsening CAT score could be explained by an exacerbation, poor medication adherence, poor inhaler technique, or progression of COPD or comorbid conditions.  CAT MCID is 2 points  mMRC: mMRC (Modified Medical Research Council) Dyspnea Scale is used to assess the degree of baseline functional disability in patients of respiratory disease due to dyspnea. No minimal important difference is established. A decrease in score of 1 point or greater is considered a positive change.   Pulmonary Function Assessment:  Pulmonary Function Assessment - 05/12/22 1525       Breath   Bilateral Breath Sounds Decreased    Shortness of Breath Yes             Exercise Target Goals: Exercise Program Goal: Individual exercise prescription set using results from initial 6 min walk test and THRR while considering  patient's activity barriers and safety.   Exercise Prescription Goal: Initial exercise prescription builds to 30-45 minutes a day of aerobic activity, 2-3 days per week.  Home exercise guidelines will be given to patient during program as part of exercise prescription that the participant will acknowledge.  Activity Barriers & Risk Stratification:  Activity Barriers & Cardiac Risk Stratification - 05/12/22 1353       Activity Barriers & Cardiac Risk Stratification   Activity Barriers Deconditioning;Muscular Weakness;Shortness of Breath;Balance Concerns;Back Problems;Assistive Device;History of Falls    Cardiac Risk Stratification Moderate             6 Minute Walk:  6 Minute Walk     Row Name 05/12/22 1515         6 Minute Walk   Phase Initial     Distance 710 feet     Walk Time 6 minutes     # of Rest Breaks 0     MPH 1.34     METS 1.8     RPE 13     Perceived Dyspnea  1     VO2 Peak 6.29     Symptoms No     Resting HR  74 bpm     Resting BP 124/66     Resting Oxygen Saturation  94 %  Exercise Oxygen Saturation  during 6 min walk 95 %     Max Ex. HR 102 bpm     Max Ex. BP 140/84     2 Minute Post BP 140/84       Interval HR   1 Minute HR 96     2 Minute HR 99     3 Minute HR 99     4 Minute HR 99     5 Minute HR 99     6 Minute HR 102     2 Minute Post HR 85     Interval Heart Rate? Yes       Interval Oxygen   Interval Oxygen? Yes     Baseline Oxygen Saturation % 94 %     1 Minute Oxygen Saturation % 96 %     1 Minute Liters of Oxygen 0 L     2 Minute Oxygen Saturation % 96 %     2 Minute Liters of Oxygen 0 L     3 Minute Oxygen Saturation % 95 %     3 Minute Liters of Oxygen 0 L     4 Minute Oxygen Saturation % 95 %     4 Minute Liters of Oxygen 0 L     5 Minute Oxygen Saturation % 95 %     5 Minute Liters of Oxygen 0 L     6 Minute Oxygen Saturation % 95 %     6 Minute Liters of Oxygen 0 L     2 Minute Post Oxygen Saturation % 97 %     2 Minute Post Liters of Oxygen 0 L              Oxygen Initial Assessment:  Oxygen Initial Assessment - 05/12/22 1351       Home Oxygen   Home Oxygen Device None    Sleep Oxygen Prescription None    Home Exercise Oxygen Prescription None    Home Resting Oxygen Prescription None    Compliance with Home Oxygen Use Yes      Initial 6 min Walk   Oxygen Used None      Program Oxygen Prescription   Program Oxygen Prescription None      Intervention   Short Term Goals To learn and understand importance of monitoring SPO2 with pulse oximeter and demonstrate accurate use of the pulse oximeter.;To learn and understand importance of maintaining oxygen saturations>88%;To learn and demonstrate proper pursed lip breathing techniques or other breathing techniques. ;To learn and demonstrate proper use of respiratory medications    Long  Term Goals Verbalizes importance of monitoring SPO2 with pulse oximeter and return demonstration;Maintenance of O2  saturations>88%;Exhibits proper breathing techniques, such as pursed lip breathing or other method taught during program session;Compliance with respiratory medication             Oxygen Re-Evaluation:  Oxygen Re-Evaluation     Row Name 05/26/22 0802             Program Oxygen Prescription   Program Oxygen Prescription None         Home Oxygen   Home Oxygen Device None       Sleep Oxygen Prescription None       Home Exercise Oxygen Prescription None       Home Resting Oxygen Prescription None       Compliance with Home Oxygen Use Yes         Goals/Expected Outcomes  Short Term Goals To learn and understand importance of monitoring SPO2 with pulse oximeter and demonstrate accurate use of the pulse oximeter.;To learn and understand importance of maintaining oxygen saturations>88%;To learn and demonstrate proper pursed lip breathing techniques or other breathing techniques. ;To learn and demonstrate proper use of respiratory medications       Long  Term Goals Verbalizes importance of monitoring SPO2 with pulse oximeter and return demonstration;Maintenance of O2 saturations>88%;Exhibits proper breathing techniques, such as pursed lip breathing or other method taught during program session;Compliance with respiratory medication       Goals/Expected Outcomes Compliance and understanding of oxygen saturation monitoring and breathing techniques to decrease shortness of breath.                Oxygen Discharge (Final Oxygen Re-Evaluation):  Oxygen Re-Evaluation - 05/26/22 0802       Program Oxygen Prescription   Program Oxygen Prescription None      Home Oxygen   Home Oxygen Device None    Sleep Oxygen Prescription None    Home Exercise Oxygen Prescription None    Home Resting Oxygen Prescription None    Compliance with Home Oxygen Use Yes      Goals/Expected Outcomes   Short Term Goals To learn and understand importance of monitoring SPO2 with pulse oximeter and  demonstrate accurate use of the pulse oximeter.;To learn and understand importance of maintaining oxygen saturations>88%;To learn and demonstrate proper pursed lip breathing techniques or other breathing techniques. ;To learn and demonstrate proper use of respiratory medications    Long  Term Goals Verbalizes importance of monitoring SPO2 with pulse oximeter and return demonstration;Maintenance of O2 saturations>88%;Exhibits proper breathing techniques, such as pursed lip breathing or other method taught during program session;Compliance with respiratory medication    Goals/Expected Outcomes Compliance and understanding of oxygen saturation monitoring and breathing techniques to decrease shortness of breath.             Initial Exercise Prescription:  Initial Exercise Prescription - 05/12/22 1500       Date of Initial Exercise RX and Referring Provider   Date 05/12/22    Referring Provider [name]    Expected Discharge Date 07/20/22      NuStep   Level 1    SPM 60    Minutes 30    METs 1.5      Prescription Details   Frequency (times per week) 2    Duration Progress to 30 minutes of continuous aerobic without signs/symptoms of physical distress      Intensity   THRR 40-80% of Max Heartrate 56-112    Ratings of Perceived Exertion 11-13    Perceived Dyspnea 0-4      Progression   Progression Continue progressive overload as per policy without signs/symptoms or physical distress.      Resistance Training   Training Prescription Yes    Weight red bands    Reps 10-15             Perform Capillary Blood Glucose checks as needed.  Exercise Prescription Changes:   Exercise Prescription Changes     Row Name 05/30/22 1600             Response to Exercise   Blood Pressure (Admit) 100/66       Blood Pressure (Exercise) 108/60       Blood Pressure (Exit) 110/62       Heart Rate (Admit) 65 bpm       Heart Rate (Exercise) 68 bpm  Heart Rate (Exit) 60 bpm        Oxygen Saturation (Admit) 94 %       Oxygen Saturation (Exercise) 96 %       Oxygen Saturation (Exit) 96 %       Rating of Perceived Exertion (Exercise) 15       Perceived Dyspnea (Exercise) 0       Duration Continue with 30 min of aerobic exercise without signs/symptoms of physical distress.       Intensity THRR unchanged         Progression   Progression Continue to progress workloads to maintain intensity without signs/symptoms of physical distress.         Resistance Training   Training Prescription Yes       Weight red bands       Reps 10-15       Time 10 Minutes         NuStep   Level 2       SPM 60       Minutes 15       METs 1.1         Arm Ergometer   Level 1       Watts --  25 rpm       Minutes 15                Exercise Comments:   Exercise Comments     Row Name 05/18/22 1523           Exercise Comments Pt completed first day of exercise. Wesly exercised for 25 min on the Nustep and 5 min on the arm ergometer. He had a nagging hip pain 2/10 on the Nustep at the end. He was switched to the arm ergometer for the last 5 min. He did not have hip pain on the arm ergometer. He performed the warmup and cooldown standing. Staff placed a chair in front of him for balance during the warmup, but he moved it. Kamori needs multiple verbal cues during the exercise session.                Exercise Goals and Review:   Exercise Goals     Row Name 05/12/22 1526 05/26/22 0755           Exercise Goals   Increase Physical Activity Yes Yes      Intervention Provide advice, education, support and counseling about physical activity/exercise needs.;Develop an individualized exercise prescription for aerobic and resistive training based on initial evaluation findings, risk stratification, comorbidities and participant's personal goals. Provide advice, education, support and counseling about physical activity/exercise needs.;Develop an individualized exercise  prescription for aerobic and resistive training based on initial evaluation findings, risk stratification, comorbidities and participant's personal goals.      Expected Outcomes Short Term: Attend rehab on a regular basis to increase amount of physical activity.;Long Term: Add in home exercise to make exercise part of routine and to increase amount of physical activity.;Long Term: Exercising regularly at least 3-5 days a week. Short Term: Attend rehab on a regular basis to increase amount of physical activity.;Long Term: Add in home exercise to make exercise part of routine and to increase amount of physical activity.;Long Term: Exercising regularly at least 3-5 days a week.      Increase Strength and Stamina Yes Yes      Intervention Provide advice, education, support and counseling about physical activity/exercise needs.;Develop an individualized exercise prescription for aerobic and resistive training  based on initial evaluation findings, risk stratification, comorbidities and participant's personal goals. Provide advice, education, support and counseling about physical activity/exercise needs.;Develop an individualized exercise prescription for aerobic and resistive training based on initial evaluation findings, risk stratification, comorbidities and participant's personal goals.      Expected Outcomes Short Term: Increase workloads from initial exercise prescription for resistance, speed, and METs.;Short Term: Perform resistance training exercises routinely during rehab and add in resistance training at home;Long Term: Improve cardiorespiratory fitness, muscular endurance and strength as measured by increased METs and functional capacity (6MWT) Short Term: Increase workloads from initial exercise prescription for resistance, speed, and METs.;Short Term: Perform resistance training exercises routinely during rehab and add in resistance training at home;Long Term: Improve cardiorespiratory fitness, muscular  endurance and strength as measured by increased METs and functional capacity (6MWT)      Able to understand and use rate of perceived exertion (RPE) scale Yes Yes      Intervention Provide education and explanation on how to use RPE scale Provide education and explanation on how to use RPE scale      Expected Outcomes Short Term: Able to use RPE daily in rehab to express subjective intensity level;Long Term:  Able to use RPE to guide intensity level when exercising independently Short Term: Able to use RPE daily in rehab to express subjective intensity level;Long Term:  Able to use RPE to guide intensity level when exercising independently      Able to understand and use Dyspnea scale Yes Yes      Intervention Provide education and explanation on how to use Dyspnea scale Provide education and explanation on how to use Dyspnea scale      Expected Outcomes Short Term: Able to use Dyspnea scale daily in rehab to express subjective sense of shortness of breath during exertion;Long Term: Able to use Dyspnea scale to guide intensity level when exercising independently Short Term: Able to use Dyspnea scale daily in rehab to express subjective sense of shortness of breath during exertion;Long Term: Able to use Dyspnea scale to guide intensity level when exercising independently      Knowledge and understanding of Target Heart Rate Range (THRR) Yes Yes      Intervention Provide education and explanation of THRR including how the numbers were predicted and where they are located for reference Provide education and explanation of THRR including how the numbers were predicted and where they are located for reference      Expected Outcomes Short Term: Able to state/look up THRR;Long Term: Able to use THRR to govern intensity when exercising independently;Short Term: Able to use daily as guideline for intensity in rehab Short Term: Able to state/look up THRR;Long Term: Able to use THRR to govern intensity when exercising  independently;Short Term: Able to use daily as guideline for intensity in rehab      Understanding of Exercise Prescription Yes Yes      Intervention Provide education, explanation, and written materials on patient's individual exercise prescription Provide education, explanation, and written materials on patient's individual exercise prescription      Expected Outcomes Short Term: Able to explain program exercise prescription;Long Term: Able to explain home exercise prescription to exercise independently Short Term: Able to explain program exercise prescription;Long Term: Able to explain home exercise prescription to exercise independently               Exercise Goals Re-Evaluation :  Exercise Goals Re-Evaluation     Row Name 05/26/22 [phone]  Exercise Goal Re-Evaluation   Exercise Goals Review Increase Physical Activity;Increase Strength and Stamina;Able to understand and use rate of perceived exertion (RPE) scale;Able to understand and use Dyspnea scale;Knowledge and understanding of Target Heart Rate Range (THRR);Understanding of Exercise Prescription       Comments Aldrin has completed 3 exercise sessions. He is using the arm ergometer at level 1 averaging 25 rpm for 15 min and the nustep at level 2 for 15 min, averaging 1.4 METs. He is tolerating seated exercise well but has not started progressing, declining progression in his last session. He performs warm up and cooldown exercises mostly standing.  Will continue to monitor and progress as able.       Expected Outcomes Through exercise at rehab and home, the patient will decrease shortness of breath with daily activities and feel confident in carrying out an exercise regimen at home.                Discharge Exercise Prescription (Final Exercise Prescription Changes):  Exercise Prescription Changes - 05/30/22 1600       Response to Exercise   Blood Pressure (Admit) 100/66    Blood Pressure (Exercise) 108/60     Blood Pressure (Exit) 110/62    Heart Rate (Admit) 65 bpm    Heart Rate (Exercise) 68 bpm    Heart Rate (Exit) 60 bpm    Oxygen Saturation (Admit) 94 %    Oxygen Saturation (Exercise) 96 %    Oxygen Saturation (Exit) 96 %    Rating of Perceived Exertion (Exercise) 15    Perceived Dyspnea (Exercise) 0    Duration Continue with 30 min of aerobic exercise without signs/symptoms of physical distress.    Intensity THRR unchanged      Progression   Progression Continue to progress workloads to maintain intensity without signs/symptoms of physical distress.      Resistance Training   Training Prescription Yes    Weight red bands    Reps 10-15    Time 10 Minutes      NuStep   Level 2    SPM 60    Minutes 15    METs 1.1      Arm Ergometer   Level 1    Watts --   25 rpm   Minutes 15             Nutrition:  Target Goals: Understanding of nutrition guidelines, daily intake of sodium <1541m, cholesterol <2040m calories 30% from fat and 7% or less from saturated fats, daily to have 5 or more servings of fruits and vegetables.  Biometrics:  Pre Biometrics - 05/12/22 1301       Pre Biometrics   Grip Strength 20 kg              Nutrition Therapy Plan and Nutrition Goals:  Nutrition Therapy & Goals - 05/25/22 1527       Nutrition Therapy   Diet Heart Healthy Diet    Drug/Food Interactions Statins/Certain Fruits      Personal Nutrition Goals   Nutrition Goal Patient to choose a variety of fruits, vegetables, whole grains, lean protein/plant protein, lowfat nonfat dairy as part of heart healthy diet.    Personal Goal #2 Patient to limit sodium to <15007mer day    Personal Goal #3 Patient to limit intake of saturated fat, trans fat, refined carbohydrates, and sodium    Comments Patient declined RD intervention.      Intervention Plan   Expected Outcomes  Long Term Goal: Adherence to prescribed nutrition plan.             Nutrition Assessments:  Nutrition  Assessments - 05/25/22 1556       Rate Your Plate Scores   Pre Score 44            MEDIFICTS Score Key: ?70 Need to make dietary changes  40-70 Heart Healthy Diet ? 40 Therapeutic Level Cholesterol Diet  Flowsheet Row PULMONARY REHAB CHRONIC OBSTRUCTIVE PULMONARY DISEASE from 05/25/2022 in [facility]  Picture Your Plate Total Score on Admission 44      Picture Your Plate Scores: <09 Unhealthy dietary pattern with much room for improvement. 41-50 Dietary pattern unlikely to meet recommendations for good health and room for improvement. 51-60 More healthful dietary pattern, with some room for improvement.  >60 Healthy dietary pattern, although there may be some specific behaviors that could be improved.    Nutrition Goals Re-Evaluation:  Nutrition Goals Re-Evaluation     [place] Name 05/25/22 1527             Goals   Current Weight 162 lb 7.7 oz (73.7 kg)       Comment Most recent labs show potassium 5.3, Cr 1.54, GFR 45       Expected Outcome Declined RD intervention.                Nutrition Goals Discharge (Final Nutrition Goals Re-Evaluation):  Nutrition Goals Re-Evaluation - 05/25/22 1527       Goals   Current Weight 162 lb 7.7 oz (73.7 kg)    Comment Most recent labs show potassium 5.3, Cr 1.54, GFR 45    Expected Outcome Declined RD intervention.             Psychosocial: Target Goals: Acknowledge presence or absence of significant depression and/or stress, maximize coping skills, provide positive support system. Participant is able to verbalize types and ability to use techniques and skills needed for reducing stress and depression.  Initial Review & Psychosocial Screening:  Initial Psych Review & Screening - 05/12/22 1343       Initial Review   Current issues with None Identified      Family Dynamics   Good Support System? Yes    Comments Brother Mikki Santee, niece Apolonio Schneiders      Barriers   Psychosocial barriers to  participate in program There are no identifiable barriers or psychosocial needs.      Screening Interventions   Interventions Encouraged to exercise             Quality of Life Scores:  Scores of 19 and below usually indicate a poorer quality of life in these areas.  A difference of  2-3 points is a clinically meaningful difference.  A difference of 2-3 points in the total score of the Quality of Life Index has been associated with significant improvement in overall quality of life, self-image, physical symptoms, and general health in studies assessing change in quality of life.  PHQ-9: Review Flowsheet       05/12/2022  Depression screen PHQ 2/9  Decreased Interest 2  Down, Depressed, Hopeless 0  PHQ - 2 Score 2  Altered sleeping 0  Tired, decreased energy 0  Change in appetite 0  Feeling bad or failure about yourself  0  Trouble concentrating 0  Moving slowly or fidgety/restless 0  Suicidal thoughts 0  PHQ-9 Score 2  Difficult doing work/chores Not difficult at all  Interpretation of Total Score  Total Score Depression Severity:  1-4 = Minimal depression, 5-9 = Mild depression, 10-14 = Moderate depression, 15-19 = Moderately severe depression, 20-27 = Severe depression   Psychosocial Evaluation and Intervention:  Psychosocial Evaluation - 05/12/22 1344       Psychosocial Evaluation & Interventions   Interventions Encouraged to exercise with the program and follow exercise prescription    Comments no concerns    Expected Outcomes For Yandel to participate in Pulmonary Rehab    Continue Psychosocial Services  No Follow up required             Psychosocial Re-Evaluation:  Psychosocial Re-Evaluation     [place] Name 05/25/22 [phone]             Psychosocial Re-Evaluation   Current issues with None Identified       Comments No psychosocial barriers or concerns identified at this time.       Expected Outcomes For Ron to be without psychosocial concerns while  participating in pulmonary rehab.       Interventions Encouraged to attend Pulmonary Rehabilitation for the exercise       Continue Psychosocial Services  No Follow up required                Psychosocial Discharge (Final Psychosocial Re-Evaluation):  Psychosocial Re-Evaluation - 05/25/22 7893       Psychosocial Re-Evaluation   Current issues with None Identified    Comments No psychosocial barriers or concerns identified at this time.    Expected Outcomes For Ron to be without psychosocial concerns while participating in pulmonary rehab.    Interventions Encouraged to attend Pulmonary Rehabilitation for the exercise    Continue Psychosocial Services  No Follow up required             Education: Education Goals: Education classes will be provided on a weekly basis, covering required topics. Participant will state understanding/return demonstration of topics presented.  Learning Barriers/Preferences:  Learning Barriers/Preferences - 05/12/22 1345       Learning Barriers/Preferences   Learning Barriers Hearing    Learning Preferences Audio             Education Topics: Risk Factor Reduction:  -Group instruction that is supported by a PowerPoint presentation. Instructor discusses the definition of a risk factor, different risk factors for pulmonary disease, and how the heart and lungs work together.     Nutrition for Pulmonary Patient:  -Group instruction provided by PowerPoint slides, verbal discussion, and written materials to support subject matter. The instructor gives an explanation and review of healthy diet recommendations, which includes a discussion on weight management, recommendations for fruit and vegetable consumption, as well as protein, fluid, caffeine, fiber, sodium, sugar, and alcohol. Tips for eating when patients are short of breath are discussed.   Pursed Lip Breathing:  -Group instruction that is supported by demonstration and informational  handouts. Instructor discusses the benefits of pursed lip and diaphragmatic breathing and detailed demonstration on how to preform both.     Oxygen Safety:  -Group instruction provided by PowerPoint, verbal discussion, and written material to support subject matter. There is an overview of "What is Oxygen" and "Why do we need it".  Instructor also reviews how to create a safe environment for oxygen use, the importance of using oxygen as prescribed, and the risks of noncompliance. There is a brief discussion on traveling with oxygen and resources the patient may utilize.   Oxygen Equipment:  -Group instruction  provided by [facility] Staff utilizing handouts, written materials, and equipment demonstrations.   Signs and Symptoms:  -Group instruction provided by written material and verbal discussion to support subject matter. Warning signs and symptoms of infection, stroke, and heart attack are reviewed and when to call the physician/911 reinforced. Tips for preventing the spread of infection discussed.   Advanced Directives:  -Group instruction provided by verbal instruction and written material to support subject matter. Instructor reviews Advanced Directive laws and proper instruction for filling out document.   Pulmonary Video:  -Group video education that reviews the importance of medication and oxygen compliance, exercise, good nutrition, pulmonary hygiene, and pursed lip and diaphragmatic breathing for the pulmonary patient.   Exercise for the Pulmonary Patient:  -Group instruction that is supported by a PowerPoint presentation. Instructor discusses benefits of exercise, core components of exercise, frequency, duration, and intensity of an exercise routine, importance of utilizing pulse oximetry during exercise, safety while exercising, and options of places to exercise outside of rehab.   Flowsheet Row PULMONARY REHAB CHRONIC OBSTRUCTIVE PULMONARY DISEASE from 05/25/2022 in [place]  Date 05/25/22  Educator Donnetta Simpers  Instruction Review Code 1- Verbalizes Understanding       Pulmonary Medications:  -Verbally interactive group education provided by instructor with focus on inhaled medications and proper administration.   Anatomy and Physiology of the Respiratory System and Intimacy:  -Group instruction provided by PowerPoint, verbal discussion, and written material to support subject matter. Instructor reviews respiratory cycle and anatomical components of the respiratory system and their functions. Instructor also reviews differences in obstructive and restrictive respiratory diseases with examples of each. Intimacy, Sex, and Sexuality differences are reviewed with a discussion on how relationships can change when diagnosed with pulmonary disease. Common sexual concerns are reviewed.   MD DAY -A group question and answer session with a medical doctor that allows participants to ask questions that relate to their pulmonary disease state.   OTHER EDUCATION -Group or individual verbal, written, or video instructions that support the educational goals of the pulmonary rehab program.   Holiday Eating Survival Tips:  -Group instruction provided by PowerPoint slides, verbal discussion, and written materials to support subject matter. The instructor gives patients tips, tricks, and techniques to help them not only survive but enjoy the holidays despite the onslaught of food that accompanies the holidays.   Knowledge Questionnaire Score:  Knowledge Questionnaire Score - 05/12/22 1520       Knowledge Questionnaire Score   Pre Score 13/18             Core Components/Risk Factors/Patient Goals at Admission:  Personal Goals and Risk Factors at Admission - 05/12/22 1351       Core Components/Risk Factors/Patient Goals on Admission   Improve shortness of breath with ADL's Yes    Intervention Provide education, individualized exercise  plan and daily activity instruction to help decrease symptoms of SOB with activities of daily living.    Expected Outcomes Short Term: Improve cardiorespiratory fitness to achieve a reduction of symptoms when performing ADLs;Long Term: Be able to perform more ADLs without symptoms or delay the onset of symptoms             Core Components/Risk Factors/Patient Goals Review:   Goals and Risk Factor Review     Row Name 05/25/22 0943             Core Components/Risk Factors/Patient Goals Review   Personal Goals Review Develop more efficient breathing  techniques such as purse lipped breathing and diaphragmatic breathing and practicing self-pacing with activity.;Increase knowledge of respiratory medications and ability to use respiratory devices properly.;Improve shortness of breath with ADL's       Review Ron has attended 2 exercise sessions, too early to have met his program goals, will continue to increase workloads as tolerated to increase his strengthand stamina       Expected Outcomes See admission goals.                Core Components/Risk Factors/Patient Goals at Discharge (Final Review):   Goals and Risk Factor Review - 05/25/22 0943       Core Components/Risk Factors/Patient Goals Review   Personal Goals Review Develop more efficient breathing techniques such as purse lipped breathing and diaphragmatic breathing and practicing self-pacing with activity.;Increase knowledge of respiratory medications and ability to use respiratory devices properly.;Improve shortness of breath with ADL's    Review Ron has attended 2 exercise sessions, too early to have met his program goals, will continue to increase workloads as tolerated to increase his strengthand stamina    Expected Outcomes See admission goals.             ITP Comments: Pt is making expected progress toward Pulmonary Rehab goals after completing 4 sessions. Recommend continued exercise, life style modification,  education, and utilization of breathing techniques to increase stamina and strength, while also decreasing shortness of breath with exertion.  Dr. Rodman Pickle is Medical Director for Pulmonary Rehab at [facility].

## 2022-05-31 NOTE — Telephone Encounter (Signed)
Spoke to [employer]. Went over ICD Gen Change instructions, meds/date/time. He understands all instructions.

## 2022-06-01 ENCOUNTER — Telehealth (HOSPITAL_COMMUNITY): Payer: Self-pay | Admitting: Family Medicine

## 2022-06-01 ENCOUNTER — Encounter (HOSPITAL_COMMUNITY): Payer: Medicare Other

## 2022-06-01 ENCOUNTER — Telehealth (HOSPITAL_COMMUNITY): Payer: Self-pay | Admitting: *Deleted

## 2022-06-06 ENCOUNTER — Encounter (HOSPITAL_COMMUNITY)
Admission: RE | Admit: 2022-06-06 | Discharge: 2022-06-06 | Disposition: A | Discharge status: Home / Self Care | Payer: Medicare Other | Source: Ambulatory Visit | Attending: Internal Medicine | Admitting: Internal Medicine

## 2022-06-06 DIAGNOSIS — J449 Chronic obstructive pulmonary disease, unspecified: Secondary | ICD-10-CM

## 2022-06-06 NOTE — Progress Notes (Signed)
Daily Session Note  Patient Details  Name: Mike Gray MRN: [id number] Date of Birth: Mar 20, 1941 Referring Provider:   [name] Pulmonary Rehab Walk Test from 05/12/2022 in [place]  Referring Provider [name]       Encounter Date: 06/06/2022  Check In:  Session Check In - 06/06/22 1501       Check-In   Supervising physician immediately available to respond to emergencies [facility] immediately available    Location [facility]    Staff Present Rodney Langton, Cathleen Fears, MS, ACSM-CEP, Exercise Physiologist;Annedrea Rosezella [place], RN, [facility]    Virtual Visit No    Medication changes reported     No    Fall or balance concerns reported    No    Tobacco Cessation No Change    Warm-up and Cool-down Performed as group-led instruction    Resistance Training Performed Yes    VAD Patient? No    PAD/SET Patient? No      Pain Assessment   Currently in Pain? No/denies    Multiple Pain Sites No             Capillary Blood Glucose: No results found for this or any previous visit (from the past 24 hour(s)).    Social History   Tobacco Use  Smoking Status Former   Packs/day: 1.00   Years: 40.00   Total pack years: 40.00   Types: Cigarettes   Quit date: 06/2021   Years since quitting: 0.9  Smokeless Tobacco Never    Goals Met:  Exercise tolerated well No report of concerns or symptoms today Strength training completed today  Goals Unmet:  Not Applicable  Comments: Service time is from 1322 to 1445.    Dr. Rodman Pickle is Medical Director for Pulmonary Rehab at [facility].

## 2022-06-08 ENCOUNTER — Encounter (HOSPITAL_COMMUNITY)
Admission: RE | Admit: 2022-06-08 | Discharge: 2022-06-08 | Disposition: A | Discharge status: Home / Self Care | Payer: Medicare Other | Source: Ambulatory Visit | Attending: Internal Medicine | Admitting: Internal Medicine

## 2022-06-08 DIAGNOSIS — J449 Chronic obstructive pulmonary disease, unspecified: Secondary | ICD-10-CM | POA: Diagnosis not present

## 2022-06-08 NOTE — Progress Notes (Signed)
Daily Session Note  Patient Details  Name: Mike Gray MRN: [id number] Date of Birth: 06-06-1941 Referring Provider:   [name] Pulmonary Rehab Walk Test from 05/12/2022 in [place]  Referring Provider [name]       Encounter Date: 06/08/2022  Check In:  Session Check In - 06/08/22 1429       Check-In   Supervising physician immediately available to respond to emergencies [facility] immediately available    Physician(s) Dr. Lonny Prude    Location [facility]    Staff Present Rosebud Poles, RN, BSN;Carlette Wilber Oliphant, RN, Quentin Ore, MS, ACSM-CEP, Exercise Physiologist;Lanaiya Lantry Ysidro Evert, RN    Virtual Visit No    Medication changes reported     No    Fall or balance concerns reported    No    Tobacco Cessation No Change    Warm-up and Cool-down Performed as group-led instruction    Resistance Training Performed Yes    VAD Patient? No    PAD/SET Patient? No      Pain Assessment   Currently in Pain? No/denies    Multiple Pain Sites No             Capillary Blood Glucose: No results found for this or any previous visit (from the past 24 hour(s)).    Social History   Tobacco Use  Smoking Status Former   Packs/day: 1.00   Years: 40.00   Total pack years: 40.00   Types: Cigarettes   Quit date: 06/2021   Years since quitting: 0.9  Smokeless Tobacco Never    Goals Met:  Proper associated with RPD/PD & O2 Sat Exercise tolerated well No report of concerns or symptoms today Strength training completed today  Goals Unmet:  Not Applicable  Comments: Service time is from [place] to [place]    Dr. Rodman Pickle is Medical Director for Pulmonary Rehab at [facility].

## 2022-06-09 ENCOUNTER — Other Ambulatory Visit: Payer: Medicare Other

## 2022-06-09 ENCOUNTER — Ambulatory Visit (HOSPITAL_COMMUNITY): Payer: Medicare Other | Attending: Cardiology

## 2022-06-09 DIAGNOSIS — I5042 Chronic combined systolic (congestive) and diastolic (congestive) heart failure: Secondary | ICD-10-CM | POA: Diagnosis present

## 2022-06-10 LAB — BASIC METABOLIC PANEL
BUN/Creatinine Ratio: 30 — ABNORMAL HIGH (ref 10–24)
BUN: 48 mg/dL — ABNORMAL HIGH (ref 8–27)
CO2: 22 mmol/L (ref 20–29)
Calcium: 9.9 mg/dL (ref 8.6–10.2)
Chloride: 106 mmol/L (ref 96–106)
Creatinine, Ser: 1.58 mg/dL — ABNORMAL HIGH (ref 0.76–1.27)
Glucose: 72 mg/dL (ref 70–99)
Potassium: 4.6 mmol/L (ref 3.5–5.2)
Sodium: 142 mmol/L (ref 134–144)
eGFR: 44 mL/min/{1.73_m2} — ABNORMAL LOW (ref >59)

## 2022-06-10 LAB — ECHOCARDIOGRAM COMPLETE
Area-P 1/2: 4.71 cm2
S' Lateral: 4.8 cm

## 2022-06-12 NOTE — Progress Notes (Signed)
No ICM remote transmission received for 06/08/2022 and next ICM transmission scheduled for 06/27/2022.

## 2022-06-13 ENCOUNTER — Encounter (HOSPITAL_COMMUNITY)
Admission: RE | Admit: 2022-06-13 | Discharge: 2022-06-13 | Disposition: A | Discharge status: Home / Self Care | Payer: Medicare Other | Source: Ambulatory Visit | Attending: Internal Medicine | Admitting: Internal Medicine

## 2022-06-13 VITALS — Wt 158.5 lb

## 2022-06-13 DIAGNOSIS — J449 Chronic obstructive pulmonary disease, unspecified: Secondary | ICD-10-CM | POA: Diagnosis not present

## 2022-06-13 NOTE — Progress Notes (Signed)
Daily Session Note  Patient Details  Name: Mike Gray MRN: [id number] Date of Birth: 10/10/1941 Referring Provider:   [name] Pulmonary Rehab Walk Test from 05/12/2022 in [place]  Referring Provider [name]       Encounter Date: 06/13/2022  Check In:  Session Check In - 06/13/22 1513       Check-In   Supervising physician immediately available to respond to emergencies [facility] immediately available    Physician(s) Dr. Lonny Prude    [facility]    Staff Present Rosebud Poles, RN, BSN;Randi Olen Cordial BS, ACSM-CEP, Exercise Physiologist;Mataya Kilduff Clarisa Schools, MS, ACSM-CEP, Exercise Physiologist    Virtual Visit No    Medication changes reported     No    Fall or balance concerns reported    No    Tobacco Cessation No Change    Warm-up and Cool-down Performed as group-led instruction    Resistance Training Performed Yes    VAD Patient? No    PAD/SET Patient? No      Pain Assessment   Currently in Pain? No/denies    Multiple Pain Sites No             Capillary Blood Glucose: No results found for this or any previous visit (from the past 24 hour(s)).   Exercise Prescription Changes - 06/13/22 1500       Response to Exercise   Blood Pressure (Admit) 105/56    Blood Pressure (Exercise) 120/68    Blood Pressure (Exit) 104/68    Heart Rate (Admit) 75 bpm    Heart Rate (Exercise) 70 bpm    Heart Rate (Exit) 61 bpm    Oxygen Saturation (Admit) 96 %    Oxygen Saturation (Exercise) 97 %    Oxygen Saturation (Exit) 98 %    Rating of Perceived Exertion (Exercise) 13    Perceived Dyspnea (Exercise) 0    Duration Continue with 30 min of aerobic exercise without signs/symptoms of physical distress.    Intensity THRR unchanged      Progression   Progression Continue to progress workloads to maintain intensity without signs/symptoms of physical distress.      Resistance Training   Training  Prescription Yes    Weight Red bands    Reps 10-15    Time 10 Minutes      NuStep   Level 3    SPM 60    Minutes 15    METs 1.3      Arm Ergometer   Level 1    Watts 1    Minutes 15             Social History   Tobacco Use  Smoking Status Former   Packs/day: 1.00   Years: 40.00   Total pack years: 40.00   Types: Cigarettes   Quit date: 06/2021   Years since quitting: 0.9  Smokeless Tobacco Never    Goals Met:  Proper associated with RPD/PD & O2 Sat Exercise tolerated well No report of concerns or symptoms today Strength training completed today  Goals Unmet:  Not Applicable  Comments: Service time is from 1306 to [place]    Dr. Rodman Pickle is Medical Director for Pulmonary Rehab at [facility].

## 2022-06-15 ENCOUNTER — Encounter (HOSPITAL_COMMUNITY)
Admission: RE | Admit: 2022-06-15 | Discharge: 2022-06-15 | Disposition: A | Discharge status: Home / Self Care | Payer: Medicare Other | Source: Ambulatory Visit | Attending: Internal Medicine | Admitting: Internal Medicine

## 2022-06-15 DIAGNOSIS — J449 Chronic obstructive pulmonary disease, unspecified: Secondary | ICD-10-CM | POA: Diagnosis not present

## 2022-06-15 NOTE — Progress Notes (Signed)
Home Exercise Prescription I have reviewed a Home Exercise Prescription with Mike Gray.  He is currently not exercising at home. He is uninterested in exercising at home. Encouraged him to think about joining a structured program after PR.The patient stated that their goals were to turn 81 years old in October. We reviewed exercise guidelines, target heart rate during exercise, RPE Scale, weather conditions, endpoints for exercise, warmup and cool down. The patient is encouraged to come to me with any questions. I will continue to follow up with the patient to assist them with progression and safety.    Mike Gray [place], [place], ACSM-CEP 06/15/2022 3:25 PM

## 2022-06-15 NOTE — Progress Notes (Signed)
Daily Session Note  Patient Details  Name: Mike Gray MRN: [id number] Date of Birth: May 17, 1941 Referring Provider:   [name] Pulmonary Rehab Walk Test from 05/12/2022 in [place]  Referring Provider [name]       Encounter Date: 06/15/2022  Check In:  Session Check In - 06/15/22 1516       Check-In   Supervising physician immediately available to respond to emergencies [facility] immediately available    Physician(s) Dr. Florene Glen    [facility]    Staff Present Rosebud Poles, RN, Quentin Ore, MS, ACSM-CEP, Exercise Physiologist;Kaysey Berndt Yevonne Pax, ACSM-CEP, Exercise Physiologist;Carlette Wilber Oliphant, RN, Roque Cash, RN    Virtual Visit No    Medication changes reported     No    Fall or balance concerns reported    No    Tobacco Cessation No Change    Warm-up and Cool-down Performed as group-led instruction    Resistance Training Performed Yes    VAD Patient? No    PAD/SET Patient? No      Pain Assessment   Currently in Pain? No/denies    Multiple Pain Sites No             Capillary Blood Glucose: No results found for this or any previous visit (from the past 24 hour(s)).   Exercise Prescription Changes - 06/15/22 1500       Home Exercise Plan   Plans to continue exercise at Home (comment)    Frequency Add 1 additional day to program exercise sessions.    Initial Home Exercises Provided 06/15/22             Social History   Tobacco Use  Smoking Status Former   Packs/day: 1.00   Years: 40.00   Total pack years: 40.00   Types: Cigarettes   Quit date: 06/2021   Years since quitting: 0.9  Smokeless Tobacco Never    Goals Met:  Independence with exercise equipment Exercise tolerated well No report of concerns or symptoms today Strength training completed today  Goals Unmet:  Not Applicable  Comments: Service time is from 1319 to 1440.    Dr. Rodman Pickle is Medical  Director for Pulmonary Rehab at [facility].

## 2022-06-20 ENCOUNTER — Encounter (HOSPITAL_COMMUNITY)
Admission: RE | Admit: 2022-06-20 | Discharge: 2022-06-20 | Disposition: A | Discharge status: Home / Self Care | Payer: Medicare Other | Source: Ambulatory Visit | Attending: Internal Medicine | Admitting: Internal Medicine

## 2022-06-20 DIAGNOSIS — J449 Chronic obstructive pulmonary disease, unspecified: Secondary | ICD-10-CM | POA: Diagnosis not present

## 2022-06-20 NOTE — Progress Notes (Signed)
Daily Session Note  Patient Details  Name: Mike Gray MRN: [id number] Date of Birth: 1941/03/26 Referring Provider:   [name] Pulmonary Rehab Walk Test from 05/12/2022 in [place]  Referring Provider [name]       Encounter Date: 06/20/2022  Check In:  Session Check In - 06/20/22 1425       Check-In   Supervising physician immediately available to respond to emergencies [facility] immediately available    Physician(s) Dr. Florene Glen    [facility]    Staff Present Rosebud Poles, RN, BSN;Randi Olen Cordial BS, ACSM-CEP, Exercise Physiologist;Kaylee Rosana Hoes, MS, ACSM-CEP, Exercise Physiologist;Lisa Ysidro Evert, RN    Virtual Visit No    Medication changes reported     No    Fall or balance concerns reported    No    Tobacco Cessation No Change    Warm-up and Cool-down Performed as group-led instruction    Resistance Training Performed Yes    VAD Patient? No    PAD/SET Patient? No      Pain Assessment   Currently in Pain? No/denies    Multiple Pain Sites No             Capillary Blood Glucose: No results found for this or any previous visit (from the past 24 hour(s)).    Social History   Tobacco Use  Smoking Status Former   Packs/day: 1.00   Years: 40.00   Total pack years: 40.00   Types: Cigarettes   Quit date: 06/2021   Years since quitting: 0.9  Smokeless Tobacco Never    Goals Met:  Proper associated with RPD/PD & O2 Sat Exercise tolerated well No report of concerns or symptoms today Strength training completed today  Goals Unmet:  Not Applicable  Comments: Service time is from 1315 to 1439.    Dr. Rodman Pickle is Medical Director for Pulmonary Rehab at [facility].

## 2022-06-22 ENCOUNTER — Encounter (HOSPITAL_COMMUNITY)
Admission: RE | Admit: 2022-06-22 | Discharge: 2022-06-22 | Disposition: A | Discharge status: Home / Self Care | Payer: Medicare Other | Source: Ambulatory Visit | Attending: Internal Medicine | Admitting: Internal Medicine

## 2022-06-22 DIAGNOSIS — J449 Chronic obstructive pulmonary disease, unspecified: Secondary | ICD-10-CM

## 2022-06-22 NOTE — Progress Notes (Signed)
Daily Session Note  Patient Details  Name: Mike Gray MRN: [id number] Date of Birth: Oct 20, 1941 Referring Provider:   [name] Pulmonary Rehab Walk Test from 05/12/2022 in [place]  Referring Provider [name]       Encounter Date: 06/22/2022  Check In:  Session Check In - 06/22/22 1422       Check-In   Supervising physician immediately available to respond to emergencies [facility] immediately available    Physician(s) Dr. Verlon Au    Location [facility]    Staff Present Rosebud Poles, RN, BSN;Randi Olen Cordial BS, ACSM-CEP, Exercise Physiologist;Kaylee Rosana Hoes, MS, ACSM-CEP, Exercise Physiologist;Tinita Brooker Ysidro Evert, RN    Virtual Visit No    Medication changes reported     No    Fall or balance concerns reported    No    Tobacco Cessation No Change    Warm-up and Cool-down Performed as group-led instruction    Resistance Training Performed Yes    VAD Patient? No    PAD/SET Patient? No      Pain Assessment   Currently in Pain? No/denies    Multiple Pain Sites No             Capillary Blood Glucose: No results found for this or any previous visit (from the past 24 hour(s)).    Social History   Tobacco Use  Smoking Status Former   Packs/day: 1.00   Years: 40.00   Total pack years: 40.00   Types: Cigarettes   Quit date: 06/2021   Years since quitting: 0.9  Smokeless Tobacco Never    Goals Met:  Proper associated with RPD/PD & O2 Sat Exercise tolerated well No report of concerns or symptoms today Strength training completed today  Goals Unmet:  Not Applicable  Comments: Service time is from 1315 to 1440    Dr. Rodman Pickle is Medical Director for Pulmonary Rehab at [facility]

## 2022-06-24 NOTE — Progress Notes (Signed)
Remote ICD transmission.   

## 2022-06-24 NOTE — Addendum Note (Signed)
Addended by: Douglass Rivers D on: 06/24/2022 11:03 PM   Modules accepted: Level of Service

## 2022-06-27 ENCOUNTER — Ambulatory Visit (INDEPENDENT_AMBULATORY_CARE_PROVIDER_SITE_OTHER): Payer: Medicare Other

## 2022-06-27 ENCOUNTER — Encounter (HOSPITAL_COMMUNITY)
Admission: RE | Admit: 2022-06-27 | Discharge: 2022-06-27 | Disposition: A | Discharge status: Home / Self Care | Payer: Medicare Other | Source: Ambulatory Visit | Attending: Internal Medicine | Admitting: Internal Medicine

## 2022-06-27 VITALS — Wt 159.2 lb

## 2022-06-27 DIAGNOSIS — I5022 Chronic systolic (congestive) heart failure: Secondary | ICD-10-CM

## 2022-06-27 DIAGNOSIS — I5042 Chronic combined systolic (congestive) and diastolic (congestive) heart failure: Secondary | ICD-10-CM | POA: Diagnosis present

## 2022-06-27 DIAGNOSIS — Z95810 Presence of automatic (implantable) cardiac defibrillator: Secondary | ICD-10-CM

## 2022-06-27 DIAGNOSIS — J449 Chronic obstructive pulmonary disease, unspecified: Secondary | ICD-10-CM | POA: Insufficient documentation

## 2022-06-27 NOTE — Progress Notes (Signed)
Daily Session Note  Patient Details  Name: Mike Gray MRN: [id number] Date of Birth: 11-19-1940 Referring Provider:   [name] Pulmonary Rehab Walk Test from 05/12/2022 in [place]  Referring Provider [name]       Encounter Date: 06/27/2022  Check In:  Session Check In - 06/27/22 1457       Check-In   Supervising physician immediately available to respond to emergencies [facility] immediately available    Physician(s) Dr. Verlon Au    Location [facility]    Staff Present Rosebud Poles, RN, Quentin Ore, MS, ACSM-CEP, Exercise Physiologist;Randi Yevonne Pax, ACSM-CEP, Exercise Physiologist;Lisa Ysidro Evert, RN    Virtual Visit No    Medication changes reported     No    Fall or balance concerns reported    No    Tobacco Cessation No Change    Warm-up and Cool-down Performed as group-led instruction    Resistance Training Performed Yes    VAD Patient? No    PAD/SET Patient? No      Pain Assessment   Currently in Pain? No/denies    Multiple Pain Sites No             Capillary Blood Glucose: No results found for this or any previous visit (from the past 24 hour(s)).   Exercise Prescription Changes - 06/27/22 1500       Response to Exercise   Blood Pressure (Admit) 100/60    Blood Pressure (Exercise) 132/70    Blood Pressure (Exit) 106/60    Heart Rate (Admit) 60 bpm    Heart Rate (Exercise) 74 bpm    Heart Rate (Exit) 64 bpm    Oxygen Saturation (Admit) 96 %    Oxygen Saturation (Exercise) 97 %    Oxygen Saturation (Exit) 97 %    Rating of Perceived Exertion (Exercise) 11    Perceived Dyspnea (Exercise) 0    Duration Continue with 30 min of aerobic exercise without signs/symptoms of physical distress.    Intensity THRR unchanged      Progression   Progression Continue to progress workloads to maintain intensity without signs/symptoms of physical distress.      Resistance Training   Training  Prescription Yes    Weight Red bands    Reps 10-15    Time 10 Minutes      NuStep   Level 4    SPM 60    Minutes 15    METs 1.2      Arm Ergometer   Level 1.5    Watts 1    Minutes 15             Social History   Tobacco Use  Smoking Status Former   Packs/day: 1.00   Years: 40.00   Total pack years: 40.00   Types: Cigarettes   Quit date: 06/2021   Years since quitting: 1.0  Smokeless Tobacco Never    Goals Met:  Proper associated with RPD/PD & O2 Sat Exercise tolerated well No report of concerns or symptoms today Strength training completed today  Goals Unmet:  Not Applicable  Comments: Service time is from 1313 to 1440.    Dr. Rodman Pickle is Medical Director for Pulmonary Rehab at [facility].

## 2022-06-28 NOTE — Progress Notes (Signed)
Pulmonary Individual Treatment Plan  Patient Details  Name: Mike Gray MRN: [id number] Date of Birth: 1941/01/24 Referring Provider:   [name] Pulmonary Rehab Walk Test from 05/12/2022 in [place]  Referring Provider [name]       Initial Encounter Date:  Flowsheet Row Pulmonary Rehab Walk Test from 05/12/2022 in [place]  Date 05/12/22       Visit Diagnosis: Stage 4 very severe COPD by GOLD classification ([place])  Patient's Home Medications on Admission:   Current Outpatient Medications:    albuterol (VENTOLIN HFA) 108 (90 Base) MCG/ACT inhaler, Inhale into the lungs., Disp: , Rfl:    albuterol (VENTOLIN HFA) 108 (90 Base) MCG/ACT inhaler, Inhale 2 puffs into the lungs every 6 (six) hours as needed., Disp: 18 g, Rfl: 5   amiodarone (PACERONE) 200 MG tablet, Take 0.5 tablets (100 mg total) by mouth daily., Disp: 45 tablet, Rfl: 1   apixaban (ELIQUIS) 5 MG TABS tablet, Take 1 tablet (5 mg total) by mouth 2 (two) times daily., Disp: 60 tablet, Rfl: 5   atorvastatin (LIPITOR) 20 MG tablet, Take 20 mg by mouth daily., Disp: , Rfl:    divalproex (DEPAKOTE ER) 500 MG 24 hr tablet, Take 1,000 mg by mouth daily. , Disp: , Rfl:    finasteride (PROSCAR) 5 MG tablet, Take 5 mg by mouth daily., Disp: , Rfl:    metoprolol succinate (TOPROL-XL) 50 MG 24 hr tablet, Take 1 tablet (50 mg total) by mouth daily. Take with or immediately following a meal., Disp: 90 tablet, Rfl: 3   niacin 500 MG tablet, Take 500 mg by mouth at bedtime., Disp: , Rfl:    tamsulosin (FLOMAX) 0.4 MG CAPS capsule, Take 1 capsule (0.4 mg total) by mouth daily after supper. (Patient taking differently: Take 0.8 mg by mouth daily after supper.), Disp: 30 capsule, Rfl: 0   Tiotropium Bromide-Olodaterol (STIOLTO RESPIMAT) 2.5-2.5 MCG/ACT AERS, Inhale 2 puffs into the lungs daily., Disp: 4 g, Rfl: 5  Past Medical History: Past Medical History:  Diagnosis  Date   Angiodysplasia    Atrial fibrillation (HCC)    CHF (congestive heart failure) (HCC)    COPD (chronic obstructive pulmonary disease) (HCC)    GI bleed    Hypertension     Tobacco Use: Social History   Tobacco Use  Smoking Status Former   Packs/day: 1.00   Years: 40.00   Total pack years: 40.00   Types: Cigarettes   Quit date: 06/2021   Years since quitting: 1.0  Smokeless Tobacco Never    Labs: Review Flowsheet  More data exists      Latest Ref Rng & Units 07/06/2020 07/07/2020 07/19/2021 07/29/2021 07/31/2021  Labs for ITP Cardiac and Pulmonary Rehab  Cholestrol 0 - 200 mg/dL - - - 87  -  LDL (calc) 0 - 99 mg/dL - - - 40  -  HDL-C >40 mg/dL - - - 34  -  Trlycerides <150 mg/dL - - - 63  -  Hemoglobin A1c 4.8 - 5.6 % - - - - 6.2   PH, Arterial 7.350 - 7.450 7.545  7.555  7.135  - -  PCO2 arterial 32.0 - 48.0 mmHg 38.5  34.3  112  - -  Bicarbonate 20.0 - 28.0 mmol/L 33.4  30.5  36.1  - -  TCO2 22 - 32 mmol/L - - 37  - -  O2 Saturation % 89.2  93.6  99.2  - -  Capillary Blood Glucose: Lab Results  Component Value Date   GLUCAP 151 (H) 07/28/2021   GLUCAP 146 (H) 07/28/2021   GLUCAP 121 (H) 07/28/2021   GLUCAP 175 (H) 07/27/2021   GLUCAP 187 (H) 07/27/2021     Pulmonary Assessment Scores:  Pulmonary Assessment Scores     Row Name 05/12/22 1405         ADL UCSD   SOB Score total 25       CAT Score   CAT Score 12       mMRC Score   mMRC Score 2             UCSD: Self-administered rating of dyspnea associated with activities of daily living (ADLs) 6-point scale (0 = "not at all" to 5 = "maximal or unable to do because of breathlessness")  Scoring Scores range from 0 to 120.  Minimally important difference is 5 units  CAT: CAT can identify the health impairment of COPD patients and is better correlated with disease progression.  CAT has a scoring range of zero to 40. The CAT score is classified into four groups of low (less than 10), medium  (10 - 20), high (21-30) and very high (31-40) based on the impact level of disease on health status. A CAT score over 10 suggests significant symptoms.  A worsening CAT score could be explained by an exacerbation, poor medication adherence, poor inhaler technique, or progression of COPD or comorbid conditions.  CAT MCID is 2 points  mMRC: mMRC (Modified Medical Research Council) Dyspnea Scale is used to assess the degree of baseline functional disability in patients of respiratory disease due to dyspnea. No minimal important difference is established. A decrease in score of 1 point or greater is considered a positive change.   Pulmonary Function Assessment:  Pulmonary Function Assessment - 05/12/22 1525       Breath   Bilateral Breath Sounds Decreased    Shortness of Breath Yes             Exercise Target Goals: Exercise Program Goal: Individual exercise prescription set using results from initial 6 min walk test and THRR while considering  patient's activity barriers and safety.   Exercise Prescription Goal: Initial exercise prescription builds to 30-45 minutes a day of aerobic activity, 2-3 days per week.  Home exercise guidelines will be given to patient during program as part of exercise prescription that the participant will acknowledge.  Activity Barriers & Risk Stratification:  Activity Barriers & Cardiac Risk Stratification - 05/12/22 1353       Activity Barriers & Cardiac Risk Stratification   Activity Barriers Deconditioning;Muscular Weakness;Shortness of Breath;Balance Concerns;Back Problems;Assistive Device;History of Falls    Cardiac Risk Stratification Moderate             6 Minute Walk:  6 Minute Walk     Row Name 05/12/22 1515         6 Minute Walk   Phase Initial     Distance 710 feet     Walk Time 6 minutes     # of Rest Breaks 0     MPH 1.34     METS 1.8     RPE 13     Perceived Dyspnea  1     VO2 Peak 6.29     Symptoms No     Resting HR  74 bpm     Resting BP 124/66     Resting Oxygen Saturation  94 %  Exercise Oxygen Saturation  during 6 min walk 95 %     Max Ex. HR 102 bpm     Max Ex. BP 140/84     2 Minute Post BP 140/84       Interval HR   1 Minute HR 96     2 Minute HR 99     3 Minute HR 99     4 Minute HR 99     5 Minute HR 99     6 Minute HR 102     2 Minute Post HR 85     Interval Heart Rate? Yes       Interval Oxygen   Interval Oxygen? Yes     Baseline Oxygen Saturation % 94 %     1 Minute Oxygen Saturation % 96 %     1 Minute Liters of Oxygen 0 L     2 Minute Oxygen Saturation % 96 %     2 Minute Liters of Oxygen 0 L     3 Minute Oxygen Saturation % 95 %     3 Minute Liters of Oxygen 0 L     4 Minute Oxygen Saturation % 95 %     4 Minute Liters of Oxygen 0 L     5 Minute Oxygen Saturation % 95 %     5 Minute Liters of Oxygen 0 L     6 Minute Oxygen Saturation % 95 %     6 Minute Liters of Oxygen 0 L     2 Minute Post Oxygen Saturation % 97 %     2 Minute Post Liters of Oxygen 0 L              Oxygen Initial Assessment:  Oxygen Initial Assessment - 05/12/22 1351       Home Oxygen   Home Oxygen Device None    Sleep Oxygen Prescription None    Home Exercise Oxygen Prescription None    Home Resting Oxygen Prescription None    Compliance with Home Oxygen Use Yes      Initial 6 min Walk   Oxygen Used None      Program Oxygen Prescription   Program Oxygen Prescription None      Intervention   Short Term Goals To learn and understand importance of monitoring SPO2 with pulse oximeter and demonstrate accurate use of the pulse oximeter.;To learn and understand importance of maintaining oxygen saturations>88%;To learn and demonstrate proper pursed lip breathing techniques or other breathing techniques. ;To learn and demonstrate proper use of respiratory medications    Long  Term Goals Verbalizes importance of monitoring SPO2 with pulse oximeter and return demonstration;Maintenance of O2  saturations>88%;Exhibits proper breathing techniques, such as pursed lip breathing or other method taught during program session;Compliance with respiratory medication             Oxygen Re-Evaluation:  Oxygen Re-Evaluation     [place] Name 05/26/22 0802 06/21/22 1006           Program Oxygen Prescription   Program Oxygen Prescription None None        Home Oxygen   Home Oxygen Device None None      Sleep Oxygen Prescription None None      Home Exercise Oxygen Prescription None None      Home Resting Oxygen Prescription None None      Compliance with Home Oxygen Use Yes Yes        Goals/Expected Outcomes  Short Term Goals To learn and understand importance of monitoring SPO2 with pulse oximeter and demonstrate accurate use of the pulse oximeter.;To learn and understand importance of maintaining oxygen saturations>88%;To learn and demonstrate proper pursed lip breathing techniques or other breathing techniques. ;To learn and demonstrate proper use of respiratory medications To learn and understand importance of monitoring SPO2 with pulse oximeter and demonstrate accurate use of the pulse oximeter.;To learn and understand importance of maintaining oxygen saturations>88%;To learn and demonstrate proper pursed lip breathing techniques or other breathing techniques. ;To learn and demonstrate proper use of respiratory medications      Long  Term Goals Verbalizes importance of monitoring SPO2 with pulse oximeter and return demonstration;Maintenance of O2 saturations>88%;Exhibits proper breathing techniques, such as pursed lip breathing or other method taught during program session;Compliance with respiratory medication Verbalizes importance of monitoring SPO2 with pulse oximeter and return demonstration;Maintenance of O2 saturations>88%;Exhibits proper breathing techniques, such as pursed lip breathing or other method taught during program session;Compliance with respiratory medication       Comments -- Pt continues to not require O2 for exercise.      Goals/Expected Outcomes Compliance and understanding of oxygen saturation monitoring and breathing techniques to decrease shortness of breath. Compliance and understanding of oxygen saturation monitoring and breathing techniques to decrease shortness of breath.               Oxygen Discharge (Final Oxygen Re-Evaluation):  Oxygen Re-Evaluation - 06/21/22 1006       Program Oxygen Prescription   Program Oxygen Prescription None      Home Oxygen   Home Oxygen Device None    Sleep Oxygen Prescription None    Home Exercise Oxygen Prescription None    Home Resting Oxygen Prescription None    Compliance with Home Oxygen Use Yes      Goals/Expected Outcomes   Short Term Goals To learn and understand importance of monitoring SPO2 with pulse oximeter and demonstrate accurate use of the pulse oximeter.;To learn and understand importance of maintaining oxygen saturations>88%;To learn and demonstrate proper pursed lip breathing techniques or other breathing techniques. ;To learn and demonstrate proper use of respiratory medications    Long  Term Goals Verbalizes importance of monitoring SPO2 with pulse oximeter and return demonstration;Maintenance of O2 saturations>88%;Exhibits proper breathing techniques, such as pursed lip breathing or other method taught during program session;Compliance with respiratory medication    Comments Pt continues to not require O2 for exercise.    Goals/Expected Outcomes Compliance and understanding of oxygen saturation monitoring and breathing techniques to decrease shortness of breath.             Initial Exercise Prescription:  Initial Exercise Prescription - 05/12/22 1500       Date of Initial Exercise RX and Referring Provider   Date 05/12/22    Referring Provider [name]    Expected Discharge Date 07/20/22      NuStep   Level 1    SPM 60    Minutes 30    METs 1.5      Prescription  Details   Frequency (times per week) 2    Duration Progress to 30 minutes of continuous aerobic without signs/symptoms of physical distress      Intensity   THRR 40-80% of Max Heartrate 56-112    Ratings of Perceived Exertion 11-13    Perceived Dyspnea 0-4      Progression   Progression Continue progressive overload as per policy without signs/symptoms or physical distress.  Resistance Training   Training Prescription Yes    Weight red bands    Reps 10-15             Perform Capillary Blood Glucose checks as needed.  Exercise Prescription Changes:   Exercise Prescription Changes     Row Name 05/30/22 1600 06/13/22 1500 06/15/22 1500 06/27/22 1500       Response to Exercise   Blood Pressure (Admit) 100/66 105/56 -- 100/60    Blood Pressure (Exercise) 108/60 120/68 -- 132/70    Blood Pressure (Exit) 110/62 104/68 -- 106/60    Heart Rate (Admit) 65 bpm 75 bpm -- 60 bpm    Heart Rate (Exercise) 68 bpm 70 bpm -- 74 bpm    Heart Rate (Exit) 60 bpm 61 bpm -- 64 bpm    Oxygen Saturation (Admit) 94 % 96 % -- 96 %    Oxygen Saturation (Exercise) 96 % 97 % -- 97 %    Oxygen Saturation (Exit) 96 % 98 % -- 97 %    Rating of Perceived Exertion (Exercise) 15 13 -- 11    Perceived Dyspnea (Exercise) 0 0 -- 0    Duration Continue with 30 min of aerobic exercise without signs/symptoms of physical distress. Continue with 30 min of aerobic exercise without signs/symptoms of physical distress. -- Continue with 30 min of aerobic exercise without signs/symptoms of physical distress.    Intensity THRR unchanged THRR unchanged -- THRR unchanged      Progression   Progression Continue to progress workloads to maintain intensity without signs/symptoms of physical distress. Continue to progress workloads to maintain intensity without signs/symptoms of physical distress. -- Continue to progress workloads to maintain intensity without signs/symptoms of physical distress.      Resistance  Training   Training Prescription Yes Yes -- Yes    Weight red bands Red bands -- Red bands    Reps 10-15 10-15 -- 10-15    Time 10 Minutes 10 Minutes -- 10 Minutes      NuStep   Level 2 3 -- 4    SPM 60 60 -- 60    Minutes 15 15 -- 15    METs 1.1 1.3 -- 1.2      Arm Ergometer   Level 1 1 -- 1.5    Watts --  25 rpm 1 -- 1    Minutes 15 15 -- 15      Home Exercise Plan   Plans to continue exercise at -- -- Home (comment) --    Frequency -- -- Add 1 additional day to program exercise sessions. --    Initial Home Exercises Provided -- -- 06/15/22 --             Exercise Comments:   Exercise Comments     Row Name 05/18/22 1523 06/15/22 1521         Exercise Comments Pt completed first day of exercise. Mike Gray exercised for 25 min on the Nustep and 5 min on the arm ergometer. He had a nagging hip pain 2/10 on the Nustep at the end. He was switched to the arm ergometer for the last 5 min. He did not have hip pain on the arm ergometer. He performed the warmup and cooldown standing. Staff placed a chair in front of him for balance during the warmup, but he moved it. Mike Gray needs multiple verbal cues during the exercise session. Discussed home exercise with Mike Gray. He is currently not exercising at home. He is uninterested  in exercising at home. Encouraged him to think about joining a structured program after PR.               Exercise Goals and Review:   Exercise Goals     Row Name 05/12/22 1526 05/26/22 0755 06/21/22 1000         Exercise Goals   Increase Physical Activity Yes Yes Yes     Intervention Provide advice, education, support and counseling about physical activity/exercise needs.;Develop an individualized exercise prescription for aerobic and resistive training based on initial evaluation findings, risk stratification, comorbidities and participant's personal goals. Provide advice, education, support and counseling about physical activity/exercise needs.;Develop an  individualized exercise prescription for aerobic and resistive training based on initial evaluation findings, risk stratification, comorbidities and participant's personal goals. Provide advice, education, support and counseling about physical activity/exercise needs.;Develop an individualized exercise prescription for aerobic and resistive training based on initial evaluation findings, risk stratification, comorbidities and participant's personal goals.     Expected Outcomes Short Term: Attend rehab on a regular basis to increase amount of physical activity.;Long Term: Add in home exercise to make exercise part of routine and to increase amount of physical activity.;Long Term: Exercising regularly at least 3-5 days a week. Short Term: Attend rehab on a regular basis to increase amount of physical activity.;Long Term: Add in home exercise to make exercise part of routine and to increase amount of physical activity.;Long Term: Exercising regularly at least 3-5 days a week. Short Term: Attend rehab on a regular basis to increase amount of physical activity.;Long Term: Add in home exercise to make exercise part of routine and to increase amount of physical activity.;Long Term: Exercising regularly at least 3-5 days a week.     Increase Strength and Stamina Yes Yes Yes     Intervention Provide advice, education, support and counseling about physical activity/exercise needs.;Develop an individualized exercise prescription for aerobic and resistive training based on initial evaluation findings, risk stratification, comorbidities and participant's personal goals. Provide advice, education, support and counseling about physical activity/exercise needs.;Develop an individualized exercise prescription for aerobic and resistive training based on initial evaluation findings, risk stratification, comorbidities and participant's personal goals. Provide advice, education, support and counseling about physical activity/exercise  needs.;Develop an individualized exercise prescription for aerobic and resistive training based on initial evaluation findings, risk stratification, comorbidities and participant's personal goals.     Expected Outcomes Short Term: Increase workloads from initial exercise prescription for resistance, speed, and METs.;Short Term: Perform resistance training exercises routinely during rehab and add in resistance training at home;Long Term: Improve cardiorespiratory fitness, muscular endurance and strength as measured by increased METs and functional capacity (6MWT) Short Term: Increase workloads from initial exercise prescription for resistance, speed, and METs.;Short Term: Perform resistance training exercises routinely during rehab and add in resistance training at home;Long Term: Improve cardiorespiratory fitness, muscular endurance and strength as measured by increased METs and functional capacity (6MWT) Short Term: Increase workloads from initial exercise prescription for resistance, speed, and METs.;Short Term: Perform resistance training exercises routinely during rehab and add in resistance training at home;Long Term: Improve cardiorespiratory fitness, muscular endurance and strength as measured by increased METs and functional capacity (6MWT)     Able to understand and use rate of perceived exertion (RPE) scale Yes Yes Yes     Intervention Provide education and explanation on how to use RPE scale Provide education and explanation on how to use RPE scale Provide education and explanation on how to use RPE scale  Expected Outcomes Short Term: Able to use RPE daily in rehab to express subjective intensity level;Long Term:  Able to use RPE to guide intensity level when exercising independently Short Term: Able to use RPE daily in rehab to express subjective intensity level;Long Term:  Able to use RPE to guide intensity level when exercising independently Short Term: Able to use RPE daily in rehab to express  subjective intensity level;Long Term:  Able to use RPE to guide intensity level when exercising independently     Able to understand and use Dyspnea scale Yes Yes Yes     Intervention Provide education and explanation on how to use Dyspnea scale Provide education and explanation on how to use Dyspnea scale Provide education and explanation on how to use Dyspnea scale     Expected Outcomes Short Term: Able to use Dyspnea scale daily in rehab to express subjective sense of shortness of breath during exertion;Long Term: Able to use Dyspnea scale to guide intensity level when exercising independently Short Term: Able to use Dyspnea scale daily in rehab to express subjective sense of shortness of breath during exertion;Long Term: Able to use Dyspnea scale to guide intensity level when exercising independently Short Term: Able to use Dyspnea scale daily in rehab to express subjective sense of shortness of breath during exertion;Long Term: Able to use Dyspnea scale to guide intensity level when exercising independently     Knowledge and understanding of Target Heart Rate Range (THRR) Yes Yes Yes     Intervention Provide education and explanation of THRR including how the numbers were predicted and where they are located for reference Provide education and explanation of THRR including how the numbers were predicted and where they are located for reference Provide education and explanation of THRR including how the numbers were predicted and where they are located for reference     Expected Outcomes Short Term: Able to state/look up THRR;Long Term: Able to use THRR to govern intensity when exercising independently;Short Term: Able to use daily as guideline for intensity in rehab Short Term: Able to state/look up THRR;Long Term: Able to use THRR to govern intensity when exercising independently;Short Term: Able to use daily as guideline for intensity in rehab Short Term: Able to state/look up THRR;Long Term: Able to  use THRR to govern intensity when exercising independently;Short Term: Able to use daily as guideline for intensity in rehab     Understanding of Exercise Prescription Yes Yes Yes     Intervention Provide education, explanation, and written materials on patient's individual exercise prescription Provide education, explanation, and written materials on patient's individual exercise prescription Provide education, explanation, and written materials on patient's individual exercise prescription     Expected Outcomes Short Term: Able to explain program exercise prescription;Long Term: Able to explain home exercise prescription to exercise independently Short Term: Able to explain program exercise prescription;Long Term: Able to explain home exercise prescription to exercise independently Short Term: Able to explain program exercise prescription;Long Term: Able to explain home exercise prescription to exercise independently              Exercise Goals Re-Evaluation :  Exercise Goals Re-Evaluation     Row Name 05/26/22 0755 06/21/22 1001           Exercise Goal Re-Evaluation   Exercise Goals Review Increase Physical Activity;Increase Strength and Stamina;Able to understand and use rate of perceived exertion (RPE) scale;Able to understand and use Dyspnea scale;Knowledge and understanding of Target Heart Rate Range (THRR);Understanding of Exercise  Prescription Increase Physical Activity;Increase Strength and Stamina;Able to understand and use rate of perceived exertion (RPE) scale;Able to understand and use Dyspnea scale;Knowledge and understanding of Target Heart Rate Range (THRR);Understanding of Exercise Prescription      Comments Mike Gray has completed 3 exercise sessions. He is using the arm ergometer at level 1 averaging 25 rpm for 15 min and the nustep at level 2 for 15 min, averaging 1.4 METs. He is tolerating seated exercise well but has not started progressing, declining progression in his last  session. He performs warm up and cooldown exercises mostly standing.  Will continue to monitor and progress as able. Mike Gray has completed 9 exercise sessions. He has missed 1 session. He is using the arm ergometer at level 1.5 averaging 1 watt for 15 min and the nustep at level 4 for 15 min, averaging 1.3 METs. He is tolerating seated exercise well but is not drastically progressing. He performs warm up and cooldown exercises mostly standing and his technique is improving.  Will continue to monitor and progress as able.      Expected Outcomes Through exercise at rehab and home, the patient will decrease shortness of breath with daily activities and feel confident in carrying out an exercise regimen at home. Through exercise at rehab and home, the patient will decrease shortness of breath with daily activities and feel confident in carrying out an exercise regimen at home.               Discharge Exercise Prescription (Final Exercise Prescription Changes):  Exercise Prescription Changes - 06/27/22 1500       Response to Exercise   Blood Pressure (Admit) 100/60    Blood Pressure (Exercise) 132/70    Blood Pressure (Exit) 106/60    Heart Rate (Admit) 60 bpm    Heart Rate (Exercise) 74 bpm    Heart Rate (Exit) 64 bpm    Oxygen Saturation (Admit) 96 %    Oxygen Saturation (Exercise) 97 %    Oxygen Saturation (Exit) 97 %    Rating of Perceived Exertion (Exercise) 11    Perceived Dyspnea (Exercise) 0    Duration Continue with 30 min of aerobic exercise without signs/symptoms of physical distress.    Intensity THRR unchanged      Progression   Progression Continue to progress workloads to maintain intensity without signs/symptoms of physical distress.      Resistance Training   Training Prescription Yes    Weight Red bands    Reps 10-15    Time 10 Minutes      NuStep   Level 4    SPM 60    Minutes 15    METs 1.2      Arm Ergometer   Level 1.5    Watts 1    Minutes 15              Nutrition:  Target Goals: Understanding of nutrition guidelines, daily intake of sodium <1543m, cholesterol <2056m calories 30% from fat and 7% or less from saturated fats, daily to have 5 or more servings of fruits and vegetables.  Biometrics:  Pre Biometrics - 05/12/22 1301       Pre Biometrics   Grip Strength 20 kg              Nutrition Therapy Plan and Nutrition Goals:  Nutrition Therapy & Goals - 06/22/22 1414       Nutrition Therapy   Diet Heart Healthy Diet    Drug/Food Interactions Statins/Certain  Fruits      Personal Nutrition Goals   Nutrition Goal Patient to choose a variety of fruits, vegetables, whole grains, lean protein/plant protein, lowfat nonfat dairy as part of heart healthy diet.    Personal Goal #2 Patient to limit sodium to <1537m per day    Personal Goal #3 Patient to limit intake of saturated fat, trans fat, refined carbohydrates, and sodium    Comments Per patient, he drinks >1 half gallon of chocolate milk daily. He admits some constipation related to this but is not receptive to changing this at this time. He lives at home with his brother who does the grocery shopping and cooking at home and is supportive of lifestyle changes.      Intervention Plan   Expected Outcomes Long Term Goal: Adherence to prescribed nutrition plan.             Nutrition Assessments:  Nutrition Assessments - 05/25/22 1556       Rate Your Plate Scores   Pre Score 44            MEDIFICTS Score Key: ?70 Need to make dietary changes  40-70 Heart Healthy Diet ? 40 Therapeutic Level Cholesterol Diet  Flowsheet Row PULMONARY REHAB CHRONIC OBSTRUCTIVE PULMONARY DISEASE [place] Your Plate Total Score on Admission 44      Picture Your Plate Scores: <<62Unhealthy dietary pattern with much room for improvement. 41-50 Dietary pattern unlikely to meet recommendations for good health and  room for improvement. 51-60 More healthful dietary pattern, with some room for improvement.  >60 Healthy dietary pattern, although there may be some specific behaviors that could be improved.    Nutrition Goals Re-Evaluation:  Nutrition Goals Re-Evaluation     [place]St. AnthonyName 05/25/22 1527 06/22/22 1414           Goals   Current Weight 162 lb 7.7 oz (73.7 kg) 154 lb 12.2 oz (70.2 kg)      Comment Most recent labs show potassium 5.3, Cr 1.54, GFR 45 Cr 1.54, GFR 45, Potassium 5.3; Weight is down about 4# since starting with our program.      Expected Outcome Declined RD intervention. Patient is precontemplative of nutrition changes. Per patient, he drinks >1 half gallon of chocolate milk daily. Have reviewed hydration recommendations. He admits some constipation related to milk intake but is not receptive to changing this at this time. He lives at home with his brother who does the grocery shopping and cooking at home and is supportive of lifestyle changes.               Nutrition Goals Discharge (Final Nutrition Goals Re-Evaluation):  Nutrition Goals Re-Evaluation - 06/22/22 1414       Goals   Current Weight 154 lb 12.2 oz (70.2 kg)    Comment Cr 1.54, GFR 45, Potassium 5.3; Weight is down about 4# since starting with our program.    Expected Outcome Patient is precontemplative of nutrition changes. Per patient, he drinks >1 half gallon of chocolate milk daily. Have reviewed hydration recommendations. He admits some constipation related to milk intake but is not receptive to changing this at this time. He lives at home with his brother who does the grocery shopping and cooking at home and is supportive of lifestyle changes.             Psychosocial: Target Goals: Acknowledge presence or absence of significant depression and/or stress, maximize coping  skills, provide positive support system. Participant is able to verbalize types and ability to use techniques and skills needed for  reducing stress and depression.  Initial Review & Psychosocial Screening:  Initial Psych Review & Screening - 05/12/22 1343       Initial Review   Current issues with None Identified      Family Dynamics   Good Support System? Yes    Comments Brother Mikki Santee, niece Apolonio Schneiders      Barriers   Psychosocial barriers to participate in program There are no identifiable barriers or psychosocial needs.      Screening Interventions   Interventions Encouraged to exercise             Quality of Life Scores:  Scores of 19 and below usually indicate a poorer quality of life in these areas.  A difference of  2-3 points is a clinically meaningful difference.  A difference of 2-3 points in the total score of the Quality of Life Index has been associated with significant improvement in overall quality of life, self-image, physical symptoms, and general health in studies assessing change in quality of life.  PHQ-9: Review Flowsheet       05/12/2022  Depression screen PHQ 2/9  Decreased Interest 2  Down, Depressed, Hopeless 0  PHQ - 2 Score 2  Altered sleeping 0  Tired, decreased energy 0  Change in appetite 0  Feeling bad or failure about yourself  0  Trouble concentrating 0  Moving slowly or fidgety/restless 0  Suicidal thoughts 0  PHQ-9 Score 2  Difficult doing work/chores Not difficult at all   Interpretation of Total Score  Total Score Depression Severity:  1-4 = Minimal depression, 5-9 = Mild depression, 10-14 = Moderate depression, 15-19 = Moderately severe depression, 20-27 = Severe depression   Psychosocial Evaluation and Intervention:  Psychosocial Evaluation - 05/12/22 1344       Psychosocial Evaluation & Interventions   Interventions Encouraged to exercise with the program and follow exercise prescription    Comments no concerns    Expected Outcomes For Javell to participate in Pulmonary Rehab    Continue Psychosocial Services  No Follow up required              Psychosocial Re-Evaluation:  Psychosocial Re-Evaluation     [place] Name 05/25/22 0942 06/22/22 0947           Psychosocial Re-Evaluation   Current issues with None Identified None Identified      Comments No psychosocial barriers or concerns identified at this time. No psychosocial concerns or barriers identified at this time.  Mike Gray lives with his brother who drives him to all his appointments, provides a supportive environment for Mike Gray to live along with food.  Mike Gray had had a stroke and needs constant supervision which his brother provides.  No signs of depression at this time.      Expected Outcomes For Mike Gray to be without psychosocial concerns while participating in pulmonary rehab. For Mike Gray to continue to be without psychosocial barriers or concerns while participating in pulmonary rehab.      Interventions Encouraged to attend Pulmonary Rehabilitation for the exercise Encouraged to attend Pulmonary Rehabilitation for the exercise      Continue Psychosocial Services  No Follow up required No Follow up required               Psychosocial Discharge (Final Psychosocial Re-Evaluation):  Psychosocial Re-Evaluation - 06/22/22 0947       Psychosocial Re-Evaluation  Current issues with None Identified    Comments No psychosocial concerns or barriers identified at this time.  Mike Gray lives with his brother who drives him to all his appointments, provides a supportive environment for Mike Gray to live along with food.  Mike Gray had had a stroke and needs constant supervision which his brother provides.  No signs of depression at this time.    Expected Outcomes For Mike Gray to continue to be without psychosocial barriers or concerns while participating in pulmonary rehab.    Interventions Encouraged to attend Pulmonary Rehabilitation for the exercise    Continue Psychosocial Services  No Follow up required             Education: Education Goals: Education classes will be provided on a weekly basis,  covering required topics. Participant will state understanding/return demonstration of topics presented.  Learning Barriers/Preferences:  Learning Barriers/Preferences - 05/12/22 1345       Learning Barriers/Preferences   Learning Barriers Hearing    Learning Preferences Audio             Education Topics: Introduction to Pulmonary Rehab Group instruction provided by PowerPoint, verbal discussion, and written material to support subject matter. Instructor reviews what Pulmonary Rehab is, the purpose of the program, and how patients are referred.     Know Your Numbers Group instruction that is supported by a PowerPoint presentation. Instructor discusses importance of knowing and understanding resting, exercise, and post-exercise oxygen saturation, heart rate, and blood pressure. Oxygen saturation, heart rate, blood pressure, rating of perceived exertion, and dyspnea are reviewed along with a normal range for these values.    Exercise for the Pulmonary Patient Group instruction that is supported by a PowerPoint presentation. Instructor discusses benefits of exercise, core components of exercise, frequency, duration, and intensity of an exercise routine, importance of utilizing pulse oximetry during exercise, safety while exercising, and options of places to exercise outside of rehab.  Flowsheet Row PULMONARY REHAB CHRONIC OBSTRUCTIVE PULMONARY DISEASE from 06/22/2022 in [place]  Date 05/25/22  Educator Donnetta Simpers  Instruction Review Code 1- Verbalizes Understanding          MET Level  Group instruction provided by PowerPoint, verbal discussion, and written material to support subject matter. Instructor reviews what METs are and how to increase METs.    Pulmonary Medications Verbally interactive group education provided by instructor with focus on inhaled medications and proper administration. Flowsheet Row PULMONARY REHAB CHRONIC OBSTRUCTIVE  PULMONARY DISEASE from 06/22/2022 in [place]  Date 06/15/22  Educator Donnetta Simpers  Instruction Review Code 1- Verbalizes Understanding       Anatomy and Physiology of the Respiratory System Group instruction provided by PowerPoint, verbal discussion, and written material to support subject matter. Instructor reviews respiratory cycle and anatomical components of the respiratory system and their functions. Instructor also reviews differences in obstructive and restrictive respiratory diseases with examples of each.  Flowsheet Row PULMONARY REHAB CHRONIC OBSTRUCTIVE PULMONARY DISEASE from 06/22/2022 in [place]  Date 06/22/22  Educator Donnetta Simpers  [Handout]  Instruction Review Code 1- Verbalizes Understanding       Oxygen Safety Group instruction provided by PowerPoint, verbal discussion, and written material to support subject matter. There is an overview of "What is Oxygen" and "Why do we need it".  Instructor also reviews how to create a safe environment for oxygen use, the importance of using oxygen as prescribed, and the risks of noncompliance. There is a brief  discussion on traveling with oxygen and resources the patient may utilize.   Oxygen Use Group instruction provided by PowerPoint, verbal discussion, and written material to discuss how supplemental oxygen is prescribed and different types of oxygen supply systems. Resources for more information are provided.    Breathing Techniques Group instruction that is supported by demonstration and informational handouts. Instructor discusses the benefits of pursed lip and diaphragmatic breathing and detailed demonstration on how to perform both.     Risk Factor Reduction Group instruction that is supported by a PowerPoint presentation. Instructor discusses the definition of a risk factor, different risk factors for pulmonary disease, and how the heart and lungs work  together.   MD Day A group question and answer session with a medical doctor that allows participants to ask questions that relate to their pulmonary disease state.   Nutrition for the Pulmonary Patient Group instruction provided by PowerPoint slides, verbal discussion, and written materials to support subject matter. The instructor gives an explanation and review of healthy diet recommendations, which includes a discussion on weight management, recommendations for fruit and vegetable consumption, as well as protein, fluid, caffeine, fiber, sodium, sugar, and alcohol. Tips for eating when patients are short of breath are discussed. Flowsheet Row PULMONARY REHAB CHRONIC OBSTRUCTIVE PULMONARY DISEASE from 06/22/2022 in [place]  Date 06/08/22  Educator Sam RD  [Handout]  Instruction Review Code 1- Verbalizes Understanding        Other Education Group or individual verbal, written, or video instructions that support the educational goals of the pulmonary rehab program.    Knowledge Questionnaire Score:  Knowledge Questionnaire Score - 05/12/22 1520       Knowledge Questionnaire Score   Pre Score 13/18             Core Components/Risk Factors/Patient Goals at Admission:  Personal Goals and Risk Factors at Admission - 05/12/22 1351       Core Components/Risk Factors/Patient Goals on Admission   Improve shortness of breath with ADL's Yes    Intervention Provide education, individualized exercise plan and daily activity instruction to help decrease symptoms of SOB with activities of daily living.    Expected Outcomes Short Term: Improve cardiorespiratory fitness to achieve a reduction of symptoms when performing ADLs;Long Term: Be able to perform more ADLs without symptoms or delay the onset of symptoms             Core Components/Risk Factors/Patient Goals Review:   Goals and Risk Factor Review     Row Name 05/25/22 0943 06/22/22 0950            Core Components/Risk Factors/Patient Goals Review   Personal Goals Review Develop more efficient breathing techniques such as purse lipped breathing and diaphragmatic breathing and practicing self-pacing with activity.;Increase knowledge of respiratory medications and ability to use respiratory devices properly.;Improve shortness of breath with ADL's Increase knowledge of respiratory medications and ability to use respiratory devices properly.;Improve shortness of breath with ADL's;Develop more efficient breathing techniques such as purse lipped breathing and diaphragmatic breathing and practicing self-pacing with activity.      Review Mike Gray has attended 2 exercise sessions, too early to have met his program goals, will continue to increase workloads as tolerated to increase his strengthand stamina Mike Gray is limited by his stroke, he goes through the motions to exercise but does not seem to understand he needs to push harder despite constant reminding.  He continues to work at 1 watt on the arm  ergomenter and 1.3 mets on the nustep.  Mike Gray's mental capacity is what is limiting him.      Expected Outcomes See admission goals. See admission goals.               Core Components/Risk Factors/Patient Goals at Discharge (Final Review):   Goals and Risk Factor Review - 06/22/22 0950       Core Components/Risk Factors/Patient Goals Review   Personal Goals Review Increase knowledge of respiratory medications and ability to use respiratory devices properly.;Improve shortness of breath with ADL's;Develop more efficient breathing techniques such as purse lipped breathing and diaphragmatic breathing and practicing self-pacing with activity.    Review Mike Gray is limited by his stroke, he goes through the motions to exercise but does not seem to understand he needs to push harder despite constant reminding.  He continues to work at 1 watt on the arm ergomenter and 1.3 mets on the nustep.  Mike Gray's mental capacity is  what is limiting him.    Expected Outcomes See admission goals.             ITP Comments: Pt is making expected progress toward Pulmonary Rehab goals after completing 11 sessions. Recommend continued exercise, life style modification, education, and utilization of breathing techniques to increase stamina and strength, while also decreasing shortness of breath with exertion.  Dr. Rodman Pickle is Medical Director for Pulmonary Rehab at [facility].

## 2022-06-29 ENCOUNTER — Encounter (HOSPITAL_COMMUNITY)
Admission: RE | Admit: 2022-06-29 | Discharge: 2022-06-29 | Disposition: A | Discharge status: Home / Self Care | Payer: Medicare Other | Source: Ambulatory Visit | Attending: Internal Medicine | Admitting: Internal Medicine

## 2022-06-29 DIAGNOSIS — J449 Chronic obstructive pulmonary disease, unspecified: Secondary | ICD-10-CM

## 2022-06-29 NOTE — Progress Notes (Signed)
Daily Session Note  Patient Details  Name: Mike Gray MRN: [id number] Date of Birth: 1941-10-20 Referring Provider:   [name] Pulmonary Rehab Walk Test from 05/12/2022 in [place]  Referring Provider [name]       Encounter Date: 06/29/2022  Check In:  Session Check In - 06/29/22 1532       Check-In   Supervising physician immediately available to respond to emergencies [facility] immediately available    Physician(s) Dr. Cyndia Skeeters    Location [facility]    Staff Present Rosebud Poles, RN, Quentin Ore, MS, ACSM-CEP, Exercise Physiologist;Randi Yevonne Pax, ACSM-CEP, Exercise Physiologist;Lisa Ysidro Evert, RN    Virtual Visit No    Medication changes reported     No    Fall or balance concerns reported    No    Tobacco Cessation No Change    Warm-up and Cool-down Performed as group-led instruction    Resistance Training Performed Yes    VAD Patient? No    PAD/SET Patient? No      Pain Assessment   Currently in Pain? No/denies    Multiple Pain Sites No             Capillary Blood Glucose: No results found for this or any previous visit (from the past 24 hour(s)).    Social History   Tobacco Use  Smoking Status Former   Packs/day: 1.00   Years: 40.00   Total pack years: 40.00   Types: Cigarettes   Quit date: 06/2021   Years since quitting: 1.0  Smokeless Tobacco Never    Goals Met:  Proper associated with RPD/PD & O2 Sat Exercise tolerated well No report of concerns or symptoms today Strength training completed today  Goals Unmet:  Not Applicable  Comments: Service time is from 1311 to 1440.    Dr. Rodman Pickle is Medical Director for Pulmonary Rehab at [facility].

## 2022-06-30 ENCOUNTER — Telehealth: Payer: Self-pay

## 2022-06-30 NOTE — Telephone Encounter (Signed)
Remote ICM transmission received.  Attempted call to brother [name] per [facility] regarding ICM remote transmission and left detailed message per DPR.  Advised to return call for any fluid symptoms or questions. Next ICM remote transmission scheduled 09/04/2022.

## 2022-06-30 NOTE — Progress Notes (Signed)
EPIC Encounter for ICM Monitoring  Patient Name: Mike Gray is a 81 y.o. male Date: 06/30/2022 Primary Care Physican: Mike Amel, MD Primary Cardiologist: Mike Gray Electrophysiologist: Mike Gray 05/24/2022 Office Weight: 163 lbs                                                            Attempted call to brother Mike Gray and unable to reach.  Left detailed message per DPR regarding transmission. Transmission reviewed.   Device replacement scheduled for 9/28   Optivol thoracic impedance suggesting normal fluid levels.     Prescribed:  Not prescribed a diuretic    Labs: 06/09/2022 Creatinine 1.58, BUN 48, Potassium 4.6, Sodium 142, GFR 44 05/24/2022 Creatinine 1.54, BUN 32, Potassium 5.3, Sodium 139, GFR 45 11/24/2021 Creatinine 1.51, BUN 39, Potassium 4.0, Sodium 141, GFR 46  complete set of results can be found in Results Review.   Recommendations:  Left voice mail with ICM number and encouraged to call if experiencing any fluid symptoms.   Follow-up plan: ICM clinic phone appointment on 09/04/2022.   91 day device clinic remote transmission pending battery replacement.     EP/Cardiology Office Visits:   08/01/2022 with Mike Gray, [place].  10/25/2022 with Dr. Curt Gray.     Copy of ICM check sent to Dr. Curt Gray.  3 month ICM trend: 06/26/2022.    12-14 Month ICM trend:     Mike Billings, RN 06/30/2022 10:03 AM

## 2022-07-04 ENCOUNTER — Encounter (HOSPITAL_COMMUNITY): Payer: Medicare Other

## 2022-07-06 ENCOUNTER — Encounter (HOSPITAL_COMMUNITY)
Admission: RE | Admit: 2022-07-06 | Discharge: 2022-07-06 | Disposition: A | Discharge status: Home / Self Care | Payer: Medicare Other | Source: Ambulatory Visit | Attending: Internal Medicine | Admitting: Internal Medicine

## 2022-07-06 DIAGNOSIS — J449 Chronic obstructive pulmonary disease, unspecified: Secondary | ICD-10-CM | POA: Diagnosis not present

## 2022-07-06 NOTE — Progress Notes (Signed)
Daily Session Note  Patient Details  Name: Mike Gray MRN: [id number] Date of Birth: 05-12-1941 Referring Provider:   [name] Pulmonary Rehab Walk Test from 05/12/2022 in [place]  Referring Provider [name]       Encounter Date: 07/06/2022  Check In:  Session Check In - 07/06/22 1424       Check-In   Supervising physician immediately available to respond to emergencies [facility] immediately available    Physician(s) Erskine Emery    Location [facility]    Staff Present Elmon Else, MS, ACSM-CEP, Exercise Physiologist;Randi Yevonne Pax, ACSM-CEP, Exercise Physiologist;Jouri Threat Assunta Curtis, MS, Exercise Physiologist    Virtual Visit No    Medication changes reported     No    Fall or balance concerns reported    No    Tobacco Cessation No Change    Warm-up and Cool-down Performed as group-led instruction    Resistance Training Performed Yes    VAD Patient? No    PAD/SET Patient? No      Pain Assessment   Currently in Pain? No/denies    Multiple Pain Sites No             Capillary Blood Glucose: No results found for this or any previous visit (from the past 24 hour(s)).    Social History   Tobacco Use  Smoking Status Former   Packs/day: 1.00   Years: 40.00   Total pack years: 40.00   Types: Cigarettes   Quit date: 06/2021   Years since quitting: 1.0  Smokeless Tobacco Never    Goals Met:  Proper associated with RPD/PD & O2 Sat Exercise tolerated well No report of concerns or symptoms today Strength training completed today  Goals Unmet:  Not Applicable  Comments: Service time is from 1316 to [place]    Dr. Rodman Pickle is Medical Director for Pulmonary Rehab at [facility].

## 2022-07-10 DIAGNOSIS — I5042 Chronic combined systolic (congestive) and diastolic (congestive) heart failure: Secondary | ICD-10-CM

## 2022-07-10 LAB — CBC WITH DIFFERENTIAL/PLATELET
Basophils Absolute: 0 10*3/uL (ref 0.0–0.2)
Basos: 1 %
EOS (ABSOLUTE): 0.1 10*3/uL (ref 0.0–0.4)
Eos: 2 %
Hematocrit: 39.2 % (ref 37.5–51.0)
Hemoglobin: 13 g/dL (ref 13.0–17.7)
Lymphocytes Absolute: 1.1 10*3/uL (ref 0.7–3.1)
Lymphs: 18 %
MCH: 28.4 pg (ref 26.6–33.0)
MCHC: 33.2 g/dL (ref 31.5–35.7)
MCV: 86 fL (ref 79–97)
Monocytes Absolute: 0.8 10*3/uL (ref 0.1–0.9)
Monocytes: 13 %
Neutrophils Absolute: 3.8 10*3/uL (ref 1.4–7.0)
Neutrophils: 66 %
Platelets: 176 10*3/uL (ref 150–450)
RBC: 4.58 x10E6/uL (ref 4.14–5.80)
RDW: 15.9 % — ABNORMAL HIGH (ref 11.6–15.4)
WBC: 5.8 10*3/uL (ref 3.4–10.8)

## 2022-07-10 LAB — BASIC METABOLIC PANEL
BUN/Creatinine Ratio: 18 (ref 10–24)
BUN: 24 mg/dL (ref 8–27)
CO2: 30 mmol/L — ABNORMAL HIGH (ref 20–29)
Calcium: 10 mg/dL (ref 8.6–10.2)
Chloride: 104 mmol/L (ref 96–106)
Creatinine, Ser: 1.35 mg/dL — ABNORMAL HIGH (ref 0.76–1.27)
Glucose: 84 mg/dL (ref 70–99)
Potassium: 4 mmol/L (ref 3.5–5.2)
Sodium: 140 mmol/L (ref 134–144)
eGFR: 53 mL/min/{1.73_m2} — ABNORMAL LOW (ref >59)

## 2022-07-11 ENCOUNTER — Encounter (HOSPITAL_COMMUNITY)
Admission: RE | Admit: 2022-07-11 | Discharge: 2022-07-11 | Disposition: A | Discharge status: Home / Self Care | Payer: Medicare Other | Source: Ambulatory Visit | Attending: Internal Medicine | Admitting: Internal Medicine

## 2022-07-11 VITALS — Wt 163.6 lb

## 2022-07-11 DIAGNOSIS — J449 Chronic obstructive pulmonary disease, unspecified: Secondary | ICD-10-CM

## 2022-07-11 NOTE — Progress Notes (Signed)
Daily Session Note  Patient Details  Name: Mike Gray MRN: [id number] Date of Birth: 07/30/1941 Referring Provider:   [name] Pulmonary Rehab Walk Test from 05/12/2022 in [place]  Referring Provider [name]       Encounter Date: 07/11/2022  Check In:  Session Check In - 07/11/22 1525       Check-In   Supervising physician immediately available to respond to emergencies [facility] - Physician supervision    Physician(s) Erskine Emery    Location [facility]    Staff Present Elmon Else, MS, ACSM-CEP, Exercise Physiologist;Randi Olen Cordial BS, ACSM-CEP, Exercise Physiologist;David Makemson, MS, ACSM-CEP, CCRP, Exercise Physiologist;Carlette Wilber Oliphant, Therapist, sports, BSN;Ramon Dredge, RN, MHA    Virtual Visit No    Medication changes reported     No    Fall or balance concerns reported    No    Tobacco Cessation No Change    Warm-up and Cool-down Performed as group-led instruction    Resistance Training Performed Yes    VAD Patient? No    PAD/SET Patient? No      Pain Assessment   Currently in Pain? No/denies    Multiple Pain Sites No             Capillary Blood Glucose: No results found for this or any previous visit (from the past 24 hour(s)).   Exercise Prescription Changes - 07/11/22 1500       Response to Exercise   Blood Pressure (Admit) 136/60    Blood Pressure (Exercise) 144/82    Blood Pressure (Exit) 126/78    Heart Rate (Admit) 70 bpm    Heart Rate (Exercise) 67 bpm    Heart Rate (Exit) 66 bpm    Oxygen Saturation (Admit) 96 %    Oxygen Saturation (Exercise) 97 %    Oxygen Saturation (Exit) 96 %    Rating of Perceived Exertion (Exercise) 11    Perceived Dyspnea (Exercise) 0    Duration Continue with 30 min of aerobic exercise without signs/symptoms of physical distress.    Intensity THRR unchanged      Progression   Progression Continue to progress workloads to maintain intensity without signs/symptoms of  physical distress.      Resistance Training   Training Prescription Yes    Weight Red bands    Reps 10-15    Time 10 Minutes      NuStep   Level 4    Minutes 15    METs 1.2      Arm Ergometer   Level 1    Watts 1    Minutes 15             Social History   Tobacco Use  Smoking Status Former   Packs/day: 1.00   Years: 40.00   Total pack years: 40.00   Types: Cigarettes   Quit date: 06/2021   Years since quitting: 1.0  Smokeless Tobacco Never    Goals Met:  Proper associated with RPD/PD & O2 Sat Exercise tolerated well No report of concerns or symptoms today Strength training completed today  Goals Unmet:  Not Applicable  Comments: Service time is from 1315 to 1445.    Dr. Rodman Pickle is Medical Director for Pulmonary Rehab at [facility].

## 2022-07-13 ENCOUNTER — Encounter (HOSPITAL_COMMUNITY)
Admission: RE | Admit: 2022-07-13 | Discharge: 2022-07-13 | Disposition: A | Discharge status: Home / Self Care | Payer: Medicare Other | Source: Ambulatory Visit | Attending: Internal Medicine | Admitting: Internal Medicine

## 2022-07-13 DIAGNOSIS — J449 Chronic obstructive pulmonary disease, unspecified: Secondary | ICD-10-CM | POA: Diagnosis not present

## 2022-07-13 NOTE — Progress Notes (Signed)
Daily Session Note  Patient Details  Name: Mike Gray MRN: [id number] Date of Birth: [birth year] Referring Provider:   [name] Pulmonary Rehab Walk Test from 05/12/2022 in [place]  Referring Provider [name]       Encounter Date: 07/13/2022  Check In:  Session Check In - 07/13/22 1429       Check-In   Supervising physician immediately available to respond to emergencies [facility] - Physician supervision    Physician(s) Erskine Emery    Location [facility]    Staff Present Elmon Else, MS, ACSM-CEP, Exercise Physiologist;Genasis Zingale Yevonne Pax, ACSM-CEP, Exercise Physiologist;Carlette Wilber Oliphant, RN, BSN;Samantha [place], RD, LDN;Jetta Walker BS, ACSM-CEP, Exercise Physiologist;Bailey Pearline Cables, MS, Exercise Physiologist    Virtual Visit No    Medication changes reported     No    Fall or balance concerns reported    No    Tobacco Cessation No Change    Warm-up and Cool-down Performed as group-led instruction    Resistance Training Performed Yes    VAD Patient? No    PAD/SET Patient? No      Pain Assessment   Currently in Pain? No/denies    Multiple Pain Sites No             Capillary Blood Glucose: No results found for this or any previous visit (from the past 24 hour(s)).    Social History   Tobacco Use  Smoking Status Former   Packs/day: 1.00   Years: 40.00   Total pack years: 40.00   Types: Cigarettes   Quit date: 06/2021   Years since quitting: 1.0  Smokeless Tobacco Never    Goals Met:  Exercise tolerated well No report of concerns or symptoms today Strength training completed today  Goals Unmet:  Not Applicable  Comments: Service time is from 1307 to 1444.    Dr. Rodman Pickle is Medical Director for Pulmonary Rehab at [facility].

## 2022-07-18 ENCOUNTER — Encounter (HOSPITAL_COMMUNITY)
Admission: RE | Admit: 2022-07-18 | Discharge: 2022-07-18 | Disposition: A | Discharge status: Home / Self Care | Payer: Medicare Other | Source: Ambulatory Visit | Attending: Internal Medicine | Admitting: Internal Medicine

## 2022-07-18 DIAGNOSIS — J449 Chronic obstructive pulmonary disease, unspecified: Secondary | ICD-10-CM

## 2022-07-18 NOTE — Progress Notes (Signed)
Daily Session Note  Patient Details  Name: Mike Gray MRN: [id number] Date of Birth: 25-Sep-1941 Referring Provider:   [name] Pulmonary Rehab Walk Test from 05/12/2022 in [place]  Referring Provider [name]       Encounter Date: 07/18/2022  Check In:  Session Check In - 07/18/22 1533       Check-In   Supervising physician immediately available to respond to emergencies [facility] - Physician supervision    Physician(s) Ernest Mallick    Location [facility]    Staff Present Elmon Else, MS, ACSM-CEP, Exercise Physiologist;Dakayla Disanti Yevonne Pax, ACSM-CEP, Exercise Physiologist;Carlette Wilber Oliphant, RN, BSN;Samantha [place], RD, Sherrye Payor, RN    Virtual Visit No    Medication changes reported     No    Fall or balance concerns reported    No    Tobacco Cessation No Change    Warm-up and Cool-down Performed as group-led instruction    Resistance Training Performed Yes    VAD Patient? No    PAD/SET Patient? No      Pain Assessment   Currently in Pain? No/denies    Multiple Pain Sites No             Capillary Blood Glucose: No results found for this or any previous visit (from the past 24 hour(s)).    Social History   Tobacco Use  Smoking Status Former   Packs/day: 1.00   Years: 40.00   Total pack years: 40.00   Types: Cigarettes   Quit date: 06/2021   Years since quitting: 1.0  Smokeless Tobacco Never    Goals Met:  Independence with exercise equipment Using PLB without cueing & demonstrates good technique Exercise tolerated well Queuing for purse lip breathing No report of concerns or symptoms today Strength training completed today  Goals Unmet:  Not Applicable  Comments: Performed walk test and Service time is from 1315 to 1452.    Dr. Rodman Pickle is Medical Director for Pulmonary Rehab at [facility].

## 2022-07-19 NOTE — Progress Notes (Signed)
Discharge Progress Report  Patient Details  Name: Mike Gray MRN: [id number] Date of Birth: 1941/08/09 Referring Provider:   [name] Walk Test from 05/12/2022 in [place]  Referring Provider [name]        Number of Visits: 16  Reason for Discharge:  Patient has met program and personal goals.  Smoking History:  Social History   Tobacco Use  Smoking Status Former   Packs/day: 1.00   Years: 40.00   Total pack years: 40.00   Types: Cigarettes   Quit date: 06/2021   Years since quitting: 1.0  Smokeless Tobacco Never    Diagnosis:  Stage 4 very severe COPD by GOLD classification ([place])  ADL [facility]:  Pulmonary Assessment Scores     Row Name 05/12/22 1405 07/12/22 0723       ADL [facility]   SOB Score total 25 60      CAT Score   CAT Score 12 8      mMRC Score   mMRC Score 2 --             Initial Exercise Prescription:  Initial Exercise Prescription - 05/12/22 1500       Date of Initial Exercise RX and Referring Provider   Date 05/12/22    Referring Provider [name]    Expected Discharge Date 07/20/22      NuStep   Level 1    SPM 60    Minutes 30    METs 1.5      Prescription Details   Frequency (times per week) 2    Duration Progress to 30 minutes of continuous aerobic without signs/symptoms of physical distress      Intensity   THRR 40-80% of Max Heartrate 56-112    Ratings of Perceived Exertion 11-13    Perceived Dyspnea 0-4      Progression   Progression Continue progressive overload as per policy without signs/symptoms or physical distress.      Resistance Training   Training Prescription Yes    Weight red bands    Reps 10-15             Discharge Exercise Prescription (Final Exercise Prescription Changes):  Exercise Prescription Changes - 07/11/22 1500       Response to Exercise   Blood Pressure (Admit) 136/60    Blood Pressure (Exercise) 144/82    Blood Pressure (Exit)  126/78    Heart Rate (Admit) 70 bpm    Heart Rate (Exercise) 67 bpm    Heart Rate (Exit) 66 bpm    Oxygen Saturation (Admit) 96 %    Oxygen Saturation (Exercise) 97 %    Oxygen Saturation (Exit) 96 %    Rating of Perceived Exertion (Exercise) 11    Perceived Dyspnea (Exercise) 0    Duration Continue with 30 min of aerobic exercise without signs/symptoms of physical distress.    Intensity THRR unchanged      Progression   Progression Continue to progress workloads to maintain intensity without signs/symptoms of physical distress.      Resistance Training   Training Prescription Yes    Weight Red bands    Reps 10-15    Time 10 Minutes      NuStep   Level 4    Minutes 15    METs 1.2      Arm Ergometer   Level 1    Watts 1    Minutes 15  Functional Capacity:  6 Minute Walk     Row Name 05/12/22 1515 07/18/22 1557       6 Minute Walk   Phase Initial Discharge    Distance 710 feet 600 feet    Distance % Change -- -15.5 %    Distance Feet Change -- -110 ft    Walk Time 6 minutes 6 minutes    # of Rest Breaks 0 0    MPH 1.34 1.14    METS 1.8 1.69    RPE 13 13    Perceived Dyspnea  1 2    VO2 Peak 6.29 5.91    Symptoms No Yes (comment)    Comments -- DOE    Resting HR 74 bpm 63 bpm    Resting BP 124/66 146/70    Resting Oxygen Saturation  94 % 99 %    Exercise Oxygen Saturation  during 6 min walk 95 % 94 %    Max Ex. HR 102 bpm 100 bpm    Max Ex. BP 140/84 152/80    2 Minute Post BP 140/84 152/80      Interval HR   1 Minute HR 96 63    2 Minute HR 99 85    3 Minute HR 99 100    4 Minute HR 99 99    5 Minute HR 99 98    6 Minute HR 102 100    2 Minute Post HR 85 81    Interval Heart Rate? Yes Yes      Interval Oxygen   Interval Oxygen? Yes Yes    Baseline Oxygen Saturation % 94 % 99 %    1 Minute Oxygen Saturation % 96 % 96 %    1 Minute Liters of Oxygen 0 L 0 L    2 Minute Oxygen Saturation % 96 % 96 %    2 Minute Liters of Oxygen 0 L  0 L    3 Minute Oxygen Saturation % 95 % 95 %    3 Minute Liters of Oxygen 0 L 0 L    4 Minute Oxygen Saturation % 95 % 94 %    4 Minute Liters of Oxygen 0 L 0 L    5 Minute Oxygen Saturation % 95 % 97 %    5 Minute Liters of Oxygen 0 L 0 L    6 Minute Oxygen Saturation % 95 % 95 %    6 Minute Liters of Oxygen 0 L 0 L    2 Minute Post Oxygen Saturation % 97 % 97 %    2 Minute Post Liters of Oxygen 0 L 0 L             Psychological, QOL, Others - Outcomes: PHQ 2/9:    07/18/2022    4:10 PM 05/12/2022    1:38 PM  Depression screen PHQ 2/9  Decreased Interest 0 2  Down, Depressed, Hopeless 0 0  PHQ - 2 Score 0 2  Altered sleeping 0 0  Tired, decreased energy 1 0  Change in appetite 1 0  Feeling bad or failure about yourself  0 0  Trouble concentrating 0 0  Moving slowly or fidgety/restless 0 0  Suicidal thoughts 0 0  PHQ-9 Score 2 2  Difficult doing work/chores Not difficult at all Not difficult at all    Quality of Life:   Personal Goals: Goals established at orientation with interventions provided to work toward goal.  Personal Goals and Risk Factors  at Admission - 05/12/22 1351       Core Components/Risk Factors/Patient Goals on Admission   Improve shortness of breath with ADL's Yes    Intervention Provide education, individualized exercise plan and daily activity instruction to help decrease symptoms of SOB with activities of daily living.    Expected Outcomes Short Term: Improve cardiorespiratory fitness to achieve a reduction of symptoms when performing ADLs;Long Term: Be able to perform more ADLs without symptoms or delay the onset of symptoms              Personal Goals Discharge:  Goals and Risk Factor Review     Row Name 05/25/22 0943 06/22/22 0950 07/18/22 0836         Core Components/Risk Factors/Patient Goals Review   Personal Goals Review Develop more efficient breathing techniques such as purse lipped breathing and diaphragmatic breathing  and practicing self-pacing with activity.;Increase knowledge of respiratory medications and ability to use respiratory devices properly.;Improve shortness of breath with ADL's Increase knowledge of respiratory medications and ability to use respiratory devices properly.;Improve shortness of breath with ADL's;Develop more efficient breathing techniques such as purse lipped breathing and diaphragmatic breathing and practicing self-pacing with activity. Improve shortness of breath with ADL's;Develop more efficient breathing techniques such as purse lipped breathing and diaphragmatic breathing and practicing self-pacing with activity.     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  He continues to work at 1 watt on the arm ergomenter and 1.3 mets on the nustep.  Ron's mental capacity is what is limiting him. Ron has been exercising on the nustep and using the arm egometer. His progress has been slow to none especially on the arm egometer. This has been due to his mental state, age and his deconditioning.I feel that he has gained more from connecting with his class members and interacting with them than any physicial gains. He seems to forget what is he suppose to be doing and just slow down during exercise and at times stops. He has had social gains attending the program.     Expected Outcomes See admission goals. See admission goals. See admission goals.              Exercise Goals and Review:  Exercise Goals     Row Name 05/12/22 1526 05/26/22 0755 06/21/22 1000 07/17/22 0826       Exercise Goals   Increase Physical Activity Yes Yes Yes Yes    Intervention Provide advice, education, support and counseling about physical  activity/exercise needs.;Develop an individualized exercise prescription for aerobic and resistive training based on initial evaluation findings, risk stratification, comorbidities and participant's personal goals. Provide advice, education, support and counseling about physical activity/exercise needs.;Develop an individualized exercise prescription for aerobic and resistive training based on initial evaluation findings, risk stratification, comorbidities and participant's personal goals. Provide advice, education, support and counseling about physical activity/exercise needs.;Develop an individualized exercise prescription for aerobic and resistive training based on initial evaluation findings, risk stratification, comorbidities and participant's personal goals. Provide advice, education, support and counseling about physical activity/exercise needs.;Develop an individualized exercise prescription for aerobic and resistive training based on initial evaluation findings, risk stratification, comorbidities and participant's personal goals.    Expected Outcomes Short Term:  Attend rehab on a regular basis to increase amount of physical activity.;Long Term: Add in home exercise to make exercise part of routine and to increase amount of physical activity.;Long Term: Exercising regularly at least 3-5 days a week. Short Term: Attend rehab on a regular basis to increase amount of physical activity.;Long Term: Add in home exercise to make exercise part of routine and to increase amount of physical activity.;Long Term: Exercising regularly at least 3-5 days a week. Short Term: Attend rehab on a regular basis to increase amount of physical activity.;Long Term: Add in home exercise to make exercise part of routine and to increase amount of physical activity.;Long Term: Exercising regularly at least 3-5 days a week. Short Term: Attend rehab on a regular basis to increase amount of physical activity.;Long Term: Add in home  exercise to make exercise part of routine and to increase amount of physical activity.;Long Term: Exercising regularly at least 3-5 days a week.    Increase Strength and Stamina Yes Yes Yes Yes    Intervention Provide advice, education, support and counseling about physical activity/exercise needs.;Develop an individualized exercise prescription for aerobic and resistive training based on initial evaluation findings, risk stratification, comorbidities and participant's personal goals. Provide advice, education, support and counseling about physical activity/exercise needs.;Develop an individualized exercise prescription for aerobic and resistive training based on initial evaluation findings, risk stratification, comorbidities and participant's personal goals. Provide advice, education, support and counseling about physical activity/exercise needs.;Develop an individualized exercise prescription for aerobic and resistive training based on initial evaluation findings, risk stratification, comorbidities and participant's personal goals. Provide advice, education, support and counseling about physical activity/exercise needs.;Develop an individualized exercise prescription for aerobic and resistive training based on initial evaluation findings, risk stratification, comorbidities and participant's personal goals.    Expected Outcomes Short Term: Increase workloads from initial exercise prescription for resistance, speed, and METs.;Short Term: Perform resistance training exercises routinely during rehab and add in resistance training at home;Long Term: Improve cardiorespiratory fitness, muscular endurance and strength as measured by increased METs and functional capacity (6MWT) Short Term: Increase workloads from initial exercise prescription for resistance, speed, and METs.;Short Term: Perform resistance training exercises routinely during rehab and add in resistance training at home;Long Term: Improve  cardiorespiratory fitness, muscular endurance and strength as measured by increased METs and functional capacity (6MWT) Short Term: Increase workloads from initial exercise prescription for resistance, speed, and METs.;Short Term: Perform resistance training exercises routinely during rehab and add in resistance training at home;Long Term: Improve cardiorespiratory fitness, muscular endurance and strength as measured by increased METs and functional capacity (6MWT) Short Term: Increase workloads from initial exercise prescription for resistance, speed, and METs.;Short Term: Perform resistance training exercises routinely during rehab and add in resistance training at home;Long Term: Improve cardiorespiratory fitness, muscular endurance and strength as measured by increased METs and functional capacity (6MWT)    Able to understand and use rate of perceived exertion (RPE) scale Yes Yes Yes Yes    Intervention Provide education and explanation on how to use RPE scale Provide education and explanation on how to use RPE scale Provide education and explanation on how to use RPE scale Provide education and explanation on how to use RPE scale    Expected Outcomes Short Term: Able to use RPE daily in rehab to express subjective intensity level;Long Term:  Able to use RPE to guide intensity level when exercising independently Short Term: Able to use RPE daily in rehab to express subjective intensity level;Long Term:  Able to  use RPE to guide intensity level when exercising independently Short Term: Able to use RPE daily in rehab to express subjective intensity level;Long Term:  Able to use RPE to guide intensity level when exercising independently Short Term: Able to use RPE daily in rehab to express subjective intensity level;Long Term:  Able to use RPE to guide intensity level when exercising independently    Able to understand and use Dyspnea scale Yes Yes Yes Yes    Intervention Provide education and explanation on  how to use Dyspnea scale Provide education and explanation on how to use Dyspnea scale Provide education and explanation on how to use Dyspnea scale Provide education and explanation on how to use Dyspnea scale    Expected Outcomes Short Term: Able to use Dyspnea scale daily in rehab to express subjective sense of shortness of breath during exertion;Long Term: Able to use Dyspnea scale to guide intensity level when exercising independently Short Term: Able to use Dyspnea scale daily in rehab to express subjective sense of shortness of breath during exertion;Long Term: Able to use Dyspnea scale to guide intensity level when exercising independently Short Term: Able to use Dyspnea scale daily in rehab to express subjective sense of shortness of breath during exertion;Long Term: Able to use Dyspnea scale to guide intensity level when exercising independently Short Term: Able to use Dyspnea scale daily in rehab to express subjective sense of shortness of breath during exertion;Long Term: Able to use Dyspnea scale to guide intensity level when exercising independently    Knowledge and understanding of Target Heart Rate Range (THRR) Yes Yes Yes Yes    Intervention Provide education and explanation of THRR including how the numbers were predicted and where they are located for reference Provide education and explanation of THRR including how the numbers were predicted and where they are located for reference Provide education and explanation of THRR including how the numbers were predicted and where they are located for reference Provide education and explanation of THRR including how the numbers were predicted and where they are located for reference    Expected Outcomes Short Term: Able to state/look up THRR;Long Term: Able to use THRR to govern intensity when exercising independently;Short Term: Able to use daily as guideline for intensity in rehab Short Term: Able to state/look up THRR;Long Term: Able to use THRR  to govern intensity when exercising independently;Short Term: Able to use daily as guideline for intensity in rehab Short Term: Able to state/look up THRR;Long Term: Able to use THRR to govern intensity when exercising independently;Short Term: Able to use daily as guideline for intensity in rehab Short Term: Able to state/look up THRR;Long Term: Able to use THRR to govern intensity when exercising independently;Short Term: Able to use daily as guideline for intensity in rehab    Understanding of Exercise Prescription Yes Yes Yes Yes    Intervention Provide education, explanation, and written materials on patient's individual exercise prescription Provide education, explanation, and written materials on patient's individual exercise prescription Provide education, explanation, and written materials on patient's individual exercise prescription Provide education, explanation, and written materials on patient's individual exercise prescription    Expected Outcomes Short Term: Able to explain program exercise prescription;Long Term: Able to explain home exercise prescription to exercise independently Short Term: Able to explain program exercise prescription;Long Term: Able to explain home exercise prescription to exercise independently Short Term: Able to explain program exercise prescription;Long Term: Able to explain home exercise prescription to exercise independently Short Term: Able to explain  program exercise prescription;Long Term: Able to explain home exercise prescription to exercise independently             Exercise Goals Re-Evaluation:  Exercise Goals Re-Evaluation     Row Name 05/26/22 0755 06/21/22 1001 07/17/22 0826         Exercise Goal Re-Evaluation   Exercise Goals Review Increase Physical Activity;Increase Strength and Stamina;Able to understand and use rate of perceived exertion (RPE) scale;Able to understand and use Dyspnea scale;Knowledge and understanding of Target Heart Rate  Range (THRR);Understanding of Exercise Prescription Increase Physical Activity;Increase Strength and Stamina;Able to understand and use rate of perceived exertion (RPE) scale;Able to understand and use Dyspnea scale;Knowledge and understanding of Target Heart Rate Range (THRR);Understanding of Exercise Prescription Increase Physical Activity;Increase Strength and Stamina;Able to understand and use rate of perceived exertion (RPE) scale;Able to understand and use Dyspnea scale;Knowledge and understanding of Target Heart Rate Range (THRR);Understanding of Exercise Prescription     Comments Johnross has completed 3 exercise sessions. He is using the arm ergometer at level 1 averaging 25 rpm for 15 min and the nustep at level 2 for 15 min, averaging 1.4 METs. He is tolerating seated exercise well but has not started progressing, declining progression in his last session. He performs warm up and cooldown exercises mostly standing.  Will continue to monitor and progress as able. Alison has completed 9 exercise sessions. He has missed 1 session. He is using the arm ergometer at level 1.5 averaging 1 watt for 15 min and the nustep at level 4 for 15 min, averaging 1.3 METs. He is tolerating seated exercise well but is not drastically progressing. He performs warm up and cooldown exercises mostly standing and his technique is improving.  Will continue to monitor and progress as able. Trinten has completed 15 exercise sessions. He has missed 3 session. He is using the arm ergometer at level 1 averaging 1 watt for 15 min and the nustep at level 4 for 15 min, averaging 1.3 METs. He is tolerating seated exercise well but is not drastically progressing. He does not mentally focus on his exercise or progression. He is scheduled to graduate this week. He has plans to exercise at an outpatient PT facility once he graduates. He performs warm up and cooldown exercises mostly standing and his technique is improving.  Will continue to  monitor and progress as able.     Expected Outcomes Through exercise at rehab and home, the patient will decrease shortness of breath with daily activities and feel confident in carrying out an exercise regimen at home. Through exercise at rehab and home, the patient will decrease shortness of breath with daily activities and feel confident in carrying out an exercise regimen at home. Through exercise at rehab and home, the patient will decrease shortness of breath with daily activities and feel confident in carrying out an exercise regimen at home.              Nutrition & Weight - Outcomes:  Pre Biometrics - 05/12/22 1301       Pre Biometrics   Grip Strength 20 kg             Post Biometrics - 07/18/22 1623        Post  Biometrics   Grip Strength 21 kg             Nutrition:  Nutrition Therapy & Goals - 06/22/22 1414       Nutrition Therapy   Diet Heart Healthy  Diet    Drug/Food Interactions Statins/Certain Fruits      Personal Nutrition Goals   Nutrition Goal Patient to choose a variety of fruits, vegetables, whole grains, lean protein/plant protein, lowfat nonfat dairy as part of heart healthy diet.    Personal Goal #2 Patient to limit sodium to <1573m per day    Personal Goal #3 Patient to limit intake of saturated fat, trans fat, refined carbohydrates, and sodium    Comments Per patient, he drinks >1 half gallon of chocolate milk daily. He admits some constipation related to this but is not receptive to changing this at this time. He lives at home with his brother who does the grocery shopping and cooking at home and is supportive of lifestyle changes.      Intervention Plan   Expected Outcomes Long Term Goal: Adherence to prescribed nutrition plan.             Nutrition Discharge:  Nutrition Assessments - 07/11/22 1549       Rate Your Plate Scores   Post Score 38             Education Questionnaire Score:  Knowledge Questionnaire Score -  07/12/22 0720       Knowledge Questionnaire Score   Post Score 16/18             Goals reviewed with patient; copy given to patient.

## 2022-07-19 NOTE — Pre-Procedure Instructions (Signed)
Attempted to call patient regarding instructions.  Left voicemail on the following items: Arrival time 1230 Nothing to eat or drink after midnight No meds AM of procedure Responsible person to drive you home and stay with you for 24 hrs Wash with special soap night before and morning of procedure If on anti-coagulant drug instructions Eliquis- last dose 9/25

## 2022-07-20 ENCOUNTER — Ambulatory Visit (HOSPITAL_COMMUNITY)
Admission: RE | Disposition: A | Discharge status: Home / Self Care | Payer: Self-pay | Source: Home / Self Care | Attending: Cardiology

## 2022-07-20 ENCOUNTER — Telehealth: Payer: Self-pay | Admitting: Cardiology

## 2022-07-20 ENCOUNTER — Ambulatory Visit (HOSPITAL_COMMUNITY)
Admission: RE | Admit: 2022-07-20 | Discharge: 2022-07-20 | Disposition: A | Discharge status: Home / Self Care | Payer: Medicare Other | Attending: Cardiology | Admitting: Cardiology

## 2022-07-20 ENCOUNTER — Encounter (HOSPITAL_COMMUNITY): Payer: Medicare Other

## 2022-07-20 DIAGNOSIS — I110 Hypertensive heart disease with heart failure: Secondary | ICD-10-CM | POA: Diagnosis not present

## 2022-07-20 DIAGNOSIS — Z87891 Personal history of nicotine dependence: Secondary | ICD-10-CM | POA: Insufficient documentation

## 2022-07-20 DIAGNOSIS — I422 Other hypertrophic cardiomyopathy: Secondary | ICD-10-CM | POA: Diagnosis not present

## 2022-07-20 DIAGNOSIS — I480 Paroxysmal atrial fibrillation: Secondary | ICD-10-CM | POA: Diagnosis not present

## 2022-07-20 DIAGNOSIS — I4892 Unspecified atrial flutter: Secondary | ICD-10-CM | POA: Insufficient documentation

## 2022-07-20 DIAGNOSIS — Z4502 Encounter for adjustment and management of automatic implantable cardiac defibrillator: Secondary | ICD-10-CM | POA: Diagnosis present

## 2022-07-20 DIAGNOSIS — I442 Atrioventricular block, complete: Secondary | ICD-10-CM | POA: Diagnosis not present

## 2022-07-20 DIAGNOSIS — I5022 Chronic systolic (congestive) heart failure: Secondary | ICD-10-CM | POA: Diagnosis not present

## 2022-07-20 DIAGNOSIS — J449 Chronic obstructive pulmonary disease, unspecified: Secondary | ICD-10-CM | POA: Diagnosis not present

## 2022-07-20 HISTORY — PX: ICD GENERATOR CHANGEOUT: EP1231

## 2022-07-20 SURGERY — ICD GENERATOR CHANGEOUT

## 2022-07-20 MED ORDER — FENTANYL CITRATE (PF) 100 MCG/2ML IJ SOLN
INTRAMUSCULAR | Status: AC
  Filled 2022-07-20: qty 2

## 2022-07-20 MED ORDER — SODIUM CHLORIDE 0.9 % IV SOLN
INTRAVENOUS | Status: AC
  Filled 2022-07-20: qty 2

## 2022-07-20 MED ORDER — CEFAZOLIN SODIUM-DEXTROSE 2-4 GM/100ML-% IV SOLN
INTRAVENOUS | Status: AC
  Filled 2022-07-20: qty 100

## 2022-07-20 MED ORDER — MIDAZOLAM HCL 5 MG/5ML IJ SOLN
INTRAMUSCULAR | Status: AC
  Filled 2022-07-20: qty 5

## 2022-07-20 MED ORDER — CEFAZOLIN SODIUM-DEXTROSE 2-4 GM/100ML-% IV SOLN
2.0000 g | INTRAVENOUS | Status: AC
  Administered 2022-07-20: 2 g via INTRAVENOUS

## 2022-07-20 MED ORDER — LIDOCAINE HCL (PF) 1 % IJ SOLN
INTRAMUSCULAR | Status: DC | PRN
  Administered 2022-07-20: 45 mL

## 2022-07-20 MED ORDER — CHLORHEXIDINE GLUCONATE 4 % EX LIQD
4.0000 | Freq: Once | CUTANEOUS | Status: DC
  Filled 2022-07-20: qty 60

## 2022-07-20 MED ORDER — ONDANSETRON HCL 4 MG/2ML IJ SOLN: 4.0000 mg | Freq: Four times a day (QID) | INTRAMUSCULAR | Status: DC | PRN

## 2022-07-20 MED ORDER — MIDAZOLAM HCL 5 MG/5ML IJ SOLN
INTRAMUSCULAR | Status: DC | PRN
  Administered 2022-07-20 (×2): 1 mg via INTRAVENOUS

## 2022-07-20 MED ORDER — SODIUM CHLORIDE 0.9 % IV SOLN: INTRAVENOUS | Status: DC

## 2022-07-20 MED ORDER — FENTANYL CITRATE (PF) 100 MCG/2ML IJ SOLN
INTRAMUSCULAR | Status: DC | PRN
  Administered 2022-07-20 (×2): 25 ug via INTRAVENOUS

## 2022-07-20 MED ORDER — ACETAMINOPHEN 325 MG PO TABS: 325.0000 mg | ORAL_TABLET | ORAL | Status: DC | PRN

## 2022-07-20 MED ORDER — SODIUM CHLORIDE 0.9 % IV SOLN: 80.0000 mg | INTRAVENOUS | Status: DC

## 2022-07-20 SURGICAL SUPPLY — 6 items
CABLE SURGICAL S-101-97-12 (CABLE) ×1 IMPLANT
ICD EVERA DR XT MRI DDMB1D4 (ICD Generator) IMPLANT
PAD DEFIB RADIO PHYSIO CONN (PAD) ×1 IMPLANT
POUCH AIGIS-R ANTIBACT ICD (Mesh General) ×1 IMPLANT
POUCH AIGIS-R ANTIBACT ICD LRG (Mesh General) IMPLANT
TRAY PACEMAKER INSERTION (PACKS) ×1 IMPLANT

## 2022-07-20 NOTE — Telephone Encounter (Signed)
Patient called this morning with questions about his procedure time getting changed and what medications to take.    Pt stated he was called by Anderson Malta, and his Generator change check in time was bumped back from 1030 am to 1230 pm.  I verified this on the procedure schedule, and was correct.  Shared the correct time / affirmed with Pt.   Pt stated he has held his Eliquis for 2 days. Wanted to know if it was ok to take his Amiodarone?  Pt told that it was ok for him to take his medication with a sip of water, since his Generator change is now later.    Pt understood information shared from the instruction letter.  No follow up required at this time.

## 2022-07-20 NOTE — Telephone Encounter (Signed)
[name] calling. He says the patient's battery replacement procedure at the hospital was rescheduled from 10:30 am to 12:30 pm. He would like to know is he is still okay to take his medications this morning.

## 2022-07-20 NOTE — Discharge Instructions (Signed)

## 2022-07-20 NOTE — H&P (Signed)
Electrophysiology Office Note   Date:  07/20/2022   ID:  Mike Gray, DOB [birth year], MRN [id number]  PCP:  [name], MD  Cardiologist:   Primary Electrophysiologist:  Haytham Maher [name], MD    No chief complaint on file.    History of Present Illness: Broc Caspers is a 81 y.o. male who is being seen today for the evaluation of ICD/hypertrophic cardiomyopathy at the request of No ref. provider found. Presenting today for electrophysiology evaluation.    He has a history of atrial fibrillation, CHF, COPD, GI bleed, hypertension.  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  September 2020 was hospitalized with heart failure, atrial fibrillation, and progressive weakness and fatigue.  He was in atrial fibrillation was loaded on amiodarone.  He has a history of atrial fibrillation, CHF, COPD, GI bleed, and hypertension.  He also has an ICD implanted for nonsustained VT with hypertrophic cardiomyopathy.  He has a history of complete heart block in his past as well.  Today, denies symptoms of palpitations, chest pain, shortness of breath, orthopnea, PND, lower extremity edema, claudication, dizziness, presyncope, syncope, bleeding, or neurologic sequela. The patient is tolerating medications without difficulties. Plan gen change today.    Past Medical History:  Diagnosis Date   Angiodysplasia    Atrial fibrillation (HCC)    CHF (congestive heart failure) (HCC)    COPD (chronic obstructive pulmonary disease) ([place])    GI bleed    Hypertension    Past Surgical History:  Procedure Laterality Date   COLONOSCOPY     PACEMAKER IMPLANT       Current Facility-Administered Medications  Medication Dose Route Frequency Provider Last Rate Last Admin   0.9 %  sodium chloride infusion   Intravenous Continuous Geddy Boydstun Hassell Done, MD 50 mL/hr at 07/20/22 1301 New Bag at 07/20/22  1301   ceFAZolin (ANCEF) IVPB 2g/100 mL premix  2 g Intravenous On Call Gregori Abril Hassell Done, MD       chlorhexidine (HIBICLENS) 4 % liquid 4 Application  4 Application Topical Once Jalani Rominger Hassell Done, MD       gentamicin (GARAMYCIN) 80 mg in sodium chloride 0.9 % 500 mL irrigation  80 mg Irrigation On Call Constance Haw, MD        Allergies:   Patient has no known allergies.   Social History:  The patient  reports that he quit smoking about 12 months ago. His smoking use included cigarettes. He has a 40.00 pack-year smoking history. He has never used smokeless tobacco. He reports that he does not currently use alcohol. He reports that he does not use drugs.   Family History:  The patient's family history includes Breast cancer (age of onset: 81) in his father; Cancer in an other family member; Healthy in his mother.   ROS:  Please see the history of present illness.   Otherwise, review of systems is positive for none.   All other systems are reviewed and negative.   PHYSICAL EXAM: VS:  BP (!) 154/79   Pulse 62   Temp 97.6 F (36.4 C) (Oral)   Resp 17   Ht '5\' 8"'$  (1.727 m)   Wt 74.8 kg   SpO2 96%   BMI 25.09 kg/m  , BMI Body mass index is 25.09 kg/m. GEN: Well nourished, well developed, in no acute distress  HEENT: normal  Neck: no JVD, carotid bruits, or masses  Cardiac: RRR; no murmurs, rubs, or gallops,no edema  Respiratory:  clear to auscultation bilaterally, normal work of breathing GI: soft, nontender, nondistended, + BS MS: no deformity or atrophy  Skin: warm and dry, device site well healed Neuro:  Strength and sensation are intact Psych: euthymic mood, full affect  Recent Labs: 08/10/2021: Magnesium 2.1 05/24/2022: ALT 10; TSH 1.930 07/10/2022: BUN 24; Creatinine, Ser 1.35; Hemoglobin 13.0; Platelets 176; Potassium 4.0; Sodium 140    Lipid Panel     Component Value Date/Time   CHOL 87 07/29/2021 0354   TRIG 63 07/29/2021 0354   HDL 34 (L) 07/29/2021 0354    CHOLHDL 2.6 07/29/2021 0354   VLDL 13 07/29/2021 0354   LDLCALC 40 07/29/2021 0354     Wt Readings from Last 3 Encounters:  07/20/22 74.8 kg  07/11/22 74.2 kg  06/27/22 72.2 kg      Other studies Reviewed: Additional studies/ records that were reviewed today include: TTE 07/05/2020  1. Left ventricular ejection fraction, by estimation, is 40 to 45%. The  left ventricle has mildly decreased function. The left ventricle  demonstrates global hypokinesis. There is moderate asymmetric left  ventricular hypertrophy of the septal segment.  Left ventricular diastolic function could not be evaluated.   2. Right ventricular systolic function is normal. The right ventricular  size is normal. A n AICD wire is visualized.   3. The mitral valve is abnormal. Trivial mitral valve regurgitation.   4. The aortic valve is tricuspid. Aortic valve regurgitation is not  visualized. Mild to moderate aortic valve sclerosis/calcification is  present, without any evidence of aortic stenosis.   5. Aortic dilatation noted. There is mild dilatation of the ascending  aorta, measuring 38 mm.   6. The inferior vena cava is dilated in size with >50% respiratory  variability, suggesting right atrial pressure of 8 mmHg.    ASSESSMENT AND PLAN:  1.  Hypertrophic cardiomyopathy:  ICD Criteria  Current LVEF:33%. Within 12 months prior to implant: Yes   Heart failure history: Yes, Class II  Cardiomyopathy history: Yes, Non-Ischemic Cardiomyopathy.  Atrial Fibrillation/Atrial Flutter: Yes, Paroxysmal.  Ventricular tachycardia history: No.  Cardiac arrest history: No.  History of syndromes with risk of sudden death: No.  Previous ICD: Yes, Reason for ICD:  Primary prevention.  Current ICD indication: Primary  PPM indication: Yes. Pacing type: Ventricular. Greater than 40% RV pacing requirement anticipated. Indication: Complete Heart Block  Class I or II Bradycardia indication present: Yes  Beta  Blocker therapy for 3 or more months: Yes, prescribed.   Ace Inhibitor/ARB therapy for 3 or more months: Yes, prescribed.    I have seen Simuel Stebner is a 81 y.o. malepre-procedural and has been referred by [facility] for consideration of ICD ICD generator change for primary prevention of sudden death.  The patient's chart has been reviewed and they meet criteria for ICD implant.  I have had a thorough discussion with the patient reviewing options.  The patient and their family (if available) have had opportunities to ask questions and have them answered. The patient and I have decided together through the [facility] Support Tool to gen change ICD at this time.  Risks, benefits, alternatives to ICD implantation were discussed in detail with the patient today. The patient  understands that the risks include but are not limited to bleeding, infection, pneumothorax, perforation, tamponade, vascular damage, renal failure, MI, stroke, death, inappropriate shocks, and lead dislodgement and  wishes to proceed.

## 2022-07-21 ENCOUNTER — Encounter (HOSPITAL_COMMUNITY): Payer: Self-pay | Admitting: Cardiology

## 2022-07-21 MED FILL — Gentamicin Sulfate Inj 40 MG/ML: INTRAMUSCULAR | Qty: 80 | Status: AC

## 2022-07-26 ENCOUNTER — Ambulatory Visit: Payer: Medicare Other | Admitting: Podiatry

## 2022-07-28 ENCOUNTER — Ambulatory Visit (INDEPENDENT_AMBULATORY_CARE_PROVIDER_SITE_OTHER): Payer: Medicare Other | Admitting: Podiatrist

## 2022-07-28 DIAGNOSIS — M79675 Pain in left toe(s): Secondary | ICD-10-CM | POA: Diagnosis not present

## 2022-07-28 DIAGNOSIS — M79674 Pain in right toe(s): Secondary | ICD-10-CM | POA: Diagnosis not present

## 2022-07-28 DIAGNOSIS — B351 Tinea unguium: Secondary | ICD-10-CM

## 2022-07-28 NOTE — Patient Instructions (Signed)
    Watch your toenails for any signs of infection including drainage, pus redness or swelling along the sides of the toenails.  Soak in epsom salt water and use antibiotic ointment (OTC) if you notice this start to occur.  If the redness does not resolve within 2-3 days, call for an appointment to be seen.

## 2022-07-28 NOTE — Progress Notes (Signed)
Chief Complaint  Patient presents with   Nail Problem    Routine foot care     81 y.o. male presents painful thick toenails that are difficult to trim. Pain interferes with ambulation. Aggravating factors include wearing enclosed shoe gear. Pain is relieved with periodic professional debridement.   New problem(s): None    PCP is [name], [name], MD.   No Known Allergies   Review of Systems: Negative except as noted in the HPI.    Objective:  Vascular Examination: Vascular status intact b/l with palpable pedal pulses. CFT immediate b/l. No edema. No pain with calf compression b/l. Skin temperature gradient WNL b/l. Pedal hair sparse.   Neurological Examination: Sensation grossly intact b/l with 10 gram monofilament. Vibratory sensation intact b/l.    Dermatological Examination: Pedal skin with normal turgor, texture and tone b/l. Toenails 1-5 b/l thick, discolored, elongated with subungual debris and pain on dorsal palpation. No hyperkeratotic lesions noted b/l.    Musculoskeletal Examination: Muscle strength 5/5 to b/l LE. No pain, crepitus or joint limitation noted with ROM bilateral LE. No gross bony deformities bilaterally. 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    Radiographs: None      Assessment:    1. Pain due to onychomycosis of toenails of both feet     Plan:  -Patient was evaluated and treated.  -Patient to continue soft, supportive shoe gear daily. -Mycotic toenails 1-5 bilaterally were debrided in length and girth with sterile nail nippers and dremel without incident. -Patient/POA to call should there be question/concern in the interim.

## 2022-07-31 NOTE — Progress Notes (Unsigned)
Electrophysiology Office Note Date: 07/31/2022  ID:  Mike Gray, DOB 27-Aug-1941, MRN [id number]  PCP: [name], MD Primary Cardiologist: Will [name], MD Electrophysiologist: Constance Haw, MD   CC: Routine ICD follow-up  Mike Gray is a 81 y.o. male seen today for Will [name], MD for routine electrophysiology followup and wound check s/p gen change 07/20/2022. Since last being seen in our clinic the patient reports doing ***.  he denies chest pain, palpitations, dyspnea, PND, orthopnea, nausea, vomiting, dizziness, syncope, edema, weight gain, or early satiety.     {He/she (caps):30048} has not had ICD shocks.   Device History: MDT dual chamber ICD, RA lead is from 2007, RV lead is from 2015 Current device implanted 01/12/2014 He has abandoned RA and RV pacing leads and can not have MRIs Gen change 07/20/2022  Past Medical History:  Diagnosis Date   Angiodysplasia    Atrial fibrillation (HCC)    CHF (congestive heart failure) (HCC)    COPD (chronic obstructive pulmonary disease) ([place])    GI bleed    Hypertension    Past Surgical History:  Procedure Laterality Date   COLONOSCOPY     ICD GENERATOR CHANGEOUT N/A 07/20/2022   Procedure: ICD GENERATOR CHANGEOUT;  Surgeon: Constance Haw, MD;  Location: [facility];  Service: Cardiovascular;  Laterality: N/A;   PACEMAKER IMPLANT      Current Outpatient Medications  Medication Sig Dispense Refill   albuterol (VENTOLIN HFA) 108 (90 Base) MCG/ACT inhaler Inhale 2 puffs into the lungs every 6 (six) hours as needed. (Patient taking differently: Inhale 2 puffs into the lungs 2 (two) times daily.) 18 g 5   amiodarone (PACERONE) 200 MG tablet Take 0.5 tablets (100 mg total) by mouth daily. 45 tablet 1   apixaban (ELIQUIS) 5 MG TABS tablet Take 1 tablet (5 mg total) by mouth 2 (two) times daily. 60 tablet 5   atorvastatin (LIPITOR) 20 MG tablet Take 20 mg by mouth at bedtime.      divalproex (DEPAKOTE ER) 500 MG 24 hr tablet Take 1,000 mg by mouth daily.      finasteride (PROSCAR) 5 MG tablet Take 5 mg by mouth at bedtime.     furosemide (LASIX) 20 MG tablet Take 20 mg by mouth daily as needed for edema.     metoprolol succinate (TOPROL-XL) 50 MG 24 hr tablet Take 1 tablet (50 mg total) by mouth daily. Take with or immediately following a meal. 90 tablet 3   niacin 500 MG tablet Take 500 mg by mouth at bedtime.     tamsulosin (FLOMAX) 0.4 MG CAPS capsule Take 1 capsule (0.4 mg total) by mouth daily after supper. (Patient taking differently: Take 0.8 mg by mouth daily after supper.) 30 capsule 0   Tiotropium Bromide-Olodaterol (STIOLTO RESPIMAT) 2.5-2.5 MCG/ACT AERS Inhale 2 puffs into the lungs daily. (Patient taking differently: Inhale 2 puffs into the lungs every morning.) 4 g 5   No current facility-administered medications for this visit.    Allergies:   Patient has no known allergies.   Social History: Social History   Socioeconomic History   Marital status: Divorced    Spouse name: Not on file   Number of children: Not on file   Years of education: Not on file   Highest education level: Not on file  Occupational History   Not on file  Tobacco Use   Smoking status: Former    Packs/day: 1.00    Years: 40.00  Total pack years: 40.00    Types: Cigarettes    Quit date: 06/2021    Years since quitting: 1.1   Smokeless tobacco: Never  Vaping Use   Vaping Use: Never used  Substance and Sexual Activity   Alcohol use: Not Currently   Drug use: Never   Sexual activity: Not on file  Other Topics Concern   Not on file  Social History Narrative   Not on file   Social Determinants of Health   Financial Resource Strain: Not on file  Food Insecurity: Not on file  Transportation Needs: Not on file  Physical Activity: Not on file  Stress: Not on file  Social Connections: Not on file  Intimate Partner Violence: Not on file    Family History: Family  History  Problem Relation Age of Onset   Healthy Mother    Breast cancer Father 80   Cancer Other     Review of Systems: All other systems reviewed and are otherwise negative except as noted above.   Physical Exam: There were no vitals filed for this visit.   GEN- The patient is well appearing, alert and oriented x 3 today.   HEENT: normocephalic, atraumatic; sclera clear, conjunctiva pink; hearing intact; oropharynx clear; neck supple, no JVP Lymph- no cervical lymphadenopathy Lungs- Clear to ausculation bilaterally, normal work of breathing.  No wheezes, rales, rhonchi Heart- {Blank single:19197::"Regular","Irregularly irregular"}  rate and rhythm, no murmurs, rubs or gallops, PMI not laterally displaced GI- soft, non-tender, non-distended, bowel sounds present, no hepatosplenomegaly Extremities- no clubbing or cyanosis. {EDEMA JYNWG:95621} peripheral edema; DP/PT/radial pulses 2+ bilaterally MS- no significant deformity or atrophy Skin- warm and dry, no rash or lesion; ICD pocket well healed Psych- euthymic mood, full affect Neuro- strength and sensation are intact  ICD interrogation- reviewed in detail today,  See PACEART report  EKG:  EKG {ACTION; IS/IS HYQ:[id number]} ordered today. Personal review of EKG ordered {Blank single:19197::"today","***"} shows ***  Recent Labs: 08/10/2021: Magnesium 2.1 05/24/2022: ALT 10; TSH 1.930 07/10/2022: BUN 24; Creatinine, Ser 1.35; Hemoglobin 13.0; Platelets 176; Potassium 4.0; Sodium 140   Wt Readings from Last 3 Encounters:  07/20/22 165 lb (74.8 kg)  07/11/22 163 lb 9.3 oz (74.2 kg)  06/27/22 159 lb 2.8 oz (72.2 kg)     Other studies Reviewed: Additional studies/ records that were reviewed today include: Previous EP office notes.   Assessment and Plan:  1.  Chronic systolic dysfunction s/p Medtronic dual chamber ICD  S/p gen change 07/20/22 NYHA *** symptoms  euvolemic today Stable on an appropriate medical regimen Normal ICD  function See Pace Art report No changes today  2. PAF *** by device.  Continue eliquis for CHA2DS2VASc  of at least 3. Continue amiodarone, recent labs stable.   3. HTN Stable on current regimen    Current medicines are reviewed at length with the patient today.   =  Labs/ tests ordered today include: *** No orders of the defined types were placed in this encounter.    Disposition:   Follow up with {EPMDS:28135} {Blank single:19197::"in 2 weeks","in 4 weeks","in 3 months","in 6 months","in 12 months","as usual post gen change"}    Signed, Shirley Friar, PA-C  07/31/2022 9:40 AM  [facility] [address] [place] [address] [phone] (office) [phone] (fax)

## 2022-08-01 ENCOUNTER — Ambulatory Visit: Payer: Medicare Other | Attending: Student | Admitting: Student

## 2022-08-01 ENCOUNTER — Encounter: Payer: Self-pay | Admitting: Student

## 2022-08-01 VITALS — BP 134/76 | HR 88 | Ht 68.0 in | Wt 162.8 lb

## 2022-08-01 DIAGNOSIS — Z95810 Presence of automatic (implantable) cardiac defibrillator: Secondary | ICD-10-CM

## 2022-08-01 DIAGNOSIS — I480 Paroxysmal atrial fibrillation: Secondary | ICD-10-CM

## 2022-08-01 DIAGNOSIS — I5022 Chronic systolic (congestive) heart failure: Secondary | ICD-10-CM | POA: Diagnosis present

## 2022-08-01 DIAGNOSIS — I10 Essential (primary) hypertension: Secondary | ICD-10-CM | POA: Diagnosis present

## 2022-08-01 LAB — CUP PACEART INCLINIC DEVICE CHECK
Battery Remaining Longevity: 101 mo
Battery Voltage: 3.09 V
Brady Statistic AP VP Percent: 98.03 %
Brady Statistic AP VS Percent: 0.17 %
Brady Statistic AS VP Percent: 1.78 %
Brady Statistic AS VS Percent: 0.02 %
Brady Statistic RA Percent Paced: 98.05 %
Brady Statistic RV Percent Paced: 99.1 %
Date Time Interrogation Session: 20231010113354
HighPow Impedance: 67 Ohm
Implantable Lead Implant Date: 20070328
Implantable Lead Implant Date: 20150323
Implantable Lead Location: 753859
Implantable Lead Location: 753860
Implantable Lead Model: 5076
Implantable Pulse Generator Implant Date: 20230928
Lead Channel Impedance Value: 342 Ohm
Lead Channel Impedance Value: 399 Ohm
Lead Channel Impedance Value: 475 Ohm
Lead Channel Pacing Threshold Amplitude: 0.625 V
Lead Channel Pacing Threshold Amplitude: 0.75 V
Lead Channel Pacing Threshold Pulse Width: 0.4 ms
Lead Channel Pacing Threshold Pulse Width: 0.4 ms
Lead Channel Sensing Intrinsic Amplitude: 1.625 mV
Lead Channel Sensing Intrinsic Amplitude: 2.375 mV
Lead Channel Sensing Intrinsic Amplitude: 6.75 mV
Lead Channel Sensing Intrinsic Amplitude: 9.5 mV
Lead Channel Setting Pacing Amplitude: 1.5 V
Lead Channel Setting Pacing Amplitude: 2 V
Lead Channel Setting Pacing Pulse Width: 0.4 ms
Lead Channel Setting Sensing Sensitivity: 0.3 mV

## 2022-08-01 LAB — BASIC METABOLIC PANEL
BUN/Creatinine Ratio: 22 (ref 10–24)
BUN: 31 mg/dL — ABNORMAL HIGH (ref 8–27)
CO2: 27 mmol/L (ref 20–29)
Calcium: 10.2 mg/dL (ref 8.6–10.2)
Chloride: 102 mmol/L (ref 96–106)
Creatinine, Ser: 1.42 mg/dL — ABNORMAL HIGH (ref 0.76–1.27)
Glucose: 110 mg/dL — ABNORMAL HIGH (ref 70–99)
Potassium: 4.3 mmol/L (ref 3.5–5.2)
Sodium: 143 mmol/L (ref 134–144)
eGFR: 50 mL/min/{1.73_m2} — ABNORMAL LOW (ref >59)

## 2022-08-01 NOTE — Patient Instructions (Signed)
Medication Instructions:  Your physician recommends that you continue on your current medications as directed. Please refer to the Current Medication list given to you today.  Hold Eliquis until Saturday then resume that morning  *If you need a refill on your cardiac medications before your next appointment, please call your pharmacy*   Lab Work: TODAY: BMET  If you have labs (blood work) drawn today and your tests are completely normal, you will receive your results only by: [place] (if you have MyChart) OR A paper copy in the mail If you have any lab test that is abnormal or we need to change your treatment, we will call you to review the results.   Follow-Up: At [facility], you and your health needs are our priority.  As part of our continuing mission to provide you with exceptional heart care, we have created designated Provider Care Teams.  These Care Teams include your primary Cardiologist (physician) and Advanced Practice Providers (APPs -  Physician Assistants and Nurse Practitioners) who all work together to provide you with the care you need, when you need it.   Your next appointment:   As scheduled  Important Information About Sugar

## 2022-08-02 ENCOUNTER — Encounter: Payer: Self-pay | Admitting: Podiatrist

## 2022-08-16 ENCOUNTER — Ambulatory Visit: Payer: Medicare Other | Admitting: Internal Medicine

## 2022-08-18 ENCOUNTER — Telehealth: Payer: Self-pay | Admitting: Cardiology

## 2022-08-18 NOTE — Telephone Encounter (Signed)
Pt c/o of Chest Pain: STAT if CP now or developed within 24 hours  1. Are you having CP right now? Not right now, but it happened earlier this morning   2. Are you experiencing any other symptoms (ex. SOB, nausea, vomiting, sweating)? No   3. How long have you been experiencing CP? 3 days   4. Is your CP continuous or coming and going? Coming and going   5. Have you taken Nitroglycerin? No    Pt states he has some sharp chest pain on his left side, pt isn't sure if this is related to when he had a battery check.  ?

## 2022-08-18 NOTE — Telephone Encounter (Signed)
I did call patient and confirm that he has been following no lift/push/pull restrictions on the left side of anything heavier than a gallon of milk.    He does confirm that the discomfort is intermittent with certain movements.  Explained that this is not uncommon in the initial weeks to months as the area of the gen change is healing.  I did re-iterate Tina's instructions to [place] Tylenol as needed and if it does not improve or gets worse to call us back and let us know.   Will see if [name] has any further recommendations.

## 2022-08-18 NOTE — Telephone Encounter (Signed)
The patient went down to [facility] for a week and when he got back he reached his left arm down and it was very painful. The patient mentions his generator change on 9/28 but has healed from that and had no pain while he was in [place]. Left arm only in his peck area and only occurs with movement. Advised to the patient that it sounds muscular. Advised to rest the arm, use Tylenol for pain and cold/warm therapy to help as well. The patient verbalized understanding and agreement. Thankful for you call/help.  Will forward to device clinic and MD for further advisement if needed.

## 2022-08-21 ENCOUNTER — Encounter: Payer: Self-pay | Admitting: Internal Medicine

## 2022-08-21 ENCOUNTER — Ambulatory Visit (INDEPENDENT_AMBULATORY_CARE_PROVIDER_SITE_OTHER): Payer: Medicare Other | Admitting: Internal Medicine

## 2022-08-21 VITALS — BP 134/64 | HR 82 | Temp 97.7°F | Ht 67.0 in | Wt 166.8 lb

## 2022-08-21 DIAGNOSIS — J439 Emphysema, unspecified: Secondary | ICD-10-CM

## 2022-08-21 DIAGNOSIS — J4489 Other specified chronic obstructive pulmonary disease: Secondary | ICD-10-CM | POA: Diagnosis not present

## 2022-08-21 NOTE — Progress Notes (Signed)
Mike Gray    [id number]    [birth year]  Primary Care Physician:[name], [name], MD Date of Appointment: 08/21/2022 Established Patient Visit  Chief complaint:   Chief Complaint  Patient presents with   Follow-up    Completed pulmonary rehab 07/20/2022     HPI: Mike Gray is a 81 y.o. man with severe CIOD FEV1 30% of predicted.   Interval Updates: Here for follow up for COPD with his brother.  Feeling ok. Finished pulmonary rehab. Planning to start physical therapy outpatient next week.   No interval hospitalizations or ED visits except for generator change-out for ICD.   Went to [place] to visit friends - was able to travel on the plane by himself.   Still taking stiolto inhaler, with albuterol as needed.   I have reviewed the patient's family social and past medical history and updated as appropriate.   Past Medical History:  Diagnosis Date   Angiodysplasia    Atrial fibrillation (HCC)    CHF (congestive heart failure) (HCC)    COPD (chronic obstructive pulmonary disease) ([place])    GI bleed    Hypertension     Past Surgical History:  Procedure Laterality Date   COLONOSCOPY     ICD GENERATOR CHANGEOUT N/A 07/20/2022   Procedure: ICD GENERATOR CHANGEOUT;  Surgeon: Constance Haw, MD;  Location: [facility];  Service: Cardiovascular;  Laterality: N/A;   PACEMAKER IMPLANT      Family History  Problem Relation Age of Onset   Healthy Mother    Breast cancer Father 19   Cancer Other     Social History   Occupational History   Not on file  Tobacco Use   Smoking status: Former    Packs/day: 1.00    Years: 40.00    Total pack years: 40.00    Types: Cigarettes    Quit date: 06/2021    Years since quitting: 1.1   Smokeless tobacco: Never  Vaping Use   Vaping Use: Never used  Substance and Sexual Activity   Alcohol use: Not Currently   Drug use: Never   Sexual activity: Not on file     Physical Exam: Blood pressure  134/64, pulse 82, temperature 97.7 F (36.5 C), temperature source Oral, height '5\' 7"'$  (1.702 m), weight 166 lb 12.8 oz (75.7 kg), SpO2 96 %.  Gen:      No acute distress, elderly, no distress, room air Lungs:    diminished, no wheezes or crackles CV:         RRR no mrg, no edema   Data Reviewed: Imaging:  PFTs:     Latest Ref Rng & Units 02/14/2022    1:41 PM  PFT Results  FVC-Pre L 2.14   FVC-Predicted Pre % 50   FVC-Post L 2.40   FVC-Predicted Post % 57   Pre FEV1/FVC % % 43   Post FEV1/FCV % % 45   FEV1-Pre L 0.91   FEV1-Predicted Pre % 30   FEV1-Post L 1.08   DLCO uncorrected ml/min/mmHg 10.57   DLCO UNC% % 42   DLCO corrected ml/min/mmHg 10.57   DLCO COR %Predicted % 42   DLVA Predicted % 60    I have personally reviewed the patient's PFTs and severe airflow limitation FEV 1 30% of predicted  Labs: Lab Results  Component Value Date   WBC 5.8 07/10/2022   HGB 13.0 07/10/2022   HCT 39.2 07/10/2022   MCV 86 07/10/2022  PLT 176 07/10/2022   Lab Results  Component Value Date   NA 143 08/01/2022   K 4.3 08/01/2022   CL 102 08/01/2022   CO2 27 08/01/2022    Immunization status: Immunization History  Administered Date(s) Administered   Fluad Quad(high Dose 65+) 06/27/2019   Influenza-Unspecified 07/21/2022   PFIZER(Purple Top)SARS-COV-2 Vaccination 11/29/2019, 12/24/2019   Pneumococcal Conjugate-13 06/27/2019    External Records Personally Reviewed:   Assessment:  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  Plan/Recommendations:  Continue stiolto with albuterol as needed.   Good luck with physical therapy!  Call us if you need [place] sooner or have any issues with your breathing.   MOST form previously completed - he is DNR and DNI.   Return to Care: Return in about 6 months (around 02/20/2023).   Lenice Llamas, MD Pulmonary and [place]

## 2022-08-21 NOTE — Patient Instructions (Addendum)
Please schedule follow up scheduled with myself in 6 months.  If my schedule is not open yet, we will contact you with a reminder closer to that time. Please call [phone] if you haven't heard from [place] a month before.   Continue stiolto with albuterol as needed.   Good luck with physical therapy!  Call us if you need [place] sooner or have any issues with your breathing.

## 2022-08-27 ENCOUNTER — Other Ambulatory Visit: Payer: Self-pay | Admitting: Cardiology

## 2022-09-07 ENCOUNTER — Telehealth: Payer: Self-pay

## 2022-09-07 NOTE — Telephone Encounter (Signed)
LMOVM for patient to send missed ICM transmission with his home remote monitor.

## 2022-09-09 ENCOUNTER — Other Ambulatory Visit: Payer: Self-pay | Admitting: Internal Medicine

## 2022-10-04 NOTE — Progress Notes (Signed)
Unable to reach patient for monthly ICM remote follow up and not receiving monthly remote transmission.  Patient disenrolled due patient is not actively participating in ICM clinic.  Device clinic 91 day remote monitoring will continue per protocol.   ?

## 2022-10-20 ENCOUNTER — Ambulatory Visit (INDEPENDENT_AMBULATORY_CARE_PROVIDER_SITE_OTHER): Payer: Medicare Other

## 2022-10-20 DIAGNOSIS — I422 Other hypertrophic cardiomyopathy: Secondary | ICD-10-CM

## 2022-10-25 ENCOUNTER — Encounter: Payer: Medicare Other | Admitting: Cardiology

## 2022-10-25 LAB — CUP PACEART REMOTE DEVICE CHECK
Battery Remaining Longevity: 100 mo
Battery Voltage: 3.05 V
Brady Statistic AP VP Percent: 98.87 %
Brady Statistic AP VS Percent: 0.15 %
Brady Statistic AS VP Percent: 0.96 %
Brady Statistic AS VS Percent: 0.01 %
Brady Statistic RA Percent Paced: 98.9 %
Brady Statistic RV Percent Paced: 99.16 %
Date Time Interrogation Session: 20240102153831
HighPow Impedance: 70 Ohm
Implantable Lead Connection Status: 753985
Implantable Lead Connection Status: 753985
Implantable Lead Implant Date: 20070328
Implantable Lead Implant Date: 20150323
Implantable Lead Location: 753859
Implantable Lead Location: 753860
Implantable Lead Model: 5076
Implantable Pulse Generator Implant Date: 20230928
Lead Channel Impedance Value: 342 Ohm
Lead Channel Impedance Value: 418 Ohm
Lead Channel Impedance Value: 513 Ohm
Lead Channel Pacing Threshold Amplitude: 0.625 V
Lead Channel Pacing Threshold Amplitude: 0.625 V
Lead Channel Pacing Threshold Pulse Width: 0.4 ms
Lead Channel Pacing Threshold Pulse Width: 0.4 ms
Lead Channel Sensing Intrinsic Amplitude: 1.25 mV
Lead Channel Sensing Intrinsic Amplitude: 1.25 mV
Lead Channel Sensing Intrinsic Amplitude: 8 mV
Lead Channel Sensing Intrinsic Amplitude: 8 mV
Lead Channel Setting Pacing Amplitude: 1.5 V
Lead Channel Setting Pacing Amplitude: 2 V
Lead Channel Setting Pacing Pulse Width: 0.4 ms
Lead Channel Setting Sensing Sensitivity: 0.3 mV
Zone Setting Status: 755011
Zone Setting Status: 755011

## 2022-11-03 ENCOUNTER — Telehealth: Payer: Self-pay | Admitting: *Deleted

## 2022-11-03 NOTE — Telephone Encounter (Signed)
   Pre-operative Risk Assessment    Patient Name: Mike Gray  DOB: [birth year] MRN: [id number]      Request for Surgical Clearance    Procedure:   HERNIA SURGERY   Date of Surgery:  Clearance TBD                                Surgeon:  DR. Ralene Ok Surgeon's Group or Practice Name:  [facility] Phone number:  [phone] Fax number:  [phone] ATTN: Carlene Coria, CMA   Type of Clearance Requested:   - Medical  - Pharmacy:  Hold Apixaban (Eliquis)     Type of Anesthesia:  General    Additional requests/questions:    Jiles Prows   11/03/2022, 1:22 PM

## 2022-11-03 NOTE — Telephone Encounter (Signed)
Primary [place], MD   Preoperative team, please contact this patient and set up a phone call appointment or alternatively, he can wait until his appointment with [name] on 12/13/22 at which time clearance can be addressed for further preoperative risk assessment. Please obtain consent and complete medication review. Thank you for your help.   I confirm that guidance regarding antiplatelet and oral anticoagulation therapy has been completed and, if necessary, noted below.   Emmaline Life, NP-C  11/03/2022, 2:50 PM [address] Office [phone] Fax [phone]

## 2022-11-03 NOTE — Telephone Encounter (Signed)
Patient with diagnosis of PAF(paroxysmal atrial fibrillation) on Eliquis for anticoagulation.    Procedure: Hernia surgery  Date of procedure: TBD   CHA2DS2-VASc Score = 4   This indicates a 4.8% annual risk of stroke. The patient's score is based upon: CHF History: 1 HTN History: 1 Diabetes History: 0 Stroke History: 0 Vascular Disease History: 0 Age Score: 2 Gender Score: 0     CrCl 40 ml/min (SrCr. 1.42 08/01/2022) Platelet count 176 (07/10/2022)  Per office protocol, patient can hold Eliquis for 2 days prior to procedure.   Patient will not need bridging with Lovenox (enoxaparin) around procedure.  **This guidance is not considered finalized until pre-operative APP has relayed final recommendations.**

## 2022-11-06 NOTE — Telephone Encounter (Signed)
Spoke with the patients brother, [name], ok per dpr, and he states the patient wants to have the surgery as soon as possible. He states he would prefer to bring his brother in the office. Pre op clearance appointment scheduled with Ambrose Pancoast, NP on 11/10/22. Robert agreeable and thanked me for calling.

## 2022-11-06 NOTE — Progress Notes (Signed)
Remote ICD transmission.   

## 2022-11-09 NOTE — Progress Notes (Signed)
Office Visit    Patient Name: Mike Gray Date of Encounter: 11/09/2022  Primary Care Provider:  [name], MD Primary Cardiologist:  Will [name], MD Primary Electrophysiologist: Will [name], MD  Chief Complaint    Mike Gray is a 82 y.o. male with PMH of HCM, NSVT, CHB s/p ICD (VT and HCM), PAF (on Eliquis), HTN, CHF and COPD, Bipolar disorder who presents today for surgical clearance for upcoming hernia surgery.  Past Medical History    Past Medical History:  Diagnosis Date   Angiodysplasia    Atrial fibrillation (HCC)    CHF (congestive heart failure) (HCC)    COPD (chronic obstructive pulmonary disease) ([place])    GI bleed    Hypertension    Past Surgical History:  Procedure Laterality Date   COLONOSCOPY     ICD GENERATOR CHANGEOUT N/A 07/20/2022   Procedure: ICD GENERATOR CHANGEOUT;  Surgeon: Constance Haw, MD;  Location: [facility];  Service: Cardiovascular;  Laterality: N/A;   PACEMAKER IMPLANT      Allergies  No Known Allergies  History of Present Illness    Mike Gray  is a 82 year old male with the above mention past medical history who presents today for surgical clearance of upcoming hernia surgery.  Mike Gray was first seen by [name] and 2019 by request of his PCP for management of  ICD/hypertrophic cardiomyopathy.  He had a Medtronic dual-chamber ICD placed in 2007 he was hospitalized on 06/2020 with shortness of breath and dyspnea on exertion with weakness and fatigue.  Device download was completed showing multiple episodes of NSVT.  His beta-blocker was increased to 200 mg but was unable to tolerate titration and reduce back to 100 mg.  He presented to the ED and was found to be hypoxic with O2 sat of 83% on room air he required BiPAP and CT of the head was completed that was negative for acute abnormalities.  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  His BNP was noted to be elevated.  He was found to be in atrial fibrillation and was loaded on amiodarone and converted to sinus rhythm.  He had a generator change out on 07/20/2022 that was successful.  2D echo was completed 05/2022 with reduced EF of 30-35% secondary to medications being adjusted for AKI.  He was seen in follow-up by Oda Kilts, PA and was stable on appropriate medications with NYHA class II symptoms but euvolemic.  He refused medication titrations due to recent AKI on losartan.  No further medication changes were made at that time.  Mike Gray presents today for preoperative clearance visit with his brother.  Since last being seen in the office patient reports that he is doing well with no new cardiac complaints.  His blood pressure today is well-controlled at 126/80 and heart rate was 62 bpm.  He reports compliance with current medication regimen and denies any adverse reactions.  He is euvolemic on examination with trace lower extremity edema and lower extremities.  He is scheduled to undergo ventral hernia repair surgery.  Patient denies chest pain, palpitations, dyspnea, PND, orthopnea, nausea, vomiting, dizziness, syncope, edema, weight gain, or early satiety.   Home Medications    Current Outpatient Medications  Medication Sig Dispense Refill   albuterol (VENTOLIN HFA) 108 (90 Base) MCG/ACT inhaler Inhale 2 puffs into the  lungs every 6 (six) hours as needed. (Patient taking differently: Inhale 2 puffs into the lungs 2 (two) times daily.) 18 g 5   amiodarone (PACERONE) 200 MG tablet Take 1/2 (one-half) tablet by mouth once daily 45 tablet 3   apixaban (ELIQUIS) 5 MG TABS tablet Take 1 tablet (5 mg total) by mouth 2 (two) times daily. 60 tablet 5   atorvastatin (LIPITOR) 20 MG tablet Take 20 mg by mouth at bedtime.     divalproex (DEPAKOTE ER) 500 MG 24 hr tablet Take 1,000 mg by mouth daily.       finasteride (PROSCAR) 5 MG tablet Take 5 mg by mouth at bedtime.     furosemide (LASIX) 20 MG tablet Take 20 mg by mouth daily as needed for edema.     metoprolol succinate (TOPROL-XL) 50 MG 24 hr tablet Take 1 tablet (50 mg total) by mouth daily. Take with or immediately following a meal. 90 tablet 3   niacin 500 MG tablet Take 500 mg by mouth at bedtime.     tamsulosin (FLOMAX) 0.4 MG CAPS capsule Take 1 capsule (0.4 mg total) by mouth daily after supper. (Patient taking differently: Take 0.8 mg by mouth daily after supper.) 30 capsule 0   Tiotropium Bromide-Olodaterol (STIOLTO RESPIMAT) 2.5-2.5 MCG/ACT AERS INHALE 2 PUFFS BY MOUTH ONCE DAILY 4 g 5   No current facility-administered medications for this visit.     Review of Systems  Please see the history of present illness.    All other systems reviewed and are otherwise negative except as noted above.  Physical Exam    Wt Readings from Last 3 Encounters:  08/21/22 166 lb 12.8 oz (75.7 kg)  08/01/22 162 lb 12.8 oz (73.8 kg)  07/20/22 165 lb (74.8 kg)   IH:KVQQV were no vitals filed for this visit.,There is no height or weight on file to calculate BMI.  Constitutional:      Appearance: Healthy appearance. Not in distress.  Neck:     Vascular: JVD normal.  Pulmonary:     Effort: Pulmonary effort is normal.     Breath sounds: No wheezing. No rales. Diminished in the bases Cardiovascular:     Normal rate. Regular rhythm. Normal S1. Normal S2.      Murmurs: There is no murmur.  Edema:    Peripheral edema absent.  Abdominal:     Palpations: Abdomen is soft non tender. There is no hepatomegaly.  Skin:    General: Skin is warm and dry.  Neurological:     General: No focal deficit present.     Mental Status: Alert and oriented to person, place and time.     Cranial Nerves: Cranial nerves are intact.  EKG/LABS/Other Studies Reviewed    ECG personally reviewed by me today -AV dual paced rhythm with a rate of 70 bpm and no acute  changes consistent with previous EKG.  Risk Assessment/Calculations:    CHA2DS2-VASc Score = 4   This indicates a 4.8% annual risk of stroke. The patient's score is based upon: CHF History: 1 HTN History: 1 Diabetes History: 0 Stroke History: 0 Vascular Disease History: 0 Age Score: 2 Gender Score: 0           Lab Results  Component Value Date   WBC 5.8 07/10/2022   HGB 13.0 07/10/2022   HCT 39.2 07/10/2022   MCV 86 07/10/2022   PLT 176 07/10/2022   Lab Results  Component Value Date   CREATININE 1.42 (H) 08/01/2022  BUN 31 (H) 08/01/2022   NA 143 08/01/2022   K 4.3 08/01/2022   CL 102 08/01/2022   CO2 27 08/01/2022   Lab Results  Component Value Date   ALT 10 05/24/2022   AST 22 05/24/2022   ALKPHOS 64 05/24/2022   BILITOT 0.3 05/24/2022   Lab Results  Component Value Date   CHOL 87 07/29/2021   HDL 34 (L) 07/29/2021   LDLCALC 40 07/29/2021   TRIG 63 07/29/2021   CHOLHDL 2.6 07/29/2021    Lab Results  Component Value Date   HGBA1C 6.2 (H) 07/31/2021    Assessment & Plan    1.  Preoperative clearance: -The patient affirms he has been doing well without any new cardiac symptoms. They are able to achieve 4 METS without cardiac limitations. Therefore, based on ACC/AHA guidelines, the patient would be at acceptable risk for the planned procedure without further cardiovascular testing. The patient was advised that if he develops new symptoms prior to surgery to contact our office to arrange for a follow-up visit, and he verbalized understanding.   Mike Gray perioperative risk of a major cardiac event is 6.6% according to the Revised Cardiac Risk Index (RCRI).  Therefore, he is at high risk for perioperative complications.   His functional capacity is fair at 4.31 METs according to the Duke Activity Status Index (DASI). Recommendations: According to ACC/AHA guidelines, no further cardiovascular testing needed.  The patient may proceed to surgery at  acceptable risk.   Antiplatelet and/or Anticoagulation Recommendations:  Eliquis (Apixaban) can be held for 2 days prior to surgery.  Please resume post op when felt to be safe.      2.  Chronic systolic CHF: -Medtronic dual chamber ICD  -2D echo completed 06/2020 showing EF of 30-35% and patient refused med adjustment at that time due to recent AKI. -Today patient is euvolemic on examination -Low sodium diet, fluid restriction <2L, and daily weights encouraged. Educated to contact our office for weight gain of 2 lbs overnight or 5 lbs in one week.    3.  Paroxysmal atrial fibrillation: -Patient currently on amiodarone 100 mg daily and Eliquis 5 mg twice daily -Continue Toprol 50 mg daily  4.  Essential hypertension: -Patient's blood pressure today was well-controlled at 126/80 -Continue Toprol XL 50 mg daily  Disposition: Follow-up with Will [name], MD as scheduled   Medication Adjustments/Labs and Tests Ordered: Current medicines are reviewed at length with the patient today.  Concerns regarding medicines are outlined above.   Signed, Mable Fill, Marissa Nestle, NP 11/09/2022, 5:11 PM [facility]  Note:  This document was prepared using Dragon voice recognition software and may include unintentional dictation errors.

## 2022-11-10 ENCOUNTER — Ambulatory Visit: Payer: Medicare Other | Attending: Nurse Practitioner | Admitting: Nurse Practitioner

## 2022-11-10 ENCOUNTER — Encounter: Payer: Self-pay | Admitting: Nurse Practitioner

## 2022-11-10 VITALS — BP 126/80 | HR 62 | Ht 67.0 in | Wt 167.2 lb

## 2022-11-10 DIAGNOSIS — I10 Essential (primary) hypertension: Secondary | ICD-10-CM

## 2022-11-10 DIAGNOSIS — I480 Paroxysmal atrial fibrillation: Secondary | ICD-10-CM

## 2022-11-10 DIAGNOSIS — Z01810 Encounter for preprocedural cardiovascular examination: Secondary | ICD-10-CM | POA: Diagnosis not present

## 2022-11-10 DIAGNOSIS — I5022 Chronic systolic (congestive) heart failure: Secondary | ICD-10-CM | POA: Diagnosis not present

## 2022-11-10 NOTE — Patient Instructions (Signed)
Medication Instructions:  Your physician recommends that you continue on your current medications as directed. Please refer to the Current Medication list given to you today. *If you need a refill on your cardiac medications before your next appointment, please call your pharmacy*   Lab Work: NONE ORDERED   Testing/Procedures: NONE ORDERED   Follow-Up: At [facility], you and your health needs are our priority.  As part of our continuing mission to provide you with exceptional heart care, we have created designated Provider Care Teams.  These Care Teams include your primary Cardiologist (physician) and Advanced Practice Providers (APPs -  Physician Assistants and Nurse Practitioners) who all work together to provide you with the care you need, when you need it.  We recommend signing up for the patient portal called "MyChart".  Sign up information is provided on this After Visit Summary.  MyChart is used to connect with patients for Virtual Visits (Telemedicine).  Patients are able to view lab/test results, encounter notes, upcoming appointments, etc.  Non-urgent messages can be sent to your provider as well.   To learn more about what you can do with MyChart, go to [email or web address].    Your next appointment:   FOLLOW UP   Provider:    Will [name], MD   Other Instructions [place]

## 2022-11-13 NOTE — Addendum Note (Signed)
Addended by: Michelle Nasuti on: 11/13/2022 08:09 AM   Modules accepted: Orders

## 2022-11-15 ENCOUNTER — Ambulatory Visit: Payer: Self-pay | Admitting: General Surgery

## 2022-11-15 NOTE — H&P (View-Only) (Signed)
Chief Complaint: Follow-up (Inguinal hernia )       History of Present Illness: Mike Gray is a 82 y.o. male who is seen today as an office consultation at the request of Dr. Self for evaluation of Follow-up (Inguinal hernia ) .   Patient is an 82-year-old male, with a history of COPD, CHF, A-fib, paroxysmal V. tach.  Has pacemaker in place.-She is on Eliquis.   Patient comes in for follow-up.  He has had right side inguinal hernia that has gotten larger from his last clinic visit.   Patient states he has pain usually at nighttime with coughing.  He states he has some discomfort on a daily basis at times as well.   Patient and his brother recently state that he had a recent exacerbation of COPD in the last year on several occasions.   He had no signs or symptoms of incarceration or strangulation.     Review of Systems: A complete review of systems was obtained from the patient.  I have reviewed this information and discussed as appropriate with the patient.  See HPI as well for other ROS.   Review of Systems  Constitutional:  Negative for fever.  HENT:  Negative for congestion.   Eyes:  Negative for blurred vision.  Respiratory:  Negative for cough, shortness of breath and wheezing.   Cardiovascular:  Negative for chest pain and palpitations.  Gastrointestinal:  Negative for heartburn.  Genitourinary:  Negative for dysuria.  Musculoskeletal:  Negative for myalgias.  Skin:  Negative for rash.  Neurological:  Negative for dizziness and headaches.  Psychiatric/Behavioral:  Negative for depression and suicidal ideas.   All other systems reviewed and are negative.       Medical History: Past Medical History Past Medical History: Diagnosis Date  CHF (congestive heart failure) (CMS-HCC)    COPD (chronic obstructive pulmonary disease) (CMS-HCC)        There is no problem list on file for this patient.     Past Surgical History History reviewed. No pertinent surgical  history.     Allergies No Known Allergies    Current Outpatient Medications on File Prior to Visit Medication Sig Dispense Refill  AMIOdarone (PACERONE) 200 MG tablet 1/2 tablet      atorvastatin (LIPITOR) 20 MG tablet 1 tablet      divalproex (DEPAKOTE) 500 MG DR tablet Take by mouth      ELIQUIS 2.5 mg tablet 1 tablet      finasteride (PROSCAR) 5 mg tablet 1 tablet      metoprolol succinate (TOPROL-XL) 50 MG XL tablet Take by mouth      niacin (NIASPAN) 500 MG ER tablet        tamsulosin (FLOMAX) 0.4 mg capsule 1 cap       No current facility-administered medications on file prior to visit.     Family History Family History Problem Relation Age of Onset  Breast cancer Father        Social History   Tobacco Use Smoking Status Former  Types: Cigarettes  Quit date: 2021  Years since quitting: 3.0 Smokeless Tobacco Never     Social History Social History    Socioeconomic History  Marital status: Unknown Tobacco Use  Smoking status: Former     Types: Cigarettes     Quit date: 2021     Years since quitting: 3.0  Smokeless tobacco: Never      Objective:     Vitals:   11/02/22 1117 PainSc:     5   There is no height or weight on file to calculate BMI. Physical Exam Constitutional:      Appearance: Normal appearance.  HENT:     Head: Normocephalic and atraumatic.     Nose: Nose normal. No congestion.     Mouth/Throat:     Mouth: Mucous membranes are moist.     Pharynx: Oropharynx is clear.  Eyes:     Pupils: Pupils are equal, round, and reactive to light.  Cardiovascular:     Rate and Rhythm: Normal rate and regular rhythm.     Pulses: Normal pulses.     Heart sounds: Normal heart sounds. No murmur heard.    No friction rub. No gallop.  Pulmonary:     Effort: Pulmonary effort is normal. No respiratory distress.     Breath sounds: Normal breath sounds. No stridor. No wheezing, rhonchi or rales.  Abdominal:     General: Abdomen is flat.      Hernia: A hernia is present. Hernia is present in the right inguinal area.  Musculoskeletal:        General: Normal range of motion.     Cervical back: Normal range of motion.  Skin:    General: Skin is warm and dry.  Neurological:     General: No focal deficit present.     Mental Status: He is alert and oriented to person, place, and time.  Psychiatric:        Mood and Affect: Mood normal.        Thought Content: Thought content normal.          Assessment and Plan: Diagnoses and all orders for this visit:   Unilateral recurrent inguinal hernia without obstruction or gangrene     Mike Gray is a 82 y.o. male    1. I long discussion with the patient and his brother in regards to his hernia.  Patient would be high risk for surgery.  Patient would like to have surgery at this point to get rid of the hernia and the pain. 2.   3. I discussed with him that in the meantime we would try a hernia truss to see if this helps with the discomfort. 4. Will have cardiology stratify his risks. 5. After we have received this information we will have him follow back up to discuss possibility of surgery.  This may be required to be under local/MAC versus general anesthesia.       No follow-ups on file.   Alivya Wegman, MD, FACS [facility]    

## 2022-11-15 NOTE — H&P (Signed)
Chief Complaint: Follow-up (Inguinal hernia )       History of Present Illness: Mike Gray is a 82 y.o. male who is seen today as an office consultation at the request of Dr. Pasty Arch for evaluation of Follow-up (Inguinal hernia ) .   Mike Gray is an 82 year old male, with a history of COPD, CHF, A-fib, paroxysmal V. tach.  Has pacemaker in place.-She is on Eliquis.   Mike Gray comes in for follow-up.  Mike Gray has had right side inguinal hernia that has gotten larger from his last clinic visit.   Mike Gray states Mike Gray has pain usually at nighttime with coughing.  Mike Gray states Mike Gray has some discomfort on a daily basis at times as well.   Mike Gray and his brother recently state that Mike Gray had a recent exacerbation of COPD in the last year on several occasions.   Mike Gray had no signs or symptoms of incarceration or strangulation.     Review of Systems: A complete review of systems was obtained from the Mike Gray.  I have reviewed this information and discussed as appropriate with the Mike Gray.  See HPI as well for other ROS.   Review of Systems  Constitutional:  Negative for fever.  HENT:  Negative for congestion.   Eyes:  Negative for blurred vision.  Respiratory:  Negative for cough, shortness of breath and wheezing.   Cardiovascular:  Negative for chest pain and palpitations.  Gastrointestinal:  Negative for heartburn.  Genitourinary:  Negative for dysuria.  Musculoskeletal:  Negative for myalgias.  Skin:  Negative for rash.  Neurological:  Negative for dizziness and headaches.  Psychiatric/Behavioral:  Negative for depression and suicidal ideas.   All other systems reviewed and are negative.       Medical History: Past Medical History Past Medical History: Diagnosis Date  CHF (congestive heart failure) (CMS-HCC)    COPD (chronic obstructive pulmonary disease) (CMS-HCC)        There is no problem list on file for this Mike Gray.     Past Surgical History History reviewed. No pertinent surgical  history.     Allergies No Known Allergies    Current Outpatient Medications on File Prior to Visit Medication Sig Dispense Refill  AMIOdarone (PACERONE) 200 MG tablet 1/2 tablet      atorvastatin (LIPITOR) 20 MG tablet 1 tablet      divalproex (DEPAKOTE) 500 MG DR tablet Take by mouth      ELIQUIS 2.5 mg tablet 1 tablet      finasteride (PROSCAR) 5 mg tablet 1 tablet      metoprolol succinate (TOPROL-XL) 50 MG XL tablet Take by mouth      niacin (NIASPAN) 500 MG ER tablet        tamsulosin (FLOMAX) 0.4 mg capsule 1 cap       No current facility-administered medications on file prior to visit.     Family History Family History Problem Relation Age of Onset  Breast cancer Father        Social History   Tobacco Use Smoking Status Former  Types: Cigarettes  Quit date: 2021  Years since quitting: 3.0 Smokeless Tobacco Never     Social History Social History    Socioeconomic History  Marital status: Unknown Tobacco Use  Smoking status: Former     Types: Cigarettes     Quit date: 2021     Years since quitting: 3.0  Smokeless tobacco: Never      Objective:     Vitals:   11/02/22 1117 PainSc:  5   There is no height or weight on file to calculate BMI. Physical Exam Constitutional:      Appearance: Normal appearance.  HENT:     Head: Normocephalic and atraumatic.     Nose: Nose normal. No congestion.     Mouth/Throat:     Mouth: Mucous membranes are moist.     Pharynx: Oropharynx is clear.  Eyes:     Pupils: Pupils are equal, round, and reactive to light.  Cardiovascular:     Rate and Rhythm: Normal rate and regular rhythm.     Pulses: Normal pulses.     Heart sounds: Normal heart sounds. No murmur heard.    No friction rub. No gallop.  Pulmonary:     Effort: Pulmonary effort is normal. No respiratory distress.     Breath sounds: Normal breath sounds. No stridor. No wheezing, rhonchi or rales.  Abdominal:     General: Abdomen is flat.      Hernia: A hernia is present. Hernia is present in the right inguinal area.  Musculoskeletal:        General: Normal range of motion.     Cervical back: Normal range of motion.  Skin:    General: Skin is warm and dry.  Neurological:     General: No focal deficit present.     Mental Status: Mike Gray is alert and oriented to person, place, and time.  Psychiatric:        Mood and Affect: Mood normal.        Thought Content: Thought content normal.          Assessment and Plan: Diagnoses and all orders for this visit:   Unilateral recurrent inguinal hernia without obstruction or gangrene     Alyn Jurney is a 82 y.o. male    1. I long discussion with the Mike Gray and his brother in regards to his hernia.  Mike Gray would be high risk for surgery.  Mike Gray would like to have surgery at this point to get rid of the hernia and the pain. 2.   3. I discussed with him that in the meantime we would try a hernia truss to see if this helps with the discomfort. 4. Will have cardiology stratify his risks. 5. After we have received this information we will have him follow back up to discuss possibility of surgery.  This may be required to be under [place] versus general anesthesia.       No follow-ups on file.   Mike Ok, MD, [facility], [place] [facility] Minimally Invasive Surgery

## 2022-11-20 ENCOUNTER — Ambulatory Visit (INDEPENDENT_AMBULATORY_CARE_PROVIDER_SITE_OTHER): Payer: Medicare Other | Admitting: Podiatry

## 2022-11-20 ENCOUNTER — Encounter: Payer: Self-pay | Admitting: Podiatry

## 2022-11-20 VITALS — BP 133/68 | HR 69 | Temp 97.8°F

## 2022-11-20 DIAGNOSIS — B351 Tinea unguium: Secondary | ICD-10-CM

## 2022-11-20 DIAGNOSIS — M79675 Pain in left toe(s): Secondary | ICD-10-CM

## 2022-11-20 DIAGNOSIS — M79674 Pain in right toe(s): Secondary | ICD-10-CM

## 2022-11-20 DIAGNOSIS — Z7901 Long term (current) use of anticoagulants: Secondary | ICD-10-CM | POA: Insufficient documentation

## 2022-11-20 NOTE — Progress Notes (Signed)
  Subjective:  Patient ID: Mike Gray, male    DOB: [birth year],  MRN: [id number]  Mike Gray presents to clinic today for {jgcomplaint:23593}  Chief Complaint  Patient presents with   Follow-up    Routine foot care Not a diabetic Last A1c-n/a PCP- [name] [name],MD/ Last unknown visit     New problem(s): None. {jgcomplaint:23593}  PCP is [name], [name], MD.  No Known Allergies  Review of Systems: Negative except as noted in the HPI.  Objective: No changes noted in today's physical examination. Vitals:   11/20/22 1326 11/20/22 1331  BP: (!) 156/86 133/68  Pulse: 60 69  Temp: 97.8 F (36.6 C)   SpO2: 98% 98%   Mike Gray is a pleasant 82 y.o. male WD, WN in NAD. AAO x 3.  Vascular Examination: Vascular status intact b/l with palpable pedal pulses. CFT immediate b/l. No edema. No pain with calf compression b/l. Skin temperature gradient WNL b/l. Pedal hair sparse.  Neurological Examination: Sensation grossly intact b/l with 10 gram monofilament. Vibratory sensation intact b/l.   Dermatological Examination: Pedal skin with normal turgor, texture and tone b/l. Toenails 1-5 b/l thick, discolored, elongated with subungual debris and pain on dorsal palpation. No hyperkeratotic lesions noted b/l.   Musculoskeletal Examination: Muscle strength 5/5 to b/l LE. No pain, crepitus or joint limitation noted with ROM bilateral LE. No gross bony deformities bilaterally.  Radiographs: None  Assessment/Plan: 1. Pain due to onychomycosis of toenails of both feet     No orders of the defined types were placed in this encounter.   None {Jgplan:23602::"-Patient/POA to call should there be question/concern in the interim."}   Return in about 3 months (around 02/19/2023).  Marzetta Board, DPM

## 2022-11-21 ENCOUNTER — Encounter: Payer: Self-pay | Admitting: Podiatry

## 2022-12-04 ENCOUNTER — Encounter: Payer: Medicare Other | Admitting: Cardiology

## 2022-12-06 NOTE — Pre-Procedure Instructions (Signed)
Surgical Instructions    Your procedure is scheduled on Thursday, February 22nd.  Report to [facility] Main Entrance "A" at 10:45 A.M., then check in with the Admitting office.  Call this number if you have problems the morning of surgery:  [phone]  If you have any questions prior to your surgery date call [phone]: Open Monday-Friday 8am-4pm If you experience any cold or flu symptoms such as cough, fever, chills, shortness of breath, etc. between now and your scheduled surgery, please notify us at the above number.     Remember:  Do not eat after midnight the night before your surgery  You may drink clear liquids until 09:45 AM the morning of your surgery.   Clear liquids allowed are: Water, Non-Citrus Juices (without pulp), Carbonated Beverages, Clear Tea, Black Coffee Only (NO MILK, CREAM OR POWDERED CREAMER of any kind), and Gatorade.   Patient Instructions  The night before surgery:  No food after midnight. ONLY clear liquids after midnight  The day of surgery (if you do NOT have diabetes):  Drink ONE (1) Pre-Surgery Clear Ensure by 09:45 AM the morning of surgery. Drink in one sitting. Do not sip.  This drink was given to you during your hospital  pre-op appointment visit.  Nothing else to drink after completing the  Pre-Surgery Clear Ensure.          If you have questions, please contact your surgeon's office.     Take these medicines the morning of surgery with A SIP OF WATER  albuterol (VENTOLIN HFA)- if needed, bring with you on day of surgery amiodarone (PACERONE)  divalproex (DEPAKOTE ER)  metoprolol succinate (TOPROL-XL)  Tiotropium Bromide-Olodaterol (STIOLTO RESPIMAT)     Follow your surgeon's instructions on when to stop Eliquis.  If no instructions were given by your surgeon then you will need to call the office to get those instructions.     As of today, STOP taking any Aspirin (unless otherwise instructed by your surgeon) Aleve, Naproxen,  Ibuprofen, Motrin, Advil, Goody's, BC's, all herbal medications, fish oil, and all vitamins.                     Do NOT Smoke (Tobacco/Vaping) for 24 hours prior to your procedure.  If you use a CPAP at night, you may bring your mask/headgear for your overnight stay.   Contacts, glasses, piercing's, hearing aid's, dentures or partials may not be worn into surgery, please bring cases for these belongings.    For patients admitted to the hospital, discharge time will be determined by your treatment team.   Patients discharged the day of surgery will not be allowed to drive home, and someone needs to stay with them for 24 hours.  SURGICAL WAITING ROOM VISITATION Patients having surgery or a procedure may have no more than 2 support people in the waiting area - these visitors may rotate.   Children under the age of 65 must have an adult with them who is not the patient. If the patient needs to stay at the hospital during part of their recovery, the visitor guidelines for inpatient rooms apply. Pre-op nurse will coordinate an appropriate time for 1 support person to accompany patient in pre-op.  This support person may not rotate.   Please refer to the [facility] website for the visitor guidelines for Inpatients (after your surgery is over and you are in a regular room).    Special instructions:   [facility]- Preparing For Surgery  Before  surgery, you can play an important role. Because skin is not sterile, your skin needs to be as free of germs as possible. You can reduce the number of germs on your skin by washing with CHG (chlorahexidine gluconate) Soap before surgery.  CHG is an antiseptic cleaner which kills germs and bonds with the skin to continue killing germs even after washing.    Oral Hygiene is also important to reduce your risk of infection.  Remember - BRUSH YOUR TEETH THE MORNING OF SURGERY WITH YOUR REGULAR TOOTHPASTE  Please do not use if you have an allergy to CHG or  antibacterial soaps. If your skin becomes reddened/irritated stop using the CHG.  Do not shave (including legs and underarms) for at least 48 hours prior to first CHG shower. It is OK to shave your face.  Please follow these instructions carefully.   Shower the NIGHT BEFORE SURGERY and the MORNING OF SURGERY  If you chose to wash your hair, wash your hair first as usual with your normal shampoo.  After you shampoo, rinse your hair and body thoroughly to remove the shampoo.  Use CHG Soap as you would any other liquid soap. You can apply CHG directly to the skin and wash gently with a scrungie or a clean washcloth.   Apply the CHG Soap to your body ONLY FROM THE NECK DOWN.  Do not use on open wounds or open sores. Avoid contact with your eyes, ears, mouth and genitals (private parts). Wash Face and genitals (private parts)  with your normal soap.   Wash thoroughly, paying special attention to the area where your surgery will be performed.  Thoroughly rinse your body with warm water from the neck down.  DO NOT shower/wash with your normal soap after using and rinsing off the CHG Soap.  Pat yourself dry with a CLEAN TOWEL.  Wear CLEAN PAJAMAS to bed the night before surgery  Place CLEAN SHEETS on your bed the night before your surgery  DO NOT SLEEP WITH PETS.   Day of Surgery: Take a shower with CHG soap. Do not wear jewelry  Do not wear lotions, powders, colognes, or deodorant. Men may shave face and neck. Do not bring valuables to the hospital. [facility] is not responsible for any belongings or valuables.  Wear Clean/Comfortable clothing the morning of surgery Remember to brush your teeth WITH YOUR REGULAR TOOTHPASTE.   Please read over the following fact sheets that you were given.    If you received a COVID test during your pre-op visit  it is requested that you wear a mask when out in public, stay away from anyone that may not be feeling well and notify your surgeon if  you develop symptoms. If you have been in contact with anyone that has tested positive in the last 10 days please notify you surgeon.

## 2022-12-07 ENCOUNTER — Encounter (HOSPITAL_COMMUNITY): Payer: Self-pay

## 2022-12-07 ENCOUNTER — Other Ambulatory Visit: Payer: Self-pay

## 2022-12-07 ENCOUNTER — Encounter (HOSPITAL_COMMUNITY)
Admission: RE | Admit: 2022-12-07 | Discharge: 2022-12-07 | Disposition: A | Discharge status: Home / Self Care | Payer: Medicare Other | Source: Ambulatory Visit | Attending: General Surgery | Admitting: General Surgery

## 2022-12-07 VITALS — BP 135/82 | HR 85 | Temp 97.5°F | Resp 19 | Ht 67.5 in | Wt 168.0 lb

## 2022-12-07 DIAGNOSIS — Z01812 Encounter for preprocedural laboratory examination: Secondary | ICD-10-CM | POA: Diagnosis present

## 2022-12-07 DIAGNOSIS — Z01818 Encounter for other preprocedural examination: Secondary | ICD-10-CM

## 2022-12-07 DIAGNOSIS — I480 Paroxysmal atrial fibrillation: Secondary | ICD-10-CM | POA: Diagnosis not present

## 2022-12-07 HISTORY — DX: Presence of cardiac pacemaker: Z95.0

## 2022-12-07 LAB — CBC
HCT: 41.5 % (ref 39.0–52.0)
Hemoglobin: 13.5 g/dL (ref 13.0–17.0)
MCH: 29.3 pg (ref 26.0–34.0)
MCHC: 32.5 g/dL (ref 30.0–36.0)
MCV: 90.2 fL (ref 80.0–100.0)
Platelets: 190 10*3/uL (ref 150–400)
RBC: 4.6 MIL/uL (ref 4.22–5.81)
RDW: 14.6 % (ref 11.5–15.5)
WBC: 5.5 10*3/uL (ref 4.0–10.5)
nRBC: 0 % (ref 0.0–0.2)

## 2022-12-07 LAB — BASIC METABOLIC PANEL
Anion gap: 8 (ref 5–15)
BUN: 32 mg/dL — ABNORMAL HIGH (ref 8–23)
CO2: 29 mmol/L (ref 22–32)
Calcium: 9.8 mg/dL (ref 8.9–10.3)
Chloride: 102 mmol/L (ref 98–111)
Creatinine, Ser: 1.46 mg/dL — ABNORMAL HIGH (ref 0.61–1.24)
GFR, Estimated: 48 mL/min — ABNORMAL LOW (ref >60)
Glucose, Bld: 92 mg/dL (ref 70–99)
Potassium: 4.4 mmol/L (ref 3.5–5.1)
Sodium: 139 mmol/L (ref 135–145)

## 2022-12-07 NOTE — Progress Notes (Signed)
PCP - Dr. Lauretta Grill [name] Cardiologist - Dr. Allegra Lai  PPM/ICD - Bi V ICD Device Orders - requested Rep Notified - n/a  Chest x-ray - 08/02/21 EKG - 11/10/22 Stress Test - 06/01/13 ECHO - 06/09/22 Cardiac Cath - denies  Sleep Study - denies   DM- denies    Blood Thinner Instructions: Hold Eliquis 2 days. Last dose 2/19 Aspirin Instructions: n/a  ERAS Protcol - yes PRE-SURGERY Ensure given at PAT  COVID TEST- n/a   Anesthesia review: yes, cardiac hx, Bi V ICD Medtronic. Orders requested  Patient denies shortness of breath, fever, cough and chest pain at PAT appointment   All instructions explained to the patient, with a verbal understanding of the material. Patient agrees to go over the instructions while at home for a better understanding.  The opportunity to ask questions was provided.

## 2022-12-08 NOTE — Progress Notes (Signed)
Anesthesia Chart Review:  Case: [id number] Date/Time: 12/14/22 1230   Procedure: OPEN RIGHT HERNIA REPAIR INGUINAL ADULT WITH MESH (Right)   Anesthesia type: General   Pre-op diagnosis: RIGHT INGUINAL HERNIA   Location: [facility] OR ROOM 02 / [facility]   Surgeons: Ralene Ok, MD       DISCUSSION: Patient is an 82 year old male scheduled for the above procedure.   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   Last cardiology with with Ambrose Pancoast, NP on 11/10/22 for follow-up and preoperative evaluation. He wrote: "Preoperative clearance: -The patient affirms he has been doing well without any new cardiac symptoms. They are able to achieve 4 METS without cardiac limitations. Therefore, based on ACC/AHA guidelines, the patient would be at acceptable risk for the planned procedure without further cardiovascular testing...  Mr. Hartline perioperative risk of a major cardiac event is 6.6% according to the Revised Cardiac Risk Index (RCRI).  Therefore, he is at high risk for perioperative complications.   His functional capacity is fair at 4.31 METs according to the Duke Activity Status Index (DASI). Recommendations: According to ACC/AHA guidelines, no further cardiovascular testing needed.  The patient may proceed to surgery at acceptable risk.   Antiplatelet and/or Anticoagulation Recommendations:   Eliquis (Apixaban) can be held for 2 days prior to surgery.  Please resume post op when felt to be safe."  He reported last dose will be 12/11/2022.   Perioperative ICD EP Recommendations: Device Information: Clinic EP Physician:  Allegra Lai, MD  Device Type:  Pacemaker and Defibrillator Manufacturer and Phone #:  Medtronic: [phone] Pacemaker Dependent?:  Yes.   Date of  Last Device Check:  10/24/22 Normal Device Function?:  Yes.     Electrophysiologist's Recommendations: Have magnet available. Provide continuous ECG monitoring when magnet is used or reprogramming is to be performed.  Procedure will likely interfere with device function.  Device should be programmed:  Tachy therapies disabled and Asynchronous pacing during procedure and returned to normal programming after procedure   Last pulmonology visit 08/21/2022 with Dr. [name].  He had finished pulmonary rehab and planning to start outpatient physical therapy.  He was taking Stiolto inhaler and using albuterol as needed.  76-monthfollow-up planned.  Anesthesia team to evaluate prior to surgery.    VS: BP 135/82   Pulse 85   Temp (!) 36.4 C   Resp 19   Ht 5' 7.5" (1.715 m)   Wt 76.2 kg   SpO2 96%   BMI 25.92 kg/m   PROVIDERS: [name], [name], MD is PCP  CCurt Bears Will, MD is EP cardiologist DLenice Llamas MD is pulmonologist   LABS: Labs reviewed: Acceptable for surgery. Cr 1.46 appears consistent with prior results.  (all labs ordered are listed, but only abnormal results are displayed)  Labs Reviewed  BASIC METABOLIC PANEL - Abnormal; Notable for the following components:      Result Value   BUN 32 (*)    Creatinine, Ser 1.46 (*)    GFR, Estimated 48 (*)    All other components within normal limits  CBC    PFTs 02/14/22: FVC 2.14 (50%), post 2.40 (57%). FEV1 0.91 (30%), post 1.08 (36%), DLCO unc/cor 10.57 (42%).  Severe airflow limitation with FEV1 30% of predicted.    EKG: 11/10/2022: AV dual paced rhythm.  CV: Echo 06/09/22: IMPRESSIONS   1. Left ventricular ejection fraction, by estimation, is 30 to 35%. The  left ventricle has moderately decreased function. The left ventricle  demonstrates global hypokinesis. The left ventricular internal cavity size  was severely dilated. There is  moderate left ventricular hypertrophy. Left ventricular diastolic  parameters are  consistent with Grade II diastolic dysfunction  (pseudonormalization). Elevated left ventricular end-diastolic pressure.  There is akinesis of the left ventricular, entire  inferoseptal wall, inferior wall and anteroseptal wall. There is  dyskinesis of the left ventricular, apical apical segment.   2. Right ventricular systolic function is moderately reduced. The right  ventricular size is normal. There is normal pulmonary artery systolic  pressure.   3. Left atrial size was moderately dilated.   4. Right atrial size was moderately dilated.   5. The mitral valve is grossly normal. Mild mitral valve regurgitation.  No evidence of mitral stenosis.   6. The aortic valve is tricuspid. There is mild calcification of the  aortic valve. There is mild thickening of the aortic valve. Aortic valve  regurgitation is not visualized. Aortic valve sclerosis/calcification is  present, without any evidence of  aortic stenosis.   7. Aortic dilatation noted. There is borderline dilatation of the  ascending aorta, measuring 38 mm.   8. The inferior vena cava is normal in size with greater than 50%  respiratory variability, suggesting right atrial pressure of 3 mmHg.  - Comparison(s): Prior images reviewed side by side. Changes from prior  study are noted. Inferoseptal and inferior walls appear hypokinetic on  review of prior study; more prominent WMA on current study. [07/05/20 echo: LVEF 40-45%, LV global hypokinesis, moderate asymmetric left ventricular hypertrophy of the septal segment, normal RV SF, trivial MR, mild to moderate AV sclerosis without evidence of AAS, ascending aorta 38 mm; LVEF 65% 08/11/11] - Conclusion(s)/Recommendation(s): EF decreased compared to prior study,  with more prominent wall motion abnormalities as noted.    Past Medical History:  Diagnosis Date   Angiodysplasia    Atrial fibrillation (HCC)    CHF (congestive heart failure) (HCC)    COPD (chronic obstructive pulmonary  disease) ([place])    GI bleed    Hypertension    Presence of permanent cardiac pacemaker     Past Surgical History:  Procedure Laterality Date   COLONOSCOPY     ICD GENERATOR CHANGEOUT N/A 07/20/2022   Procedure: ICD GENERATOR CHANGEOUT;  Surgeon: Constance Haw, MD;  Location: [facility];  Service: Cardiovascular;  Laterality: N/A;   PACEMAKER IMPLANT      MEDICATIONS:  albuterol (VENTOLIN HFA) 108 (90 Base) MCG/ACT inhaler   amiodarone (PACERONE) 200 MG tablet   apixaban (ELIQUIS) 5 MG TABS tablet   atorvastatin (LIPITOR) 20 MG tablet   divalproex (DEPAKOTE ER) 500 MG 24 hr tablet   finasteride (PROSCAR) 5 MG tablet   furosemide (LASIX) 20 MG tablet   metoprolol succinate (TOPROL-XL) 50 MG 24 hr tablet   niacin 500 MG tablet   tamsulosin (FLOMAX) 0.4 MG CAPS capsule   Tiotropium Bromide-Olodaterol (STIOLTO RESPIMAT) 2.5-2.5 MCG/ACT AERS   No current facility-administered medications for this encounter.    Myra Gianotti, PA-C Surgical Short Stay/Anesthesiology [facility] Phone [phone] [facility] Phone [phone] 12/08/2022 7:30 PM

## 2022-12-08 NOTE — Anesthesia Preprocedure Evaluation (Signed)
Anesthesia Evaluation  Patient identified by MRN, date of birth, ID band Patient awake    Reviewed: Allergy & Precautions, NPO status , Patient's Chart, lab work & pertinent test results, reviewed documented beta blocker date and time   Airway Mallampati: II  TM Distance: >3 FB Neck ROM: Full    Dental   Pulmonary pneumonia, resolved, COPD,  COPD inhaler, former smoker   Pulmonary exam normal breath sounds clear to auscultation       Cardiovascular hypertension, Pt. on medications and Pt. on home beta blockers +CHF  Normal cardiovascular exam+ dysrhythmias Atrial Fibrillation and Ventricular Tachycardia + pacemaker + Cardiac Defibrillator  Rhythm:Regular Rate:Normal  Hx/o hypertrophic CM  Echo 06/09/22 1. Left ventricular ejection fraction, by estimation, is 30 to 35%. The  left ventricle has moderately decreased function. The left ventricle  demonstrates global hypokinesis. The left ventricular internal cavity size  was severely dilated. There is  moderate left ventricular hypertrophy. Left ventricular diastolic  parameters are consistent with Grade II diastolic dysfunction  (pseudonormalization). Elevated left ventricular end-diastolic pressure.  There is akinesis of the left ventricular, entire  inferoseptal wall, inferior wall and anteroseptal wall. There is  dyskinesis of the left ventricular, apical apical segment.   2. Right ventricular systolic function is moderately reduced. The right  ventricular size is normal. There is normal pulmonary artery systolic  pressure.   3. Left atrial size was moderately dilated.   4. Right atrial size was moderately dilated.   5. The mitral valve is grossly normal. Mild mitral valve regurgitation.  No evidence of mitral stenosis.   6. The aortic valve is tricuspid. There is mild calcification of the  aortic valve. There is mild thickening of the aortic valve. Aortic valve  regurgitation  is not visualized. Aortic valve sclerosis/calcification is  present, without any evidence of  aortic stenosis.   7. Aortic dilatation noted. There is borderline dilatation of the  ascending aorta, measuring 38 mm.   8. The inferior vena cava is normal in size with greater than 50%  respiratory variability, suggesting right atrial pressure of 3 mmHg.   EKG 11/10/22 AV sequential paced rhythm   Neuro/Psych  PSYCHIATRIC DISORDERS   Bipolar Disorder   negative neurological ROS     GI/Hepatic negative GI ROS, Neg liver ROS,,,  Endo/Other  Hyperlipidemia  Renal/GU Renal InsufficiencyRenal disease   BPH    Musculoskeletal negative musculoskeletal ROS (+)  Right Inguinal hernia   Abdominal Normal abdominal exam  (+)   Peds  Hematology Eliquis therapy- last dose 2 days ago   Anesthesia Other Findings   Reproductive/Obstetrics                              Anesthesia Physical Anesthesia Plan  ASA: 3  Anesthesia Plan: General   Post-op Pain Management: Dilaudid IV and Ofirmev IV (intra-op)*   Induction: Intravenous  PONV Risk Score and Plan: 4 or greater and Treatment may vary due to age or medical condition, Ondansetron and Dexamethasone  Airway Management Planned: Oral ETT  Additional Equipment: None  Intra-op Plan:   Post-operative Plan: Extubation in OR  Informed Consent: I have reviewed the patients History and Physical, chart, labs and discussed the procedure including the risks, benefits and alternatives for the proposed anesthesia with the patient or authorized representative who has indicated his/her understanding and acceptance.     Dental advisory given  Plan Discussed with: Anesthesiologist and CRNA  Anesthesia  Plan Comments: (PAT note written 12/08/2022 by Shonna Chock, PA-C.  )        Anesthesia Quick Evaluation

## 2022-12-08 NOTE — Progress Notes (Signed)
[facility]  Patient Information: Name:  Noelan Mesch  DOB:  Oct 26, 1940  MRN:  [id number]    Planned Procedure:  open right inguinal hernia repair with mesh  Surgeon:  Dr. Ralene Ok  Date of Procedure:  12/14/22  Cautery will be used.  Position during surgery:  supine   Please send documentation back to:  [facility] (Fax # [phone])  Device Information:  Clinic EP Physician:  Allegra Lai, MD   Device Type:  Pacemaker and Defibrillator Manufacturer and Phone #:  Medtronic: [phone] Pacemaker Dependent?:  Yes.   Date of Last Device Check:  10/24/22 Normal Device Function?:  Yes.    Electrophysiologist's Recommendations:  Have magnet available. Provide continuous ECG monitoring when magnet is used or reprogramming is to be performed.  Procedure will likely interfere with device function.  Device should be programmed:  Tachy therapies disabled and Asynchronous pacing during procedure and returned to normal programming after procedure  Per [facility] Standing Orders, Wanda Plump, RN  10:35 AM 12/08/2022

## 2022-12-12 NOTE — Progress Notes (Addendum)
Medtronic rep paged and notified that they will be need for pt's procedure on 2/22. Leanna Sato returned call and confirmed that a rep will be here for procedure.

## 2022-12-14 ENCOUNTER — Ambulatory Visit (HOSPITAL_COMMUNITY)
Admission: RE | Admit: 2022-12-14 | Discharge: 2022-12-14 | Disposition: A | Discharge status: Home / Self Care | Payer: Medicare Other | Attending: General Surgery | Admitting: General Surgery

## 2022-12-14 ENCOUNTER — Ambulatory Visit (HOSPITAL_BASED_OUTPATIENT_CLINIC_OR_DEPARTMENT_OTHER): Payer: Medicare Other | Admitting: Anesthesiology

## 2022-12-14 ENCOUNTER — Other Ambulatory Visit: Payer: Self-pay

## 2022-12-14 ENCOUNTER — Encounter (HOSPITAL_COMMUNITY): Payer: Self-pay | Admitting: General Surgery

## 2022-12-14 ENCOUNTER — Ambulatory Visit (HOSPITAL_COMMUNITY): Payer: Medicare Other | Admitting: Vascular Surgery

## 2022-12-14 ENCOUNTER — Encounter (HOSPITAL_COMMUNITY)
Admission: RE | Disposition: A | Discharge status: Home / Self Care | Payer: Self-pay | Source: Home / Self Care | Attending: General Surgery

## 2022-12-14 DIAGNOSIS — K4090 Unilateral inguinal hernia, without obstruction or gangrene, not specified as recurrent: Secondary | ICD-10-CM | POA: Insufficient documentation

## 2022-12-14 DIAGNOSIS — F319 Bipolar disorder, unspecified: Secondary | ICD-10-CM | POA: Insufficient documentation

## 2022-12-14 DIAGNOSIS — Z95810 Presence of automatic (implantable) cardiac defibrillator: Secondary | ICD-10-CM | POA: Insufficient documentation

## 2022-12-14 DIAGNOSIS — I509 Heart failure, unspecified: Secondary | ICD-10-CM | POA: Diagnosis not present

## 2022-12-14 DIAGNOSIS — Z7901 Long term (current) use of anticoagulants: Secondary | ICD-10-CM | POA: Insufficient documentation

## 2022-12-14 DIAGNOSIS — I110 Hypertensive heart disease with heart failure: Secondary | ICD-10-CM

## 2022-12-14 DIAGNOSIS — I083 Combined rheumatic disorders of mitral, aortic and tricuspid valves: Secondary | ICD-10-CM | POA: Diagnosis not present

## 2022-12-14 DIAGNOSIS — Z79899 Other long term (current) drug therapy: Secondary | ICD-10-CM | POA: Insufficient documentation

## 2022-12-14 DIAGNOSIS — J449 Chronic obstructive pulmonary disease, unspecified: Secondary | ICD-10-CM | POA: Diagnosis not present

## 2022-12-14 DIAGNOSIS — I4891 Unspecified atrial fibrillation: Secondary | ICD-10-CM | POA: Insufficient documentation

## 2022-12-14 DIAGNOSIS — I700 Atherosclerosis of aorta: Secondary | ICD-10-CM | POA: Insufficient documentation

## 2022-12-14 DIAGNOSIS — Z87891 Personal history of nicotine dependence: Secondary | ICD-10-CM | POA: Insufficient documentation

## 2022-12-14 HISTORY — PX: INGUINAL HERNIA REPAIR: SHX194

## 2022-12-14 SURGERY — REPAIR, HERNIA, INGUINAL, ADULT
Anesthesia: General | Laterality: Right

## 2022-12-14 MED ORDER — FENTANYL CITRATE (PF) 100 MCG/2ML IJ SOLN
INTRAMUSCULAR | Status: DC | PRN
  Administered 2022-12-14: 100 ug via INTRAVENOUS

## 2022-12-14 MED ORDER — ROCURONIUM BROMIDE 10 MG/ML (PF) SYRINGE
PREFILLED_SYRINGE | INTRAVENOUS | Status: AC
  Filled 2022-12-14: qty 10

## 2022-12-14 MED ORDER — PROPOFOL 10 MG/ML IV BOLUS
INTRAVENOUS | Status: DC | PRN
  Administered 2022-12-14: 130 mg via INTRAVENOUS

## 2022-12-14 MED ORDER — ENSURE PRE-SURGERY PO LIQD: 296.0000 mL | Freq: Once | ORAL | Status: DC

## 2022-12-14 MED ORDER — SUGAMMADEX SODIUM 200 MG/2ML IV SOLN
INTRAVENOUS | Status: DC | PRN
  Administered 2022-12-14: 100 mg via INTRAVENOUS

## 2022-12-14 MED ORDER — 0.9 % SODIUM CHLORIDE (POUR BTL) OPTIME
TOPICAL | Status: DC | PRN
  Administered 2022-12-14: 25 mL

## 2022-12-14 MED ORDER — ROCURONIUM BROMIDE 100 MG/10ML IV SOLN
INTRAVENOUS | Status: DC | PRN
  Administered 2022-12-14: 20 mg via INTRAVENOUS
  Administered 2022-12-14: 10 mg via INTRAVENOUS

## 2022-12-14 MED ORDER — LACTATED RINGERS IV SOLN: INTRAVENOUS | Status: DC | PRN

## 2022-12-14 MED ORDER — ACETAMINOPHEN 500 MG PO TABS
1000.0000 mg | ORAL_TABLET | ORAL | Status: AC
  Administered 2022-12-14: 1000 mg via ORAL
  Filled 2022-12-14: qty 2

## 2022-12-14 MED ORDER — LIDOCAINE 2% (20 MG/ML) 5 ML SYRINGE
INTRAMUSCULAR | Status: AC
  Filled 2022-12-14: qty 5

## 2022-12-14 MED ORDER — CEFAZOLIN SODIUM-DEXTROSE 2-4 GM/100ML-% IV SOLN
2.0000 g | INTRAVENOUS | Status: AC
  Administered 2022-12-14: 2 g via INTRAVENOUS
  Filled 2022-12-14: qty 100

## 2022-12-14 MED ORDER — TRAMADOL HCL 50 MG PO TABS: 50.0000 mg | ORAL_TABLET | Freq: Four times a day (QID) | ORAL | 0 refills | Status: DC | PRN

## 2022-12-14 MED ORDER — FENTANYL CITRATE (PF) 250 MCG/5ML IJ SOLN
INTRAMUSCULAR | Status: AC
  Filled 2022-12-14: qty 5

## 2022-12-14 MED ORDER — CHLORHEXIDINE GLUCONATE 0.12 % MT SOLN
15.0000 mL | Freq: Once | OROMUCOSAL | Status: AC
  Administered 2022-12-14: 15 mL via OROMUCOSAL
  Filled 2022-12-14: qty 15

## 2022-12-14 MED ORDER — ORAL CARE MOUTH RINSE: 15.0000 mL | Freq: Once | OROMUCOSAL | Status: AC

## 2022-12-14 MED ORDER — BUPIVACAINE-EPINEPHRINE 0.25% -1:200000 IJ SOLN
INTRAMUSCULAR | Status: DC | PRN
  Administered 2022-12-14: 10 mL

## 2022-12-14 MED ORDER — CHLORHEXIDINE GLUCONATE CLOTH 2 % EX PADS: 6.0000 | MEDICATED_PAD | Freq: Once | CUTANEOUS | Status: DC

## 2022-12-14 MED ORDER — ONDANSETRON HCL 4 MG/2ML IJ SOLN
INTRAMUSCULAR | Status: DC | PRN
  Administered 2022-12-14: 4 mg via INTRAVENOUS

## 2022-12-14 MED ORDER — LACTATED RINGERS IV SOLN: INTRAVENOUS | Status: DC

## 2022-12-14 MED ORDER — LIDOCAINE 2% (20 MG/ML) 5 ML SYRINGE
INTRAMUSCULAR | Status: DC | PRN
  Administered 2022-12-14: 60 mg via INTRAVENOUS

## 2022-12-14 MED ORDER — DEXAMETHASONE SODIUM PHOSPHATE 10 MG/ML IJ SOLN
INTRAMUSCULAR | Status: DC | PRN
  Administered 2022-12-14: 5 mg via INTRAVENOUS

## 2022-12-14 MED ORDER — PROPOFOL 10 MG/ML IV BOLUS
INTRAVENOUS | Status: AC
  Filled 2022-12-14: qty 20

## 2022-12-14 SURGICAL SUPPLY — 33 items
BLADE CLIPPER SURG (BLADE) IMPLANT
CANISTER SUCT 3000ML PPV (MISCELLANEOUS) IMPLANT
CHLORAPREP W/TINT 26 (MISCELLANEOUS) ×1 IMPLANT
COVER SURGICAL LIGHT HANDLE (MISCELLANEOUS) ×1 IMPLANT
DERMABOND ADVANCED .7 DNX12 (GAUZE/BANDAGES/DRESSINGS) ×1 IMPLANT
DRAIN PENROSE 1/2X12 LTX STRL (WOUND CARE) IMPLANT
ELECT REM PT RETURN 9FT ADLT (ELECTROSURGICAL) ×1
ELECTRODE REM PT RTRN 9FT ADLT (ELECTROSURGICAL) ×1 IMPLANT
GAUZE 4X4 16PLY ~~LOC~~+RFID DBL (SPONGE) ×1 IMPLANT
GLOVE BIO SURGEON STRL SZ7.5 (GLOVE) ×2 IMPLANT
GLOVE BIOGEL PI IND STRL 8 (GLOVE) ×1 IMPLANT
GOWN STRL REUS W/ TWL LRG LVL3 (GOWN DISPOSABLE) ×1 IMPLANT
GOWN STRL REUS W/ TWL XL LVL3 (GOWN DISPOSABLE) ×1 IMPLANT
GOWN STRL REUS W/TWL LRG LVL3 (GOWN DISPOSABLE) ×2
GOWN STRL REUS W/TWL XL LVL3 (GOWN DISPOSABLE) ×2
KIT BASIN OR (CUSTOM PROCEDURE TRAY) ×1 IMPLANT
KIT TURNOVER KIT B (KITS) ×1 IMPLANT
MESH PARIETEX PROGRIP RIGHT (Mesh General) IMPLANT
NDL HYPO 25GX1X1/2 BEV (NEEDLE) ×1 IMPLANT
NEEDLE HYPO 25GX1X1/2 BEV (NEEDLE) ×1 IMPLANT
NS IRRIG 1000ML POUR BTL (IV SOLUTION) ×1 IMPLANT
PACK GENERAL/GYN (CUSTOM PROCEDURE TRAY) ×1 IMPLANT
PAD ARMBOARD 7.5X6 YLW CONV (MISCELLANEOUS) ×2 IMPLANT
SPONGE INTESTINAL PEANUT (DISPOSABLE) IMPLANT
SUT MNCRL AB 4-0 PS2 18 (SUTURE) ×1 IMPLANT
SUT PROLENE 2 0 SH DA (SUTURE) ×1 IMPLANT
SUT VIC AB 2-0 SH 27 (SUTURE) ×2
SUT VIC AB 2-0 SH 27X BRD (SUTURE) ×1 IMPLANT
SUT VIC AB 3-0 SH 27 (SUTURE) ×1
SUT VIC AB 3-0 SH 27XBRD (SUTURE) ×1 IMPLANT
SUT VICRYL 0 UR6 27IN ABS (SUTURE) IMPLANT
SYR CONTROL 10ML LL (SYRINGE) ×1 IMPLANT
TOWEL GREEN STERILE FF (TOWEL DISPOSABLE) ×1 IMPLANT

## 2022-12-14 NOTE — Transfer of Care (Signed)
Immediate Anesthesia Transfer of Care Note  Patient: Mike Gray  Procedure(s) Performed: OPEN RIGHT HERNIA REPAIR INGUINAL ADULT WITH MESH (Right)  Patient Location: PACU  Anesthesia Type:General  Level of Consciousness: awake and patient cooperative  Airway & Oxygen Therapy: Patient Spontanous Breathing  Post-op Assessment: Report given to RN and Post -op Vital signs reviewed and stable  Post vital signs: Reviewed and stable  Last Vitals:  Vitals Value Taken Time  BP 135/78 12/14/22 1226  Temp    Pulse 60 12/14/22 1230  Resp 19 12/14/22 1230  SpO2 93 % 12/14/22 1230  Vitals shown include unvalidated device data.  Last Pain:  Vitals:   12/14/22 1035  TempSrc:   PainSc: 0-No pain         Complications: No notable events documented.

## 2022-12-14 NOTE — Interval H&P Note (Signed)
History and Physical Interval Note:  12/14/2022 11:01 AM  Mike Gray  has presented today for surgery, with the diagnosis of RIGHT INGUINAL HERNIA.  The various methods of treatment have been discussed with the patient and family. After consideration of risks, benefits and other options for treatment, the patient has consented to  Procedure(s): OPEN RIGHT HERNIA REPAIR INGUINAL ADULT WITH MESH (Right) as a surgical intervention.  The patient's history has been reviewed, patient examined, no change in status, stable for surgery.  I have reviewed the patient's chart and labs.  Questions were answered to the patient's satisfaction.     Ralene Ok

## 2022-12-14 NOTE — Anesthesia Postprocedure Evaluation (Addendum)
Anesthesia Post Note  Patient: Mike Gray  Procedure(s) Performed: OPEN RIGHT HERNIA REPAIR INGUINAL ADULT WITH MESH (Right)     Patient location during evaluation: PACU Anesthesia Type: General Level of consciousness: awake and alert and oriented Pain management: pain level controlled Vital Signs Assessment: post-procedure vital signs reviewed and stable Respiratory status: spontaneous breathing, nonlabored ventilation and respiratory function stable Cardiovascular status: blood pressure returned to baseline and stable Postop Assessment: no apparent nausea or vomiting Anesthetic complications: no   No notable events documented.  Last Vitals:  Vitals:   12/14/22 1241 12/14/22 1300  BP: 115/67 (!) 107/92  Pulse: 60 60  Resp: 18 12  Temp:  (!) 36.3 C  SpO2: 92% 95%    Last Pain:  Vitals:   12/14/22 1300  TempSrc:   PainSc: 0-No pain                 Jamar Casagrande A.

## 2022-12-14 NOTE — Anesthesia Procedure Notes (Signed)
Procedure Name: Intubation Date/Time: 12/14/2022 11:29 AM  Performed by: Josephine Igo, MDPre-anesthesia Checklist: Patient identified, Emergency Drugs available, Suction available and Patient being monitored Patient Re-evaluated:Patient Re-evaluated prior to induction Oxygen Delivery Method: Circle System Utilized Preoxygenation: Pre-oxygenation with 100% oxygen Induction Type: IV induction Ventilation: Mask ventilation without difficulty Laryngoscope Size: Mac and 4 Grade View: Grade III Tube type: Oral Tube size: 7.5 mm Number of attempts: 1 Airway Equipment and Method: Stylet and Oral airway Placement Confirmation: ETT inserted through vocal cords under direct vision, positive ETCO2 and breath sounds checked- equal and bilateral Secured at: 22 cm Tube secured with: Tape Dental Injury: Teeth and Oropharynx as per pre-operative assessment

## 2022-12-14 NOTE — Discharge Instructions (Signed)
[facility] _______[facility]  Always review your discharge instruction sheet given to you by the facility where your surgery was performed. IF YOU HAVE DISABILITY OR FAMILY LEAVE FORMS, YOU MUST BRING THEM TO THE OFFICE FOR PROCESSING.   DO NOT GIVE THEM TO YOUR DOCTOR.  1. A  prescription for pain medication may be given to you upon discharge.  Take your pain medication as prescribed, if needed.  If narcotic pain medicine is not needed, then you may take acetaminophen (Tylenol) or ibuprofen (Advil) as needed. 2. Take your usually prescribed medications unless otherwise directed. If you need a refill on your pain medication, please contact your pharmacy.  They will contact our office to request authorization. Prescriptions will not be filled after 5 pm or on week-ends. 3. You should follow a light diet the first 24 hours after arrival home, such as soup and crackers, etc.  Be sure to include lots of fluids daily.  Resume your normal diet the day after surgery. 4.Most patients will experience some swelling and bruising around the umbilicus or in the groin and scrotum.  Ice packs and reclining will help.  Swelling and bruising can take several days to resolve.  6. It is common to experience some constipation if taking pain medication after surgery.  Increasing fluid intake and taking a stool softener (such as Colace) will usually help or prevent this problem from occurring.  A mild laxative (Milk of Magnesia or Miralax) should be taken according to package directions if there are no bowel movements after 48 hours. 7. Unless discharge instructions indicate otherwise, you may remove your bandages 24-48 hours after surgery, and you may shower at that time.  You may have steri-strips (small skin tapes) in place directly over the incision.  These strips should be left on the skin for 7-10 days.  If your surgeon used skin glue on the incision, you may shower in 24 hours.  The glue will flake off over  the next 2-3 weeks.  Any sutures or staples will be removed at the office during your follow-up visit. 8. ACTIVITIES:  You may resume regular (light) daily activities beginning the next day--such as daily self-care, walking, climbing stairs--gradually increasing activities as tolerated.  You may have sexual intercourse when it is comfortable.  Refrain from any heavy lifting or straining until approved by your doctor.  a.You may drive when you are no longer taking prescription pain medication, you can comfortably wear a seatbelt, and you can safely maneuver your car and apply brakes. b.RETURN TO WORK:   _____________________________________________  9.You should see your doctor in the office for a follow-up appointment approximately 2-3 weeks after your surgery.  Make sure that you call for this appointment within a day or two after you arrive home to insure a convenient appointment time. 10.OTHER INSTRUCTIONS: _________________________    _____________________________________  WHEN TO CALL YOUR DOCTOR: Fever over 101.0 Inability to urinate Nausea and/or vomiting Extreme swelling or bruising Continued bleeding from incision. Increased pain, redness, or drainage from the incision  The clinic staff is available to answer your questions during regular business hours.  Please don't hesitate to call and ask to speak to one of the nurses for clinical concerns.  If you have a medical emergency, go to the nearest emergency room or call 911.  A surgeon from [facility] is always on call at the hospital   [address], [place], [place],   [address] ?  [id number],  [place], [place]   [address] [phone] ? [phone] ? FAX [phone] Web site: [email or web address]

## 2022-12-14 NOTE — Op Note (Signed)
12/14/2022  12:06 PM  PATIENT:  Mike Gray  82 y.o. male  PRE-OPERATIVE DIAGNOSIS:  RIGHT INGUINAL HERNIA  POST-OPERATIVE DIAGNOSIS:  RIGHT INDIRECT INGUINAL HERNIA  PROCEDURE:  Procedure(s): OPEN RIGHT HERNIA REPAIR INGUINAL ADULT WITH MESH (Right)  SURGEON:  Surgeon(s) and Role:    Ralene Ok, MD - Primary  ASSISTANTS: Pryor Curia, RNFA   ANESTHESIA:   local and general  EBL:  5 mL   BLOOD ADMINISTERED:none  DRAINS: none   LOCAL MEDICATIONS USED:  BUPIVICAINE   SPECIMEN:  No Specimen  DISPOSITION OF SPECIMEN:  N/A  COUNTS:  YES  TOURNIQUET:  * No tourniquets in log *  DICTATION: .Dragon Dictation Details of the procedure: The patient was taken back to the operating room. The patient was placed in supine position with bilateral SCDs in place. The patient was prepped and draped in the usual sterile fashion.  After appropriate anitbiotics were confirmed, a time-out was confirmed and all facts were verified.  Quarter percent Marcaine was used to infiltrate the area of the incision and an ilioinguinal nerve block was also placed.   A 5 cm incision was made just 1 cm superior to the inguinal ligament. Bovie cautery was used to maintain hemostasis dissection is carried down to the external oblique.  A standard incision was made laterally, and the external oblique was bluntly dissected away from the surrounding tissue with Metzenbaum scissors. The external oblique was elevated in the spermatic cord was bluntly dissected away from the surrounding tissue.  The ilioinguinal nerve was identified and ligated with an 0 dyed vicryl.   The spermatic cord and the hernia were then bluntly dissected away from the pubic tubercle and a Penrose was placed around the hernia sac in the spermatic cord. The vas deferens was identified and protected at all portions of the case. Dissection of the cremasterics took place with Bovie cautery. Once the hernia sac was dissected away from the  surrrounding cremesteric tissue , the hernia sac was entered laterally. There was small bowel at the base, there were no sliding components and no femoral hernia palpated. The hernia sac was highly ligated using 0 Vicryl.  This retracted into the abdomen in the usual fashion.  At this time a right-sided Progrip mesh was then anchored to the pubic tubercle with a 2-0 Prolene.  It was anchored to the shelving edge of the external oblique x 1 and the conjoint tendon cephalad x 1.  The wrap around of the mesh was sutured to the conjoint tendon as well.  The new internal ring did not strangulate the spermatic cord.   The tail was then tucked under the external oblique. At this time the area was irrigated out with sterile saline.    The external oblique was reapproximated using a 2-0 Vicryl in a running fashion. Scarpa's fascia was then reapproximated using a 3-0 Vicryl running fashion. The skin was then reapproximated with 4 Monocryl in a subcuticular fashion. The skin was then dressed with Dermabond.  The patient was taken to the recovery room in stable condition.    PLAN OF CARE: Discharge to home after PACU  PATIENT DISPOSITION:  PACU - hemodynamically stable.   Delay start of Pharmacological VTE agent (>24hrs) due to surgical blood loss or risk of bleeding: not applicable

## 2022-12-15 ENCOUNTER — Encounter (HOSPITAL_COMMUNITY): Payer: Self-pay | Admitting: General Surgery

## 2023-01-15 ENCOUNTER — Ambulatory Visit: Payer: Medicare Other | Attending: Cardiology | Admitting: Cardiology

## 2023-01-15 ENCOUNTER — Encounter: Payer: Self-pay | Admitting: Cardiology

## 2023-01-15 VITALS — BP 130/60 | HR 69 | Ht 67.5 in | Wt 167.0 lb

## 2023-01-15 DIAGNOSIS — I442 Atrioventricular block, complete: Secondary | ICD-10-CM | POA: Insufficient documentation

## 2023-01-15 DIAGNOSIS — I480 Paroxysmal atrial fibrillation: Secondary | ICD-10-CM | POA: Diagnosis not present

## 2023-01-15 DIAGNOSIS — D6869 Other thrombophilia: Secondary | ICD-10-CM | POA: Diagnosis present

## 2023-01-15 DIAGNOSIS — I422 Other hypertrophic cardiomyopathy: Secondary | ICD-10-CM | POA: Insufficient documentation

## 2023-01-15 DIAGNOSIS — Z79899 Other long term (current) drug therapy: Secondary | ICD-10-CM | POA: Diagnosis present

## 2023-01-15 LAB — CUP PACEART INCLINIC DEVICE CHECK
Battery Remaining Longevity: 96 mo
Brady Statistic RA Percent Paced: 98.9 %
Brady Statistic RV Percent Paced: 99.3 %
Date Time Interrogation Session: 20240325165657
HighPow Impedance: 71 Ohm
Implantable Lead Connection Status: 753985
Implantable Lead Connection Status: 753985
Implantable Lead Implant Date: 20070328
Implantable Lead Implant Date: 20150323
Implantable Lead Location: 753859
Implantable Lead Location: 753860
Implantable Lead Model: 5076
Implantable Pulse Generator Implant Date: 20230928
Lead Channel Impedance Value: 399 Ohm
Lead Channel Impedance Value: 475 Ohm
Lead Channel Pacing Threshold Amplitude: 0.5 V
Lead Channel Pacing Threshold Amplitude: 0.75 V
Lead Channel Pacing Threshold Pulse Width: 0.4 ms
Lead Channel Pacing Threshold Pulse Width: 0.4 ms
Lead Channel Sensing Intrinsic Amplitude: 0.9 mV

## 2023-01-15 NOTE — Progress Notes (Signed)
Electrophysiology Office Note   Date:  01/15/2023   ID:  Mike Gray, DOB 07-29-1941, MRN [id number]  PCP:  Mike Amel, MD  Cardiologist:   Primary Electrophysiologist:  Mike Dibenedetto [name], MD    No chief complaint on file.    History of Present Illness: Mike Gray is a 82 y.o. male who is being seen today for the evaluation of ICD/hypertrophic cardiomyopathy at the request of [name], [name], MD. Presenting today for electrophysiology evaluation.    He has a history for atrial fibrillation, CHF, COPD, GI bleed, hypertension.  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  He also has complete heart block.  September 2020 he was hospitalized with heart failure and atrial fibrillation as well as progressive weakness and fatigue.  He was in atrial fibrillation and was loaded on amiodarone.  Today, denies symptoms of palpitations, chest pain, shortness of breath, orthopnea, PND, lower extremity edema, claudication, dizziness, presyncope, syncope, bleeding, or neurologic sequela. The patient is tolerating medications without difficulties.  Since being seen he has done well.  He has no problems or complaints from his generator change.  He is having no chest pain or shortness of breath.  Only short episodes of nonsustained VT.   Past Medical History:  Diagnosis Date   Angiodysplasia    Atrial fibrillation (HCC)    CHF (congestive heart failure) (HCC)    COPD (chronic obstructive pulmonary disease) ([place])    GI bleed    Hypertension    Presence of permanent cardiac pacemaker    Past Surgical History:  Procedure Laterality Date   COLONOSCOPY     ICD GENERATOR CHANGEOUT N/A 07/20/2022   Procedure: ICD GENERATOR CHANGEOUT;  Surgeon: Mike Haw, MD;  Location: [facility];  Service: Cardiovascular;  Laterality: N/A;   INGUINAL HERNIA REPAIR Right 12/14/2022   Procedure: OPEN RIGHT HERNIA REPAIR INGUINAL ADULT  WITH MESH;  Surgeon: Mike Ok, MD;  Location: [facility];  Service: General;  Laterality: Right;   PACEMAKER IMPLANT       Current Outpatient Medications  Medication Sig Dispense Refill   albuterol (VENTOLIN HFA) 108 (90 Base) MCG/ACT inhaler Inhale 2 puffs into the lungs every 6 (six) hours as needed. (Patient taking differently: Inhale 2 puffs into the lungs 2 (two) times daily.) 18 g 5   amiodarone (PACERONE) 200 MG tablet Take 1/2 (one-half) tablet by mouth once daily 45 tablet 3   apixaban (ELIQUIS) 5 MG TABS tablet Take 1 tablet (5 mg total) by mouth 2 (two) times daily. 60 tablet 5   atorvastatin (LIPITOR) 20 MG tablet Take 20 mg by mouth at bedtime.     divalproex (DEPAKOTE ER) 500 MG 24 hr tablet Take 1,000 mg by mouth daily.      finasteride (PROSCAR) 5 MG tablet Take 5 mg by mouth at bedtime.     furosemide (LASIX) 20 MG tablet Take 20 mg by mouth daily as needed for edema.     metoprolol succinate (TOPROL-XL) 50 MG 24 hr tablet Take 1 tablet (50 mg total) by mouth daily. Take with or immediately following a meal. 90 tablet 3   niacin 500 MG tablet Take 500 mg by mouth at bedtime.     tamsulosin (FLOMAX) 0.4 MG CAPS capsule Take 1 capsule (0.4 mg total) by mouth daily after supper. (Patient taking differently: Take 0.8 mg by mouth daily after supper.) 30 capsule 0   Tiotropium Bromide-Olodaterol (STIOLTO RESPIMAT) 2.5-2.5 MCG/ACT AERS INHALE  2 PUFFS BY MOUTH ONCE DAILY 4 g 5   traMADol (ULTRAM) 50 MG tablet Take 1 tablet (50 mg total) by mouth every 6 (six) hours as needed. (Patient not taking: Reported on 01/15/2023) 20 tablet 0   No current facility-administered medications for this visit.    Allergies:   Patient has no known allergies.   Social History:  The patient  reports that he quit smoking about 18 months ago. His smoking use included cigarettes. He has a 40.00 pack-year smoking history. He has never used smokeless tobacco. He reports current alcohol use of about 1.0  standard drink of alcohol per week. He reports that he does not use drugs.   Family History:  The patient's family history includes Breast cancer (age of onset: 74) in his father; Cancer in an other family member; Healthy in his mother.   ROS:  Please see the history of present illness.   Otherwise, review of systems is positive for none.   All other systems are reviewed and negative.   PHYSICAL EXAM: VS:  BP 130/60   Pulse 69   Ht 5' 7.5" (1.715 m)   Wt 167 lb (75.8 kg)   SpO2 99%   BMI 25.77 kg/m  , BMI Body mass index is 25.77 kg/m. GEN: Well nourished, well developed, in no acute distress  HEENT: normal  Neck: no JVD, carotid bruits, or masses Cardiac: RRR; no murmurs, rubs, or gallops,no edema  Respiratory:  clear to auscultation bilaterally, normal work of breathing GI: soft, nontender, nondistended, + BS MS: no deformity or atrophy  Skin: warm and dry, device site well healed Neuro:  Strength and sensation are intact Psych: euthymic mood, full affect  EKG:  EKG is ordered today. Personal review of the ekg ordered shows AV paced  Personal review of the device interrogation today. Results in [place]: 05/24/2022: ALT 10; TSH 1.930 12/07/2022: BUN 32; Creatinine, Ser 1.46; Hemoglobin 13.5; Platelets 190; Potassium 4.4; Sodium 139    Lipid Panel     Component Value Date/Time   CHOL 87 07/29/2021 0354   TRIG 63 07/29/2021 0354   HDL 34 (L) 07/29/2021 0354   CHOLHDL 2.6 07/29/2021 0354   VLDL 13 07/29/2021 0354   LDLCALC 40 07/29/2021 0354     Wt Readings from Last 3 Encounters:  01/15/23 167 lb (75.8 kg)  12/14/22 165 lb (74.8 kg)  12/07/22 168 lb (76.2 kg)      Other studies Reviewed: Additional studies/ records that were reviewed today include: TTE 06/10/22  1. Left ventricular ejection fraction, by estimation, is 30 to 35%. The  left ventricle has moderately decreased function. The left ventricle  demonstrates global hypokinesis. The left  ventricular internal cavity size  was severely dilated. There is  moderate left ventricular hypertrophy. Left ventricular diastolic  parameters are consistent with Grade II diastolic dysfunction  (pseudonormalization). Elevated left ventricular end-diastolic pressure.  There is akinesis of the left ventricular, entire  inferoseptal wall, inferior wall and anteroseptal wall. There is  dyskinesis of the left ventricular, apical apical segment.   2. Right ventricular systolic function is moderately reduced. The right  ventricular size is normal. There is normal pulmonary artery systolic  pressure.   3. Left atrial size was moderately dilated.   4. Right atrial size was moderately dilated.   5. The mitral valve is grossly normal. Mild mitral valve regurgitation.  No evidence of mitral stenosis.   6. The aortic valve is tricuspid. There is  mild calcification of the  aortic valve. There is mild thickening of the aortic valve. Aortic valve  regurgitation is not visualized. Aortic valve sclerosis/calcification is  present, without any evidence of  aortic stenosis.   7. Aortic dilatation noted. There is borderline dilatation of the  ascending aorta, measuring 38 mm.   8. The inferior vena cava is normal in size with greater than 50%  respiratory variability, suggesting right atrial pressure of 3 mmHg.    ASSESSMENT AND PLAN:  1.  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  Device functioning appropriately.  Currently on Toprol-XL.  Impedance, threshold, sensing within normal limits.  No changes.  2.  Paroxysmal atrial fibrillation: Currently on Eliquis.  CHA2DS2-VASc of 3.  On amiodarone.  Labs as noted.  No changes.  3.  Hypertension: Currently well-controlled  4.  Nonsustained VT: Minimal on interrogation.  No changes.  5.  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  Device functioning appropriately.   Current medicines are reviewed  at length with the patient today.   The patient does not have concerns regarding his medicines.  The following changes were made today: None  Labs/ tests ordered today include:  Orders Placed This Encounter  Procedures   TSH   Hepatic function panel   EKG 12-Lead    Disposition:   FU 12 months  Signed, Lashonne Shull [name], MD  01/15/2023 2:15 PM     [facility] [address] [place] [address] [phone] (office) [phone] (fax)

## 2023-01-15 NOTE — Patient Instructions (Signed)
Medication Instructions:  Your physician recommends that you continue on your current medications as directed. Please refer to the Current Medication list given to you today.  *If you need a refill on your cardiac medications before your next appointment, please call your pharmacy*   Lab Work: Amiodarone surveillance labs today: TSH & LFTs  If you have labs (blood work) drawn today and your tests are completely normal, you will receive your results only by: [place] (if you have MyChart) OR A paper copy in the mail If you have any lab test that is abnormal or we need to change your treatment, we will call you to review the results.   Testing/Procedures: None ordered   Follow-Up: At [facility], you and your health needs are our priority.  As part of our continuing mission to provide you with exceptional heart care, we have created designated Provider Care Teams.  These Care Teams include your primary Cardiologist (physician) and Advanced Practice Providers (APPs -  Physician Assistants and Nurse Practitioners) who all work together to provide you with the care you need, when you need it.  Remote monitoring is used to monitor your Pacemaker or ICD from home. This monitoring reduces the number of office visits required to check your device to one time per year. It allows [place] to keep an eye on the functioning of your device to ensure it is working properly. You are scheduled for a device check from home on 04/20/2023. You may send your transmission at any time that day. If you have a wireless device, the transmission will be sent automatically. After your physician reviews your transmission, you will receive a postcard with your next transmission date.  Your next appointment:   1 year(s)  The format for your next appointment:   In Person  Provider:   Allegra Lai, MD    Thank you for choosing [facility]!!   Trinidad Curet, RN [phone]

## 2023-01-16 LAB — HEPATIC FUNCTION PANEL
ALT: 14 IU/L (ref 0–44)
AST: 26 IU/L (ref 0–40)
Albumin: 4.7 g/dL (ref 3.7–4.7)
Alkaline Phosphatase: 73 IU/L (ref 44–121)
Bilirubin Total: 0.4 mg/dL (ref 0.0–1.2)
Bilirubin, Direct: 0.15 mg/dL (ref 0.00–0.40)
Total Protein: 7.4 g/dL (ref 6.0–8.5)

## 2023-01-16 LAB — TSH: TSH: 1.91 u[IU]/mL (ref 0.450–4.500)

## 2023-01-19 ENCOUNTER — Ambulatory Visit: Payer: Medicare Other

## 2023-01-19 DIAGNOSIS — I422 Other hypertrophic cardiomyopathy: Secondary | ICD-10-CM

## 2023-01-19 LAB — CUP PACEART REMOTE DEVICE CHECK
Battery Remaining Longevity: 95 mo
Battery Voltage: 3.03 V
Brady Statistic AP VP Percent: 99.15 %
Brady Statistic AP VS Percent: 0.12 %
Brady Statistic AS VP Percent: 0.72 %
Brady Statistic AS VS Percent: 0 %
Brady Statistic RA Percent Paced: 99.22 %
Brady Statistic RV Percent Paced: 99.58 %
Date Time Interrogation Session: 20240329033324
HighPow Impedance: 71 Ohm
Implantable Lead Connection Status: 753985
Implantable Lead Connection Status: 753985
Implantable Lead Implant Date: 20070328
Implantable Lead Implant Date: 20150323
Implantable Lead Location: 753859
Implantable Lead Location: 753860
Implantable Lead Model: 5076
Implantable Pulse Generator Implant Date: 20230928
Lead Channel Impedance Value: 285 Ohm
Lead Channel Impedance Value: 342 Ohm
Lead Channel Impedance Value: 475 Ohm
Lead Channel Pacing Threshold Amplitude: 0.625 V
Lead Channel Pacing Threshold Amplitude: 0.75 V
Lead Channel Pacing Threshold Pulse Width: 0.4 ms
Lead Channel Pacing Threshold Pulse Width: 0.4 ms
Lead Channel Sensing Intrinsic Amplitude: 0.875 mV
Lead Channel Sensing Intrinsic Amplitude: 1.625 mV
Lead Channel Sensing Intrinsic Amplitude: 5.5 mV
Lead Channel Sensing Intrinsic Amplitude: 5.5 mV
Lead Channel Setting Pacing Amplitude: 1.5 V
Lead Channel Setting Pacing Amplitude: 2 V
Lead Channel Setting Pacing Pulse Width: 0.4 ms
Lead Channel Setting Sensing Sensitivity: 0.3 mV
Zone Setting Status: 755011
Zone Setting Status: 755011

## 2023-01-30 MED ORDER — METOPROLOL SUCCINATE ER 100 MG PO TB24: 100.0000 mg | ORAL_TABLET | Freq: Every day | ORAL | 2 refills | Status: DC

## 2023-02-12 ENCOUNTER — Telehealth: Payer: Self-pay | Admitting: Internal Medicine

## 2023-02-12 NOTE — Telephone Encounter (Signed)
ATC LVMTCB for pt's brother.  x1 pt needs to be scheduled for OV with ND to get quailifed for O2. Please schedule pt if call is retunred.

## 2023-02-12 NOTE — Telephone Encounter (Signed)
Molly Maduro brother states patient would like order for portable oxygen concentrator. Patient having shortness of breath. Molly Maduro phone number is [phone].

## 2023-02-12 NOTE — Telephone Encounter (Signed)
Pt scheduled for OV on 02/13/23 with Dr. Celine Mans. Nothing further needed at this time.

## 2023-02-13 ENCOUNTER — Encounter: Payer: Self-pay | Admitting: Internal Medicine

## 2023-02-13 ENCOUNTER — Ambulatory Visit (INDEPENDENT_AMBULATORY_CARE_PROVIDER_SITE_OTHER): Payer: Medicare Other | Admitting: Internal Medicine

## 2023-02-13 VITALS — BP 130/74 | HR 69 | Temp 97.8°F | Ht 68.0 in | Wt 170.4 lb

## 2023-02-13 DIAGNOSIS — J439 Emphysema, unspecified: Secondary | ICD-10-CM

## 2023-02-13 DIAGNOSIS — J4489 Other specified chronic obstructive pulmonary disease: Secondary | ICD-10-CM | POA: Diagnosis not present

## 2023-02-13 MED ORDER — TRELEGY ELLIPTA 100-62.5-25 MCG/ACT IN AEPB: 1.0000 | INHALATION_SPRAY | Freq: Every day | RESPIRATORY_TRACT | 0 refills | Status: DC

## 2023-02-13 MED ORDER — TRELEGY ELLIPTA 100-62.5-25 MCG/ACT IN AEPB: 1.0000 | INHALATION_SPRAY | Freq: Every day | RESPIRATORY_TRACT | 11 refills | Status: DC

## 2023-02-13 NOTE — Patient Instructions (Addendum)
Please schedule follow up scheduled with myself in 4 months.  If my schedule is not open yet, we will contact you with a reminder closer to that time. Please call [phone] if you haven't heard from [place] a month before.   Stop stiolto inhaler. Switch to trelegy inhaler 1 puff once a day. Gargle after use.   Continue albuterol inhaler as needed.   Continue staying active to help your breathing.

## 2023-02-13 NOTE — Addendum Note (Signed)
Addended by: Dorisann Frames R on: 02/13/2023 10:10 AM   Modules accepted: Orders

## 2023-02-13 NOTE — Progress Notes (Signed)
Sarvesh Meddaugh    [id number]    January 29, 1941  Primary Care Physician:[name], [name], MD Date of Appointment: 02/13/2023 Established Patient Visit  Chief complaint:   Chief Complaint  Patient presents with   Follow-up    SOB with exertion x 1 week      HPI: Mike Gray is a 82 y.o. 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   Interval Updates: Here for COPD follow up.  Worsening shortness of breath for the past 1-2 weeks. Feels he is having difficulty breathing and that is what is waking him up.  Going to physical therapy twice a week. Not doing any exercise during the week. Spends most of his time in a wheelchair.  He is having constipation.   Still on stiolto with prn albuterol although now taking albuterol twice a day.   I have reviewed the patient's family social and past medical history and updated as appropriate.   Past Medical History:  Diagnosis Date   Angiodysplasia    Atrial fibrillation    CHF (congestive heart failure)    COPD (chronic obstructive pulmonary disease)    GI bleed    Hypertension    Presence of permanent cardiac pacemaker     Past Surgical History:  Procedure Laterality Date   COLONOSCOPY     ICD GENERATOR CHANGEOUT N/A 07/20/2022   Procedure: ICD GENERATOR CHANGEOUT;  Surgeon: Regan Lemming, MD;  Location: [facility];  Service: Cardiovascular;  Laterality: N/A;   INGUINAL HERNIA REPAIR Right 12/14/2022   Procedure: OPEN RIGHT HERNIA REPAIR INGUINAL ADULT WITH MESH;  Surgeon: Axel Filler, MD;  Location: [facility];  Service: General;  Laterality: Right;   PACEMAKER IMPLANT      Family History  Problem Relation Age of Onset   Healthy Mother    Breast cancer Father 7   Cancer Other     Social History   Occupational History   Not on file  Tobacco Use   Smoking status: Former    Packs/day: 1.00    Years: 40.00    Additional pack years: 0.00    Total pack years: 40.00    Types: Cigarettes    Quit date:  06/2021    Years since quitting: 1.6   Smokeless tobacco: Never  Vaping Use   Vaping Use: Never used  Substance and Sexual Activity   Alcohol use: Yes    Alcohol/week: 1.0 standard drink of alcohol    Types: 1 Cans of beer per week   Drug use: Never   Sexual activity: Not on file     Physical Exam: Blood pressure 130/74, pulse 69, temperature 97.8 F (36.6 C), temperature source Oral, height  (1.727 m), weight 170 lb 6.4 oz (77.3 kg), SpO2 95 %.  Gen:     No distress, chronically ill appearing, elderly Lungs:    dimnished, no wheezes or crackles CV:        RRR no mrg   Data Reviewed: Imaging:  PFTs:     Latest Ref Rng & Units 02/14/2022    1:41 PM  PFT Results  FVC-Pre L 2.14   FVC-Predicted Pre % 50   FVC-Post L 2.40   FVC-Predicted Post % 57   Pre FEV1/FVC % % 43   Post FEV1/FCV % % 45   FEV1-Pre L 0.91   FEV1-Predicted Pre % 30   FEV1-Post L 1.08   DLCO uncorrected ml/min/mmHg 10.57   DLCO UNC% %  42   DLCO corrected ml/min/mmHg 10.57   DLCO COR %Predicted % 42   DLVA Predicted % 60    I have personally reviewed the patient's PFTs and severe airflow limitation FEV 1 30% of predicted  Labs: Lab Results  Component Value Date   WBC 5.5 12/07/2022   HGB 13.5 12/07/2022   HCT 41.5 12/07/2022   MCV 90.2 12/07/2022   PLT 190 12/07/2022   Lab Results  Component Value Date   NA 139 12/07/2022   K 4.4 12/07/2022   CL 102 12/07/2022   CO2 29 12/07/2022    Immunization status: Immunization History  Administered Date(s) Administered   Fluad Quad(high Dose 65+) 06/27/2019   Influenza-Unspecified 07/21/2022   PFIZER(Purple Top)SARS-COV-2 Vaccination 11/29/2019, 12/24/2019   Pneumococcal Conjugate-13 06/27/2019    External Records Personally Reviewed:   Assessment:  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  Plan/Recommendations: Ambulatory desat study today - did not drop below 88%.   Stop stiolto inhaler. Switch to trelegy inhaler 1 puff once a day. Gargle after use.   Continue albuterol inhaler as needed.   Continue staying active to help your breathing.   MOST form previously completed - he is DNR and DNI.   Return to Care: Return in about 4 months (around 06/15/2023).   Durel Salts, MD Pulmonary and Critical Care Medicine [facility] [phone]

## 2023-02-20 ENCOUNTER — Other Ambulatory Visit: Payer: Self-pay | Admitting: Internal Medicine

## 2023-02-23 ENCOUNTER — Other Ambulatory Visit: Payer: Self-pay | Admitting: Cardiology

## 2023-02-27 ENCOUNTER — Ambulatory Visit (INDEPENDENT_AMBULATORY_CARE_PROVIDER_SITE_OTHER): Payer: Medicare Other | Admitting: Podiatry

## 2023-02-27 DIAGNOSIS — M79674 Pain in right toe(s): Secondary | ICD-10-CM | POA: Diagnosis not present

## 2023-02-27 DIAGNOSIS — B351 Tinea unguium: Secondary | ICD-10-CM

## 2023-02-27 DIAGNOSIS — M79675 Pain in left toe(s): Secondary | ICD-10-CM

## 2023-03-01 ENCOUNTER — Encounter: Payer: Self-pay | Admitting: Podiatry

## 2023-03-01 NOTE — Progress Notes (Signed)
  Subjective:  Patient ID: Mike Gray, male    DOB: 16-Nov-1940,  MRN: [id number]  Mike Gray presents to clinic today for painful thick toenails that are difficult to trim. Pain interferes with ambulation. Aggravating factors include wearing enclosed shoe gear. Pain is relieved with periodic professional debridement.  Chief Complaint  Patient presents with   Nail Problem    RFC PCP-[name] PCP VST-do not know   New problem(s): None.   PCP is [name], [name], MD.  No Known Allergies  Review of Systems: Negative except as noted in the HPI.  Objective:  There were no vitals filed for this visit. Mike Gray is a pleasant 82 y.o. male WD, WN in NAD. AAO x 3.  Vascular Examination: Vascular status intact b/l with palpable pedal pulses. CFT immediate b/l. No edema. No pain with calf compression b/l. Skin temperature gradient WNL b/l. Pedal hair sparse.  Neurological Examination: Sensation grossly intact b/l with 10 gram monofilament. Vibratory sensation intact b/l.   Dermatological Examination: Pedal skin with normal turgor, texture and tone b/l. Toenails 1-5 b/l thick, discolored, elongated with subungual debris and pain on dorsal palpation. No hyperkeratotic lesions noted b/l.   There is evidence of subacute subungual hematoma of the R 3rd toe and R 4th toe. Nailplate remains adhered. There is no  tenderness to palpation.  Pincer nail deformity bilateral great toes. No erythema, no edema, no drainage, no fluctuance. Nail border hypertrophy absent b/l great toes.  Sign(s) of infection: no clinical signs of infection noted on examination today.  Musculoskeletal Examination: Muscle strength 5/5 to b/l LE. No pain, crepitus or joint limitation noted with ROM bilateral LE. No gross bony deformities bilaterally.  Radiographs: None  Assessment/Plan: 1. Pain due to onychomycosis of toenails of both feet     -Patient was evaluated and treated. All patient's and/or POA's  questions/concerns answered on today's visit. -Patient to continue soft, supportive shoe gear daily. -Mycotic toenails 1-5 bilaterally were debrided in length and girth with sterile nail nippers and dremel without incident. -Patient/POA to call should there be question/concern in the interim.   Return in about 3 months (around 05/30/2023).  Freddie Breech, DPM

## 2023-04-19 ENCOUNTER — Other Ambulatory Visit: Payer: Self-pay | Admitting: Internal Medicine

## 2023-04-20 ENCOUNTER — Ambulatory Visit (INDEPENDENT_AMBULATORY_CARE_PROVIDER_SITE_OTHER): Payer: Medicare Other

## 2023-04-20 DIAGNOSIS — I442 Atrioventricular block, complete: Secondary | ICD-10-CM | POA: Diagnosis not present

## 2023-04-20 LAB — CUP PACEART REMOTE DEVICE CHECK
Battery Remaining Longevity: 94 mo
Battery Voltage: 3.03 V
Brady Statistic AP VP Percent: 99.49 %
Brady Statistic AP VS Percent: 0.13 %
Brady Statistic AS VP Percent: 0.38 %
Brady Statistic AS VS Percent: 0 %
Brady Statistic RA Percent Paced: 99.58 %
Brady Statistic RV Percent Paced: 99.55 %
Date Time Interrogation Session: 20240628001804
HighPow Impedance: 74 Ohm
Implantable Lead Connection Status: 753985
Implantable Lead Connection Status: 753985
Implantable Lead Implant Date: 20070328
Implantable Lead Implant Date: 20150323
Implantable Lead Location: 753859
Implantable Lead Location: 753860
Implantable Lead Model: 5076
Implantable Pulse Generator Implant Date: 20230928
Lead Channel Impedance Value: 304 Ohm
Lead Channel Impedance Value: 399 Ohm
Lead Channel Impedance Value: 475 Ohm
Lead Channel Pacing Threshold Amplitude: 0.625 V
Lead Channel Pacing Threshold Amplitude: 0.75 V
Lead Channel Pacing Threshold Pulse Width: 0.4 ms
Lead Channel Pacing Threshold Pulse Width: 0.4 ms
Lead Channel Sensing Intrinsic Amplitude: 1.875 mV
Lead Channel Sensing Intrinsic Amplitude: 1.875 mV
Lead Channel Sensing Intrinsic Amplitude: 8.25 mV
Lead Channel Sensing Intrinsic Amplitude: 8.25 mV
Lead Channel Setting Pacing Amplitude: 1.5 V
Lead Channel Setting Pacing Amplitude: 2 V
Lead Channel Setting Pacing Pulse Width: 0.4 ms
Lead Channel Setting Sensing Sensitivity: 0.3 mV
Zone Setting Status: 755011
Zone Setting Status: 755011

## 2023-05-03 NOTE — Progress Notes (Signed)
Remote ICD transmission.   

## 2023-05-07 ENCOUNTER — Other Ambulatory Visit: Payer: Self-pay | Admitting: Cardiology

## 2023-05-08 MED ORDER — METOPROLOL SUCCINATE ER 50 MG PO TB24: 50.0000 mg | ORAL_TABLET | Freq: Every day | ORAL | 2 refills | Status: DC

## 2023-05-08 NOTE — Telephone Encounter (Signed)
Pt's pharmacy is requesting a refill on metoprolol 100 mg tablets. Pt has metoprolol 50 mg tablets on the medication list as well. Does pt supposed to be taking both strengths? Please clarify which medication is pt supposed to be taking.

## 2023-05-14 ENCOUNTER — Other Ambulatory Visit: Payer: Self-pay | Admitting: Internal Medicine

## 2023-06-06 ENCOUNTER — Ambulatory Visit: Payer: Medicare Other | Admitting: Podiatry

## 2023-06-07 ENCOUNTER — Other Ambulatory Visit: Payer: Self-pay | Admitting: Internal Medicine

## 2023-06-11 ENCOUNTER — Encounter: Payer: Self-pay | Admitting: Podiatry

## 2023-06-11 ENCOUNTER — Ambulatory Visit (INDEPENDENT_AMBULATORY_CARE_PROVIDER_SITE_OTHER): Payer: Medicare Other | Admitting: Podiatry

## 2023-06-11 DIAGNOSIS — M79675 Pain in left toe(s): Secondary | ICD-10-CM | POA: Diagnosis not present

## 2023-06-11 DIAGNOSIS — B351 Tinea unguium: Secondary | ICD-10-CM

## 2023-06-11 DIAGNOSIS — M79674 Pain in right toe(s): Secondary | ICD-10-CM

## 2023-06-11 NOTE — Progress Notes (Signed)
  Subjective:  Patient ID: Mike Gray, male    DOB: [birth year],   MRN: [id number]  Chief Complaint  Patient presents with   Nail Problem    Pt present today for A routine foot care.pt denies pain    82 y.o. male presents for concern of thickened elongated and painful nails that are difficult to trim. Requesting to have them trimmed today.   PCP:  Darrow Bussing, MD    . Denies any other pedal complaints. Denies n/v/f/c.   Past Medical History:  Diagnosis Date   Angiodysplasia    Atrial fibrillation (HCC)    CHF (congestive heart failure) (HCC)    COPD (chronic obstructive pulmonary disease) (HCC)    GI bleed    Hypertension    Presence of permanent cardiac pacemaker     Objective:  Physical Exam: Vascular: DP/PT pulses 2/4 bilateral. CFT <3 seconds. Absent hair growth on digits. Edema noted to bilateral lower extremities. Xerosis noted bilaterally.  Skin. No lacerations or abrasions bilateral feet. Nails 1-5 bilateral  are thickened discolored and elongated with subungual debris.  Musculoskeletal: MMT 5/5 bilateral lower extremities in DF, PF, Inversion and Eversion. Deceased ROM in DF of ankle joint.  Neurological: Sensation intact to light touch. Protective sensation intact bilateral.    Assessment:   1. Pain due to onychomycosis of toenails of both feet      Plan:  Patient was evaluated and treated and all questions answered. -Mechanically debrided all nails 1-5 bilateral using sterile nail nipper and filed with dremel without incident  -Answered all patient questions -Patient to return  in 3 months for at risk foot care -Patient advised to call the office if any problems or questions arise in the meantime.   Louann Sjogren, DPM

## 2023-07-20 ENCOUNTER — Ambulatory Visit: Payer: Medicare Other

## 2023-07-20 DIAGNOSIS — I442 Atrioventricular block, complete: Secondary | ICD-10-CM

## 2023-07-21 LAB — CUP PACEART REMOTE DEVICE CHECK
Battery Remaining Longevity: 89 mo
Battery Voltage: 3.02 V
Brady Statistic AP VP Percent: 99.48 %
Brady Statistic AP VS Percent: 0.14 %
Brady Statistic AS VP Percent: 0.38 %
Brady Statistic AS VS Percent: 0 %
Brady Statistic RA Percent Paced: 99.57 %
Brady Statistic RV Percent Paced: 99.26 %
Date Time Interrogation Session: 20240927033422
HighPow Impedance: 67 Ohm
Implantable Lead Connection Status: 753985
Implantable Lead Connection Status: 753985
Implantable Lead Implant Date: 20070328
Implantable Lead Implant Date: 20150323
Implantable Lead Location: 753859
Implantable Lead Location: 753860
Implantable Lead Model: 5076
Implantable Pulse Generator Implant Date: 20230928
Lead Channel Impedance Value: 304 Ohm
Lead Channel Impedance Value: 361 Ohm
Lead Channel Impedance Value: 456 Ohm
Lead Channel Pacing Threshold Amplitude: 0.625 V
Lead Channel Pacing Threshold Amplitude: 0.75 V
Lead Channel Pacing Threshold Pulse Width: 0.4 ms
Lead Channel Pacing Threshold Pulse Width: 0.4 ms
Lead Channel Sensing Intrinsic Amplitude: 0.625 mV
Lead Channel Sensing Intrinsic Amplitude: 0.625 mV
Lead Channel Sensing Intrinsic Amplitude: 9.75 mV
Lead Channel Sensing Intrinsic Amplitude: 9.75 mV
Lead Channel Setting Pacing Amplitude: 1.5 V
Lead Channel Setting Pacing Amplitude: 2 V
Lead Channel Setting Pacing Pulse Width: 0.4 ms
Lead Channel Setting Sensing Sensitivity: 0.3 mV
Zone Setting Status: 755011
Zone Setting Status: 755011

## 2023-07-26 NOTE — Progress Notes (Signed)
Remote ICD transmission.   

## 2023-08-06 ENCOUNTER — Other Ambulatory Visit: Payer: Self-pay | Admitting: Cardiology

## 2023-08-09 ENCOUNTER — Encounter: Payer: Self-pay | Admitting: Internal Medicine

## 2023-08-09 ENCOUNTER — Ambulatory Visit: Payer: Medicare Other | Admitting: Internal Medicine

## 2023-08-09 VITALS — BP 138/80 | HR 83 | Ht 67.0 in | Wt 178.4 lb

## 2023-08-09 DIAGNOSIS — J439 Emphysema, unspecified: Secondary | ICD-10-CM

## 2023-08-09 DIAGNOSIS — Z23 Encounter for immunization: Secondary | ICD-10-CM | POA: Diagnosis not present

## 2023-08-09 DIAGNOSIS — R0602 Shortness of breath: Secondary | ICD-10-CM

## 2023-08-09 DIAGNOSIS — J4489 Other specified chronic obstructive pulmonary disease: Secondary | ICD-10-CM | POA: Diagnosis not present

## 2023-08-09 NOTE — Progress Notes (Signed)
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 Pt states SOB is getting worse especially in the mornings.     HPI: Mike Gray is a 82 y.o. 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 On trelegy. S/p pulmonary rehab.   Interval Updates: Here for COPD follow up. Having shortness of breath when waking up. Having trouble falling asleep at night and taking tylenol pm.   Feels better on trelegy than stiolto. Going to physical therapy - working on a recumbent bike. Brother notes dyspnea with minimal exertion around the house, feels like he is getting worse.   I have reviewed the patient's family social and past medical history and updated as appropriate.   Past Medical History:  Diagnosis Date   Angiodysplasia    Atrial fibrillation (HCC)    CHF (congestive heart failure) (HCC)    COPD (chronic obstructive pulmonary disease) (HCC)    GI bleed    Hypertension    Presence of permanent cardiac pacemaker     Past Surgical History:  Procedure Laterality Date   COLONOSCOPY     ICD GENERATOR CHANGEOUT N/A 07/20/2022   Procedure: ICD GENERATOR CHANGEOUT;  Surgeon: Regan Lemming, MD;  Location: [facility] INVASIVE CV LAB;  Service: Cardiovascular;  Laterality: N/A;   INGUINAL HERNIA REPAIR Right 12/14/2022   Procedure: OPEN RIGHT HERNIA REPAIR INGUINAL ADULT WITH MESH;  Surgeon: Axel Filler, MD;  Location: [facility] OR;  Service: General;  Laterality: Right;   PACEMAKER IMPLANT      Family History  Problem Relation Age of Onset   Healthy Mother    Breast cancer Father 73   Cancer Other     Social History   Occupational History   Not on file  Tobacco Use   Smoking status: Former    Current packs/day: 0.00    Average packs/day:  1 pack/day for 40.0 years (40.0 ttl pk-yrs)    Types: Cigarettes    Start date: 06/1981    Quit date: 06/2021    Years since quitting: 2.1   Smokeless tobacco: Never  Vaping Use   Vaping status: Never Used  Substance and Sexual Activity   Alcohol use: Yes    Alcohol/week: 1.0 standard drink of alcohol    Types: 1 Cans of beer per week   Drug use: Never   Sexual activity: Not on file     Physical Exam: Blood pressure 138/80, pulse 83, height 5\' 7"  (1.702 m), weight 178 lb 6.4 oz (80.9 kg), SpO2 94%.  Gen:     Elderly, fatigued, no distress Lungs:    dimnished, clear, no wheezes CV:        RRR, no edema   Data Reviewed: Imaging:  PFTs:     Latest Ref Rng & Units 02/14/2022    1:41 PM  PFT Results  FVC-Pre L 2.14   FVC-Predicted Pre % 50   FVC-Post L 2.40   FVC-Predicted Post % 57   Pre FEV1/FVC % % 43   Post FEV1/FCV % % 45   FEV1-Pre L 0.91   FEV1-Predicted Pre % 30   FEV1-Post L 1.08   DLCO uncorrected ml/min/mmHg 10.57   DLCO UNC% % 42   DLCO corrected  ml/min/mmHg 10.57   DLCO COR %Predicted % 42   DLVA Predicted % 60    I have personally reviewed the patient's PFTs and severe airflow limitation FEV 1 30% of predicted  Labs: Lab Results  Component Value Date   WBC 5.5 12/07/2022   HGB 13.5 12/07/2022   HCT 41.5 12/07/2022   MCV 90.2 12/07/2022   PLT 190 12/07/2022   Lab Results  Component Value Date   NA 139 12/07/2022   K 4.4 12/07/2022   CL 102 12/07/2022   CO2 29 12/07/2022    Immunization status: Immunization History  Administered Date(s) Administered   Fluad Quad(high Dose 65+) 06/27/2019   Influenza-Unspecified 07/21/2022   PFIZER(Purple Top)SARS-COV-2 Vaccination 11/29/2019, 12/24/2019   Pneumococcal Conjugate-13 06/27/2019    External Records Personally Reviewed:   Assessment:  Gold 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 Need for influenza vaccination  Overnight oximetry study - to measure your oxygen levels when you sleep. If this is low we can order oxygen at night. Can consider bipap machine although he is unsure if he would be willing to sleep with this.   You can measure your oxygen levels while exercising with physical therapy - give me a call or send a message if dropping consistently below 89%.   Continue trelegy 1 puff once daily, gargle after use.  Continue albuterol inhaler as needed.   Continue to stay active  Flu shot today.   MOST form previously completed - he is DNR and DNI.   Return to Care: Return in about 6 months (around 02/07/2024).   Durel Salts, MD Pulmonary and Critical Care Medicine [facility] Office:[phone]

## 2023-08-09 NOTE — Patient Instructions (Signed)
It was a pleasure to see you today!  Please schedule follow up scheduled with myself in 6 months.  If my schedule is not open yet, we will contact you with a reminder closer to that time. Please call [phone] if you haven't heard from [place] a month before, and always call us sooner if issues or concerns arise. You can also send [place] a message through MyChart, but but aware that this is not to be used for urgent issues and it may take up to 5-7 days to receive a reply. Please be aware that you will likely be able to view your results before I have a chance to respond to them. Please give [place] 5 business days to respond to any non-urgent results.    Before your next visit I would like you to have: Overnight oximetry study - to measure your oxygen levels when you sleep. If this is low we can order oxygen at night.  You can measure your oxygen levels while exercising with physical therapy - give me a call or send a message if dropping consistently below 89%.   Continue trelegy 1 puff once daily, gargle after use.  Continue albuterol inhaler as needed.   Continue to stay active  Flu shot today.

## 2023-09-01 ENCOUNTER — Other Ambulatory Visit: Payer: Self-pay | Admitting: Internal Medicine

## 2023-09-04 ENCOUNTER — Telehealth: Payer: Self-pay | Admitting: Cardiology

## 2023-09-04 NOTE — Telephone Encounter (Signed)
I spoke with  patient's brother.  He reports he usually hears from our office if patient needs to take lasix based on his fluid levels and he is checking to see if patient needs lasix.  Brother reports when patient took off his socks today there was a slight indentation around his ankles.  Weight gain of 2 lbs is over the last month.  Patient has shortness of breath due to COPD but this has not worsened.  They are unable to send transmission today but will send tomorrow morning.  I told brother it did not sound as if patient needed lasix at this time but if swelling worsened it would be OK to take.  I told them we would call with report of fluid levels from device transmission

## 2023-09-04 NOTE — Telephone Encounter (Signed)
Pt c/o swelling/edema: STAT if pt has developed SOB within 24 hours  If swelling, where is the swelling located? ankles  How much weight have you gained and in what time span? 2 pds   Have you gained 2 pounds in a day or 5 pounds in a week? Na   Do you have a log of your daily weights (if so, list)? no  Are you currently taking a fluid pill? no  Are you currently SOB? no  Have you traveled recently in a car or plane for an extended period of time? No  Brother wants to know if it is ok to give him a fluid pill. Please advise

## 2023-09-04 NOTE — Telephone Encounter (Signed)
Per review of Pt's chart, he has not historically been followed by [facility] clinic for heart failure management.   Will forward to Dr. Elberta Fortis to see if he would like to refer Pt.  Current optivol does not show Pt is fluid overloaded.

## 2023-09-05 ENCOUNTER — Other Ambulatory Visit: Payer: Self-pay

## 2023-09-05 DIAGNOSIS — R600 Localized edema: Secondary | ICD-10-CM

## 2023-09-05 NOTE — Telephone Encounter (Signed)
Left message to call back  

## 2023-09-05 NOTE — Telephone Encounter (Signed)
Message sent to [facility] nurse to enroll Pt in monthly checks to evaluate fluid levels.  Routing to gen cards for current fluid management.

## 2023-09-05 NOTE — Telephone Encounter (Signed)
Patient does not have general cardiologist.  Will need referral placed.   Left message for patient's brother to call office.

## 2023-09-05 NOTE — Telephone Encounter (Signed)
[name] returned call.  ?

## 2023-09-05 NOTE — Progress Notes (Signed)
Per Dr. Elberta Fortis, order for a referral to general cardiology for the management of the bilateral lower leg edema. LVM for patient's brother, per DPR on file, of the plan and told to call back if any questions arise.

## 2023-09-06 NOTE — Telephone Encounter (Signed)
 Caregiver returning call to nurse

## 2023-09-06 NOTE — Telephone Encounter (Signed)
Spoke to brother. Aware pt needs to establish w/ a general cardiologist and office will call to arrange. He is agreeable to plan.

## 2023-09-07 ENCOUNTER — Telehealth: Payer: Self-pay | Admitting: Cardiology

## 2023-09-07 NOTE — Telephone Encounter (Signed)
Spoke to brother, aware I placed referral for Dr. Izora Ribas.  Cassie -  can you make sure this pt's referral is [facility]rase[facility].  Maybe this pt can get on same day as his brother Karlo [place], [place]: 09/04/45) - can someone call him to try and arrange both for same day if possible.

## 2023-09-07 NOTE — Telephone Encounter (Signed)
Sherri  Pt's brother called to let you know that he was referred to Dr Izora Ribas so would like brother to be able to see the same for gen card

## 2023-09-11 ENCOUNTER — Ambulatory Visit: Payer: Medicare Other | Admitting: Podiatry

## 2023-09-11 DIAGNOSIS — M79674 Pain in right toe(s): Secondary | ICD-10-CM

## 2023-09-11 DIAGNOSIS — M79675 Pain in left toe(s): Secondary | ICD-10-CM

## 2023-09-11 DIAGNOSIS — B351 Tinea unguium: Secondary | ICD-10-CM

## 2023-09-11 NOTE — Progress Notes (Signed)
  Subjective:  Patient ID: Mike Gray, male    DOB: 02-Mar-1941,  MRN: [id number]  82 y.o. male presents painful elongated mycotic toenails 1-5 bilaterally which are tender when wearing enclosed shoe gear. Pain is relieved with periodic professional debridement. Chief Complaint  Patient presents with   Nail Problem    RFC  LAST A1c- DK    New problem(s): None   PCP is [name], [name], MD.  No Known Allergies  Review of Systems: Negative except as noted in the HPI.   Objective:  Mahith Dornbos is a pleasant 82 y.o. male in NAD.[name] Kitchen AAO x 3.  Vascular Examination: Vascular status intact b/l with palpable pedal pulses. CFT immediate b/l. No edema. No pain with calf compression b/l. Skin temperature gradient WNL b/l. Pedal hair sparse.  Neurological Examination: Sensation grossly intact b/l with 10 gram monofilament. Vibratory sensation intact b/l.   Dermatological Examination: Pedal skin with normal turgor, texture and tone b/l. Toenails 1-5 b/l thick, discolored, elongated with subungual debris and pain on dorsal palpation. No hyperkeratotic lesions noted b/l.   There is evidence of subacute subungual hematoma of the R 3rd toe and R 4th toe. Nailplate remains adhered. There is no  tenderness to palpation.  Pincer nail deformity bilateral great toes. No erythema, no edema, no drainage, no fluctuance. Nail border hypertrophy absent b/l great toes.  Sign(s) of infection: no clinical signs of infection noted on examination today.  Musculoskeletal Examination: Muscle strength 5/5 to b/l LE. No pain, crepitus or joint limitation noted with ROM bilateral LE. No gross bony deformities bilaterally.  Radiographs: None  Last A1c:       No data to display         Assessment:   1. Pain due to onychomycosis of toenails of both feet    Plan:  -Consent given for treatment as described below: -Examined patient. -Continue supportive shoe gear daily. -Toenails 1-5 b/l were debrided in  length and girth with sterile nail nippers and dremel without iatrogenic bleeding.  -Patient/POA to call should there be question/concern in the interim.  Return in about 3 months (around 12/12/2023).  Freddie Breech, DPM      [place] LOCATION: [address], [place] [address]                   Office [phone]   [facility] LOCATION: [address] [place], [place] [address] Office [phone]

## 2023-09-16 ENCOUNTER — Encounter: Payer: Self-pay | Admitting: Podiatry

## 2023-10-02 ENCOUNTER — Encounter: Payer: Self-pay | Admitting: Cardiology

## 2023-10-02 ENCOUNTER — Telehealth: Payer: Self-pay | Admitting: Cardiology

## 2023-10-02 NOTE — Telephone Encounter (Signed)
Left message to call back  

## 2023-10-02 NOTE — Telephone Encounter (Signed)
  1. Has your device fired?   2. Is you device beeping?   3. Are you experiencing draining or swelling at device site?   4. Are you calling to see if we received your device transmission? yes  5. Have you passed out?     Please route to Device Clinic Pool

## 2023-10-02 NOTE — Telephone Encounter (Signed)
Pt c/o swelling/edema: STAT if pt has developed SOB within 24 hours  If swelling, where is the swelling located? ankles  How much weight have you gained and in what time span? Not sure  Have you gained 2 pounds in a day or 5 pounds in a week?   Do you have a log of your daily weights (if so, list)?   Are you currently taking a fluid pill? Yes,   Are you currently SOB? Always short of breath, he has COPD  Have you traveled recently in a car or plane for an extended period of time? no

## 2023-10-02 NOTE — Telephone Encounter (Signed)
Spoke to brother.   Reports they noticed ankle edema at pt's dermatology appt yesterday, started maybe a day/two before.  The ankle edema that pt experienced last month resolved, this would be a new incident. I asked who has been monitoring/prescribing lasix.  He his not sure but thinks it is the [facility] agency RN with [facility]. Says they ordered it once for patient.  He is going to reach out to them to evaluate. No other symptoms; denies weight gain, swelling in other areas or anymore SOB than pt usually has d/t COPD.  Aware I will send to scheduling to see if sooner appt available and to ensure pt is on wait list if sooner appt comes open. Brother is agreeable to plan.

## 2023-10-02 NOTE — Telephone Encounter (Signed)
Pt's brother returning the nurses phone call. Please advise

## 2023-10-02 NOTE — Telephone Encounter (Signed)
Called pt and spoke to pts brother to advised pts transmission is normal and Optivol is not elevated.   States patient had some swelling in lower legs and wanted to know further on what to do in regard to diuretics.

## 2023-10-03 MED ORDER — FUROSEMIDE 20 MG PO TABS: 20.0000 mg | ORAL_TABLET | Freq: Every day | ORAL | 0 refills | Status: AC | PRN

## 2023-10-03 NOTE — Telephone Encounter (Signed)
See patient message.  Lasix sent to pharmacy per Dr. Elberta Fortis.  Continue to monitor.

## 2023-10-08 ENCOUNTER — Encounter: Payer: Self-pay | Admitting: Internal Medicine

## 2023-10-19 ENCOUNTER — Ambulatory Visit: Payer: Medicare Other

## 2023-10-19 DIAGNOSIS — I442 Atrioventricular block, complete: Secondary | ICD-10-CM

## 2023-10-20 LAB — CUP PACEART REMOTE DEVICE CHECK
Battery Remaining Longevity: 87 mo
Battery Voltage: 3.01 V
Brady Statistic AP VP Percent: 99.12 %
Brady Statistic AP VS Percent: 0.14 %
Brady Statistic AS VP Percent: 0.74 %
Brady Statistic AS VS Percent: 0.01 %
Brady Statistic RA Percent Paced: 99.19 %
Brady Statistic RV Percent Paced: 99.42 %
Date Time Interrogation Session: 20241227001703
HighPow Impedance: 67 Ohm
Implantable Lead Connection Status: 753985
Implantable Lead Connection Status: 753985
Implantable Lead Implant Date: 20070328
Implantable Lead Implant Date: 20150323
Implantable Lead Location: 753859
Implantable Lead Location: 753860
Implantable Lead Model: 5076
Implantable Pulse Generator Implant Date: 20230928
Lead Channel Impedance Value: 304 Ohm
Lead Channel Impedance Value: 361 Ohm
Lead Channel Impedance Value: 475 Ohm
Lead Channel Pacing Threshold Amplitude: 0.625 V
Lead Channel Pacing Threshold Amplitude: 0.75 V
Lead Channel Pacing Threshold Pulse Width: 0.4 ms
Lead Channel Pacing Threshold Pulse Width: 0.4 ms
Lead Channel Sensing Intrinsic Amplitude: 1.5 mV
Lead Channel Sensing Intrinsic Amplitude: 1.5 mV
Lead Channel Sensing Intrinsic Amplitude: 5.5 mV
Lead Channel Sensing Intrinsic Amplitude: 5.5 mV
Lead Channel Setting Pacing Amplitude: 1.5 V
Lead Channel Setting Pacing Amplitude: 2 V
Lead Channel Setting Pacing Pulse Width: 0.4 ms
Lead Channel Setting Sensing Sensitivity: 0.3 mV
Zone Setting Status: 755011
Zone Setting Status: 755011

## 2023-11-09 ENCOUNTER — Ambulatory Visit: Payer: Medicare Other | Admitting: Internal Medicine

## 2023-11-09 NOTE — Progress Notes (Deleted)
Office Visit    Patient Name: Mike Gray Date of Encounter: 11/09/2023  Primary Care Provider:  Darrow Bussing, MD Primary Cardiologist:  Will Jorja Loa, MD Primary Electrophysiologist: Will Jorja Loa, MD  Chief Complaint    Mike Gray is a 83 y.o. male with PMH of HCM, NSVT, CHB s/p ICD (VT and HCM), PAF (on Eliquis), HTN, CHF and COPD, Bipolar disorder who presents today for surgical clearance for upcoming hernia surgery.  Past Medical History    Past Medical History:  Diagnosis Date   Angiodysplasia    Atrial fibrillation (HCC)    CHF (congestive heart failure) (HCC)    COPD (chronic obstructive pulmonary disease) (HCC)    GI bleed    Hypertension    Presence of permanent cardiac pacemaker    Past Surgical History:  Procedure Laterality Date   COLONOSCOPY     ICD GENERATOR CHANGEOUT N/A 07/20/2022   Procedure: ICD GENERATOR CHANGEOUT;  Surgeon: Regan Lemming, MD;  Location: [facility] INVASIVE CV LAB;  Service: Cardiovascular;  Laterality: N/A;   INGUINAL HERNIA REPAIR Right 12/14/2022   Procedure: OPEN RIGHT HERNIA REPAIR INGUINAL ADULT WITH MESH;  Surgeon: Axel Filler, MD;  Location: [facility] OR;  Service: General;  Laterality: Right;   PACEMAKER IMPLANT      Allergies  No Known Allergies  History of Present Illness    Mike Gray  is a 83 year old male with the above mention past medical history who presents today for surgical clearance of upcoming hernia surgery.  [name] was first seen by Dr. Elberta Fortis and 2019 by request of his PCP for management of  ICD/hypertrophic cardiomyopathy.  He had a Medtronic dual-chamber ICD placed in 2007 he was hospitalized on 06/2020 with shortness of breath and dyspnea on exertion with weakness and fatigue.  Device download was completed showing multiple episodes of NSVT.  His beta-blocker was increased to 200 mg but was unable to tolerate titration and reduce back to 100 mg.  He presented to the ED and was found to  be hypoxic with O2 sat of 83% on room air he required BiPAP and CT of the head was completed that was negative for acute abnormalities.  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  His BNP was noted to be elevated.  He was found to be in atrial fibrillation and was loaded on amiodarone and converted to sinus rhythm.  He had a generator change out on 07/20/2022 that was successful.  2D echo was completed 05/2022 with reduced EF of 30-35% secondary to medications being adjusted for AKI.  He was seen in follow-up by Otilio Saber, PA and was stable on appropriate medications with NYHA class II symptoms but euvolemic.  He refused medication titrations due to recent AKI on losartan.  No further medication changes were made at that time.  Mike Gray presents today for preoperative clearance visit with his brother.  Since last being seen in the office patient reports that he is doing well with no new cardiac complaints.  His blood pressure today is well-controlled at 126/80 and heart rate was 62 bpm.  He reports compliance with current medication regimen and denies any adverse reactions.  He is euvolemic on examination with trace lower extremity edema and lower extremities.  He is scheduled to undergo ventral hernia repair surgery.  Patient denies chest pain, palpitations, dyspnea, PND,  orthopnea, nausea, vomiting, dizziness, syncope, edema, weight gain, or early satiety.   Home Medications    Current Outpatient Medications  Medication Sig Dispense Refill   albuterol (VENTOLIN HFA) 108 (90 Base) MCG/ACT inhaler INHALE 2 PUFFS BY MOUTH EVERY 6 HOURS AS NEEDED 9 g 3   amiodarone (PACERONE) 200 MG tablet Take 1/2 (one-half) tablet by mouth once daily 45 tablet 1   apixaban (ELIQUIS) 5 MG TABS tablet Take 1 tablet (5 mg total) by mouth 2 (two) times daily. 60 tablet 5   atorvastatin  (LIPITOR) 20 MG tablet Take 20 mg by mouth at bedtime.     divalproex (DEPAKOTE ER) 500 MG 24 hr tablet Take 1,000 mg by mouth daily.      finasteride (PROSCAR) 5 MG tablet Take 5 mg by mouth at bedtime.     Fluticasone-Umeclidin-Vilant (TRELEGY ELLIPTA) 100-62.5-25 MCG/ACT AEPB Inhale 1 each into the lungs daily. 1 each 11   Fluticasone-Umeclidin-Vilant (TRELEGY ELLIPTA) 100-62.5-25 MCG/ACT AEPB Inhale 1 puff into the lungs daily. 28 each 0   furosemide (LASIX) 20 MG tablet Take 1 tablet (20 mg total) by mouth daily as needed for edema. 30 tablet 0   metoprolol succinate (TOPROL-XL) 100 MG 24 hr tablet Take 1 tablet (100 mg total) by mouth daily. Take with or immediately following a meal. Take along with 50 mg (Total 150 mg) 90 tablet 2   metoprolol succinate (TOPROL-XL) 50 MG 24 hr tablet Take 1 tablet (50 mg total) by mouth daily. Take with or immediately following a meal. Take along with 100 mg (Total 150 mg) 90 tablet 2   niacin 500 MG tablet Take 500 mg by mouth at bedtime.     tamsulosin (FLOMAX) 0.4 MG CAPS capsule Take 1 capsule (0.4 mg total) by mouth daily after supper. (Patient taking differently: Take 0.8 mg by mouth daily after supper.) 30 capsule 0   No current facility-administered medications for this visit.     Review of Systems  Please see the history of present illness.    All other systems reviewed and are otherwise negative except as noted above.  Physical Exam    Wt Readings from Last 3 Encounters:  08/09/23 178 lb 6.4 oz (80.9 kg)  02/13/23 170 lb 6.4 oz (77.3 kg)  01/15/23 167 lb (75.8 kg)   ZO:XWRUE were no vitals filed for this visit.,There is no height or weight on file to calculate BMI.  Constitutional:      Appearance: Healthy appearance. Not in distress.  Neck:     Vascular: JVD normal.  Pulmonary:     Effort: Pulmonary effort is normal.     Breath sounds: No wheezing. No rales. Diminished in the bases Cardiovascular:     Normal rate. Regular rhythm.  Normal S1. Normal S2.      Murmurs: There is no murmur.  Edema:    Peripheral edema absent.  Abdominal:     Palpations: Abdomen is soft non tender. There is no hepatomegaly.  Skin:    General: Skin is warm and dry.  Neurological:     General: No focal deficit present.     Mental Status: Alert and oriented to person, place and time.     Cranial Nerves: Cranial nerves are intact.  EKG/LABS/Other Studies Reviewed    ECG personally reviewed by me today -AV dual paced rhythm with a rate of 70 bpm and no acute changes consistent with previous EKG.  Risk Assessment/Calculations:    CHA2DS2-VASc Score =  This indicates a  % annual risk of stroke. The patient's score is based upon:     {This patient has a significant risk of stroke if diagnosed with atrial fibrillation.  Please consider VKA or DOAC agent for anticoagulation if the bleeding risk is acceptable.   You can also use the SmartPhrase .[email or web address] for documentation.   :[id number]}        Lab Results  Component Value Date   WBC 5.5 12/07/2022   HGB 13.5 12/07/2022   HCT 41.5 12/07/2022   MCV 90.2 12/07/2022   PLT 190 12/07/2022   Lab Results  Component Value Date   CREATININE 1.46 (H) 12/07/2022   BUN 32 (H) 12/07/2022   NA 139 12/07/2022   K 4.4 12/07/2022   CL 102 12/07/2022   CO2 29 12/07/2022   Lab Results  Component Value Date   ALT 14 01/15/2023   AST 26 01/15/2023   ALKPHOS 73 01/15/2023   BILITOT 0.4 01/15/2023   Lab Results  Component Value Date   CHOL 87 07/29/2021   HDL 34 (L) 07/29/2021   LDLCALC 40 07/29/2021   TRIG 63 07/29/2021   CHOLHDL 2.6 07/29/2021    Lab Results  Component Value Date   HGBA1C 6.2 (H) 07/31/2021    Assessment & Plan    1.  Preoperative clearance: -The patient affirms he has been doing well without any new cardiac symptoms. They are able to achieve 4 METS without cardiac limitations. Therefore, based on ACC/AHA guidelines, the patient would be at acceptable risk  for the planned procedure without further cardiovascular testing. The patient was advised that if he develops new symptoms prior to surgery to contact our office to arrange for a follow-up visit, and he verbalized understanding.   Mr. Fayer perioperative risk of a major cardiac event is  % according to the Revised Cardiac Risk Index (RCRI).  Therefore, he is at high risk for perioperative complications.   His functional capacity is fair at   METs according to the Duke Activity Status Index (DASI). Recommendations: According to ACC/AHA guidelines, no further cardiovascular testing needed.  The patient may proceed to surgery at acceptable risk.   Antiplatelet and/or Anticoagulation Recommendations:  Eliquis (Apixaban) can be held for 2 days prior to surgery.  Please resume post op when felt to be safe.      2.  Chronic systolic CHF: -Medtronic dual chamber ICD  -2D echo completed 06/2020 showing EF of 30-35% and patient refused med adjustment at that time due to recent AKI. -Today patient is euvolemic on examination -Low sodium diet, fluid restriction <2L, and daily weights encouraged. Educated to contact our office for weight gain of 2 lbs overnight or 5 lbs in one week.    3.  Paroxysmal atrial fibrillation: -Patient currently on amiodarone 100 mg daily and Eliquis 5 mg twice daily -Continue Toprol 50 mg daily  4.  Essential hypertension: -Patient's blood pressure today was well-controlled at 126/80 -Continue Toprol XL 50 mg daily  Disposition: Follow-up with Will Jorja Loa, MD as scheduled   Medication Adjustments/Labs and Tests Ordered: Current medicines are reviewed at length with the patient today.  Concerns regarding medicines are outlined above.   Signed, Christell Constant, NP 11/09/2023, 8:40 AM [facility]  Note:  This document was prepared using Dragon voice recognition software and may include unintentional dictation errors.

## 2023-11-12 ENCOUNTER — Encounter: Payer: Self-pay | Admitting: Internal Medicine

## 2023-11-22 NOTE — Progress Notes (Signed)
Remote ICD transmission.

## 2023-11-22 NOTE — Addendum Note (Signed)
Addended by: Elease Etienne A on: 11/22/2023 03:37 PM   Modules accepted: Orders

## 2023-11-29 ENCOUNTER — Ambulatory Visit: Payer: Medicare Other | Admitting: Internal Medicine

## 2023-12-17 ENCOUNTER — Other Ambulatory Visit: Payer: Self-pay | Admitting: Internal Medicine

## 2024-01-18 ENCOUNTER — Other Ambulatory Visit: Payer: Self-pay | Admitting: Internal Medicine

## 2024-01-18 ENCOUNTER — Ambulatory Visit (INDEPENDENT_AMBULATORY_CARE_PROVIDER_SITE_OTHER): Payer: Medicare Other

## 2024-01-18 DIAGNOSIS — J439 Emphysema, unspecified: Secondary | ICD-10-CM

## 2024-01-18 DIAGNOSIS — I442 Atrioventricular block, complete: Secondary | ICD-10-CM

## 2024-01-18 LAB — CUP PACEART REMOTE DEVICE CHECK
Battery Remaining Longevity: 83 mo
Battery Voltage: 3 V
Brady Statistic AP VP Percent: 99.02 %
Brady Statistic AP VS Percent: 0.23 %
Brady Statistic AS VP Percent: 0.74 %
Brady Statistic AS VS Percent: 0.01 %
Brady Statistic RA Percent Paced: 99.18 %
Brady Statistic RV Percent Paced: 99.06 %
Date Time Interrogation Session: 20250328063525
HighPow Impedance: 71 Ohm
Implantable Lead Connection Status: 753985
Implantable Lead Connection Status: 753985
Implantable Lead Implant Date: 20070328
Implantable Lead Implant Date: 20150323
Implantable Lead Location: 753859
Implantable Lead Location: 753860
Implantable Lead Model: 5076
Implantable Pulse Generator Implant Date: 20230928
Lead Channel Impedance Value: 304 Ohm
Lead Channel Impedance Value: 342 Ohm
Lead Channel Impedance Value: 456 Ohm
Lead Channel Pacing Threshold Amplitude: 0.625 V
Lead Channel Pacing Threshold Amplitude: 0.75 V
Lead Channel Pacing Threshold Pulse Width: 0.4 ms
Lead Channel Pacing Threshold Pulse Width: 0.4 ms
Lead Channel Sensing Intrinsic Amplitude: 1 mV
Lead Channel Sensing Intrinsic Amplitude: 1 mV
Lead Channel Sensing Intrinsic Amplitude: 6.625 mV
Lead Channel Sensing Intrinsic Amplitude: 6.625 mV
Lead Channel Setting Pacing Amplitude: 1.5 V
Lead Channel Setting Pacing Amplitude: 2 V
Lead Channel Setting Pacing Pulse Width: 0.4 ms
Lead Channel Setting Sensing Sensitivity: 0.3 mV
Zone Setting Status: 755011
Zone Setting Status: 755011

## 2024-01-22 ENCOUNTER — Encounter: Payer: Self-pay | Admitting: Podiatry

## 2024-01-22 ENCOUNTER — Ambulatory Visit (INDEPENDENT_AMBULATORY_CARE_PROVIDER_SITE_OTHER): Payer: Medicare Other | Admitting: Podiatry

## 2024-01-22 VITALS — Ht 67.0 in | Wt 178.0 lb

## 2024-01-22 DIAGNOSIS — M79674 Pain in right toe(s): Secondary | ICD-10-CM

## 2024-01-22 DIAGNOSIS — M79675 Pain in left toe(s): Secondary | ICD-10-CM | POA: Diagnosis not present

## 2024-01-22 DIAGNOSIS — B351 Tinea unguium: Secondary | ICD-10-CM

## 2024-01-22 NOTE — Progress Notes (Unsigned)
  Subjective:  Patient ID: Mike Gray, male    DOB: 1941-09-07,  MRN: [id number]  83 y.o. male presents painful, elongated thickened toenails x 10 which are symptomatic when wearing enclosed shoe gear. This interferes with his/her daily activities. Chief Complaint  Patient presents with   RFC    He is here for a nail trim, "PCP is Dr Clifton Custard, and he is good enough"   New problem(s): None   PCP is [name], [name], MD.  No Known Allergies  Review of Systems: Negative except as noted in the HPI.   Objective:  Mike Gray is a pleasant 83 y.o. male WD, WN in NAD. AAO x 3.  Vascular Examination: Vascular status intact b/l with palpable pedal pulses. CFT immediate b/l. No edema. No pain with calf compression b/l. Skin temperature gradient WNL b/l. Pedal hair sparse.  Neurological Examination: Sensation grossly intact b/l with 10 gram monofilament. Vibratory sensation intact b/l.   Dermatological Examination: Pedal skin with normal turgor, texture and tone b/l. Toenails 1-5 b/l thick, discolored, elongated with subungual debris and pain on dorsal palpation. No hyperkeratotic lesions noted b/l.   Pincer nail deformity bilateral great toes. No erythema, no edema, no drainage, no fluctuance. Nail border hypertrophy absent b/l great toes.  Sign(s) of infection: no clinical signs of infection noted on examination today.  Musculoskeletal Examination: Muscle strength 5/5 to b/l LE. No pain, crepitus or joint limitation noted with ROM bilateral LE. No gross bony deformities bilaterally.  Radiographs: None  Assessment:   1. Pain due to onychomycosis of toenails of both feet    Plan:  Patient was evaluated and treated. All patient's and/or POA's questions/concerns addressed on today's visit. Mycotic toenails 2-5 debrided in length and girth without incident. Continue soft, supportive shoe gear daily. No invasive procedure(s) performed. Offending nail border debrided and curretaged  bilateral great toes utilizing sterile nail nipper and currette. Border cleansed with alcohol and triple antibiotic ointment applied. No further treatment required by patient/caregiver. Call office if there are any concerns. Report any pedal injuries to medical professional. Patient/POA to call should there be question/concern in the interim.  Return in about 3 months (around 04/22/2024).  Freddie Breech, DPM      [place] LOCATION: [address], [place] [address]                   Office [phone]   [facility] LOCATION: [address] [place], [place] [address] Office [phone]

## 2024-01-23 ENCOUNTER — Other Ambulatory Visit: Payer: Self-pay | Admitting: Cardiology

## 2024-01-24 ENCOUNTER — Encounter: Payer: Self-pay | Admitting: Podiatry

## 2024-01-31 ENCOUNTER — Ambulatory Visit: Attending: Internal Medicine | Admitting: Internal Medicine

## 2024-01-31 VITALS — BP 130/80 | HR 64 | Ht 68.0 in | Wt 172.0 lb

## 2024-01-31 DIAGNOSIS — R600 Localized edema: Secondary | ICD-10-CM | POA: Diagnosis not present

## 2024-01-31 DIAGNOSIS — I480 Paroxysmal atrial fibrillation: Secondary | ICD-10-CM | POA: Insufficient documentation

## 2024-01-31 DIAGNOSIS — Z79899 Other long term (current) drug therapy: Secondary | ICD-10-CM | POA: Diagnosis present

## 2024-01-31 DIAGNOSIS — I422 Other hypertrophic cardiomyopathy: Secondary | ICD-10-CM | POA: Diagnosis present

## 2024-01-31 DIAGNOSIS — I442 Atrioventricular block, complete: Secondary | ICD-10-CM | POA: Insufficient documentation

## 2024-01-31 NOTE — Patient Instructions (Signed)
 Medication Instructions:  Your physician recommends that you continue on your current medications as directed. Please refer to the Current Medication list given to you today.  *If you need a refill on your cardiac medications before your next appointment, please call your pharmacy*  Lab Work: TODAY: LFTs, TSH, CBC If you have labs (blood work) drawn today and your tests are completely normal, you will receive your results only by: MyChart Message (if you have MyChart) OR A paper copy in the mail If you have any lab test that is abnormal or we need to change your treatment, we will call you to review the results.  Follow-Up: At [facility], you and your health needs are our priority.  As part of our continuing mission to provide you with exceptional heart care, we have created designated Provider Care Teams.  These Care Teams include your primary Cardiologist (physician) and Advanced Practice Providers (APPs -  Physician Assistants and Nurse Practitioners) who all work together to provide you with the care you need, when you need it.  Your next appointment:   1 year(s)  The format for your next appointment:   In Person  Provider:   Christell Constant, MD {  Other Instructions   1st Floor: - Lobby - Registration  - Pharmacy  - Lab - Cafe  2nd Floor: - PV Lab - Diagnostic Testing (echo, CT, nuclear med)  3rd Floor: - Vacant  4th Floor: - TCTS (cardiothoracic surgery) - AFib Clinic - Structural Heart Clinic - Vascular Surgery  - Vascular Ultrasound  5th Floor: - HeartCare Cardiology (general and EP) - Clinical Pharmacy for coumadin, hypertension, lipid, weight-loss medications, and med management appointments    Valet parking services will be available as well.

## 2024-01-31 NOTE — Progress Notes (Signed)
 Cardiology Office Note:  .    Date:  01/31/2024  ID:  [name], DOB [birth year], MRN [id number] PCP: Darrow Bussing, MD  [facility] Providers Cardiologist:  Christell Constant, MD Electrophysiologist:  Will Jorja Loa, MD well    CC: Get Checked out Consulted for the evaluation of HCM  at the behest of Dr. Loman Brooklyn   History of Present Illness: .    Mike Gray is a 83 y.o. male with heart failure and arrhythmias who presents for cardiovascular evaluation. He is accompanied by his brother, who is also his brother.  He has a history of heart failure with reduced ejection fraction, currently at 30-35%, and severe left ventricular dilation with moderate hypertrophy. He is asymptomatic with no recent exacerbations and exercises twice a week without experiencing shortness of breath or chest pain. His brother monitors his health closely, including his oxygen levels and blood pressure, although the latter is not checked daily.  He experiences non-sustained ventricular tachycardia and is on a low dose of amiodarone for management. He also has paroxysmal atrial fibrillation and is on blood thinners to reduce the risk of stroke. No shortness of breath, chest pain, or any significant heart issues currently. He feels good during exercise and does not get out of breath.  His ICD, a Medtronic dual chamber device, is functioning well with minimal ectopy. He has a history of a pacemaker and defibrillator implantation, with the pacemaker being placed many years ago due to a low heart rate. He recalls being very tired after working night shifts before the pacemaker was implanted.  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 There was a past hospitalization due to low oxygen levels, but he has been stable since then. He uses oxygen and monitors his oxygen levels regularly.  He has been drinking one to two bottles of water a day to help with bowel  issues. His brother notes that he has a tremor, which he attributes to his medication, specifically Depakote. His diet has been adjusted over the years, and he now eats a good breakfast and picks at food throughout the day. Despite dietary changes, he maintains his weight. He has no children, and his brother is his primary family support. His brother also has a history of cardiac issues and is monitored by the same medical team.  Relevant histories: .  Social  - I see his brother; no HCM in the family; brother has no HCM but CAD ROS: As per HPI.  Discussed the use of AI scribe software for clinical note transcription with the patient, who gave verbal consent to proceed.   Studies Reviewed: .   Cardiac Studies & Procedures   ______________________________________________________________________________________________   STRESS TESTS  MYOCARDIAL PERFUSION IMAGING 06/01/2013   ECHOCARDIOGRAM  ECHOCARDIOGRAM COMPLETE 06/09/2022  Narrative ECHOCARDIOGRAM REPORT    Patient Name:   Mike Gray Date of Exam: 06/09/2022 Medical Rec #:  [id number]     Height:       67.0 in Accession #:    [id number]    Weight:       162.0 lb Date of Birth:  1941-10-11    BSA:          1.849 m Patient Age:    80 years      BP:           130/72 mmHg Patient Gender: M             HR:  81 bpm. Exam Location:  [address]  Procedure: 2D Echo, Cardiac Doppler and Color Doppler  Indications:    I50.9* Heart failure (unspecified)  History:        Patient has prior history of Echocardiogram examinations, most recent 07/05/2020. Defibrillator, COPD, Arrythmias:Atrial Fibrillation; Risk Factors:Hypertension and Dyslipidemia. 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  Sonographer:    Cathie Beams RCS Referring Phys: Graciella Freer  IMPRESSIONS   1. Left ventricular ejection fraction, by estimation, is 30 to 35%. The left ventricle has moderately decreased function.  The left ventricle demonstrates global hypokinesis. The left ventricular internal cavity size was severely dilated. There is moderate left ventricular hypertrophy. Left ventricular diastolic parameters are consistent with Grade II diastolic dysfunction (pseudonormalization). Elevated left ventricular end-diastolic pressure. There is akinesis of the left ventricular, entire inferoseptal wall, inferior wall and anteroseptal wall. There is dyskinesis of the left ventricular, apical apical segment. 2. Right ventricular systolic function is moderately reduced. The right ventricular size is normal. There is normal pulmonary artery systolic pressure. 3. Left atrial size was moderately dilated. 4. Right atrial size was moderately dilated. 5. The mitral valve is grossly normal. Mild mitral valve regurgitation. No evidence of mitral stenosis. 6. The aortic valve is tricuspid. There is mild calcification of the aortic valve. There is mild thickening of the aortic valve. Aortic valve regurgitation is not visualized. Aortic valve sclerosis/calcification is present, without any evidence of aortic stenosis. 7. Aortic dilatation noted. There is borderline dilatation of the ascending aorta, measuring 38 mm. 8. The inferior vena cava is normal in size with greater than 50% respiratory variability, suggesting right atrial pressure of 3 mmHg.  Comparison(s): Prior images reviewed side by side. Changes from prior study are noted. Inferoseptal and inferior walls appear hypokinetic on review of prior study; more prominent WMA on current study.  Conclusion(s)/Recommendation(s): EF decreased compared to prior study, with more prominent wall motion abnormalities as noted.  FINDINGS Left Ventricle: Left ventricular ejection fraction, by estimation, is 30 to 35%. The left ventricle has moderately decreased function. The left ventricle demonstrates global hypokinesis. The left ventricular internal cavity size was severely  dilated. There is moderate left ventricular hypertrophy. Left ventricular diastolic parameters are consistent with Grade II diastolic dysfunction (pseudonormalization). Elevated left ventricular end-diastolic pressure.  Right Ventricle: The right ventricular size is normal. Right vetricular wall thickness was not well visualized. Right ventricular systolic function is moderately reduced. There is normal pulmonary artery systolic pressure. The tricuspid regurgitant velocity is 2.42 m/s, and with an assumed right atrial pressure of 3 mmHg, the estimated right ventricular systolic pressure is 26.4 mmHg.  Left Atrium: Left atrial size was moderately dilated.  Right Atrium: Right atrial size was moderately dilated.  Pericardium: There is no evidence of pericardial effusion.  Mitral Valve: The mitral valve is grossly normal. There is mild thickening of the mitral valve leaflet(s). There is mild calcification of the mitral valve leaflet(s). Mild to moderate mitral annular calcification. Mild mitral valve regurgitation. No evidence of mitral valve stenosis.  Tricuspid Valve: The tricuspid valve is normal in structure. Tricuspid valve regurgitation is mild . No evidence of tricuspid stenosis.  Aortic Valve: The aortic valve is tricuspid. There is mild calcification of the aortic valve. There is mild thickening of the aortic valve. Aortic valve regurgitation is not visualized. Aortic valve sclerosis/calcification is present, without any evidence of aortic stenosis.  Pulmonic Valve: The pulmonic valve was not well visualized. Pulmonic valve regurgitation is mild. No evidence of pulmonic  stenosis.  Aorta: Aortic dilatation noted. There is borderline dilatation of the ascending aorta, measuring 38 mm.  Venous: The inferior vena cava is normal in size with greater than 50% respiratory variability, suggesting right atrial pressure of 3 mmHg.  IAS/Shunts: The atrial septum is grossly normal.  Additional  Comments: A device lead is visualized.   LEFT VENTRICLE PLAX 2D LVIDd:         6.10 cm   Diastology LVIDs:         4.80 cm   LV e' medial:    4.43 cm/s LV PW:         1.30 cm   LV E/e' medial:  24.8 LV IVS:        1.70 cm   LV e' lateral:   6.50 cm/s LVOT diam:     2.10 cm   LV E/e' lateral: 16.9 LV SV:         67 LV SV Index:   36 LVOT Area:     3.46 cm   RIGHT VENTRICLE RV Basal diam:  3.30 cm RV S prime:     7.31 cm/s TAPSE (M-mode): 1.5 cm RVSP:           26.4 mmHg  LEFT ATRIUM             Index        RIGHT ATRIUM           Index LA diam:        4.00 cm 2.16 cm/m   RA Pressure: 3.00 mmHg LA Vol (A2C):   72.9 ml 39.42 ml/m  RA Area:     22.70 cm LA Vol (A4C):   92.8 ml 50.18 ml/m  RA Volume:   63.70 ml  34.45 ml/m LA Biplane Vol: 83.0 ml 44.88 ml/m AORTIC VALVE LVOT Vmax:   117.00 cm/s LVOT Vmean:  61.000 cm/s LVOT VTI:    0.192 m  AORTA Ao Root diam: 3.20 cm Ao Asc diam:  3.80 cm  MITRAL VALVE                TRICUSPID VALVE MV Area (PHT): 4.71 cm     TR Peak grad:   23.4 mmHg MV Decel Time: 161 msec     TR Vmax:        242.00 cm/s MV E velocity: 110.00 cm/s  Estimated RAP:  3.00 mmHg MV A velocity: 60.80 cm/s   RVSP:           26.4 mmHg MV E/A ratio:  1.81 SHUNTS Systemic VTI:  0.19 m Systemic Diam: 2.10 cm  Jodelle Red MD Electronically signed by Jodelle Red MD Signature Date/Time: 06/10/2022/2:17:14 PM    Final          ______________________________________________________________________________________________       Physical Exam:    VS:  BP 130/80 (BP Location: Left Arm)   Pulse 64   Ht 5\' 8"  (1.727 m)   Wt 78 kg   SpO2 95%   BMI 26.15 kg/m    Wt Readings from Last 3 Encounters:  01/31/24 78 kg  01/22/24 80.7 kg  08/09/23 80.9 kg    Gen: no distress Neck: No JVD Cardiac: No Rubs or Gallops, systolic murmur, RRR +2 radial pulses Respiratory: Rare expiratory wheeze, normal rate GI: Soft, nontender,  non-distended  MS: No  edema;  moves all extremities Integument: Skin feels warm Neuro:  At time of evaluation, alert and oriented to person/place/time/situation  Psych: Normal affect, patient feels  well   ASSESSMENT AND PLAN: .     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 Severe left ventricular dilation, moderate hypertrophy, and wall motion abnormalities. Asymptomatic, exercises twice weekly without dyspnea or chest pain. Echocardiogram from 2023 showed thickened, slightly dilated heart with moderately reduced squeeze. No definitive evidence of hypertrophic cardiomyopathy. No family history of the condition. - Order echocardiogram to assess current cardiac function - Perform lab work to monitor blood counts and liver function - continue GDMT for now  Nonsustained ventricular tachycardia Nonsustained ventricular tachycardia, currently on low dose amiodarone for arrhythmia suppression. Dual chamber ICD interrogation shows minimal ectopy, indicating good control. - Continue current dose of amiodarone - continue BB dose  Paroxysmal atrial fibrillation Paroxysmal atrial fibrillation, on anticoagulation therapy to reduce stroke risk. Device interrogation shows no significant issues, remains asymptomatic. - Continue anticoagulation therapy - CBC and AAD therapy continued AV Paced  Hypertension Hypertension, currently well-controlled and not a significant concern.  Chronic obstructive pulmonary disease (COPD) COPD with 30% lung capacity. No recent respiratory issues or significant exacerbations. Uses oxygen therapy and monitors oxygen levels regularly. - Continue current COPD management and oxygen therapy - Monitor oxygen levels regularly    Riley Lam, MD FASE [facility] Cardiologist [facility]  [address] [place], [address] [place], [place] [address] [phone]  3:57 PM

## 2024-02-17 ENCOUNTER — Other Ambulatory Visit: Payer: Self-pay | Admitting: Cardiology

## 2024-02-26 NOTE — Progress Notes (Signed)
 Remote ICD transmission.

## 2024-03-03 ENCOUNTER — Ambulatory Visit: Payer: Medicare Other | Admitting: Internal Medicine

## 2024-03-05 ENCOUNTER — Ambulatory Visit: Payer: Medicare Other | Admitting: Internal Medicine

## 2024-03-05 ENCOUNTER — Ambulatory Visit: Payer: Self-pay

## 2024-03-05 LAB — HEPATIC FUNCTION PANEL
ALT: 17 IU/L (ref 0–44)
AST: 21 IU/L (ref 0–40)
Albumin: 4.2 g/dL (ref 3.7–4.7)
Alkaline Phosphatase: 66 IU/L (ref 44–121)
Bilirubin Total: 0.3 mg/dL (ref 0.0–1.2)
Bilirubin, Direct: 0.16 mg/dL (ref 0.00–0.40)
Total Protein: 6.7 g/dL (ref 6.0–8.5)

## 2024-03-05 LAB — CBC
Hematocrit: 42.5 % (ref 37.5–51.0)
Hemoglobin: 13.6 g/dL (ref 13.0–17.7)
MCH: 30 pg (ref 26.6–33.0)
MCHC: 32 g/dL (ref 31.5–35.7)
MCV: 94 fL (ref 79–97)
Platelets: 181 10*3/uL (ref 150–450)
RBC: 4.53 x10E6/uL (ref 4.14–5.80)
RDW: 14.6 % (ref 11.6–15.4)
WBC: 4.6 10*3/uL (ref 3.4–10.8)

## 2024-03-05 LAB — TSH: TSH: 4.2 u[IU]/mL (ref 0.450–4.500)

## 2024-03-11 ENCOUNTER — Encounter: Payer: Self-pay | Admitting: Cardiology

## 2024-03-11 ENCOUNTER — Ambulatory Visit: Attending: Cardiology | Admitting: Cardiology

## 2024-03-11 VITALS — BP 114/70 | HR 68 | Ht 68.0 in | Wt 172.0 lb

## 2024-03-11 DIAGNOSIS — I5022 Chronic systolic (congestive) heart failure: Secondary | ICD-10-CM | POA: Insufficient documentation

## 2024-03-11 DIAGNOSIS — I472 Ventricular tachycardia, unspecified: Secondary | ICD-10-CM | POA: Diagnosis not present

## 2024-03-11 DIAGNOSIS — I480 Paroxysmal atrial fibrillation: Secondary | ICD-10-CM | POA: Insufficient documentation

## 2024-03-11 DIAGNOSIS — I442 Atrioventricular block, complete: Secondary | ICD-10-CM | POA: Diagnosis present

## 2024-03-11 DIAGNOSIS — D6869 Other thrombophilia: Secondary | ICD-10-CM | POA: Diagnosis present

## 2024-03-11 LAB — CUP PACEART INCLINIC DEVICE CHECK
Date Time Interrogation Session: 20250520150806
Implantable Lead Connection Status: 753985
Implantable Lead Connection Status: 753985
Implantable Lead Implant Date: 20070328
Implantable Lead Implant Date: 20150323
Implantable Lead Location: 753859
Implantable Lead Location: 753860
Implantable Lead Model: 5076
Implantable Pulse Generator Implant Date: 20230928

## 2024-03-11 NOTE — Progress Notes (Signed)
  Electrophysiology Office Note:   Date:  03/11/2024  ID:  Mike Gray, DOB Jul 09, 1941, MRN [id number]  Primary Cardiologist: Jann Melody, MD Primary Heart Failure: None Electrophysiologist: Ordean Fouts Cortland Ding, MD      History of Present Illness:   Mike Gray is a 83 y.o. male with h/o hypertrophic cardiomyopathy, CHF, atrial fibrillation, hypertension, complete heart block seen today for routine electrophysiology followup.   Since last being seen in our clinic the patient reports doing overall well.  Per his family member, he is working with physical therapy, but aside from that does not get much activity.  Per the patient, he feels well.  He does not have much fatigue.  He is short of breath in the morning prior to taking his Trelegy, once he takes this he is done well without shortness of breath.  he denies chest pain, palpitations, dyspnea, PND, orthopnea, nausea, vomiting, dizziness, syncope, edema, weight gain, or early satiety.   Review of systems complete and found to be negative unless listed in HPI.      EP Information / Studies Reviewed:    EKG is not ordered today. EKG from 01/31/2024 reviewed which showed AV paced      ICD Interrogation-  reviewed in detail today,  See PACEART report.  Device History: Medtronic Dual Chamber ICD implanted for complete heart block/hypertrophic cardiomyopathy History of appropriate therapy: No History of AAD therapy: No   Risk Assessment/Calculations:    CHA2DS2-VASc Score = 4   This indicates a 4.8% annual risk of stroke. The patient's score is based upon: CHF History: 1 HTN History: 1 Diabetes History: 0 Stroke History: 0 Vascular Disease History: 0 Age Score: 2 Gender Score: 0            Physical Exam:   VS:  BP 114/70 (BP Location: Left Arm, Patient Position: Sitting, Cuff Size: Normal)   Pulse 68   Ht 5\' 8"  (1.727 m)   Wt 172 lb (78 kg)   SpO2 94%   BMI 26.15 kg/m    Wt Readings from Last 3  Encounters:  03/11/24 172 lb (78 kg)  01/31/24 172 lb (78 kg)  01/22/24 178 lb (80.7 kg)     GEN: Well nourished, well developed in no acute distress NECK: No JVD; No carotid bruits CARDIAC: Regular rate and rhythm, no murmurs, rubs, gallops RESPIRATORY:  Clear to auscultation without rales, wheezing or rhonchi  ABDOMEN: Soft, non-tender, non-distended EXTREMITIES:  No edema; No deformity   ASSESSMENT AND PLAN:    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  Paroxysmal atrial fibrillation: On amiodarone .  Remains in sinus rhythm.  Continue current management  3.  Secondary hypercoagulable state: On Eliquis  for atrial fibrillation  4.   Nonsustained VT: Minimal on device interrogation  5.  Complete heart block: Post Medtronic dual-chamber ICD.  Functioning appropriately.  Disposition:   Follow up with EP APP in 12 months   Signed, Gerold Sar Cortland Ding, MD

## 2024-03-12 ENCOUNTER — Ambulatory Visit: Payer: Self-pay | Admitting: Cardiology

## 2024-03-20 ENCOUNTER — Other Ambulatory Visit: Payer: Self-pay | Admitting: Cardiology

## 2024-04-04 ENCOUNTER — Other Ambulatory Visit: Payer: Self-pay | Admitting: Internal Medicine

## 2024-04-04 DIAGNOSIS — J439 Emphysema, unspecified: Secondary | ICD-10-CM

## 2024-04-18 ENCOUNTER — Ambulatory Visit (INDEPENDENT_AMBULATORY_CARE_PROVIDER_SITE_OTHER): Payer: Medicare Other

## 2024-04-18 DIAGNOSIS — I442 Atrioventricular block, complete: Secondary | ICD-10-CM | POA: Diagnosis not present

## 2024-04-18 LAB — CUP PACEART REMOTE DEVICE CHECK
Battery Remaining Longevity: 81 mo
Battery Voltage: 3 V
Brady Statistic AP VP Percent: 30.55 %
Brady Statistic AP VS Percent: 0.06 %
Brady Statistic AS VP Percent: 68.67 %
Brady Statistic AS VS Percent: 0.72 %
Brady Statistic RA Percent Paced: 30.32 %
Brady Statistic RV Percent Paced: 99.14 %
Date Time Interrogation Session: 20250627043624
HighPow Impedance: 77 Ohm
Implantable Lead Connection Status: 753985
Implantable Lead Connection Status: 753985
Implantable Lead Implant Date: 20070328
Implantable Lead Implant Date: 20150323
Implantable Lead Location: 753859
Implantable Lead Location: 753860
Implantable Lead Model: 5076
Implantable Pulse Generator Implant Date: 20230928
Lead Channel Impedance Value: 304 Ohm
Lead Channel Impedance Value: 361 Ohm
Lead Channel Impedance Value: 475 Ohm
Lead Channel Pacing Threshold Amplitude: 0.75 V
Lead Channel Pacing Threshold Amplitude: 0.75 V
Lead Channel Pacing Threshold Pulse Width: 0.4 ms
Lead Channel Pacing Threshold Pulse Width: 0.4 ms
Lead Channel Sensing Intrinsic Amplitude: 1.875 mV
Lead Channel Sensing Intrinsic Amplitude: 1.875 mV
Lead Channel Sensing Intrinsic Amplitude: 7 mV
Lead Channel Sensing Intrinsic Amplitude: 7 mV
Lead Channel Setting Pacing Amplitude: 1.5 V
Lead Channel Setting Pacing Amplitude: 2 V
Lead Channel Setting Pacing Pulse Width: 0.4 ms
Lead Channel Setting Sensing Sensitivity: 0.3 mV
Zone Setting Status: 755011
Zone Setting Status: 755011

## 2024-04-20 ENCOUNTER — Ambulatory Visit: Payer: Self-pay | Admitting: Cardiology

## 2024-04-23 ENCOUNTER — Inpatient Hospital Stay (HOSPITAL_COMMUNITY)
Admission: EM | Admit: 2024-04-23 | Discharge: 2024-04-29 | DRG: 871 | Disposition: A | Discharge status: Skilled Nursing Facility | Attending: Internal Medicine | Admitting: Internal Medicine

## 2024-04-23 ENCOUNTER — Other Ambulatory Visit: Payer: Self-pay

## 2024-04-23 ENCOUNTER — Encounter (HOSPITAL_COMMUNITY): Payer: Self-pay | Admitting: Emergency Medicine

## 2024-04-23 ENCOUNTER — Emergency Department (HOSPITAL_COMMUNITY)

## 2024-04-23 DIAGNOSIS — E7800 Pure hypercholesterolemia, unspecified: Secondary | ICD-10-CM | POA: Diagnosis present

## 2024-04-23 DIAGNOSIS — R0602 Shortness of breath: Secondary | ICD-10-CM | POA: Diagnosis not present

## 2024-04-23 DIAGNOSIS — Z66 Do not resuscitate: Secondary | ICD-10-CM | POA: Diagnosis present

## 2024-04-23 DIAGNOSIS — Z7901 Long term (current) use of anticoagulants: Secondary | ICD-10-CM

## 2024-04-23 DIAGNOSIS — Z803 Family history of malignant neoplasm of breast: Secondary | ICD-10-CM

## 2024-04-23 DIAGNOSIS — R6520 Severe sepsis without septic shock: Secondary | ICD-10-CM | POA: Diagnosis present

## 2024-04-23 DIAGNOSIS — A419 Sepsis, unspecified organism: Principal | ICD-10-CM | POA: Diagnosis present

## 2024-04-23 DIAGNOSIS — I4891 Unspecified atrial fibrillation: Secondary | ICD-10-CM | POA: Diagnosis present

## 2024-04-23 DIAGNOSIS — Z7951 Long term (current) use of inhaled steroids: Secondary | ICD-10-CM

## 2024-04-23 DIAGNOSIS — I130 Hypertensive heart and chronic kidney disease with heart failure and stage 1 through stage 4 chronic kidney disease, or unspecified chronic kidney disease: Secondary | ICD-10-CM | POA: Diagnosis present

## 2024-04-23 DIAGNOSIS — J9601 Acute respiratory failure with hypoxia: Secondary | ICD-10-CM | POA: Diagnosis present

## 2024-04-23 DIAGNOSIS — I2489 Other forms of acute ischemic heart disease: Secondary | ICD-10-CM | POA: Diagnosis present

## 2024-04-23 DIAGNOSIS — J181 Lobar pneumonia, unspecified organism: Secondary | ICD-10-CM | POA: Diagnosis present

## 2024-04-23 DIAGNOSIS — I5022 Chronic systolic (congestive) heart failure: Secondary | ICD-10-CM | POA: Diagnosis present

## 2024-04-23 DIAGNOSIS — I422 Other hypertrophic cardiomyopathy: Secondary | ICD-10-CM | POA: Diagnosis present

## 2024-04-23 DIAGNOSIS — E872 Acidosis, unspecified: Secondary | ICD-10-CM | POA: Diagnosis present

## 2024-04-23 DIAGNOSIS — Z95810 Presence of automatic (implantable) cardiac defibrillator: Secondary | ICD-10-CM

## 2024-04-23 DIAGNOSIS — N183 Chronic kidney disease, stage 3 unspecified: Secondary | ICD-10-CM | POA: Diagnosis present

## 2024-04-23 DIAGNOSIS — I480 Paroxysmal atrial fibrillation: Secondary | ICD-10-CM | POA: Diagnosis present

## 2024-04-23 DIAGNOSIS — Z1152 Encounter for screening for COVID-19: Secondary | ICD-10-CM

## 2024-04-23 DIAGNOSIS — Z79899 Other long term (current) drug therapy: Secondary | ICD-10-CM

## 2024-04-23 DIAGNOSIS — J440 Chronic obstructive pulmonary disease with acute lower respiratory infection: Secondary | ICD-10-CM | POA: Diagnosis present

## 2024-04-23 DIAGNOSIS — Z87891 Personal history of nicotine dependence: Secondary | ICD-10-CM

## 2024-04-23 DIAGNOSIS — N401 Enlarged prostate with lower urinary tract symptoms: Secondary | ICD-10-CM | POA: Diagnosis present

## 2024-04-23 DIAGNOSIS — G9341 Metabolic encephalopathy: Secondary | ICD-10-CM | POA: Diagnosis present

## 2024-04-23 DIAGNOSIS — Z8679 Personal history of other diseases of the circulatory system: Secondary | ICD-10-CM

## 2024-04-23 DIAGNOSIS — F319 Bipolar disorder, unspecified: Secondary | ICD-10-CM | POA: Diagnosis present

## 2024-04-23 DIAGNOSIS — J189 Pneumonia, unspecified organism: Secondary | ICD-10-CM | POA: Diagnosis present

## 2024-04-23 DIAGNOSIS — R7989 Other specified abnormal findings of blood chemistry: Secondary | ICD-10-CM

## 2024-04-23 DIAGNOSIS — J441 Chronic obstructive pulmonary disease with (acute) exacerbation: Principal | ICD-10-CM | POA: Diagnosis present

## 2024-04-23 MED ORDER — VANCOMYCIN HCL 1750 MG/350ML IV SOLN
1750.0000 mg | Freq: Once | INTRAVENOUS | Status: AC
  Administered 2024-04-24: 1750 mg via INTRAVENOUS
  Filled 2024-04-23: qty 350

## 2024-04-23 MED ORDER — SODIUM CHLORIDE 0.9 % IV SOLN
2.0000 g | Freq: Once | INTRAVENOUS | Status: AC
  Administered 2024-04-24: 2 g via INTRAVENOUS
  Filled 2024-04-23: qty 12.5

## 2024-04-23 NOTE — ED Triage Notes (Signed)
 Patient arrives via [facility] from home for shortness of breath, increased weakness and increased tremors. Hx of COPD. 10 albuterol  and . 5 Atrovent given PTA.    164/100, P 110, RR 26, SPO2 90%

## 2024-04-23 NOTE — ED Provider Notes (Signed)
 [place] [facility] Provider Note  CSN: [id number] Arrival date & time: 04/23/24 2322  Chief Complaint(s) Shortness of Breath  HPI Mike Gray is a 83 y.o. 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  History obtained from patient's brother who states that over the last 1 week he has had worsening lethargy and weakness and increasing upper and lower extremity tremoring.  Mother states that he has a 2-year history of tremoring but feels that symptoms are little worse today.  Tremors thought to be from previous antipsychotic use.  Patient found to be wheezy by EMS and received 10 of albuterol  and 0.5 mg of Atrovent prior to arrival.  On arrival, patient is alert and answering questions but tremulous.  Denies chest pain, abdominal pain, nausea, vomiting or other systemic symptoms.  Of note, patient arrives febrile and tachycardic.   Past Medical History Past Medical History:  Diagnosis Date   Angiodysplasia    Atrial fibrillation (HCC)    CHF (congestive heart failure) (HCC)    COPD (chronic obstructive pulmonary disease) (HCC)    GI bleed    Hypertension    Presence of permanent cardiac pacemaker    Patient Active Problem List   Diagnosis Date Noted   Sepsis (HCC) 04/24/2024   CKD (chronic kidney disease) stage 3, GFR 30-59 ml/min (HCC) 04/24/2024   Elevated troponin 04/24/2024   Long term (current) use of anticoagulants 11/20/2022   Lobar pneumonia (HCC) 08/02/2021   PAF (paroxysmal atrial fibrillation) (HCC) 07/19/2021   Increased ammonia level 07/19/2021   Paroxysmal ventricular tachycardia (HCC) 03/22/2021   Left ear impacted cerumen 03/08/2021   Benign prostatic hyperplasia with lower urinary tract symptoms 12/07/2020   Hearing loss  12/07/2020   Hypertensive heart failure (HCC) 12/07/2020   Pure hypercholesterolemia 12/07/2020   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 (HCC) 07/05/2020   COPD with acute exacerbation (HCC) 07/05/2020   Bipolar 1 disorder (HCC) 07/05/2020   Unspecified atrial fibrillation (HCC) 07/05/2020   Chronic systolic CHF (congestive heart failure) (HCC) 07/05/2020   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   History of complete heart block 07/05/2020   ICD (implantable cardioverter-defibrillator) in place 07/05/2020   Acute metabolic encephalopathy 07/04/2020   Home Medication(s) Prior to Admission medications   Medication Sig Start Date End Date Taking? Authorizing Provider  albuterol  (VENTOLIN  HFA) 108 (90 Base) MCG/ACT inhaler INHALE 2 PUFFS BY MOUTH EVERY 6 HOURS AS NEEDED 12/21/23   Desai, Nikita S, MD  amiodarone  (PACERONE ) 200 MG tablet TAKE 1/2 (ONE-HALF) TABLET BY MOUTH ONCE DAILY . APPOINTMENT REQUIRED FOR FUTURE REFILLS 03/20/24   Camnitz, Soyla Lunger, MD  apixaban  (ELIQUIS ) 5 MG TABS tablet Take 1 tablet (5 mg total) by mouth 2 (two) times daily. 07/11/21   Camnitz, Soyla Lunger, MD  atorvastatin  (LIPITOR) 20 MG tablet Take 20 mg by mouth at bedtime.    [provider]  divalproex  (DEPAKOTE  ER) 500 MG 24 hr tablet Take 1,000 mg by mouth daily.  10/20/19   [provider]  finasteride  (PROSCAR ) 5 MG tablet Take 5 mg by mouth at bedtime. 07/15/20   [provider]  Fluticasone-Umeclidin-Vilant (TRELEGY ELLIPTA ) 100-62.5-25 MCG/ACT AEPB Inhale 1 puff into the lungs daily. 02/13/23   Desai, Nikita S, MD  furosemide  (LASIX ) 20  MG tablet Take 1 tablet (20 mg total) by mouth daily as needed for edema. 10/03/23   Camnitz, Soyla Lunger, MD  metoprolol  succinate (TOPROL -XL) 100 MG 24 hr tablet Take 1 tablet (100 mg total) by mouth daily. Take with or immediately following a meal. Take along with 50 mg (Total 150 mg) 05/08/23    Camnitz, Soyla Lunger, MD  metoprolol  succinate (TOPROL -XL) 50 MG 24 hr tablet Take 1 tablet (50 mg total) by mouth daily. Take with or immediately following a meal. Take along with 100 mg (Total 150 mg) 05/08/23   Camnitz, Soyla Lunger, MD  tamsulosin  (FLOMAX ) 0.4 MG CAPS capsule Take 1 capsule (0.4 mg total) by mouth daily after supper. Patient taking differently: Take 0.8 mg by mouth daily after supper. 07/09/20   Bryn Bernardino NOVAK, MD  TRELEGY ELLIPTA  100-62.5-25 MCG/ACT AEPB INHALE 1 PUFF ONCE DAILY . APPOINTMENT REQUIRED FOR FUTURE REFILLS 04/07/24   Meade Verdon RAMAN, MD                                                                                                                                    Past Surgical History Past Surgical History:  Procedure Laterality Date   COLONOSCOPY     ICD GENERATOR CHANGEOUT N/A 07/20/2022   Procedure: ICD GENERATOR CHANGEOUT;  Surgeon: Inocencio Soyla Lunger, MD;  Location: [facility] INVASIVE CV LAB;  Service: Cardiovascular;  Laterality: N/A;   INGUINAL HERNIA REPAIR Right 12/14/2022   Procedure: OPEN RIGHT HERNIA REPAIR INGUINAL ADULT WITH MESH;  Surgeon: Rubin Calamity, MD;  Location: [facility] OR;  Service: General;  Laterality: Right;   PACEMAKER IMPLANT     Family History Family History  Problem Relation Age of Onset   Healthy Mother    Breast cancer Father 70   Cancer Other     Social History Social History   Tobacco Use   Smoking status: Former    Current packs/day: 0.00    Average packs/day: 1 pack/day for 40.0 years (40.0 ttl pk-yrs)    Types: Cigarettes    Start date: 06/1981    Quit date: 06/2021    Years since quitting: 2.8   Smokeless tobacco: Never  Vaping Use   Vaping status: Never Used  Substance Use Topics   Alcohol use: Yes    Alcohol/week: 1.0 standard drink of alcohol    Types: 1 Cans of beer per week   Drug use: Never   Allergies Patient has no known allergies.  Review of Systems Review of Systems  Constitutional:  Positive for  fever.  Respiratory:  Positive for cough, shortness of breath and wheezing.   Neurological:  Positive for tremors.  Psychiatric/Behavioral:  Positive for confusion.     Physical Exam Vital Signs  I have reviewed the triage vital signs BP (!) 99/57   Pulse 96   Temp 98.5 F (36.9 C) (Oral)   Resp 20   Ht 5' 8 (1.727  m)   Wt 78 kg   SpO2 100%   BMI 26.15 kg/m   Physical Exam Vitals and nursing note reviewed.  Constitutional:      General: He is not in acute distress.    Appearance: He is well-developed. He is ill-appearing.  HENT:     Head: Normocephalic and atraumatic.  Eyes:     Conjunctiva/sclera: Conjunctivae normal.  Cardiovascular:     Rate and Rhythm: Normal rate and regular rhythm.     Heart sounds: No murmur heard. Pulmonary:     Effort: Pulmonary effort is normal. Tachypnea present. No respiratory distress.     Breath sounds: Wheezing present.  Abdominal:     Palpations: Abdomen is soft.     Tenderness: There is no abdominal tenderness.  Musculoskeletal:        General: No swelling.     Cervical back: Neck supple.  Skin:    General: Skin is warm and dry.     Capillary Refill: Capillary refill takes less than 2 seconds.     Comments: Tremoring  Neurological:     Mental Status: He is alert.  Psychiatric:        Mood and Affect: Mood normal.     ED Results and Treatments Labs (all labs ordered are listed, but only abnormal results are displayed) Labs Reviewed  BRAIN NATRIURETIC PEPTIDE - Abnormal; Notable for the following components:      Result Value   B Natriuretic Peptide 798.5 (*)    All other components within normal limits  CBC WITH DIFFERENTIAL/PLATELET - Abnormal; Notable for the following components:   Platelets 123 (*)    All other components within normal limits  COMPREHENSIVE METABOLIC PANEL WITH GFR - Abnormal; Notable for the following components:   Glucose, Bld 125 (*)    BUN 32 (*)    Creatinine, Ser 1.68 (*)    GFR, Estimated  40 (*)    All other components within normal limits  CBC WITH DIFFERENTIAL/PLATELET - Abnormal; Notable for the following components:   Hemoglobin 12.8 (*)    Platelets 129 (*)    Neutro Abs 8.8 (*)    Lymphs Abs 0.4 (*)    All other components within normal limits  I-STAT CG4 LACTIC ACID, ED - Abnormal; Notable for the following components:   Lactic Acid, Venous 2.2 (*)    All other components within normal limits  CBG MONITORING, ED - Abnormal; Notable for the following components:   Glucose-Capillary 114 (*)    All other components within normal limits  I-STAT CG4 LACTIC ACID, ED - Abnormal; Notable for the following components:   Lactic Acid, Venous 3.1 (*)    All other components within normal limits  TROPONIN I (HIGH SENSITIVITY) - Abnormal; Notable for the following components:   Troponin I (High Sensitivity) 128 (*)    All other components within normal limits  TROPONIN I (HIGH SENSITIVITY) - Abnormal; Notable for the following components:   Troponin I (High Sensitivity) 150 (*)    All other components within normal limits  RESP PANEL BY RT-PCR (RSV, FLU A&B, COVID)  RVPGX2  CULTURE, BLOOD (ROUTINE X 2)  CULTURE, BLOOD (ROUTINE X 2)  BLOOD GAS, VENOUS  BASIC METABOLIC PANEL WITH GFR  HEPATIC FUNCTION PANEL  MAGNESIUM   TSH  VALPROIC ACID LEVEL  AMMONIA  TROPONIN I (HIGH SENSITIVITY)  Radiology CT CHEST ABDOMEN PELVIS W CONTRAST Result Date: 04/24/2024 CLINICAL DATA:  Sepsis EXAM: CT CHEST, ABDOMEN, AND PELVIS WITH CONTRAST TECHNIQUE: Multidetector CT imaging of the chest, abdomen and pelvis was performed following the standard protocol during bolus administration of intravenous contrast. RADIATION DOSE REDUCTION: This exam was performed according to the departmental dose-optimization program which includes automated exposure control, adjustment of the mA  and/or kV according to patient size and/or use of iterative reconstruction technique. CONTRAST:  75mL OMNIPAQUE  IOHEXOL  350 MG/ML SOLN COMPARISON:  CT chest 07/23/2021 radiograph 04/23/2024 CT abdomen pelvis 07/05/2020 FINDINGS: CT CHEST FINDINGS Cardiovascular: Cardiomegaly. No pericardial effusion. Left chest wall ICD. Aortic and coronary artery atherosclerotic calcification. No aortic dissection. Mediastinum/Nodes: Trachea and esophagus are unremarkable. No thoracic adenopathy. Lungs/Pleura: Emphysema. Respiratory motion obscures detail. Infiltrates in the left lower lobe due to pneumonia and/or aspiration. No pleural effusion or pneumothorax. Musculoskeletal: No acute fracture. CT ABDOMEN PELVIS FINDINGS Hepatobiliary: Cholelithiasis. No evidence of acute cholecystitis. No biliary dilation. Unremarkable liver. Pancreas: Unremarkable. Spleen: Unremarkable. Adrenals/Urinary Tract: Normal adrenal glands. Punctate nonobstructing stones in both kidneys. No hydronephrosis. Unremarkable bladder. Stomach/Bowel: Normal caliber large and small bowel. Colonic diverticulosis without diverticulitis. Normal appendix. Stomach is within normal limits. Vascular/Lymphatic: Aortic atherosclerosis. No enlarged abdominal or pelvic lymph nodes. Reproductive: Enlarged prostate. Other: No free intraperitoneal air.  Trace free fluid in the pelvis. Musculoskeletal: No acute fracture. IMPRESSION: 1. Left lower lobe pneumonia and/or aspiration. 2. No acute abnormality in the abdomen or pelvis. 3. Cholelithiasis. 4. Punctate nonobstructing stones in both kidneys. 5. Prostatomegaly. Aortic Atherosclerosis (ICD10-I70.0) and Emphysema (ICD10-J43.9). Electronically Signed   By: Norman Gatlin M.D.   On: 04/24/2024 02:30   DG Chest 1 View Result Date: 04/24/2024 CLINICAL DATA:  Shortness of breath EXAM: CHEST  1 VIEW COMPARISON:  Radiograph 08/02/2021 FINDINGS: Stable cardiomegaly. Left chest wall ICD. Aortic atherosclerotic calcification.  Prominent central pulmonary arteries. The left costophrenic angle is not included in the image. No focal consolidation, pleural effusion, or pneumothorax. IMPRESSION: No acute disease.  Cardiomegaly. Electronically Signed   By: Norman Gatlin M.D.   On: 04/24/2024 00:25    Pertinent labs & imaging results that were available during my care of the patient were reviewed by me and considered in my medical decision making (see MDM for details).  Medications Ordered in ED Medications  amiodarone  (PACERONE ) tablet 100 mg (has no administration in time range)  atorvastatin  (LIPITOR) tablet 20 mg (has no administration in time range)  finasteride  (PROSCAR ) tablet 5 mg (has no administration in time range)  tamsulosin  (FLOMAX ) capsule 0.8 mg (has no administration in time range)  apixaban  (ELIQUIS ) tablet 5 mg (has no administration in time range)  acetaminophen  (TYLENOL ) tablet 650 mg (has no administration in time range)    Or  acetaminophen  (TYLENOL ) suppository 650 mg (has no administration in time range)  cefTRIAXone  (ROCEPHIN ) 2 g in sodium chloride  0.9 % 100 mL IVPB (has no administration in time range)  doxycycline (VIBRAMYCIN) 100 mg in sodium chloride  0.9 % 250 mL IVPB (has no administration in time range)  ipratropium-albuterol  (DUONEB) 0.5-2.5 (3) MG/3ML nebulizer solution 3 mL (has no administration in time range)  ipratropium-albuterol  (DUONEB) 0.5-2.5 (3) MG/3ML nebulizer solution 3 mL (has no administration in time range)  budesonide  (PULMICORT ) nebulizer solution 0.25 mg (has no administration in time range)  divalproex  (DEPAKOTE  ER) 24 hr tablet 1,000 mg (has no administration in time range)  ceFEPIme (MAXIPIME) 2 g in sodium chloride  0.9 % 100 mL IVPB (0 g  Intravenous Stopped 04/24/24 0041)  vancomycin (VANCOREADY) IVPB 1750 mg/350 mL (0 mg Intravenous Stopped 04/24/24 0240)  lactated ringers  bolus 1,000 mL (0 mLs Intravenous Stopped 04/24/24 0059)  ipratropium-albuterol  (DUONEB) 0.5-2.5  (3) MG/3ML nebulizer solution 6 mL (6 mLs Nebulization Given 04/24/24 0020)  acetaminophen  (TYLENOL ) tablet 1,000 mg (1,000 mg Oral Given 04/24/24 0030)  LORazepam (ATIVAN) injection 0.5 mg (0.5 mg Intravenous Given 04/24/24 0037)  methylPREDNISolone  sodium succinate (SOLU-MEDROL ) 125 mg/2 mL injection 125 mg (125 mg Intravenous Given 04/24/24 0057)  aspirin chewable tablet 324 mg (324 mg Oral Given 04/24/24 0121)  iohexol  (OMNIPAQUE ) 350 MG/ML injection 75 mL (75 mLs Intravenous Contrast Given 04/24/24 0207)  lactated ringers  bolus 1,000 mL (0 mLs Intravenous Stopped 04/24/24 0400)                                                                                                                                     Procedures .Critical Care  Performed by: Albertina Dixon, MD Authorized by: Albertina Dixon, MD   Critical care provider statement:    Critical care time (minutes):  30   Critical care was necessary to treat or prevent imminent or life-threatening deterioration of the following conditions:  Respiratory failure and sepsis   Critical care was time spent personally by me on the following activities:  Development of treatment plan with patient or surrogate, discussions with consultants, evaluation of patient's response to treatment, examination of patient, ordering and review of laboratory studies, ordering and review of radiographic studies, ordering and performing treatments and interventions, pulse oximetry, re-evaluation of patient's condition and review of old charts   (including critical care time)  Medical Decision Making / ED Course   This patient presents to the ED for concern of shortness of breath, this involves an extensive number of treatment options, and is a complaint that carries with it a high risk of complications and morbidity.  The differential diagnosis includes Pe, PTX, Pulmonary Edema, COPD/Asthma, ACS, CHF exacerbation, Arrhythmia,  Anemia, Sepsis, Anxiety, Viral  UR  MDM: Patient seen emergency room for evaluation of shortness of breath.  Physical exam reveals a regular tachycardia, extensive wheezing bilaterally, upper extremity tremoring.  Laboratory evaluation with a creatinine of 1.68, BUN 32, no significant leukocytosis but lactic acid 2.2, high sensitive troponin 128, BNP 798, COVID, flu, RSV negative.  Initial chest x-ray unremarkable.  Initial ECG showing a rapid wide-complex tachycardia but previous ECGs show AV paced rhythm and do suspect that this is likely just a rapid paced rhythm.  I spoke with the cardiologist on-call Dr. Marylu who came to evaluate the patient at bedside and interrogated the patient's pacemaker.  Patient is in sinus rhythm, sinus tachycardia.  Suspect that this is likely iatrogenic from albuterol  therapy by EMS.  Patient did receive 2 additional DuoNebs and methylprednisolone  for suspected persistent COPD exacerbation.  Patient does meet sepsis criteria on arrival and broad-spectrum antibiotics started immediately as well  as fluid resuscitation.  We did not do 30 cc/kg given patient's decreased EF to 35%.  CT chest abdomen pelvis showing left lower lobe pneumonia with concern for possible aspiration.  Patient require hospital admission for pneumonia and COPD exacerbation.  Patient admitted  Of note, I did have an extensive discussion with the patient's brother who is patient's healthcare power of attorney.  Patient is a DNR/DNI and this was updated in the chart.   Additional history obtained: -Additional history obtained from brother -External records from outside source obtained and reviewed including: Chart review including previous notes, labs, imaging, consultation notes   Lab Tests: -I ordered, reviewed, and interpreted labs.   The pertinent results include:   Labs Reviewed  BRAIN NATRIURETIC PEPTIDE - Abnormal; Notable for the following components:      Result Value   B Natriuretic Peptide 798.5 (*)    All other  components within normal limits  CBC WITH DIFFERENTIAL/PLATELET - Abnormal; Notable for the following components:   Platelets 123 (*)    All other components within normal limits  COMPREHENSIVE METABOLIC PANEL WITH GFR - Abnormal; Notable for the following components:   Glucose, Bld 125 (*)    BUN 32 (*)    Creatinine, Ser 1.68 (*)    GFR, Estimated 40 (*)    All other components within normal limits  CBC WITH DIFFERENTIAL/PLATELET - Abnormal; Notable for the following components:   Hemoglobin 12.8 (*)    Platelets 129 (*)    Neutro Abs 8.8 (*)    Lymphs Abs 0.4 (*)    All other components within normal limits  I-STAT CG4 LACTIC ACID, ED - Abnormal; Notable for the following components:   Lactic Acid, Venous 2.2 (*)    All other components within normal limits  CBG MONITORING, ED - Abnormal; Notable for the following components:   Glucose-Capillary 114 (*)    All other components within normal limits  I-STAT CG4 LACTIC ACID, ED - Abnormal; Notable for the following components:   Lactic Acid, Venous 3.1 (*)    All other components within normal limits  TROPONIN I (HIGH SENSITIVITY) - Abnormal; Notable for the following components:   Troponin I (High Sensitivity) 128 (*)    All other components within normal limits  TROPONIN I (HIGH SENSITIVITY) - Abnormal; Notable for the following components:   Troponin I (High Sensitivity) 150 (*)    All other components within normal limits  RESP PANEL BY RT-PCR (RSV, FLU A&B, COVID)  RVPGX2  CULTURE, BLOOD (ROUTINE X 2)  CULTURE, BLOOD (ROUTINE X 2)  BLOOD GAS, VENOUS  BASIC METABOLIC PANEL WITH GFR  HEPATIC FUNCTION PANEL  MAGNESIUM   TSH  VALPROIC ACID LEVEL  AMMONIA  TROPONIN I (HIGH SENSITIVITY)      EKG   EKG Interpretation Date/Time:  Wednesday April 23 2024 23:45:57 EDT Ventricular Rate:  165 PR Interval:  153 QRS Duration:  136 QT Interval:  328 QTC Calculation: 473 R Axis:   11  Text Interpretation: Wide-QRS  tachycardia Ventricular tachycardia, unsustained IVCD, consider atypical RBBB Artifact in lead(s) I II aVR When compared with ECG of 01/31/2024, Wide QRS tachycardia has replaced AV dual-paced rhythm Confirmed by Raford Lenis ([id number]) on 04/24/2024 4:39:53 AM         Imaging Studies ordered: I ordered imaging studies including chest x-ray, CT chest abdomen pelvis I independently visualized and interpreted imaging. I agree with the radiologist interpretation   Medicines ordered and prescription drug management: Meds ordered this encounter  Medications   ceFEPIme (MAXIPIME) 2 g in sodium chloride  0.9 % 100 mL IVPB    Antibiotic Indication::   Sepsis   vancomycin (VANCOREADY) IVPB 1750 mg/350 mL    Indication::   Sepsis   DISCONTD: morphine (PF) 4 MG/ML injection 4 mg   DISCONTD: ondansetron  (ZOFRAN ) injection 4 mg   lactated ringers  bolus 1,000 mL   ipratropium-albuterol  (DUONEB) 0.5-2.5 (3) MG/3ML nebulizer solution 6 mL   acetaminophen  (TYLENOL ) tablet 1,000 mg   LORazepam (ATIVAN) injection 0.5 mg   methylPREDNISolone  sodium succinate (SOLU-MEDROL ) 125 mg/2 mL injection 125 mg   aspirin chewable tablet 324 mg   iohexol  (OMNIPAQUE ) 350 MG/ML injection 75 mL   lactated ringers  bolus 1,000 mL   amiodarone  (PACERONE ) tablet 100 mg   atorvastatin  (LIPITOR) tablet 20 mg   finasteride  (PROSCAR ) tablet 5 mg   tamsulosin  (FLOMAX ) capsule 0.8 mg   apixaban  (ELIQUIS ) tablet 5 mg   OR Linked Order Group    acetaminophen  (TYLENOL ) tablet 650 mg    acetaminophen  (TYLENOL ) suppository 650 mg   cefTRIAXone  (ROCEPHIN ) 2 g in sodium chloride  0.9 % 100 mL IVPB    Antibiotic Indication::   CAP   doxycycline (VIBRAMYCIN) 100 mg in sodium chloride  0.9 % 250 mL IVPB    Antibiotic Indication::   CAP   ipratropium-albuterol  (DUONEB) 0.5-2.5 (3) MG/3ML nebulizer solution 3 mL   ipratropium-albuterol  (DUONEB) 0.5-2.5 (3) MG/3ML nebulizer solution 3 mL   budesonide  (PULMICORT ) nebulizer solution 0.25 mg    divalproex  (DEPAKOTE  ER) 24 hr tablet 1,000 mg    -I have reviewed the patients home medicines and have made adjustments as needed  Critical interventions Supplemental oxygen, multiple DuoNebs, broad-spectrum antibiotics   Cardiac Monitoring: The patient was maintained on a cardiac monitor.  I personally viewed and interpreted the cardiac monitored which showed an underlying rhythm of: Paced rhythm  Social Determinants of Health:  Factors impacting patients care include: none   Reevaluation: After the interventions noted above, I reevaluated the patient and found that they have :improved  Co morbidities that complicate the patient evaluation  Past Medical History:  Diagnosis Date   Angiodysplasia    Atrial fibrillation (HCC)    CHF (congestive heart failure) (HCC)    COPD (chronic obstructive pulmonary disease) (HCC)    GI bleed    Hypertension    Presence of permanent cardiac pacemaker       Dispostion: I considered admission for this patient, and patient require hospital admission for pneumonia, COPD exacerbation     Final Clinical Impression(s) / ED Diagnoses Final diagnoses:  COPD exacerbation (HCC)     @PCDICTATION @    Albertina Dixon, MD 04/24/24 [phone]

## 2024-04-23 NOTE — Progress Notes (Signed)
 ED Pharmacy Antibiotic Sign Off An antibiotic consult was received from an ED provider for cefepime/vancomycin per pharmacy dosing for sepsis. A chart review was completed to assess appropriateness.   The following one time order(s) were placed:   -Cefepime 2g IV x1 -Vancomycin 1750mg  IV x1  Further antibiotic and/or antibiotic pharmacy consults should be ordered by the admitting provider if indicated.   Thank you for allowing pharmacy to be a part of this patient's care.   Lynwood Poplar, [facility]  Clinical Pharmacist 04/23/24 11:54 PM

## 2024-04-24 ENCOUNTER — Inpatient Hospital Stay (HOSPITAL_COMMUNITY)

## 2024-04-24 ENCOUNTER — Emergency Department (HOSPITAL_COMMUNITY)

## 2024-04-24 ENCOUNTER — Encounter (HOSPITAL_COMMUNITY): Payer: Self-pay | Admitting: Internal Medicine

## 2024-04-24 DIAGNOSIS — I2489 Other forms of acute ischemic heart disease: Secondary | ICD-10-CM | POA: Diagnosis present

## 2024-04-24 DIAGNOSIS — N183 Chronic kidney disease, stage 3 unspecified: Secondary | ICD-10-CM | POA: Insufficient documentation

## 2024-04-24 DIAGNOSIS — G9341 Metabolic encephalopathy: Secondary | ICD-10-CM | POA: Diagnosis present

## 2024-04-24 DIAGNOSIS — R6520 Severe sepsis without septic shock: Secondary | ICD-10-CM | POA: Diagnosis present

## 2024-04-24 DIAGNOSIS — E7800 Pure hypercholesterolemia, unspecified: Secondary | ICD-10-CM | POA: Diagnosis present

## 2024-04-24 DIAGNOSIS — I5022 Chronic systolic (congestive) heart failure: Secondary | ICD-10-CM

## 2024-04-24 DIAGNOSIS — Z7901 Long term (current) use of anticoagulants: Secondary | ICD-10-CM | POA: Diagnosis not present

## 2024-04-24 DIAGNOSIS — Z1152 Encounter for screening for COVID-19: Secondary | ICD-10-CM | POA: Diagnosis not present

## 2024-04-24 DIAGNOSIS — A419 Sepsis, unspecified organism: Secondary | ICD-10-CM | POA: Diagnosis present

## 2024-04-24 DIAGNOSIS — J181 Lobar pneumonia, unspecified organism: Secondary | ICD-10-CM

## 2024-04-24 DIAGNOSIS — J9601 Acute respiratory failure with hypoxia: Secondary | ICD-10-CM | POA: Diagnosis present

## 2024-04-24 DIAGNOSIS — J441 Chronic obstructive pulmonary disease with (acute) exacerbation: Secondary | ICD-10-CM | POA: Diagnosis not present

## 2024-04-24 DIAGNOSIS — R7989 Other specified abnormal findings of blood chemistry: Secondary | ICD-10-CM | POA: Diagnosis not present

## 2024-04-24 DIAGNOSIS — R0602 Shortness of breath: Secondary | ICD-10-CM

## 2024-04-24 DIAGNOSIS — Z66 Do not resuscitate: Secondary | ICD-10-CM | POA: Diagnosis present

## 2024-04-24 DIAGNOSIS — E872 Acidosis, unspecified: Secondary | ICD-10-CM | POA: Diagnosis present

## 2024-04-24 DIAGNOSIS — J189 Pneumonia, unspecified organism: Secondary | ICD-10-CM | POA: Diagnosis present

## 2024-04-24 DIAGNOSIS — J440 Chronic obstructive pulmonary disease with acute lower respiratory infection: Secondary | ICD-10-CM | POA: Diagnosis present

## 2024-04-24 DIAGNOSIS — Z87891 Personal history of nicotine dependence: Secondary | ICD-10-CM | POA: Diagnosis not present

## 2024-04-24 DIAGNOSIS — Z95810 Presence of automatic (implantable) cardiac defibrillator: Secondary | ICD-10-CM | POA: Diagnosis not present

## 2024-04-24 DIAGNOSIS — I480 Paroxysmal atrial fibrillation: Secondary | ICD-10-CM | POA: Diagnosis present

## 2024-04-24 DIAGNOSIS — I422 Other hypertrophic cardiomyopathy: Secondary | ICD-10-CM | POA: Diagnosis present

## 2024-04-24 DIAGNOSIS — N401 Enlarged prostate with lower urinary tract symptoms: Secondary | ICD-10-CM | POA: Diagnosis present

## 2024-04-24 DIAGNOSIS — Z79899 Other long term (current) drug therapy: Secondary | ICD-10-CM | POA: Diagnosis not present

## 2024-04-24 DIAGNOSIS — Z803 Family history of malignant neoplasm of breast: Secondary | ICD-10-CM | POA: Diagnosis not present

## 2024-04-24 DIAGNOSIS — I130 Hypertensive heart and chronic kidney disease with heart failure and stage 1 through stage 4 chronic kidney disease, or unspecified chronic kidney disease: Secondary | ICD-10-CM | POA: Diagnosis present

## 2024-04-24 DIAGNOSIS — Z7951 Long term (current) use of inhaled steroids: Secondary | ICD-10-CM | POA: Diagnosis not present

## 2024-04-24 DIAGNOSIS — F319 Bipolar disorder, unspecified: Secondary | ICD-10-CM | POA: Diagnosis present

## 2024-04-24 LAB — COMPREHENSIVE METABOLIC PANEL WITH GFR
ALT: 22 U/L (ref 0–44)
AST: 39 U/L (ref 15–41)
Albumin: 3.8 g/dL (ref 3.5–5.0)
Alkaline Phosphatase: 52 U/L (ref 38–126)
Anion gap: 12 (ref 5–15)
BUN: 32 mg/dL — ABNORMAL HIGH (ref 8–23)
CO2: 24 mmol/L (ref 22–32)
Calcium: 9.9 mg/dL (ref 8.9–10.3)
Chloride: 101 mmol/L (ref 98–111)
Creatinine, Ser: 1.68 mg/dL — ABNORMAL HIGH (ref 0.61–1.24)
GFR, Estimated: 40 mL/min — ABNORMAL LOW (ref >60)
Glucose, Bld: 125 mg/dL — ABNORMAL HIGH (ref 70–99)
Potassium: 4.2 mmol/L (ref 3.5–5.1)
Sodium: 137 mmol/L (ref 135–145)
Total Bilirubin: 1.1 mg/dL (ref 0.0–1.2)
Total Protein: 7.8 g/dL (ref 6.5–8.1)

## 2024-04-24 LAB — ECHOCARDIOGRAM COMPLETE
Area-P 1/2: 3.27 cm2
Height: 68 in
S' Lateral: 5 cm
Single Plane A4C EF: 46.5 %
Weight: 2751.34 [oz_av]

## 2024-04-24 LAB — BASIC METABOLIC PANEL WITH GFR
Anion gap: 9 (ref 5–15)
BUN: 31 mg/dL — ABNORMAL HIGH (ref 8–23)
CO2: 21 mmol/L — ABNORMAL LOW (ref 22–32)
Calcium: 9 mg/dL (ref 8.9–10.3)
Chloride: 104 mmol/L (ref 98–111)
Creatinine, Ser: 1.77 mg/dL — ABNORMAL HIGH (ref 0.61–1.24)
GFR, Estimated: 38 mL/min — ABNORMAL LOW (ref >60)
Glucose, Bld: 171 mg/dL — ABNORMAL HIGH (ref 70–99)
Potassium: 4.2 mmol/L (ref 3.5–5.1)
Sodium: 134 mmol/L — ABNORMAL LOW (ref 135–145)

## 2024-04-24 LAB — HEPATIC FUNCTION PANEL
ALT: 16 U/L (ref 0–44)
AST: 31 U/L (ref 15–41)
Albumin: 2.8 g/dL — ABNORMAL LOW (ref 3.5–5.0)
Alkaline Phosphatase: 38 U/L (ref 38–126)
Bilirubin, Direct: 0.2 mg/dL (ref 0.0–0.2)
Indirect Bilirubin: 0.6 mg/dL (ref 0.3–0.9)
Total Bilirubin: 0.8 mg/dL (ref 0.0–1.2)
Total Protein: 6.1 g/dL — ABNORMAL LOW (ref 6.5–8.1)

## 2024-04-24 LAB — CBC WITH DIFFERENTIAL/PLATELET
Abs Immature Granulocytes: 0.02 10*3/uL (ref 0.00–0.07)
Abs Immature Granulocytes: 0.03 10*3/uL (ref 0.00–0.07)
Basophils Absolute: 0 10*3/uL (ref 0.0–0.1)
Basophils Absolute: 0 10*3/uL (ref 0.0–0.1)
Basophils Relative: 0 %
Basophils Relative: 0 %
Eosinophils Absolute: 0 10*3/uL (ref 0.0–0.5)
Eosinophils Absolute: 0 10*3/uL (ref 0.0–0.5)
Eosinophils Relative: 0 %
Eosinophils Relative: 0 %
HCT: 39.8 % (ref 39.0–52.0)
HCT: 48 % (ref 39.0–52.0)
Hemoglobin: 12.8 g/dL — ABNORMAL LOW (ref 13.0–17.0)
Hemoglobin: 15.4 g/dL (ref 13.0–17.0)
Immature Granulocytes: 0 %
Immature Granulocytes: 0 %
Lymphocytes Relative: 20 %
Lymphocytes Relative: 4 %
Lymphs Abs: 0.4 10*3/uL — ABNORMAL LOW (ref 0.7–4.0)
Lymphs Abs: 1.2 10*3/uL (ref 0.7–4.0)
MCH: 29.2 pg (ref 26.0–34.0)
MCH: 29.5 pg (ref 26.0–34.0)
MCHC: 32.1 g/dL (ref 30.0–36.0)
MCHC: 32.2 g/dL (ref 30.0–36.0)
MCV: 91.1 fL (ref 80.0–100.0)
MCV: 91.7 fL (ref 80.0–100.0)
Monocytes Absolute: 0.4 10*3/uL (ref 0.1–1.0)
Monocytes Absolute: 0.5 10*3/uL (ref 0.1–1.0)
Monocytes Relative: 4 %
Monocytes Relative: 9 %
Neutro Abs: 4.2 10*3/uL (ref 1.7–7.7)
Neutro Abs: 8.8 10*3/uL — ABNORMAL HIGH (ref 1.7–7.7)
Neutrophils Relative %: 71 %
Neutrophils Relative %: 92 %
Platelets: 123 10*3/uL — ABNORMAL LOW (ref 150–400)
Platelets: 129 10*3/uL — ABNORMAL LOW (ref 150–400)
RBC: 4.34 MIL/uL (ref 4.22–5.81)
RBC: 5.27 MIL/uL (ref 4.22–5.81)
RDW: 15 % (ref 11.5–15.5)
RDW: 15.1 % (ref 11.5–15.5)
WBC: 5.9 10*3/uL (ref 4.0–10.5)
WBC: 9.6 10*3/uL (ref 4.0–10.5)
nRBC: 0 % (ref 0.0–0.2)
nRBC: 0 % (ref 0.0–0.2)

## 2024-04-24 LAB — RESP PANEL BY RT-PCR (RSV, FLU A&B, COVID)  RVPGX2
Influenza A by PCR: NEGATIVE
Influenza B by PCR: NEGATIVE
Resp Syncytial Virus by PCR: NEGATIVE
SARS Coronavirus 2 by RT PCR: NEGATIVE

## 2024-04-24 LAB — TROPONIN I (HIGH SENSITIVITY)
Troponin I (High Sensitivity): 128 ng/L (ref <18)
Troponin I (High Sensitivity): 150 ng/L (ref <18)
Troponin I (High Sensitivity): 202 ng/L (ref <18)
Troponin I (High Sensitivity): 176 ng/L (ref <18)

## 2024-04-24 LAB — BLOOD GAS, VENOUS
Acid-Base Excess: 0.8 mmol/L (ref 0.0–2.0)
Bicarbonate: 26.6 mmol/L (ref 20.0–28.0)
O2 Saturation: 57.5 %
Patient temperature: 39.2
pCO2, Ven: 51 mmHg (ref 44–60)
pH, Ven: 7.34 (ref 7.25–7.43)
pO2, Ven: 40 mmHg (ref 32–45)

## 2024-04-24 LAB — AMMONIA: Ammonia: 13 umol/L (ref 9–35)

## 2024-04-24 LAB — I-STAT CG4 LACTIC ACID, ED
Lactic Acid, Venous: 2.2 mmol/L (ref 0.5–1.9)
Lactic Acid, Venous: 3.1 mmol/L (ref 0.5–1.9)

## 2024-04-24 LAB — CBG MONITORING, ED: Glucose-Capillary: 114 mg/dL — ABNORMAL HIGH (ref 70–99)

## 2024-04-24 LAB — VALPROIC ACID LEVEL: Valproic Acid Lvl: 59 ug/mL (ref 50–100)

## 2024-04-24 LAB — TSH: TSH: 1.586 u[IU]/mL (ref 0.350–4.500)

## 2024-04-24 LAB — BRAIN NATRIURETIC PEPTIDE: B Natriuretic Peptide: 798.5 pg/mL — ABNORMAL HIGH (ref 0.0–100.0)

## 2024-04-24 LAB — MAGNESIUM: Magnesium: 1.6 mg/dL — ABNORMAL LOW (ref 1.7–2.4)

## 2024-04-24 MED ORDER — IOHEXOL 350 MG/ML SOLN
75.0000 mL | Freq: Once | INTRAVENOUS | Status: AC | PRN
  Administered 2024-04-24: 75 mL via INTRAVENOUS

## 2024-04-24 MED ORDER — DIVALPROEX SODIUM ER 500 MG PO TB24
1000.0000 mg | ORAL_TABLET | Freq: Every day | ORAL | Status: DC
  Administered 2024-04-24 – 2024-04-29 (×6): 1000 mg via ORAL
  Filled 2024-04-24 (×6): qty 2

## 2024-04-24 MED ORDER — IPRATROPIUM-ALBUTEROL 0.5-2.5 (3) MG/3ML IN SOLN
6.0000 mL | Freq: Once | RESPIRATORY_TRACT | Status: AC
  Administered 2024-04-24: 6 mL via RESPIRATORY_TRACT
  Filled 2024-04-24: qty 3

## 2024-04-24 MED ORDER — ONDANSETRON HCL 4 MG/2ML IJ SOLN
4.0000 mg | Freq: Once | INTRAMUSCULAR | Status: DC
  Filled 2024-04-24: qty 2

## 2024-04-24 MED ORDER — SODIUM CHLORIDE 0.9 % IV SOLN
100.0000 mg | Freq: Two times a day (BID) | INTRAVENOUS | Status: DC
  Administered 2024-04-24 – 2024-04-25 (×3): 100 mg via INTRAVENOUS
  Filled 2024-04-24 (×3): qty 100

## 2024-04-24 MED ORDER — ACETAMINOPHEN 500 MG PO TABS
1000.0000 mg | ORAL_TABLET | Freq: Once | ORAL | Status: AC
  Administered 2024-04-24: 1000 mg via ORAL
  Filled 2024-04-24: qty 2

## 2024-04-24 MED ORDER — ASPIRIN 81 MG PO CHEW
324.0000 mg | CHEWABLE_TABLET | Freq: Once | ORAL | Status: AC
  Administered 2024-04-24: 324 mg via ORAL
  Filled 2024-04-24: qty 4

## 2024-04-24 MED ORDER — AMIODARONE HCL 200 MG PO TABS
100.0000 mg | ORAL_TABLET | Freq: Every day | ORAL | Status: DC
  Administered 2024-04-24 – 2024-04-29 (×6): 100 mg via ORAL
  Filled 2024-04-24 (×6): qty 1

## 2024-04-24 MED ORDER — LACTATED RINGERS IV BOLUS
1000.0000 mL | Freq: Once | INTRAVENOUS | Status: AC
  Administered 2024-04-24: 1000 mL via INTRAVENOUS

## 2024-04-24 MED ORDER — APIXABAN 5 MG PO TABS
5.0000 mg | ORAL_TABLET | Freq: Two times a day (BID) | ORAL | Status: DC
  Administered 2024-04-24 – 2024-04-25 (×3): 5 mg via ORAL
  Filled 2024-04-24 (×3): qty 1

## 2024-04-24 MED ORDER — ACETAMINOPHEN 325 MG PO TABS
650.0000 mg | ORAL_TABLET | Freq: Four times a day (QID) | ORAL | Status: DC | PRN
Start: 2024-04-24 — End: 2024-04-29

## 2024-04-24 MED ORDER — IPRATROPIUM-ALBUTEROL 0.5-2.5 (3) MG/3ML IN SOLN
3.0000 mL | RESPIRATORY_TRACT | Status: DC
  Administered 2024-04-24 – 2024-04-25 (×4): 3 mL via RESPIRATORY_TRACT
  Filled 2024-04-24 (×6): qty 3

## 2024-04-24 MED ORDER — ATORVASTATIN CALCIUM 10 MG PO TABS
20.0000 mg | ORAL_TABLET | Freq: Every day | ORAL | Status: DC
  Administered 2024-04-24 – 2024-04-28 (×5): 20 mg via ORAL
  Filled 2024-04-24 (×5): qty 2

## 2024-04-24 MED ORDER — METHYLPREDNISOLONE SODIUM SUCC 125 MG IJ SOLR
125.0000 mg | Freq: Once | INTRAMUSCULAR | Status: AC
  Administered 2024-04-24: 125 mg via INTRAVENOUS
  Filled 2024-04-24: qty 2

## 2024-04-24 MED ORDER — IPRATROPIUM-ALBUTEROL 0.5-2.5 (3) MG/3ML IN SOLN: 3.0000 mL | RESPIRATORY_TRACT | Status: DC | PRN

## 2024-04-24 MED ORDER — TAMSULOSIN HCL 0.4 MG PO CAPS
0.8000 mg | ORAL_CAPSULE | Freq: Every day | ORAL | Status: DC
  Administered 2024-04-24 – 2024-04-28 (×5): 0.8 mg via ORAL
  Filled 2024-04-24 (×5): qty 2

## 2024-04-24 MED ORDER — ACETAMINOPHEN 650 MG RE SUPP
650.0000 mg | Freq: Four times a day (QID) | RECTAL | Status: DC | PRN
Start: 2024-04-24 — End: 2024-04-29

## 2024-04-24 MED ORDER — BUDESONIDE 0.25 MG/2ML IN SUSP
0.2500 mg | Freq: Two times a day (BID) | RESPIRATORY_TRACT | Status: DC
  Administered 2024-04-24 – 2024-04-29 (×10): 0.25 mg via RESPIRATORY_TRACT
  Filled 2024-04-24 (×11): qty 2

## 2024-04-24 MED ORDER — FINASTERIDE 5 MG PO TABS
5.0000 mg | ORAL_TABLET | Freq: Every day | ORAL | Status: DC
  Administered 2024-04-24 – 2024-04-28 (×5): 5 mg via ORAL
  Filled 2024-04-24 (×5): qty 1

## 2024-04-24 MED ORDER — MORPHINE SULFATE (PF) 4 MG/ML IV SOLN
4.0000 mg | Freq: Once | INTRAVENOUS | Status: DC
  Filled 2024-04-24: qty 1

## 2024-04-24 MED ORDER — SODIUM CHLORIDE 0.9 % IV SOLN
2.0000 g | INTRAVENOUS | Status: AC
  Administered 2024-04-24 – 2024-04-28 (×5): 2 g via INTRAVENOUS
  Filled 2024-04-24 (×5): qty 20

## 2024-04-24 MED ORDER — LORAZEPAM 2 MG/ML IJ SOLN
0.5000 mg | Freq: Once | INTRAMUSCULAR | Status: AC
  Administered 2024-04-24: 0.5 mg via INTRAVENOUS
  Filled 2024-04-24: qty 1

## 2024-04-24 NOTE — Progress Notes (Signed)
 Echocardiogram 2D Echocardiogram has been performed.  Koleen KANDICE Popper, RDCS 04/24/2024, 9:54 AM

## 2024-04-24 NOTE — ED Notes (Signed)
 CCMD contacted to admit the patient for cardiac monitoring.

## 2024-04-24 NOTE — H&P (Signed)
 History and Physical    Mike Gray FMW:[id number] DOB: [birth year] DOA: 04/23/2024  Patient coming from: Home.  Chief Complaint: Shortness of breath.  HPI: Mike Gray is a 83 y.o. male with history of chronic systolic heart failure, paroxysmal atrial fibrillation, COPD, complete heart block status post pacemaker placement, ICD placement, BPH, bipolar disorder was brought to the ER after patient's brother found that patient is getting more short of breath weaker and tremulous.  Patient has been getting progressively worsening shortness of breath with nonproductive cough over the last 2 weeks.  Has not had any chest pain.  Has been eating poorly.  In the last 2 days patient became more tremulous and weak and last night became more short of breath was brought to the ER.  ED Course: In the ER patient was having low normal blood pressure fever 102 F elevated lactic acid levels and troponins were elevated 128.  Initial concern for wide-complex tachycardia cardiology evaluated and felt that patient's rhythm was normal sinus and to treat for sepsis.  Patient was started on sepsis protocol fluids and empiric antibiotics.  Admitted for further workup.  COVID and flu test is negative.  Lactic acid is 2.2 and 3.1.  BNP 798.  Review of Systems: As per HPI, rest all negative.   Past Medical History:  Diagnosis Date   Angiodysplasia    Atrial fibrillation (HCC)    CHF (congestive heart failure) (HCC)    COPD (chronic obstructive pulmonary disease) (HCC)    GI bleed    Hypertension    Presence of permanent cardiac pacemaker     Past Surgical History:  Procedure Laterality Date   COLONOSCOPY     ICD GENERATOR CHANGEOUT N/A 07/20/2022   Procedure: ICD GENERATOR CHANGEOUT;  Surgeon: Inocencio Soyla Lunger, MD;  Location: [facility] INVASIVE CV LAB;  Service: Cardiovascular;  Laterality: N/A;   INGUINAL HERNIA REPAIR Right 12/14/2022   Procedure: OPEN RIGHT HERNIA REPAIR INGUINAL ADULT WITH MESH;  Surgeon:  Rubin Calamity, MD;  Location: [facility] OR;  Service: General;  Laterality: Right;   PACEMAKER IMPLANT       reports that he quit smoking about 2 years ago. His smoking use included cigarettes. He started smoking about 42 years ago. He has a 40 pack-year smoking history. He has never used smokeless tobacco. He reports current alcohol use of about 1.0 standard drink of alcohol per week. He reports that he does not use drugs.  No Known Allergies  Family History  Problem Relation Age of Onset   Healthy Mother    Breast cancer Father 28   Cancer Other     Prior to Admission medications   Medication Sig Start Date End Date Taking? Authorizing Provider  albuterol  (VENTOLIN  HFA) 108 (90 Base) MCG/ACT inhaler INHALE 2 PUFFS BY MOUTH EVERY 6 HOURS AS NEEDED 12/21/23   Desai, Nikita S, MD  amiodarone  (PACERONE ) 200 MG tablet TAKE 1/2 (ONE-HALF) TABLET BY MOUTH ONCE DAILY . APPOINTMENT REQUIRED FOR FUTURE REFILLS 03/20/24   Camnitz, Soyla Lunger, MD  apixaban  (ELIQUIS ) 5 MG TABS tablet Take 1 tablet (5 mg total) by mouth 2 (two) times daily. 07/11/21   Camnitz, Soyla Lunger, MD  atorvastatin  (LIPITOR) 20 MG tablet Take 20 mg by mouth at bedtime.    [provider]  divalproex  (DEPAKOTE  ER) 500 MG 24 hr tablet Take 1,000 mg by mouth daily.  10/20/19   [provider]  finasteride  (PROSCAR ) 5 MG tablet Take 5 mg by mouth at bedtime. 07/15/20  [provider]  Fluticasone-Umeclidin-Vilant (TRELEGY ELLIPTA ) 100-62.5-25 MCG/ACT AEPB Inhale 1 puff into the lungs daily. 02/13/23   Meade Verdon RAMAN, MD  furosemide  (LASIX ) 20 MG tablet Take 1 tablet (20 mg total) by mouth daily as needed for edema. 10/03/23   Camnitz, Soyla Lunger, MD  metoprolol  succinate (TOPROL -XL) 100 MG 24 hr tablet Take 1 tablet (100 mg total) by mouth daily. Take with or immediately following a meal. Take along with 50 mg (Total 150 mg) 05/08/23   Camnitz, Soyla Lunger, MD  metoprolol  succinate (TOPROL -XL) 50 MG 24 hr  tablet Take 1 tablet (50 mg total) by mouth daily. Take with or immediately following a meal. Take along with 100 mg (Total 150 mg) 05/08/23   Camnitz, Soyla Lunger, MD  tamsulosin  (FLOMAX ) 0.4 MG CAPS capsule Take 1 capsule (0.4 mg total) by mouth daily after supper. Patient taking differently: Take 0.8 mg by mouth daily after supper. 07/09/20   Bryn Bernardino NOVAK, MD  TRELEGY ELLIPTA  100-62.5-25 MCG/ACT AEPB INHALE 1 PUFF ONCE DAILY . APPOINTMENT REQUIRED FOR FUTURE REFILLS 04/07/24   Meade Verdon RAMAN, MD    Physical Exam: Constitutional: Moderately built and nourished. Vitals:   04/24/24 0230 04/24/24 0245 04/24/24 0300 04/24/24 0315  BP: (!) 96/57 (!) 97/56 (!) 102/57 (!) 99/57  Pulse: (!) 107 (!) 104 (!) 103 96  Resp:    20  Temp:      TempSrc:      SpO2: 100% 100% 100% 100%  Weight:      Height:       Eyes: Anicteric no pallor. ENMT: No discharge from the ears eyes nose and mouth. Neck: No mass felt.  No neck rigidity. Respiratory: Mild expiratory wheeze and no crepitations. Cardiovascular: S1-S2 heard. Abdomen: Soft nontender bowel sound present. Musculoskeletal: No edema. Skin: No rash. Neurologic: Patient is mildly sleepy but easily arousable and follows questions and moves all extremities. Psychiatric: Sleepy but easily arousable.   Labs on Admission: I have personally reviewed following labs and imaging studies  CBC: Recent Labs  Lab 04/23/24 2355  WBC 5.9  NEUTROABS 4.2  HGB 15.4  HCT 48.0  MCV 91.1  PLT 123*   Basic Metabolic Panel: Recent Labs  Lab 04/23/24 2355  NA 137  K 4.2  CL 101  CO2 24  GLUCOSE 125*  BUN 32*  CREATININE 1.68*  CALCIUM  9.9   GFR: Estimated Creatinine Clearance: 32.8 mL/min (A) (by C-G formula based on SCr of 1.68 mg/dL (H)). Liver Function Tests: Recent Labs  Lab 04/23/24 2355  AST 39  ALT 22  ALKPHOS 52  BILITOT 1.1  PROT 7.8  ALBUMIN 3.8   No results for input(s): LIPASE, AMYLASE in the last 168 hours. No results  for input(s): AMMONIA in the last 168 hours. Coagulation Profile: No results for input(s): INR, PROTIME in the last 168 hours. Cardiac Enzymes: No results for input(s): CKTOTAL, CKMB, CKMBINDEX, TROPONINI in the last 168 hours. BNP (last 3 results) No results for input(s): PROBNP in the last 8760 hours. HbA1C: No results for input(s): HGBA1C in the last 72 hours. CBG: Recent Labs  Lab 04/23/24 2347  GLUCAP 114*   Lipid Profile: No results for input(s): CHOL, HDL, LDLCALC, TRIG, CHOLHDL, LDLDIRECT in the last 72 hours. Thyroid  Function Tests: No results for input(s): TSH, T4TOTAL, FREET4, T3FREE, THYROIDAB in the last 72 hours. Anemia Panel: No results for input(s): VITAMINB12, FOLATE, FERRITIN, TIBC, IRON, RETICCTPCT in the last 72 hours. Urine analysis:    Component  Value Date/Time   COLORURINE YELLOW 07/29/2021 1432   APPEARANCEUR CLEAR 07/29/2021 1432   LABSPEC 1.015 07/29/2021 1432   PHURINE 6.0 07/29/2021 1432   GLUCOSEU NEGATIVE 07/29/2021 1432   HGBUR NEGATIVE 07/29/2021 1432   BILIRUBINUR NEGATIVE 07/29/2021 1432   KETONESUR 5 (A) 07/29/2021 1432   PROTEINUR NEGATIVE 07/29/2021 1432   NITRITE NEGATIVE 07/29/2021 1432   LEUKOCYTESUR NEGATIVE 07/29/2021 1432   Sepsis Labs: @LABRCNTIP (procalcitonin:4,lacticidven:4) ) Recent Results (from the past 240 hours)  Resp panel by RT-PCR (RSV, Flu A&B, Covid) Anterior Nasal Swab     Status: None   Collection Time: 04/24/24 12:33 AM   Specimen: Anterior Nasal Swab  Result Value Ref Range Status   SARS Coronavirus 2 by RT PCR NEGATIVE NEGATIVE Final   Influenza A by PCR NEGATIVE NEGATIVE Final   Influenza B by PCR NEGATIVE NEGATIVE Final    Comment: (NOTE) The Xpert Xpress SARS-CoV-2/FLU/RSV plus assay is intended as an aid in the diagnosis of influenza from Nasopharyngeal swab specimens and should not be used as a sole basis for treatment. Nasal washings and aspirates  are unacceptable for Xpert Xpress SARS-CoV-2/FLU/RSV testing.  Fact Sheet for Patients: BloggerCourse.com  Fact Sheet for Healthcare Providers: SeriousBroker.it  This test is not yet approved or cleared by the [place]  FDA and has been authorized for detection and/or diagnosis of SARS-CoV-2 by FDA under an Emergency Use Authorization (EUA). This EUA will remain in effect (meaning this test can be used) for the duration of the COVID-19 declaration under Section 564(b)(1) of the Act, 21 U.S.C. section 360bbb-3(b)(1), unless the authorization is terminated or revoked.     Resp Syncytial Virus by PCR NEGATIVE NEGATIVE Final    Comment: (NOTE) Fact Sheet for Patients: BloggerCourse.com  Fact Sheet for Healthcare Providers: SeriousBroker.it  This test is not yet approved or cleared by the [place]  FDA and has been authorized for detection and/or diagnosis of SARS-CoV-2 by FDA under an Emergency Use Authorization (EUA). This EUA will remain in effect (meaning this test can be used) for the duration of the COVID-19 declaration under Section 564(b)(1) of the Act, 21 U.S.C. section 360bbb-3(b)(1), unless the authorization is terminated or revoked.  Performed at [facility], [address] [place], [place] [address]      Radiological Exams on Admission: CT CHEST ABDOMEN PELVIS W CONTRAST Result Date: 04/24/2024 CLINICAL DATA:  Sepsis EXAM: CT CHEST, ABDOMEN, AND PELVIS WITH CONTRAST TECHNIQUE: Multidetector CT imaging of the chest, abdomen and pelvis was performed following the standard protocol during bolus administration of intravenous contrast. RADIATION DOSE REDUCTION: This exam was performed according to the departmental dose-optimization program which includes automated exposure control, adjustment of the mA and/or kV according to patient size and/or use of iterative  reconstruction technique. CONTRAST:  75mL OMNIPAQUE  IOHEXOL  350 MG/ML SOLN COMPARISON:  CT chest 07/23/2021 radiograph 04/23/2024 CT abdomen pelvis 07/05/2020 FINDINGS: CT CHEST FINDINGS Cardiovascular: Cardiomegaly. No pericardial effusion. Left chest wall ICD. Aortic and coronary artery atherosclerotic calcification. No aortic dissection. Mediastinum/Nodes: Trachea and esophagus are unremarkable. No thoracic adenopathy. Lungs/Pleura: Emphysema. Respiratory motion obscures detail. Infiltrates in the left lower lobe due to pneumonia and/or aspiration. No pleural effusion or pneumothorax. Musculoskeletal: No acute fracture. CT ABDOMEN PELVIS FINDINGS Hepatobiliary: Cholelithiasis. No evidence of acute cholecystitis. No biliary dilation. Unremarkable liver. Pancreas: Unremarkable. Spleen: Unremarkable. Adrenals/Urinary Tract: Normal adrenal glands. Punctate nonobstructing stones in both kidneys. No hydronephrosis. Unremarkable bladder. Stomach/Bowel: Normal caliber large and small bowel. Colonic diverticulosis without diverticulitis. Normal appendix. Stomach  is within normal limits. Vascular/Lymphatic: Aortic atherosclerosis. No enlarged abdominal or pelvic lymph nodes. Reproductive: Enlarged prostate. Other: No free intraperitoneal air.  Trace free fluid in the pelvis. Musculoskeletal: No acute fracture. IMPRESSION: 1. Left lower lobe pneumonia and/or aspiration. 2. No acute abnormality in the abdomen or pelvis. 3. Cholelithiasis. 4. Punctate nonobstructing stones in both kidneys. 5. Prostatomegaly. Aortic Atherosclerosis (ICD10-I70.0) and Emphysema (ICD10-J43.9). Electronically Signed   By: Norman Gatlin M.D.   On: 04/24/2024 02:30   DG Chest 1 View Result Date: 04/24/2024 CLINICAL DATA:  Shortness of breath EXAM: CHEST  1 VIEW COMPARISON:  Radiograph 08/02/2021 FINDINGS: Stable cardiomegaly. Left chest wall ICD. Aortic atherosclerotic calcification. Prominent central pulmonary arteries. The left costophrenic  angle is not included in the image. No focal consolidation, pleural effusion, or pneumothorax. IMPRESSION: No acute disease.  Cardiomegaly. Electronically Signed   By: Norman Gatlin M.D.   On: 04/24/2024 00:25     Assessment/Plan Principal Problem:   Sepsis (HCC) Active Problems:   COPD with acute exacerbation (HCC)   Unspecified atrial fibrillation (HCC)   Chronic systolic CHF (congestive heart failure) (HCC)   Hypertrophic cardiomyopathy (HCC)   History of complete heart block   ICD (implantable cardioverter-defibrillator) in place   Benign prostatic hyperplasia with lower urinary tract symptoms   Lobar pneumonia (HCC)   CKD (chronic kidney disease) stage 3, GFR 30-59 ml/min (HCC)    Sepsis secondary to pneumonia -     patient has been empirically started on antibiotics.  Follow cultures.  Will hold off further fluids given history of chronic HFrEF.  Closely monitor respiratory status blood pressure trends. Metabolic encephalopathy likely from sepsis.  Improving after coming to the ER.  CT head is pending. Elevated troponin likely from sepsis.  Will trend cardiac markers.  Denies chest pain.  Appreciate cardiology consult.  Check 2D echo.  Continue statins. COPD with mild wheezing will continue on DuoNebs and Pulmicort .  If wheezing worsens may consider IV steroids. History of chronic HFrEF last EF measured was 30 to 35% on August 2023.  Status post ICD placement.  On amiodarone .  Holding metoprolol  and Lasix  due to low normal blood pressure and sepsis. Paroxysmal atrial fibrillation and history of complete heart block status post pacemaker placement on Eliquis .  Holding metoprolol  due to low normal blood pressure. History of bipolar disorder on Depakote .  Check Depakote  levels.  Ammonia levels. BPH on Flomax  and finasteride . Chronic kidney disease stage III with mild worsening of creatinine.  Holding Lasix .  Follow metabolic panel.  Patient did receive fluids.  Since patient has  sepsis will need close monitoring further workup and more than 2 midnight stay.   DVT prophylaxis: Eliquis . Code Status: DNR confirmed with patient's brother. Family Communication: [name]. Disposition Plan: Progressive care. Consults called: Cardiology. Admission status: Inpatient.

## 2024-04-24 NOTE — ED Notes (Signed)
 Patient transported to CT

## 2024-04-24 NOTE — Progress Notes (Addendum)
 Progress Note    Mike Gray   FMW:[id number]  DOB: 1940/11/27  DOA: 04/23/2024     0 PCP: Regino Slater, MD  Initial CC: SOB  Hospital Course: Mike Gray is a 83 y.o. male with history of chronic systolic heart failure, paroxysmal atrial fibrillation, COPD, complete heart block status post pacemaker placement, ICD placement, BPH, bipolar disorder was brought to the ER after patient's brother found that patient is getting more short of breath, weaker, and tremulous.   Patient has been getting progressively worsening shortness of breath with nonproductive cough over the last 2 weeks.  Has not had any chest pain.  Has been eating poorly.  In the last 2 days patient became more tremulous and weak and last night became more short of breath was brought to the ER.   In the ER patient was having low normal blood pressure, fever 102 F, elevated lactic acid levels and troponins were elevated 128.  Initial concern for wide-complex tachycardia cardiology evaluated and felt that patient's rhythm was normal sinus and to treat for sepsis.  Patient was started on sepsis protocol fluids and empiric antibiotics.  Admitted for further workup.  COVID and flu test is negative.  Lactic acid is 2.2 and 3.1.  BNP 798.   Assessment/Plan   Severe sepsis secondary to pneumonia - Fever, tachycardia, tachypnea, lactic acidosis.  Suspected lung source with pneumonia -Continue Rocephin  and doxycycline - Start steroids in setting of oxygen demand and also underlying COPD exacerbation  Acute hypoxic respiratory failure - Does not appear to be on oxygen at baseline - Wean as able - Steroids started  Acute metabolic encephalopathy  - likely from sepsis.  Improving after coming to the ER.   - CT head unremarkable  Demand ischemia - Also suspected from sepsis - Evaluated by cardiology - Follow-up echo  COPD exacerbation - Continue nebs - Continue steroids  History of chronic HFrEF  - last EF measured  was 30 to 35% on August 2023.  Status post ICD placement.  On amiodarone .  Holding metoprolol  and Lasix  due to low normal blood pressure and sepsis.  Paroxysmal atrial fibrillation  - history of complete heart block status post pacemaker placement on Eliquis .   - Holding metoprolol  due to low normal blood pressure.  History of bipolar disorder  - on Depakote  - Valproic level 59 - Follow-up ammonia level  BPH  - on Flomax  and finasteride .  Chronic kidney disease stage III  - Baseline creatinine around 1.5-1.6.  Very mildly above baseline -S/p some fluids on admission - Repeat BMP in a.m  Interval History:  Very somnolent and sleeping in the ER.  Opens eyes to voice but falls back asleep.  Follows some commands.  Old records reviewed in assessment of this patient  Antimicrobials: Doxycycline 04/24/2024 >> current Rocephin  04/24/2024 >> current  DVT prophylaxis:   apixaban  (ELIQUIS ) tablet 5 mg   Code Status:   Code Status: Limited: Do not attempt resuscitation (DNR) -DNR-LIMITED -Do Not Intubate/DNI   Mobility Assessment (Last 72 Hours)     Mobility Assessment   No documentation.     Barriers to discharge: none Disposition Plan:  Home HH orders placed:  Status is: Inpt  Objective: Blood pressure 111/62, pulse 70, temperature 98 F (36.7 C), temperature source Axillary, resp. rate 19, height 5' 8 (1.727 m), weight 78 kg, SpO2 100%.  Examination:  Physical Exam Constitutional:      General: He is not in acute distress.    Appearance: Normal  appearance.     Comments: Chronically ill appearing elderly man laying in bed in NAD  HENT:     Head: Normocephalic and atraumatic.     Mouth/Throat:     Mouth: Mucous membranes are moist.  Eyes:     Extraocular Movements: Extraocular movements intact.  Cardiovascular:     Rate and Rhythm: Normal rate and regular rhythm.  Pulmonary:     Effort: Pulmonary effort is normal. No respiratory distress.     Breath sounds: Rhonchi  (L>R) present. No wheezing.  Abdominal:     General: Bowel sounds are normal. There is no distension.     Palpations: Abdomen is soft.     Tenderness: There is no abdominal tenderness.  Musculoskeletal:        General: Normal range of motion.     Cervical back: Normal range of motion and neck supple.  Skin:    General: Skin is warm and dry.  Neurological:     Comments: Moves all 4 extremities and following some commands  Psychiatric:        Mood and Affect: Mood normal.        Behavior: Behavior normal.      Consultants:    Procedures:    Data Reviewed: Results for orders placed or performed during the hospital encounter of 04/23/24 (from the past 24 hours)  Blood culture (routine x 2)     Status: None (Preliminary result)   Collection Time: 04/23/24 11:41 PM   Specimen: BLOOD  Result Value Ref Range   Specimen Description BLOOD SITE NOT SPECIFIED    Special Requests      BOTTLES DRAWN AEROBIC AND ANAEROBIC Blood Culture results may not be optimal due to an inadequate volume of blood received in culture bottles   Culture      NO GROWTH < 12 HOURS Performed at [facility], [address] [place], [place] [address]    Report Status PENDING   Blood culture (routine x 2)     Status: None (Preliminary result)   Collection Time: 04/23/24 11:46 PM   Specimen: BLOOD  Result Value Ref Range   Specimen Description BLOOD SITE NOT SPECIFIED    Special Requests      BOTTLES DRAWN AEROBIC AND ANAEROBIC Blood Culture results may not be optimal due to an inadequate volume of blood received in culture bottles   Culture      NO GROWTH < 12 HOURS Performed at [facility], [address], [place], [place] [address]    Report Status PENDING   CBG monitoring, ED     Status: Abnormal   Collection Time: 04/23/24 11:47 PM  Result Value Ref Range   Glucose-Capillary 114 (H) 70 - 99 mg/dL  BNP (Order if Patient has history of Heart Failure)     Status: Abnormal   Collection  Time: 04/23/24 11:55 PM  Result Value Ref Range   B Natriuretic Peptide 798.5 (H) 0.0 - 100.0 pg/mL  CBC with Differential     Status: Abnormal   Collection Time: 04/23/24 11:55 PM  Result Value Ref Range   WBC 5.9 4.0 - 10.5 K/uL   RBC 5.27 4.22 - 5.81 MIL/uL   Hemoglobin 15.4 13.0 - 17.0 g/dL   HCT 51.9 60.9 - 47.9 %   MCV 91.1 80.0 - 100.0 fL   MCH 29.2 26.0 - 34.0 pg   MCHC 32.1 30.0 - 36.0 g/dL   RDW 84.8 88.4 - 84.4 %  Platelets 123 (L) 150 - 400 K/uL   nRBC 0.0 0.0 - 0.2 %   Neutrophils Relative % 71 %   Neutro Abs 4.2 1.7 - 7.7 K/uL   Lymphocytes Relative 20 %   Lymphs Abs 1.2 0.7 - 4.0 K/uL   Monocytes Relative 9 %   Monocytes Absolute 0.5 0.1 - 1.0 K/uL   Eosinophils Relative 0 %   Eosinophils Absolute 0.0 0.0 - 0.5 K/uL   Basophils Relative 0 %   Basophils Absolute 0.0 0.0 - 0.1 K/uL   Immature Granulocytes 0 %   Abs Immature Granulocytes 0.02 0.00 - 0.07 K/uL  Comprehensive metabolic panel     Status: Abnormal   Collection Time: 04/23/24 11:55 PM  Result Value Ref Range   Sodium 137 135 - 145 mmol/L   Potassium 4.2 3.5 - 5.1 mmol/L   Chloride 101 98 - 111 mmol/L   CO2 24 22 - 32 mmol/L   Glucose, Bld 125 (H) 70 - 99 mg/dL   BUN 32 (H) 8 - 23 mg/dL   Creatinine, Ser 8.31 (H) 0.61 - 1.24 mg/dL   Calcium  9.9 8.9 - 10.3 mg/dL   Total Protein 7.8 6.5 - 8.1 g/dL   Albumin 3.8 3.5 - 5.0 g/dL   AST 39 15 - 41 U/L   ALT 22 0 - 44 U/L   Alkaline Phosphatase 52 38 - 126 U/L   Total Bilirubin 1.1 0.0 - 1.2 mg/dL   GFR, Estimated 40 (L) >60 mL/min   Anion gap 12 5 - 15  Troponin I (High Sensitivity)     Status: Abnormal   Collection Time: 04/23/24 11:55 PM  Result Value Ref Range   Troponin I (High Sensitivity) 128 (HH) <18 ng/L  I-Stat CG4 Lactic Acid     Status: Abnormal   Collection Time: 04/24/24 12:05 AM  Result Value Ref Range   Lactic Acid, Venous 2.2 (HH) 0.5 - 1.9 mmol/L   Comment NOTIFIED PHYSICIAN   Resp panel by RT-PCR (RSV, Flu A&B, Covid) Anterior  Nasal Swab     Status: None   Collection Time: 04/24/24 12:33 AM   Specimen: Anterior Nasal Swab  Result Value Ref Range   SARS Coronavirus 2 by RT PCR NEGATIVE NEGATIVE   Influenza A by PCR NEGATIVE NEGATIVE   Influenza B by PCR NEGATIVE NEGATIVE   Resp Syncytial Virus by PCR NEGATIVE NEGATIVE  Troponin I (High Sensitivity)     Status: Abnormal   Collection Time: 04/24/24  1:25 AM  Result Value Ref Range   Troponin I (High Sensitivity) 150 (HH) <18 ng/L  I-Stat CG4 Lactic Acid     Status: Abnormal   Collection Time: 04/24/24  1:30 AM  Result Value Ref Range   Lactic Acid, Venous 3.1 (HH) 0.5 - 1.9 mmol/L  Basic metabolic panel     Status: Abnormal   Collection Time: 04/24/24  4:48 AM  Result Value Ref Range   Sodium 134 (L) 135 - 145 mmol/L   Potassium 4.2 3.5 - 5.1 mmol/L   Chloride 104 98 - 111 mmol/L   CO2 21 (L) 22 - 32 mmol/L   Glucose, Bld 171 (H) 70 - 99 mg/dL   BUN 31 (H) 8 - 23 mg/dL   Creatinine, Ser 8.22 (H) 0.61 - 1.24 mg/dL   Calcium  9.0 8.9 - 10.3 mg/dL   GFR, Estimated 38 (L) >60 mL/min   Anion gap 9 5 - 15  Hepatic function panel     Status: Abnormal  Collection Time: 04/24/24  4:48 AM  Result Value Ref Range   Total Protein 6.1 (L) 6.5 - 8.1 g/dL   Albumin 2.8 (L) 3.5 - 5.0 g/dL   AST 31 15 - 41 U/L   ALT 16 0 - 44 U/L   Alkaline Phosphatase 38 38 - 126 U/L   Total Bilirubin 0.8 0.0 - 1.2 mg/dL   Bilirubin, Direct 0.2 0.0 - 0.2 mg/dL   Indirect Bilirubin 0.6 0.3 - 0.9 mg/dL  Magnesium      Status: Abnormal   Collection Time: 04/24/24  4:48 AM  Result Value Ref Range   Magnesium  1.6 (L) 1.7 - 2.4 mg/dL  CBC with Differential/Platelet     Status: Abnormal   Collection Time: 04/24/24  4:48 AM  Result Value Ref Range   WBC 9.6 4.0 - 10.5 K/uL   RBC 4.34 4.22 - 5.81 MIL/uL   Hemoglobin 12.8 (L) 13.0 - 17.0 g/dL   HCT 60.1 60.9 - 47.9 %   MCV 91.7 80.0 - 100.0 fL   MCH 29.5 26.0 - 34.0 pg   MCHC 32.2 30.0 - 36.0 g/dL   RDW 84.9 88.4 - 84.4 %    Platelets 129 (L) 150 - 400 K/uL   nRBC 0.0 0.0 - 0.2 %   Neutrophils Relative % 92 %   Neutro Abs 8.8 (H) 1.7 - 7.7 K/uL   Lymphocytes Relative 4 %   Lymphs Abs 0.4 (L) 0.7 - 4.0 K/uL   Monocytes Relative 4 %   Monocytes Absolute 0.4 0.1 - 1.0 K/uL   Eosinophils Relative 0 %   Eosinophils Absolute 0.0 0.0 - 0.5 K/uL   Basophils Relative 0 %   Basophils Absolute 0.0 0.0 - 0.1 K/uL   Immature Granulocytes 0 %   Abs Immature Granulocytes 0.03 0.00 - 0.07 K/uL  TSH     Status: None   Collection Time: 04/24/24  4:48 AM  Result Value Ref Range   TSH 1.586 0.350 - 4.500 uIU/mL  Valproic acid level     Status: None   Collection Time: 04/24/24  4:48 AM  Result Value Ref Range   Valproic Acid Lvl 59 50 - 100 ug/mL  Troponin I (High Sensitivity)     Status: Abnormal   Collection Time: 04/24/24  4:48 AM  Result Value Ref Range   Troponin I (High Sensitivity) 202 (HH) <18 ng/L  Troponin I (High Sensitivity)     Status: Abnormal   Collection Time: 04/24/24  6:23 AM  Result Value Ref Range   Troponin I (High Sensitivity) 176 (HH) <18 ng/L    I have reviewed pertinent nursing notes, vitals, labs, and images as necessary. I have ordered labwork to follow up on as indicated.  I have reviewed the last notes from staff over past 24 hours. I have discussed patient's care plan and test results with nursing staff, CM/SW, and other staff as appropriate.  Time spent: Greater than 50% of the 55 minute visit was spent in counseling/coordination of care for the patient as laid out in the A&P.   LOS: 0 days   Alm Apo, MD [facility] 04/24/2024, 1:05 PM

## 2024-04-24 NOTE — Plan of Care (Signed)

## 2024-04-24 NOTE — Evaluation (Signed)
  Device interrogation note  83 year old male with congestive heart failure status post [employer] dual-chamber ICD implanted in 06/2022, pleat heart block, paroxysmal atrial fibrillation is here for septic shock.  In the ED, ECG showed wide-complex rhythm with right bundle branch block morphology and ventricular rates of approximately 120 to 130 bpm.   Device interrogation shows sinus tachycardia with (a sensed V paced rhythm)ventricular rate of 122 bpm with AV delay of 150 ms. Current settings include DDDR with lower rate limit of 60, upper sensing rate of 120 bpm and upper tracking rate of 130 bpm, SAV/PAV 150/180.   Patient is currently in sinus rhythm near his maximal tracking rate.  If his sinus rate becomes greater than his maximal tracking rate, we will likely see pacemaker induced Orest Elders or 2:1 AV block on telemetry.  Primary team continuing to treat for sepsis.

## 2024-04-24 NOTE — Progress Notes (Signed)
 Was informed by tele tech that patient had 5 beats of v tach @ 1746. Dr Patsy informed. No new orders.

## 2024-04-24 NOTE — Hospital Course (Addendum)
 Mike Gray is a 83 y.o. male with history of chronic systolic heart failure, paroxysmal atrial fibrillation, COPD, complete heart block status post pacemaker placement, ICD placement, BPH, bipolar disorder was brought to the ER after patient's brother found that patient is getting more short of breath, weaker, and tremulous.   Patient has been getting progressively worsening shortness of breath with nonproductive cough over the last 2 weeks.  Has not had any chest pain.  Has been eating poorly.  In the last 2 days patient became more tremulous and weak and last night became more short of breath was brought to the ER.   In the ER patient was having low normal blood pressure, fever 102 F, elevated lactic acid levels and troponins were elevated 128.  Initial concern for wide-complex tachycardia cardiology evaluated and felt that patient's rhythm was normal sinus and to treat for sepsis.  Patient was started on sepsis protocol fluids and empiric antibiotics.  Admitted for further workup.  COVID and flu test is negative.  Lactic acid is 2.2 and 3.1.  BNP 798.   Assessment/Plan   Severe sepsis secondary to pneumonia - Fever, tachycardia, tachypnea, lactic acidosis.  Suspected lung source with pneumonia -Continue Rocephin  and doxycycline - Start steroids in setting of oxygen demand and also underlying COPD exacerbation  Acute hypoxic respiratory failure - Does not appear to be on oxygen at baseline - Wean as able - Steroids started  Acute metabolic encephalopathy  - likely from sepsis.  Improving after coming to the ER.   - CT head unremarkable  Demand ischemia - Also suspected from sepsis - Evaluated by cardiology - Follow-up echo  COPD exacerbation - Continue nebs - Continue steroids  History of chronic HFrEF  - last EF measured was 30 to 35% on August 2023.  Status post ICD placement.  On amiodarone .  Holding metoprolol  and Lasix  due to low normal blood pressure and  sepsis.  Paroxysmal atrial fibrillation  - history of complete heart block status post pacemaker placement on Eliquis .   - Holding metoprolol  due to low normal blood pressure.  History of bipolar disorder  - on Depakote  - Valproic level 59 - Follow-up ammonia level  BPH  - on Flomax  and finasteride .  Chronic kidney disease stage III  - Baseline creatinine around 1.5-1.6.  Very mildly above baseline -S/p some fluids on admission - Repeat BMP in a.m

## 2024-04-25 ENCOUNTER — Inpatient Hospital Stay (HOSPITAL_COMMUNITY)

## 2024-04-25 DIAGNOSIS — A419 Sepsis, unspecified organism: Secondary | ICD-10-CM | POA: Diagnosis not present

## 2024-04-25 LAB — BLOOD CULTURE ID PANEL (REFLEXED) - BCID2

## 2024-04-25 LAB — CBC WITH DIFFERENTIAL/PLATELET
Abs Immature Granulocytes: 0.05 K/uL (ref 0.00–0.07)
Basophils Absolute: 0 K/uL (ref 0.0–0.1)
Basophils Relative: 0 %
Eosinophils Absolute: 0 K/uL (ref 0.0–0.5)
Eosinophils Relative: 0 %
HCT: 39.2 % (ref 39.0–52.0)
Hemoglobin: 12.7 g/dL — ABNORMAL LOW (ref 13.0–17.0)
Immature Granulocytes: 0 %
Lymphocytes Relative: 6 %
Lymphs Abs: 0.7 K/uL (ref 0.7–4.0)
MCH: 29 pg (ref 26.0–34.0)
MCHC: 32.4 g/dL (ref 30.0–36.0)
MCV: 89.5 fL (ref 80.0–100.0)
Monocytes Absolute: 0.8 K/uL (ref 0.1–1.0)
Monocytes Relative: 7 %
Neutro Abs: 10 K/uL — ABNORMAL HIGH (ref 1.7–7.7)
Neutrophils Relative %: 87 %
Platelets: 138 K/uL — ABNORMAL LOW (ref 150–400)
RBC: 4.38 MIL/uL (ref 4.22–5.81)
RDW: 15.1 % (ref 11.5–15.5)
WBC: 11.6 K/uL — ABNORMAL HIGH (ref 4.0–10.5)
nRBC: 0 % (ref 0.0–0.2)

## 2024-04-25 LAB — BASIC METABOLIC PANEL WITH GFR
Anion gap: 10 (ref 5–15)
BUN: 36 mg/dL — ABNORMAL HIGH (ref 8–23)
CO2: 22 mmol/L (ref 22–32)
Calcium: 9.5 mg/dL (ref 8.9–10.3)
Chloride: 106 mmol/L (ref 98–111)
Creatinine, Ser: 1.49 mg/dL — ABNORMAL HIGH (ref 0.61–1.24)
GFR, Estimated: 47 mL/min — ABNORMAL LOW (ref >60)
Glucose, Bld: 91 mg/dL (ref 70–99)
Potassium: 4.4 mmol/L (ref 3.5–5.1)
Sodium: 138 mmol/L (ref 135–145)

## 2024-04-25 LAB — MRSA NEXT GEN BY PCR, NASAL: MRSA by PCR Next Gen: NOT DETECTED

## 2024-04-25 LAB — MAGNESIUM: Magnesium: 1.8 mg/dL (ref 1.7–2.4)

## 2024-04-25 LAB — C-REACTIVE PROTEIN: CRP: 7 mg/dL — ABNORMAL HIGH (ref <1.0)

## 2024-04-25 LAB — PROCALCITONIN: Procalcitonin: 0.15 ng/mL

## 2024-04-25 LAB — BRAIN NATRIURETIC PEPTIDE: B Natriuretic Peptide: 910 pg/mL — ABNORMAL HIGH (ref 0.0–100.0)

## 2024-04-25 MED ORDER — APIXABAN 2.5 MG PO TABS
2.5000 mg | ORAL_TABLET | Freq: Two times a day (BID) | ORAL | Status: DC
  Administered 2024-04-25 – 2024-04-29 (×8): 2.5 mg via ORAL
  Filled 2024-04-25 (×8): qty 1

## 2024-04-25 MED ORDER — MAGNESIUM SULFATE 4 GM/100ML IV SOLN
4.0000 g | Freq: Once | INTRAVENOUS | Status: AC
  Administered 2024-04-25: 4 g via INTRAVENOUS
  Filled 2024-04-25: qty 100

## 2024-04-25 MED ORDER — DOXYCYCLINE HYCLATE 100 MG PO TABS
100.0000 mg | ORAL_TABLET | Freq: Two times a day (BID) | ORAL | Status: DC
  Administered 2024-04-25 – 2024-04-29 (×8): 100 mg via ORAL
  Filled 2024-04-25 (×8): qty 1

## 2024-04-25 MED ORDER — FUROSEMIDE 10 MG/ML IJ SOLN: 20.0000 mg | Freq: Once | INTRAMUSCULAR | Status: DC

## 2024-04-25 MED ORDER — METHYLPREDNISOLONE SODIUM SUCC 40 MG IJ SOLR
40.0000 mg | INTRAMUSCULAR | Status: DC
  Administered 2024-04-25: 40 mg via INTRAVENOUS
  Filled 2024-04-25: qty 1

## 2024-04-25 MED ORDER — METOPROLOL TARTRATE 50 MG PO TABS
50.0000 mg | ORAL_TABLET | Freq: Two times a day (BID) | ORAL | Status: DC
  Administered 2024-04-25 – 2024-04-29 (×7): 50 mg via ORAL
  Filled 2024-04-25 (×9): qty 1

## 2024-04-25 MED ORDER — FUROSEMIDE 10 MG/ML IJ SOLN
40.0000 mg | Freq: Once | INTRAMUSCULAR | Status: AC
  Administered 2024-04-25: 40 mg via INTRAVENOUS
  Filled 2024-04-25: qty 4

## 2024-04-25 MED ORDER — IPRATROPIUM-ALBUTEROL 0.5-2.5 (3) MG/3ML IN SOLN
3.0000 mL | Freq: Four times a day (QID) | RESPIRATORY_TRACT | Status: DC
  Administered 2024-04-25 – 2024-04-26 (×6): 3 mL via RESPIRATORY_TRACT
  Filled 2024-04-25 (×6): qty 3

## 2024-04-25 NOTE — Progress Notes (Signed)
 Tele sitter ordered for patient who has exited the bed multiple times this shift, will not follow direction and is very confused and unsteady.  This RN informed that the hospital does not have any available cameras and patient will be placed on a wait list for one.  This is a high fall risk patient who is confused and not following commands.  Bed alarm is on at this time but that has not deterred patient from getting out of the bed.  Fall mats are in place. This RN has a 5 patient assignment and is not always in close proximity to room when bed alarm goes off.

## 2024-04-25 NOTE — Evaluation (Signed)
 Physical Therapy Evaluation Patient Details Name: Mike Gray MRN: [id number] DOB: Jan 08, 1941 Today's Date: 04/25/2024  History of Present Illness  83 y.o. male was brought to the ER after patient's brother found that patient is getting more short of breath, weaker, and tremulous.    Patient has been getting progressively worsening shortness of breath with nonproductive cough over the last 2 weeks.  Has not had any chest pain.  Has been eating poorly.  In the last 2 days patient became more tremulous and weak and last night became more short of breath was brought to the ER.  Was diagnosed with pneumonia and admitted to the hospital. Past medical history of chronic systolic heart failure, paroxysmal atrial fibrillation, COPD, complete heart block status post pacemaker placement, ICD placement, BPH, bipolar disorder.  Clinical Impression  Pt presents with admitting diagnosis above. Pt was A&Ox3 and started off answering questions appropriately however became very impulsive as session progressed not following commands at all. Pt able to ambulate short distance in room with RW Mod A however was very impulsive constant reaching out of BOS requiring Max cues for safety and proximity to RW. Pt was fixated on using toilet however due to poor safety awareness opted for [facility]. PTA pt reported that he was fully independent however no family present to confirm this. Patient will benefit from continued inpatient follow up therapy, <3 hours/day. PT will continue to follow.          If plan is discharge home, recommend the following: A lot of help with walking and/or transfers;A lot of help with bathing/dressing/bathroom;Assistance with cooking/housework;Direct supervision/assist for medications management;Assist for transportation;Help with stairs or ramp for entrance;Supervision due to cognitive status   Can travel by private vehicle   No    Equipment Recommendations Rolling walker (2 wheels)  Recommendations  for Other Services       Functional Status Assessment Patient has had a recent decline in their functional status and demonstrates the ability to make significant improvements in function in a reasonable and predictable amount of time.     Precautions / Restrictions Precautions Precautions: Fall Recall of Precautions/Restrictions: Intact Restrictions Weight Bearing Restrictions Per Provider Order: No      Mobility  Bed Mobility Overal bed mobility: Needs Assistance Bed Mobility: Supine to Sit, Sit to Supine     Supine to sit: Min assist Sit to supine: Mod assist, +2 for physical assistance   General bed mobility comments: Min A to scoot hips forwards and for trunk elevation however required +2 Mod A to return to bed due to pt not following commands.    Transfers Overall transfer level: Needs assistance Equipment used: None, Rolling walker (2 wheels) Transfers: Sit to/from Stand Sit to Stand: Contact guard assist, +2 physical assistance, Max assist, Min assist           General transfer comment: Initially trialed STS with no AD however once standing pt had significant LOB requiring +2 Max A to correct. Pt then given RW for remainder of session.    Ambulation/Gait Ambulation/Gait assistance: Mod assist, +2 safety/equipment Gait Distance (Feet): 10 Feet Assistive device: Rolling walker (2 wheels) Gait Pattern/deviations: Trunk flexed, Drifts right/left, Narrow base of support, Decreased stride length, Step-through pattern Gait velocity: decreased     General Gait Details: Pt able to ambulate short distance in room with RW Mod A however was very impulsive constant reaching out of BOS requiring Max cues for safety and proximity to RW. Pt was fixated on using toilet  however due to poor safety awareness opted for [facility].  Stairs            Wheelchair Mobility     Tilt Bed    Modified Rankin (Stroke Patients Only)       Balance Overall balance assessment: Needs  assistance Sitting-balance support: Bilateral upper extremity supported, Feet supported Sitting balance-Leahy Scale: Fair     Standing balance support: Bilateral upper extremity supported, During functional activity Standing balance-Leahy Scale: Poor Standing balance comment: Reliant on external support                             Pertinent Vitals/Pain Pain Assessment Pain Assessment: No/denies pain    Home Living Family/patient expects to be discharged to:: Private residence Living Arrangements: Other relatives (Brother) Available Help at Discharge: Family;Available 24 hours/day Type of Home: House Home Access: Stairs to enter Entrance Stairs-Rails: None Entrance Stairs-Number of Steps: 4   Home Layout: Multi-level;Bed/bath upstairs;Able to live on main level with bedroom/bathroom Home Equipment: Cane - single point;Grab bars - tub/shower      Prior Function Prior Level of Function : Independent/Modified Independent             Mobility Comments: Pt reports that he occasionally uses a SPC however is mostly independent ADLs Comments: Ind     Extremity/Trunk Assessment   Upper Extremity Assessment Upper Extremity Assessment: Generalized weakness    Lower Extremity Assessment Lower Extremity Assessment: Generalized weakness    Cervical / Trunk Assessment Cervical / Trunk Assessment: Kyphotic  Communication   Communication Communication: Impaired Factors Affecting Communication: Hearing impaired    Cognition Arousal: Alert Behavior During Therapy: Impulsive, Restless   PT - Cognitive impairments: Safety/Judgement, Problem solving, Sequencing, Initiation, Awareness, Orientation   Orientation impairments: Time                   PT - Cognition Comments: Pt was A&Ox3 and started off answering questions appropriately however became very impulsive as session progressed not following commands at all. Following commands: Impaired Following  commands impaired: Follows one step commands inconsistently, Follows one step commands with increased time     Cueing Cueing Techniques: Verbal cues, Tactile cues     General Comments General comments (skin integrity, edema, etc.): VSS on 2L    Exercises     Assessment/Plan    PT Assessment    PT Problem List         PT Treatment Interventions      PT Goals (Current goals can be found in the Care Plan section)  Acute Rehab PT Goals Patient Stated Goal: to go home PT Goal Formulation: With patient Time For Goal Achievement: 05/09/24 Potential to Achieve Goals: Fair    Frequency Min 2X/week     Co-evaluation               AM-PAC PT 6 Clicks Mobility  Outcome Measure Help needed turning from your back to your side while in a flat bed without using bedrails?: A Little Help needed moving from lying on your back to sitting on the side of a flat bed without using bedrails?: A Little Help needed moving to and from a bed to a chair (including a wheelchair)?: A Lot Help needed standing up from a chair using your arms (e.g., wheelchair or bedside chair)?: A Lot Help needed to walk in hospital room?: Total Help needed climbing 3-5 steps with a railing? : Total 6 Click  Score: 12    End of Session Equipment Utilized During Treatment: Gait belt;Oxygen Activity Tolerance: Patient tolerated treatment well Patient left: in bed;with call bell/phone within reach;with bed alarm set Nurse Communication: Mobility status PT Visit Diagnosis: Other abnormalities of gait and mobility (R26.89)    Time: 9045-8977 PT Time Calculation (min) (ACUTE ONLY): 28 min   Charges:   PT Evaluation $PT Eval Moderate Complexity: 1 Mod PT Treatments $Gait Training: 8-22 mins PT General Charges $$ ACUTE PT VISIT: 1 Visit         Sueellen NOVAK, PT, DPT Acute Rehab Services [phone]   Trentan Trippe 04/25/2024, 12:14 PM

## 2024-04-25 NOTE — Discharge Instructions (Signed)

## 2024-04-25 NOTE — TOC Initial Note (Signed)
 Transition of Care [facility]) - Initial/Assessment Note    Patient Details  Name: Mike Gray MRN: [id number] Date of Birth: 05/30/1941  [facility] CM/SW Contact:    Bridget Cordella Simmonds, LCSW Phone Number: 04/25/2024, 3:29 PM  Clinical Narrative:   Pt oriented x1-2, able to have conversation with CSW, discussed SNF, pt states he will go to SNF if that's what the doctor wants me to do.  CSW also spoke with brother Mike Gray, who provides all information.  Pt lives with Mike Gray, no current services.  Robert in agreement with plan for SNF, no facility preference.    Referral sent out in hub for SNF.                Expected Discharge Plan: Skilled Nursing Facility Barriers to Discharge: Continued Medical Work up, SNF Pending bed offer   Patient Goals and CMS Choice            Expected Discharge Plan and Services In-house Referral: Clinical Social Work   Post Acute Care Choice: Skilled Nursing Facility Living arrangements for the past 2 months: Single Family Home                                      Prior Living Arrangements/Services Living arrangements for the past 2 months: Single Family Home Lives with:: Siblings (with brother Mike Gray) Patient language and need for interpreter reviewed:: Yes        Need for Family Participation in Patient Care: Yes (Comment) Care giver support system in place?: Yes (comment) Current home services: Other (comment) (none) Criminal Activity/Legal Involvement Pertinent to Current Situation/Hospitalization: No - Comment as needed  Activities of Daily Living   ADL Screening (condition at time of admission) Independently performs ADLs?: Yes (appropriate for developmental age) Is the patient deaf or have difficulty hearing?: Yes Does the patient have difficulty seeing, even when wearing glasses/contacts?: No Does the patient have difficulty concentrating, remembering, or making decisions?: No  Permission Sought/Granted                   Emotional Assessment Appearance:: Appears stated age Attitude/Demeanor/Rapport: Engaged Affect (typically observed): Pleasant Orientation: : Oriented to Self, Oriented to Place      Admission diagnosis:  COPD exacerbation (HCC) [J44.1] Sepsis (HCC) [A41.9] Patient Active Problem List   Diagnosis Date Noted   Sepsis (HCC) 04/24/2024   CKD (chronic kidney disease) stage 3, GFR 30-59 ml/min (HCC) 04/24/2024   Elevated troponin 04/24/2024   Long term (current) use of anticoagulants 11/20/2022   Lobar pneumonia (HCC) 08/02/2021   PAF (paroxysmal atrial fibrillation) (HCC) 07/19/2021   Increased ammonia level 07/19/2021   Paroxysmal ventricular tachycardia (HCC) 03/22/2021   Left ear impacted cerumen 03/08/2021   Benign prostatic hyperplasia with lower urinary tract symptoms 12/07/2020   Hearing loss 12/07/2020   Hypertensive heart failure (HCC) 12/07/2020   Pure hypercholesterolemia 12/07/2020   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 [place] [address] Phone: [phone] Fax: [phone]     Social Drivers of Health (SDOH) Social History: SDOH Screenings   Food Insecurity: No Food Insecurity (04/24/2024)  Housing: Low Risk  (04/24/2024)  Transportation Needs: No Transportation Needs (04/24/2024)  Utilities: Not At Risk (04/24/2024)  Depression (PHQ2-9): Low Risk   (07/18/2022)  Social Connections: Socially Isolated (04/24/2024)  Tobacco Use: Medium Risk (04/24/2024)   SDOH Interventions:     Readmission Risk Interventions     No data to display

## 2024-04-25 NOTE — Progress Notes (Signed)
 PHARMACY - PHYSICIAN COMMUNICATION CRITICAL VALUE ALERT - BLOOD CULTURE IDENTIFICATION (BCID)  Mike Gray is an 83 y.o. male who presented to [facility] on 04/23/2024 with a chief complaint of increasing weakness/SOB with PNA   Assessment:  1/4 blood cultures with staph spp.  Name of physician (or Provider) Contacted: Dr. Alfornia  Current antibiotics: Ceftriaxone  2g IV every 24 hours + doxycycline  100mg  IV every 12 hours  Changes to prescribed antibiotics recommended:   -Continue current course of antibiotics for PNA  Results for orders placed or performed during the hospital encounter of 04/23/24  Blood Culture ID Panel (Reflexed) (Collected: 04/23/2024 11:41 PM)  Result Value Ref Range   Enterococcus faecalis NOT DETECTED NOT DETECTED   Enterococcus Faecium NOT DETECTED NOT DETECTED   Listeria monocytogenes NOT DETECTED NOT DETECTED   Staphylococcus species DETECTED (A) NOT DETECTED   Staphylococcus aureus (BCID) NOT DETECTED NOT DETECTED   Staphylococcus epidermidis NOT DETECTED NOT DETECTED   Staphylococcus lugdunensis NOT DETECTED NOT DETECTED   Streptococcus species NOT DETECTED NOT DETECTED   Streptococcus agalactiae NOT DETECTED NOT DETECTED   Streptococcus pneumoniae NOT DETECTED NOT DETECTED   Streptococcus pyogenes NOT DETECTED NOT DETECTED   A.calcoaceticus-baumannii NOT DETECTED NOT DETECTED   Bacteroides fragilis NOT DETECTED NOT DETECTED   Enterobacterales NOT DETECTED NOT DETECTED   Enterobacter cloacae complex NOT DETECTED NOT DETECTED   Escherichia coli NOT DETECTED NOT DETECTED   Klebsiella aerogenes NOT DETECTED NOT DETECTED   Klebsiella oxytoca NOT DETECTED NOT DETECTED   Klebsiella pneumoniae NOT DETECTED NOT DETECTED   Proteus species NOT DETECTED NOT DETECTED   Salmonella species NOT DETECTED NOT DETECTED   Serratia marcescens NOT DETECTED NOT DETECTED   Haemophilus influenzae NOT DETECTED NOT DETECTED   Neisseria meningitidis NOT DETECTED NOT DETECTED    Pseudomonas aeruginosa NOT DETECTED NOT DETECTED   Stenotrophomonas maltophilia NOT DETECTED NOT DETECTED   Candida albicans NOT DETECTED NOT DETECTED   Candida auris NOT DETECTED NOT DETECTED   Candida glabrata NOT DETECTED NOT DETECTED   Candida krusei NOT DETECTED NOT DETECTED   Candida parapsilosis NOT DETECTED NOT DETECTED   Candida tropicalis NOT DETECTED NOT DETECTED   Cryptococcus neoformans/gattii NOT DETECTED NOT DETECTED    Lynwood Poplar, PharmD, BCPS Clinical Pharmacist 04/25/2024 6:51 AM

## 2024-04-25 NOTE — Progress Notes (Addendum)
 PROGRESS NOTE                                                                                                                                                                                                             Patient Demographics:    Mike Gray, is a 83 y.o. male, DOB - May 31, 1941, FMW:[id number]  Outpatient Primary MD for the patient is Regino, [name], MD    LOS - 1  Admit date - 04/23/2024    Chief Complaint  Patient presents with   Shortness of Breath       Brief Narrative (HPI from H&P)   83 y.o. male with history of chronic systolic heart failure, paroxysmal atrial fibrillation, COPD, complete heart block status post pacemaker placement, ICD placement, BPH, bipolar disorder was brought to the ER after patient's brother found that patient is getting more short of breath, weaker, and tremulous.   Patient has been getting progressively worsening shortness of breath with nonproductive cough over the last 2 weeks.  Has not had any chest pain.  Has been eating poorly.  In the last 2 days patient became more tremulous and weak and last night became more short of breath was brought to the ER.  Was diagnosed with pneumonia and admitted to the hospital   Subjective:    Mike Gray today has, No headache, No chest pain, No abdominal pain - No Nausea, No new weakness tingling or numbness, no SOB   Assessment  & Plan :    Severe sepsis secondary to pneumonia - Fever, tachycardia, tachypnea, lactic acidosis.  Suspected lung source with pneumonia -Continue Rocephin  and doxycycline  - Currently no wheezing, low-dose steroids started earlier will be tapered over the next few days   Acute hypoxic respiratory failure - Does not appear to be on oxygen at baseline - Combination of pneumonia and CHF, antibiotics, gentle steroids and diuresis.   Acute metabolic encephalopathy  - likely from sepsis.  Improving after coming  to the ER.   - CT head unremarkable, improving,   Demand ischemia - Also suspected from sepsis - Evaluated by cardiology - Follow-up echo   COPD exacerbation - Continue nebs - Continue steroids at low-dose and taper off quickly.   History of chronic HFrEF  - last EF measured was 30 to 35% on August 2023.  Status post ICD placement.  On amiodarone .  Continue Lasix  as tolerated and monitor, beta-blocker as tolerated along with as needed Lasix , due to underlying renal insufficiency not a candidate for ACE/ARB or Entresto..   Paroxysmal atrial fibrillation  - history of complete heart block status post pacemaker placement on Eliquis .   - Continue combination of beta-blocker, amiodarone  and Eliquis .   History of bipolar disorder  - on Depakote  - Valproic level 59    BPH  - on Flomax  and finasteride .   Chronic kidney disease stage III  - Currently renal function is close to baseline monitor with diuretics, currently stable         Condition - Extremely Guarded  Family Communication  : Brother [phone]  called on 04/25/2024 at 10:37 AM and message left  Code Status :  DNR  Consults  :  None  PUD Prophylaxis :     Procedures  :     TTE  1. Left ventricular ejection fraction, by estimation, is 30 to 35%. The left ventricle has moderately decreased function. The left ventricle demonstrates global hypokinesis. The left ventricular internal cavity size was severely dilated. There is moderate concentric left ventricular hypertrophy. Left ventricular diastolic function could not be evaluated.  2. Right ventricular systolic function is normal. The right ventricular size is normal. There is normal pulmonary artery systolic pressure.  3. Left atrial size was severely dilated.  4. Right atrial size was moderately dilated.  5. The mitral valve is degenerative. Trivial mitral valve regurgitation. No evidence of mitral stenosis.  6. The aortic valve is grossly normal. There is moderate  calcification of the aortic valve. There is mild thickening of the aortic valve. Aortic valve regurgitation is not visualized. Aortic valve sclerosis/calcification is present, without any evidence  of aortic stenosis.  7. The inferior vena cava is dilated in size with >50% respiratory variability, suggesting right atrial pressure of 8 mmHg. Comparison(s): No significant change from prior study  CT - 1. Left lower lobe pneumonia and/or aspiration. 2. No acute abnormality in the abdomen or pelvis. 3. Cholelithiasis. 4. Punctate nonobstructing stones in both kidneys. 5. Prostatomegaly. Aortic Atherosclerosis.       Disposition Plan  :    Status is: Inpatient    DVT Prophylaxis  :    apixaban  (ELIQUIS ) tablet 2.5 mg Start: 04/25/24 2200 apixaban  (ELIQUIS ) tablet 2.5 mg     Lab Results  Component Value Date   PLT 138 (L) 04/25/2024    Diet :  Diet Order             Diet Heart Room service appropriate? Yes; Fluid consistency: Thin  Diet effective now                    Inpatient Medications  Scheduled Meds:  amiodarone   100 mg Oral Daily   apixaban   2.5 mg Oral BID   atorvastatin   20 mg Oral QHS   budesonide  (PULMICORT ) nebulizer solution  0.25 mg Nebulization BID   divalproex   1,000 mg Oral Daily   doxycycline   100 mg Oral BID   finasteride   5 mg Oral QHS   furosemide   20 mg Intravenous Once   ipratropium-albuterol   3 mL Nebulization Q6H   metoprolol  tartrate  50 mg Oral BID   tamsulosin   0.8 mg Oral QPC supper   Continuous Infusions:  cefTRIAXone  (ROCEPHIN )  IV Stopped (04/24/24 1508)   PRN Meds:.acetaminophen  **OR** acetaminophen , ipratropium-albuterol   Antibiotics  :    Anti-infectives (From admission, onward)  Start     Dose/Rate Route Frequency Ordered Stop   04/25/24 2000  doxycycline  (VIBRA -TABS) tablet 100 mg        100 mg Oral 2 times daily 04/25/24 1016     04/24/24 1200  cefTRIAXone  (ROCEPHIN ) 2 g in sodium chloride  0.9 % 100 mL IVPB        2  g 200 mL/hr over 30 Minutes Intravenous Every 24 hours 04/24/24 0437 04/29/24 1159   04/24/24 0800  doxycycline  (VIBRAMYCIN ) 100 mg in sodium chloride  0.9 % 250 mL IVPB  Status:  Discontinued        100 mg 125 mL/hr over 120 Minutes Intravenous Every 12 hours 04/24/24 0437 04/25/24 1016   04/24/24 0000  ceFEPIme  (MAXIPIME ) 2 g in sodium chloride  0.9 % 100 mL IVPB        2 g 200 mL/hr over 30 Minutes Intravenous  Once 04/23/24 2353 04/24/24 0041   04/24/24 0000  vancomycin  (VANCOREADY) IVPB 1750 mg/350 mL        1,750 mg 175 mL/hr over 120 Minutes Intravenous  Once 04/23/24 2353 04/24/24 0240         Objective:   Vitals:   04/25/24 0622 04/25/24 0759 04/25/24 0815 04/25/24 0819  BP: (!) 140/79  (!) 140/85   Pulse: 81  67   Resp: (!) 26 (!) 26 (!) 22   Temp:   (!) 97.4 F (36.3 C)   TempSrc:   Oral Oral  SpO2: 90% 95% 94%   Weight:      Height:        Wt Readings from Last 3 Encounters:  04/23/24 78 kg  03/11/24 78 kg  01/31/24 78 kg     Intake/Output Summary (Last 24 hours) at 04/25/2024 1037 Last data filed at 04/24/2024 2338 Gross per 24 hour  Intake 351.14 ml  Output --  Net 351.14 ml     Physical Exam  Awake Alert, No new F.N deficits, Normal affect [place].AT,PERRAL Supple Neck, No JVD,   Symmetrical Chest wall movement, Good air movement bilaterally, coarse bibasilar breath sounds with some crackles and wheezes RRR,No Gallops,Rubs or new Murmurs,  +ve B.Sounds, Abd Soft, No tenderness,   No Cyanosis, Clubbing or edema      Data Review:    Recent Labs  Lab 04/23/24 2355 04/24/24 0448 04/25/24 0514  WBC 5.9 9.6 11.6*  HGB 15.4 12.8* 12.7*  HCT 48.0 39.8 39.2  PLT 123* 129* 138*  MCV 91.1 91.7 89.5  MCH 29.2 29.5 29.0  MCHC 32.1 32.2 32.4  RDW 15.1 15.0 15.1  LYMPHSABS 1.2 0.4* 0.7  MONOABS 0.5 0.4 0.8  EOSABS 0.0 0.0 0.0  BASOSABS 0.0 0.0 0.0    Recent Labs  Lab 04/23/24 2355 04/24/24 0005 04/24/24 0130 04/24/24 0448 04/24/24 1635  04/25/24 0514  NA 137  --   --  134*  --  138  K 4.2  --   --  4.2  --  4.4  CL 101  --   --  104  --  106  CO2 24  --   --  21*  --  22  ANIONGAP 12  --   --  9  --  10  GLUCOSE 125*  --   --  171*  --  91  BUN 32*  --   --  31*  --  36*  CREATININE 1.68*  --   --  1.77*  --  1.49*  AST 39  --   --  31  --   --   ALT 22  --   --  16  --   --   ALKPHOS 52  --   --  38  --   --   BILITOT 1.1  --   --  0.8  --   --   ALBUMIN 3.8  --   --  2.8*  --   --   CRP  --   --   --   --   --  7.0*  PROCALCITON  --   --   --   --   --  0.15  LATICACIDVEN  --  2.2* 3.1*  --   --   --   TSH  --   --   --  1.586  --   --   AMMONIA  --   --   --   --  13  --   BNP 798.5*  --   --   --   --  910.0*  MG  --   --   --  1.6*  --  1.8  CALCIUM  9.9  --   --  9.0  --  9.5      Recent Labs  Lab 04/23/24 2355 04/24/24 0005 04/24/24 0130 04/24/24 0448 04/24/24 1635 04/25/24 0514  CRP  --   --   --   --   --  7.0*  PROCALCITON  --   --   --   --   --  0.15  LATICACIDVEN  --  2.2* 3.1*  --   --   --   TSH  --   --   --  1.586  --   --   AMMONIA  --   --   --   --  13  --   BNP 798.5*  --   --   --   --  910.0*  MG  --   --   --  1.6*  --  1.8  CALCIUM  9.9  --   --  9.0  --  9.5    --------------------------------------------------------------------------------------------------------------- Lab Results  Component Value Date   CHOL 87 07/29/2021   HDL 34 (L) 07/29/2021   LDLCALC 40 07/29/2021   TRIG 63 07/29/2021   CHOLHDL 2.6 07/29/2021    Lab Results  Component Value Date   HGBA1C 6.2 (H) 07/31/2021   Recent Labs    04/24/24 0448  TSH 1.586   No results for input(s): VITAMINB12, FOLATE, FERRITIN, TIBC, IRON, RETICCTPCT in the last 72 hours. ------------------------------------------------------------------------------------------------------------------ Cardiac Enzymes No results for input(s): CKMB, TROPONINI, MYOGLOBIN in the last 168 hours.  Invalid  input(s): CK  Micro Results Recent Results (from the past 240 hours)  Blood culture (routine x 2)     Status: None (Preliminary result)   Collection Time: 04/23/24 11:41 PM   Specimen: BLOOD  Result Value Ref Range Status   Specimen Description BLOOD SITE NOT SPECIFIED  Final   Special Requests   Final    BOTTLES DRAWN AEROBIC AND ANAEROBIC Blood Culture results may not be optimal due to an inadequate volume of blood received in culture bottles   Culture  Setup Time   Final    GRAM POSITIVE COCCI ANAEROBIC BOTTLE ONLY CRITICAL RESULT CALLED TO, READ BACK BY AND VERIFIED WITH: PHARMD J WYLAND [id number] AT 945 AM BY CM Performed at [facility], [address] [place], [place] [address]    Culture GRAM POSITIVE  COCCI  Final   Report Status PENDING  Incomplete  Blood Culture ID Panel (Reflexed)     Status: Abnormal   Collection Time: 04/23/24 11:41 PM  Result Value Ref Range Status   Enterococcus faecalis NOT DETECTED NOT DETECTED Final   Enterococcus Faecium NOT DETECTED NOT DETECTED Final   Listeria monocytogenes NOT DETECTED NOT DETECTED Final   Staphylococcus species DETECTED (A) NOT DETECTED Final    Comment: CRITICAL RESULT CALLED TO, READ BACK BY AND VERIFIED WITH: PHARMD J WYLAND [id number] AT 645 AM BY CM    Staphylococcus aureus (BCID) NOT DETECTED NOT DETECTED Final   Staphylococcus epidermidis NOT DETECTED NOT DETECTED Final   Staphylococcus lugdunensis NOT DETECTED NOT DETECTED Final   Streptococcus species NOT DETECTED NOT DETECTED Final   Streptococcus agalactiae NOT DETECTED NOT DETECTED Final   Streptococcus pneumoniae NOT DETECTED NOT DETECTED Final   Streptococcus pyogenes NOT DETECTED NOT DETECTED Final   A.calcoaceticus-baumannii NOT DETECTED NOT DETECTED Final   Bacteroides fragilis NOT DETECTED NOT DETECTED Final   Enterobacterales NOT DETECTED NOT DETECTED Final   Enterobacter cloacae complex NOT DETECTED NOT DETECTED Final   Escherichia coli NOT  DETECTED NOT DETECTED Final   Klebsiella aerogenes NOT DETECTED NOT DETECTED Final   Klebsiella oxytoca NOT DETECTED NOT DETECTED Final   Klebsiella pneumoniae NOT DETECTED NOT DETECTED Final   Proteus species NOT DETECTED NOT DETECTED Final   Salmonella species NOT DETECTED NOT DETECTED Final   Serratia marcescens NOT DETECTED NOT DETECTED Final   Haemophilus influenzae NOT DETECTED NOT DETECTED Final   Neisseria meningitidis NOT DETECTED NOT DETECTED Final   Pseudomonas aeruginosa NOT DETECTED NOT DETECTED Final   Stenotrophomonas maltophilia NOT DETECTED NOT DETECTED Final   Candida albicans NOT DETECTED NOT DETECTED Final   Candida auris NOT DETECTED NOT DETECTED Final   Candida glabrata NOT DETECTED NOT DETECTED Final   Candida krusei NOT DETECTED NOT DETECTED Final   Candida parapsilosis NOT DETECTED NOT DETECTED Final   Candida tropicalis NOT DETECTED NOT DETECTED Final   Cryptococcus neoformans/gattii NOT DETECTED NOT DETECTED Final    Comment: Performed at [facility], [address] [place], [place] [address]  Blood culture (routine x 2)     Status: None (Preliminary result)   Collection Time: 04/23/24 11:46 PM   Specimen: BLOOD  Result Value Ref Range Status   Specimen Description BLOOD SITE NOT SPECIFIED  Final   Special Requests   Final    BOTTLES DRAWN AEROBIC AND ANAEROBIC Blood Culture results may not be optimal due to an inadequate volume of blood received in culture bottles   Culture   Final    NO GROWTH 1 DAY Performed at [facility], [address], [place], [place] [address]    Report Status PENDING  Incomplete  Resp panel by RT-PCR (RSV, Flu A&B, Covid) Anterior Nasal Swab     Status: None   Collection Time: 04/24/24 12:33 AM   Specimen: Anterior Nasal Swab  Result Value Ref Range Status   SARS Coronavirus 2 by RT PCR NEGATIVE NEGATIVE Final   Influenza A by PCR NEGATIVE NEGATIVE Final   Influenza B by PCR NEGATIVE NEGATIVE Final    Comment:  (NOTE) The Xpert Xpress SARS-CoV-2/FLU/RSV plus assay is intended as an aid in the diagnosis of influenza from Nasopharyngeal swab specimens and should not be used as a sole basis for treatment. Nasal washings and aspirates are unacceptable for Xpert Xpress SARS-CoV-2/FLU/RSV testing.  Fact Sheet for  Patients: BloggerCourse.com  Fact Sheet for Healthcare Providers: SeriousBroker.it  This test is not yet approved or cleared by the [place]  FDA and has been authorized for detection and/or diagnosis of SARS-CoV-2 by FDA under an Emergency Use Authorization (EUA). This EUA will remain in effect (meaning this test can be used) for the duration of the COVID-19 declaration under Section 564(b)(1) of the Act, 21 U.S.C. section 360bbb-3(b)(1), unless the authorization is terminated or revoked.     Resp Syncytial Virus by PCR NEGATIVE NEGATIVE Final    Comment: (NOTE) Fact Sheet for Patients: BloggerCourse.com  Fact Sheet for Healthcare Providers: SeriousBroker.it  This test is not yet approved or cleared by the [place]  FDA and has been authorized for detection and/or diagnosis of SARS-CoV-2 by FDA under an Emergency Use Authorization (EUA). This EUA will remain in effect (meaning this test can be used) for the duration of the COVID-19 declaration under Section 564(b)(1) of the Act, 21 U.S.C. section 360bbb-3(b)(1), unless the authorization is terminated or revoked.  Performed at [facility], [address] [place], [place] [address]   MRSA Next Gen by PCR, Nasal     Status: None   Collection Time: 04/25/24  6:25 AM   Specimen: Nasal Mucosa; Nasal Swab  Result Value Ref Range Status   MRSA by PCR Next Gen NOT DETECTED NOT DETECTED Final    Comment: (NOTE) The GeneXpert MRSA Assay (FDA approved for NASAL specimens only), is one component of a comprehensive MRSA  colonization surveillance program. It is not intended to diagnose MRSA infection nor to guide or monitor treatment for MRSA infections. Test performance is not FDA approved in patients less than 46 years old. Performed at [facility], [address], [place], [place] [address]     Radiology Report DG Chest Port 1 View Result Date: 04/25/2024 CLINICAL DATA:  Shortness of breath. EXAM: PORTABLE CHEST 1 VIEW COMPARISON:  04/23/2024 FINDINGS: Cardiopericardial silhouette is at upper limits of normal for size. The retrocardiac left base atelectasis or infiltrate. Right lung clear. Left permanent pacemaker/AICD again noted. No acute bony abnormality. Telemetry leads overlie the chest. IMPRESSION: Retrocardiac left base atelectasis or infiltrate. Electronically Signed   By: Camellia Candle M.D.   On: 04/25/2024 07:31   ECHOCARDIOGRAM COMPLETE Result Date: 04/24/2024    ECHOCARDIOGRAM REPORT   Patient Name:   Mike Gray Date of Exam: 04/24/2024 Medical Rec #:  [id number]     Height:       68.0 in Accession #:    [id number]    Weight:       172.0 lb Date of Birth:  11/14/1940    BSA:          1.917 m Patient Age:    82 years      BP:           108/62 mmHg Patient Gender: M             HR:           71 bpm. Exam Location:  Inpatient Procedure: 2D Echo, 3D Echo, Cardiac Doppler and Color Doppler (Both Spectral            and Color Flow Doppler were utilized during procedure). Indications:    Elevated Troponin  History:        Patient has prior history of Echocardiogram examinations, most                 recent 06/09/2022. CHF, Defibrillator; COPD  and Chronic Kidney                 Disease.  Sonographer:    Koleen Popper RDCS Referring Phys: [id number] ARSHAD N KAKRAKANDY IMPRESSIONS  1. Left ventricular ejection fraction, by estimation, is 30 to 35%. The left ventricle has moderately decreased function. The left ventricle demonstrates global hypokinesis. The left ventricular internal cavity size was severely  dilated. There is moderate concentric left ventricular hypertrophy. Left ventricular diastolic function could not be evaluated.  2. Right ventricular systolic function is normal. The right ventricular size is normal. There is normal pulmonary artery systolic pressure.  3. Left atrial size was severely dilated.  4. Right atrial size was moderately dilated.  5. The mitral valve is degenerative. Trivial mitral valve regurgitation. No evidence of mitral stenosis.  6. The aortic valve is grossly normal. There is moderate calcification of the aortic valve. There is mild thickening of the aortic valve. Aortic valve regurgitation is not visualized. Aortic valve sclerosis/calcification is present, without any evidence  of aortic stenosis.  7. The inferior vena cava is dilated in size with >50% respiratory variability, suggesting right atrial pressure of 8 mmHg. Comparison(s): No significant change from prior study. FINDINGS  Left Ventricle: Left ventricular ejection fraction, by estimation, is 30 to 35%. The left ventricle has moderately decreased function. The left ventricle demonstrates global hypokinesis. 3D ejection fraction reviewed and evaluated as part of the interpretation. Alternate measurement of EF is felt to be most reflective of LV function. The left ventricular internal cavity size was severely dilated. There is moderate concentric left ventricular hypertrophy. Abnormal (paradoxical) septal motion, consistent with RV pacemaker. Left ventricular diastolic function could not be evaluated due to paced rhythm. Left ventricular diastolic function could not be evaluated. Right Ventricle: The right ventricular size is normal. No increase in right ventricular wall thickness. Right ventricular systolic function is normal. There is normal pulmonary artery systolic pressure. The tricuspid regurgitant velocity is 2.35 m/s, and  with an assumed right atrial pressure of 8 mmHg, the estimated right ventricular systolic  pressure is 30.1 mmHg. Left Atrium: Left atrial size was severely dilated. Right Atrium: Right atrial size was moderately dilated. Pericardium: There is no evidence of pericardial effusion. Mitral Valve: The mitral valve is degenerative in appearance. Mild to moderate mitral annular calcification. Trivial mitral valve regurgitation. No evidence of mitral valve stenosis. Tricuspid Valve: The tricuspid valve is normal in structure. Tricuspid valve regurgitation is mild . No evidence of tricuspid stenosis. Aortic Valve: The aortic valve is grossly normal. There is moderate calcification of the aortic valve. There is mild thickening of the aortic valve. Aortic valve regurgitation is not visualized. Aortic valve sclerosis/calcification is present, without any evidence of aortic stenosis. Pulmonic Valve: The pulmonic valve was not well visualized. Pulmonic valve regurgitation is not visualized. No evidence of pulmonic stenosis. Aorta: The aortic root, ascending aorta, aortic arch and descending aorta are all structurally normal, with no evidence of dilitation or obstruction. Ascending aorta measurements are within normal limits for age when indexed to body surface area. Venous: The inferior vena cava is dilated in size with greater than 50% respiratory variability, suggesting right atrial pressure of 8 mmHg. IAS/Shunts: The atrial septum is grossly normal. Additional Comments: 3D was performed not requiring image post processing on an independent workstation and was abnormal. A device lead is visualized in the right atrium and right ventricle.  LEFT VENTRICLE PLAX 2D LVIDd:         6.60 cm  Diastology LVIDs:         5.00 cm      LV e' medial:    4.74 cm/s LV PW:         1.50 cm      LV E/e' medial:  27.0 LV IVS:        1.40 cm      LV e' lateral:   7.08 cm/s LVOT diam:     1.90 cm      LV E/e' lateral: 18.1 LV SV:         79 LV SV Index:   41 LVOT Area:     2.84 cm                              3D Volume EF: LV  Volumes (MOD)            3D EF:        40 % LV vol d, MOD A4C: 245.0 ml LV EDV:       276 ml LV vol s, MOD A4C: 131.0 ml LV ESV:       167 ml LV SV MOD A4C:     245.0 ml LV SV:        109 ml RIGHT VENTRICLE             IVC RV Basal diam:  4.60 cm     IVC diam: 2.40 cm RV Mid diam:    3.00 cm RV S prime:     12.90 cm/s TAPSE (M-mode): 1.8 cm LEFT ATRIUM              Index        RIGHT ATRIUM           Index LA diam:        4.40 cm  2.30 cm/m   RA Area:     26.70 cm LA Vol (A2C):   132.0 ml 68.87 ml/m  RA Volume:   84.60 ml  44.14 ml/m LA Vol (A4C):   100.0 ml 52.17 ml/m LA Biplane Vol: 115.0 ml 60.00 ml/m  AORTIC VALVE LVOT Vmax:   147.00 cm/s LVOT Vmean:  93.200 cm/s LVOT VTI:    0.277 m  AORTA Ao Root diam: 3.10 cm Ao Asc diam:  3.80 cm MITRAL VALVE                TRICUSPID VALVE MV Area (PHT): 3.27 cm     TR Peak grad:   22.1 mmHg MV Decel Time: 232 msec     TR Vmax:        235.00 cm/s MV E velocity: 128.00 cm/s MV A velocity: 47.30 cm/s   SHUNTS MV E/A ratio:  2.71         Systemic VTI:  0.28 m                             Systemic Diam: 1.90 cm Shelda Bruckner MD Electronically signed by Shelda Bruckner MD Signature Date/Time: 04/24/2024/3:19:53 PM    Final    CT HEAD WO CONTRAST ( ) Result Date: 04/24/2024 CLINICAL DATA:  83 year old male with altered mental status. Sepsis. EXAM: CT HEAD WITHOUT CONTRAST TECHNIQUE: Contiguous axial images were obtained from the base of the skull through the vertex without intravenous contrast. RADIATION DOSE REDUCTION: This exam was performed according to the departmental dose-optimization program which includes automated exposure  control, adjustment of the mA and/or kV according to patient size and/or use of iterative reconstruction technique. COMPARISON:  Head CT 07/19/2021. FINDINGS: Brain: Residual intravascular contrast from CT Chest, Abdomen, and Pelvis today reported separately. Stable cerebral volume since Jun 03, 2021. No midline shift, ventriculomegaly,  mass effect, evidence of mass lesion, intracranial hemorrhage or evidence of cortically based acute infarction. Chronic periventricular white matter hypodensity appears stable. Incidental chronic vascular calcifications in the bilateral basal ganglia and deep cerebellar nuclei. Vascular: Residual intravascular contrast from CTs earlier today. Calcified atherosclerosis at the skull base. Skull: Intact.  No acute osseous abnormality identified. Sinuses/Orbits: Bilateral paranasal sinus mucosal thickening and opacification is new from Jun 03, 2021, pronounced in the ethmoid sinuses. Tympanic cavities and mastoids remain well aerated. Other: No acute orbit or scalp soft tissue finding. IMPRESSION: 1. No acute intracranial abnormality identified. Residual intravascular contrast from CTs earlier today. 2. Bilateral paranasal sinus inflammation. Electronically Signed   By: VEAR Hurst M.D.   On: 04/24/2024 05:40   CT CHEST ABDOMEN PELVIS W CONTRAST Result Date: 04/24/2024 CLINICAL DATA:  Sepsis EXAM: CT CHEST, ABDOMEN, AND PELVIS WITH CONTRAST TECHNIQUE: Multidetector CT imaging of the chest, abdomen and pelvis was performed following the standard protocol during bolus administration of intravenous contrast. RADIATION DOSE REDUCTION: This exam was performed according to the departmental dose-optimization program which includes automated exposure control, adjustment of the mA and/or kV according to patient size and/or use of iterative reconstruction technique. CONTRAST:  75mL OMNIPAQUE  IOHEXOL  350 MG/ML SOLN COMPARISON:  CT chest 07/23/2021 radiograph 04/23/2024 CT abdomen pelvis 07/05/2020 FINDINGS: CT CHEST FINDINGS Cardiovascular: Cardiomegaly. No pericardial effusion. Left chest wall ICD. Aortic and coronary artery atherosclerotic calcification. No aortic dissection. Mediastinum/Nodes: Trachea and esophagus are unremarkable. No thoracic adenopathy. Lungs/Pleura: Emphysema. Respiratory motion obscures detail. Infiltrates in the  left lower lobe due to pneumonia and/or aspiration. No pleural effusion or pneumothorax. Musculoskeletal: No acute fracture. CT ABDOMEN PELVIS FINDINGS Hepatobiliary: Cholelithiasis. No evidence of acute cholecystitis. No biliary dilation. Unremarkable liver. Pancreas: Unremarkable. Spleen: Unremarkable. Adrenals/Urinary Tract: Normal adrenal glands. Punctate nonobstructing stones in both kidneys. No hydronephrosis. Unremarkable bladder. Stomach/Bowel: Normal caliber large and small bowel. Colonic diverticulosis without diverticulitis. Normal appendix. Stomach is within normal limits. Vascular/Lymphatic: Aortic atherosclerosis. No enlarged abdominal or pelvic lymph nodes. Reproductive: Enlarged prostate. Other: No free intraperitoneal air.  Trace free fluid in the pelvis. Musculoskeletal: No acute fracture. IMPRESSION: 1. Left lower lobe pneumonia and/or aspiration. 2. No acute abnormality in the abdomen or pelvis. 3. Cholelithiasis. 4. Punctate nonobstructing stones in both kidneys. 5. Prostatomegaly. Aortic Atherosclerosis (ICD10-I70.0) and Emphysema (ICD10-J43.9). Electronically Signed   By: Norman Gatlin M.D.   On: 04/24/2024 02:30   DG Chest 1 View Result Date: 04/24/2024 CLINICAL DATA:  Shortness of breath EXAM: CHEST  1 VIEW COMPARISON:  Radiograph 08/02/2021 FINDINGS: Stable cardiomegaly. Left chest wall ICD. Aortic atherosclerotic calcification. Prominent central pulmonary arteries. The left costophrenic angle is not included in the image. No focal consolidation, pleural effusion, or pneumothorax. IMPRESSION: No acute disease.  Cardiomegaly. Electronically Signed   By: Norman Gatlin M.D.   On: 04/24/2024 00:25     Signature  -   Lavada Stank M.D on 04/25/2024 at 10:37 AM   -  To page go to [email or web address]

## 2024-04-25 NOTE — Evaluation (Addendum)
 Clinical/Bedside Swallow Evaluation Patient Details  Name: Mike Gray MRN: [id number] Date of Birth: May 17, 1941  Today's Date: 04/25/2024 Time: SLP Start Time (ACUTE ONLY): 9187 SLP Stop Time (ACUTE ONLY): 0837 SLP Time Calculation (min) (ACUTE ONLY): 25 min  Past Medical History:  Past Medical History:  Diagnosis Date   Angiodysplasia    Atrial fibrillation (HCC)    CHF (congestive heart failure) (HCC)    COPD (chronic obstructive pulmonary disease) (HCC)    GI bleed    Hypertension    Presence of permanent cardiac pacemaker    Past Surgical History:  Past Surgical History:  Procedure Laterality Date   COLONOSCOPY     ICD GENERATOR CHANGEOUT N/A 07/20/2022   Procedure: ICD GENERATOR CHANGEOUT;  Surgeon: Inocencio Soyla Lunger, MD;  Location: [facility] INVASIVE CV LAB;  Service: Cardiovascular;  Laterality: N/A;   INGUINAL HERNIA REPAIR Right 12/14/2022   Procedure: OPEN RIGHT HERNIA REPAIR INGUINAL ADULT WITH MESH;  Surgeon: Rubin Calamity, MD;  Location: [facility] OR;  Service: General;  Laterality: Right;   PACEMAKER IMPLANT     HPI:  Mike Gray is a 83 y.o. male with history of chronic systolic heart failure, paroxysmal atrial fibrillation, COPD, complete heart block status post pacemaker placement, ICD placement, BPH, bipolar disorder was brought to the ER after patient's brother found that patient is getting more short of breath, weaker, and tremulous. Per chart has been eating poorly.  Initial concern for wide-complex tachycardia cardiology evaluated and felt that patient's rhythm was normal sinus and to treat for sepsis and started on sepsis protocol fluids. Found to have severe sepsis secondary to pna.CXR Retrocardiac left base atelectasis or infiltrate. MBS in 2022-single instance aspiration with initial sip with cough, invasion eliminated with chin tuck. Reg/thin, chin tuck recommended.    Assessment / Plan / Recommendation  Clinical Impression  Pt denied recent swallowing  difficulties. Oromotor abilities are within normal range and he has upper and lower dentures. He has a strong but congested volitional cough. Pt is mildy dyspneic which did not increase throughout evaluation. There were no s/s aspiration with consecutive straw sips thin, applesauce or solid. Mastication was swift with complete oral clearance. Pt's with COPD are higher risk for silent aspiration and given history of aspiration in 2022 and current pna an objective test would be warranted to fully assess oropharyngeal phase. Recommend continue regular, thin, pills with thin and MBS today. SLP Visit Diagnosis: Dysphagia, unspecified (R13.10)    Aspiration Risk  Mild aspiration risk    Diet Recommendation Regular;Thin liquid    Liquid Administration via: Straw;Cup Medication Administration: Whole meds with liquid Supervision: Patient able to self feed Postural Changes: Seated upright at 90 degrees    Other  Recommendations Oral Care Recommendations: Oral care BID     Assistance Recommended at Discharge    Functional Status Assessment Patient has had a recent decline in their functional status and demonstrates the ability to make significant improvements in function in a reasonable and predictable amount of time.  Frequency and Duration min 1 x/week  1 week       Prognosis Prognosis for improved oropharyngeal function: Good      Swallow Study   General Date of Onset: 04/25/24 HPI: Mike Gray is a 83 y.o. male with history of chronic systolic heart failure, paroxysmal atrial fibrillation, COPD, complete heart block status post pacemaker placement, ICD placement, BPH, bipolar disorder was brought to the ER after patient's brother found that patient is getting more short of breath, weaker,  and tremulous. Per chart has been eating poorly.  Initial concern for wide-complex tachycardia cardiology evaluated and felt that patient's rhythm was normal sinus and to treat for sepsis and started on  sepsis protocol fluids. Found to have severe sepsis secondary to pna.CXR Retrocardiac left base atelectasis or infiltrate. MBS in 2022-single instance aspiration with initial sip with cough, invasion eliminated with chin tuck. Reg/thin, chin tuck recommended. Type of Study: Bedside Swallow Evaluation Previous Swallow Assessment:  (see HPI) Diet Prior to this Study: Regular;Thin liquids (Level 0) Temperature Spikes Noted: No Respiratory Status: Nasal cannula History of Recent Intubation: No Behavior/Cognition: Alert;Pleasant mood;Cooperative Oral Cavity Assessment: Within Functional Limits Oral Care Completed by SLP: No Oral Cavity - Dentition: Dentures, top;Dentures, bottom Vision: Functional for self-feeding Self-Feeding Abilities: Able to feed self Patient Positioning: Upright in bed Baseline Vocal Quality: Normal Volitional Cough: Strong;Congested Volitional Swallow: Able to elicit    Oral/Motor/Sensory Function Overall Oral Motor/Sensory Function: Within functional limits   Ice Chips Ice chips: Not tested   Thin Liquid Thin Liquid: Within functional limits Presentation: Cup;Straw    Nectar Thick Nectar Thick Liquid: Not tested   Honey Thick Honey Thick Liquid: Not tested   Puree Puree: Not tested   Solid     Solid: Within functional limits      Dustin Olam Bull 04/25/2024,10:09 AM

## 2024-04-25 NOTE — Evaluation (Signed)
 Modified Barium Swallow Study  Patient Details  Name: Mike Gray MRN: [id number] Date of Birth: [birth year]  Today's Date: 04/25/2024  Modified Barium Swallow completed.  Full report located under Chart Review in the Imaging Section.  History of Present Illness Mike Gray is a 83 y.o. male with history of chronic systolic heart failure, paroxysmal atrial fibrillation, COPD, complete heart block status post pacemaker placement, ICD placement, BPH, bipolar disorder was brought to the ER after patient's brother found that patient is getting more short of breath, weaker, and tremulous. Per chart has been eating poorly.  Initial concern for wide-complex tachycardia cardiology evaluated and felt that patient's rhythm was normal sinus and to treat for sepsis and started on sepsis protocol fluids. Found to have severe sepsis secondary to pna.CXR Retrocardiac left base atelectasis or infiltrate. MBS in 2022-single instance aspiration with initial sip with cough, invasion eliminated with chin tuck. Reg/thin, chin tuck recommended.   Clinical Impression Pt has mild oral deficits and hand tremor during self-feeding that result in a little oral discoordination while accepting boluses, but for the most part he regains good oral control and has a functional swallow. This does contribute to occasional penetration with thin liquids that is trace and transient (PAS 2, considered to be normal in general and also noted in previous MBS to be pt's established baseline). There was a single episode of aspiration when drinking via straw, which happened as a result of oral residue that spilled posteriorly to the pharynx and into the trachea in between consecutive sips. This aspiration was silent (PAS 8), but pt denies ever using straws. Recommend that he continue with regular solids and thin liquids via cup.  Factors that may increase risk of adverse event in presence of aspiration Noe & Lianne 2021): Respiratory or  GI disease  Swallow Evaluation Recommendations Recommendations: PO diet PO Diet Recommendation: Regular;Thin liquids (Level 0) Liquid Administration via: Cup;No straw Medication Administration: Whole meds with liquid Supervision: Staff to assist with self-feeding Swallowing strategies  : Slow rate;Small bites/sips Postural changes: Position pt fully upright for meals Oral care recommendations: Oral care BID (2x/day)      Leita SAILOR., M.A. CCC-SLP Acute Rehabilitation Services Office: [phone]  Secure chat preferred  04/25/2024,1:48 PM

## 2024-04-25 NOTE — Progress Notes (Signed)
 Ok to change apixaban  to 2.5mg  BID due to age and scr. BL scr ~1.5 so this is likely his ongoing dose. Per Dr. Dennise Sergio Batch, PharmD, BCIDP, AAHIVP, CPP Infectious Disease Pharmacist 04/25/2024 10:14 AM

## 2024-04-25 NOTE — NC FL2 (Signed)
 [place]  MEDICAID FL2 LEVEL OF CARE FORM     IDENTIFICATION  Patient Name: Mike Gray Birthdate: 06/21/1941 Sex: male Admission Date (Current Location): 04/23/2024  [facility] and [place] Number:  Producer, television/film/video and Address:  [facility], [address] [place], [place] [address]      Provider Number: [id number]  Attending Physician Name and Address:  Dennise Lavada POUR, MD  Relative Name and Phone Number:  Jonnie, [place]   [phone]    Current Level of Care: Hospital Recommended Level of Care: Skilled Nursing Facility Prior Approval Number:    Date Approved/Denied:   PASRR Number: [id number]  Discharge Plan: SNF    Current Diagnoses: Patient Active Problem List   Diagnosis Date Noted   Sepsis (HCC) 04/24/2024   CKD (chronic kidney disease) stage 3, GFR 30-59 ml/min (HCC) 04/24/2024   Elevated troponin 04/24/2024   Long term (current) use of anticoagulants 11/20/2022   Lobar pneumonia (HCC) 08/02/2021   PAF (paroxysmal atrial fibrillation) (HCC) 07/19/2021   Increased ammonia level 07/19/2021   Paroxysmal ventricular tachycardia (HCC) 03/22/2021   Left ear impacted cerumen 03/08/2021   Benign prostatic hyperplasia with lower urinary tract symptoms 12/07/2020   Hearing loss 12/07/2020   Hypertensive heart failure (HCC) 12/07/2020   Pure hypercholesterolemia 12/07/2020   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 (HCC) 07/05/2020   COPD with acute exacerbation (HCC) 07/05/2020   Bipolar 1 disorder (HCC) 07/05/2020   Unspecified atrial fibrillation (HCC) 07/05/2020   Chronic systolic CHF (congestive heart failure) (HCC) 07/05/2020   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   History of complete heart block 07/05/2020   ICD (implantable cardioverter-defibrillator) in place 07/05/2020   Acute metabolic encephalopathy 07/04/2020    Orientation RESPIRATION BLADDER Height &  Weight     Self  O2 Incontinent Weight: 171 lb 15.3 oz (78 kg) Height:  5' 8 (172.7 cm)  BEHAVIORAL SYMPTOMS/MOOD NEUROLOGICAL BOWEL NUTRITION STATUS      Continent Diet (see discharge summary)  AMBULATORY STATUS COMMUNICATION OF NEEDS Skin   Limited Assist Verbally Normal                       Personal Care Assistance Level of Assistance  Bathing, Feeding, Dressing Bathing Assistance: Maximum assistance Feeding assistance: Limited assistance Dressing Assistance: Maximum assistance     Functional Limitations Info  Sight, Hearing, Speech Sight Info: Adequate Hearing Info: Impaired Speech Info: Adequate    SPECIAL CARE FACTORS FREQUENCY  PT (By licensed PT), OT (By licensed OT)     PT Frequency: 5x week OT Frequency: 5x week            Contractures Contractures Info: Not present    Additional Factors Info  Code Status, Allergies Code Status Info: DNR Allergies Info: NKA           Current Medications (04/25/2024):  This is the current hospital active medication list Current Facility-Administered Medications  Medication Dose Route Frequency Provider Last Rate Last Admin   acetaminophen  (TYLENOL ) tablet 650 mg  650 mg Oral Q6H PRN Franky Redia SAILOR, MD       Or   acetaminophen  (TYLENOL ) suppository 650 mg  650 mg Rectal Q6H PRN Franky Redia SAILOR, MD       amiodarone  (PACERONE ) tablet 100 mg  100 mg Oral Daily Franky Redia SAILOR, MD   100 mg at 04/25/24 0830   apixaban  (ELIQUIS ) tablet 2.5 mg  2.5 mg Oral  BID Pham, Minh Q, RPH-CPP       atorvastatin  (LIPITOR) tablet 20 mg  20 mg Oral QHS Franky Redia SAILOR, MD   20 mg at 04/24/24 2117   budesonide  (PULMICORT ) nebulizer solution 0.25 mg  0.25 mg Nebulization BID Kakrakandy, Arshad N, MD   0.25 mg at 04/25/24 0759   cefTRIAXone  (ROCEPHIN ) 2 g in sodium chloride  0.9 % 100 mL IVPB  2 g Intravenous Q24H Kakrakandy, Arshad N, MD 200 mL/hr at 04/25/24 1243 2 g at 04/25/24 1243   divalproex  (DEPAKOTE  ER) 24 hr  tablet 1,000 mg  1,000 mg Oral Daily Franky Redia SAILOR, MD   1,000 mg at 04/25/24 0831   doxycycline  (VIBRA -TABS) tablet 100 mg  100 mg Oral BID Pham, Minh Q, RPH-CPP       finasteride  (PROSCAR ) tablet 5 mg  5 mg Oral QHS Kakrakandy, Arshad N, MD   5 mg at 04/24/24 2118   ipratropium-albuterol  (DUONEB) 0.5-2.5 (3) MG/3ML nebulizer solution 3 mL  3 mL Nebulization Q2H PRN Franky Redia SAILOR, MD       ipratropium-albuterol  (DUONEB) 0.5-2.5 (3) MG/3ML nebulizer solution 3 mL  3 mL Nebulization Q6H Singh, Prashant K, MD   3 mL at 04/25/24 1505   magnesium  sulfate IVPB 4 g 100 mL  4 g Intravenous Once Singh, Prashant K, MD 50 mL/hr at 04/25/24 1340 4 g at 04/25/24 1340   methylPREDNISolone  sodium succinate (SOLU-MEDROL ) 40 mg/mL injection 40 mg  40 mg Intravenous Q24H Singh, Prashant K, MD   40 mg at 04/25/24 1237   metoprolol  tartrate (LOPRESSOR ) tablet 50 mg  50 mg Oral BID Singh, Prashant K, MD   50 mg at 04/25/24 9377   tamsulosin  (FLOMAX ) capsule 0.8 mg  0.8 mg Oral QPC supper Kakrakandy, Arshad N, MD   0.8 mg at 04/24/24 1800     Discharge Medications: Please see discharge summary for a list of discharge medications.  Relevant Imaging Results:  Relevant Lab Results:   Additional Information SSN:[id number]  Bridget Cordella Simmonds, LCSW

## 2024-04-25 NOTE — Progress Notes (Signed)
 PT Cancellation Note  Patient Details Name: Mike Gray MRN: [id number] DOB: Sep 26, 1941   Cancelled Treatment:    Reason Eval/Treat Not Completed: Other (comment) (Pt on the bedpan. Politely requesting PT to come back later. Will follow up if time allows.)   Landrum Carbonell 04/25/2024, 8:47 AM

## 2024-04-26 ENCOUNTER — Inpatient Hospital Stay (HOSPITAL_COMMUNITY)

## 2024-04-26 DIAGNOSIS — A419 Sepsis, unspecified organism: Secondary | ICD-10-CM | POA: Diagnosis not present

## 2024-04-26 LAB — CBC WITH DIFFERENTIAL/PLATELET
Abs Immature Granulocytes: 0.04 K/uL (ref 0.00–0.07)
Basophils Absolute: 0 K/uL (ref 0.0–0.1)
Basophils Relative: 0 %
Eosinophils Absolute: 0 K/uL (ref 0.0–0.5)
Eosinophils Relative: 0 %
HCT: 39.2 % (ref 39.0–52.0)
Hemoglobin: 13 g/dL (ref 13.0–17.0)
Immature Granulocytes: 0 %
Lymphocytes Relative: 6 %
Lymphs Abs: 0.6 K/uL — ABNORMAL LOW (ref 0.7–4.0)
MCH: 29.3 pg (ref 26.0–34.0)
MCHC: 33.2 g/dL (ref 30.0–36.0)
MCV: 88.5 fL (ref 80.0–100.0)
Monocytes Absolute: 0.5 K/uL (ref 0.1–1.0)
Monocytes Relative: 5 %
Neutro Abs: 8.8 K/uL — ABNORMAL HIGH (ref 1.7–7.7)
Neutrophils Relative %: 89 %
Platelets: 155 K/uL (ref 150–400)
RBC: 4.43 MIL/uL (ref 4.22–5.81)
RDW: 14.8 % (ref 11.5–15.5)
WBC: 10 K/uL (ref 4.0–10.5)
nRBC: 0 % (ref 0.0–0.2)

## 2024-04-26 LAB — BASIC METABOLIC PANEL WITH GFR
Anion gap: 9 (ref 5–15)
BUN: 38 mg/dL — ABNORMAL HIGH (ref 8–23)
CO2: 26 mmol/L (ref 22–32)
Calcium: 9.5 mg/dL (ref 8.9–10.3)
Chloride: 102 mmol/L (ref 98–111)
Creatinine, Ser: 1.47 mg/dL — ABNORMAL HIGH (ref 0.61–1.24)
GFR, Estimated: 47 mL/min — ABNORMAL LOW (ref >60)
Glucose, Bld: 102 mg/dL — ABNORMAL HIGH (ref 70–99)
Potassium: 4.2 mmol/L (ref 3.5–5.1)
Sodium: 137 mmol/L (ref 135–145)

## 2024-04-26 LAB — C-REACTIVE PROTEIN: CRP: 3.9 mg/dL — ABNORMAL HIGH (ref <1.0)

## 2024-04-26 LAB — BRAIN NATRIURETIC PEPTIDE: B Natriuretic Peptide: 2044.3 pg/mL — ABNORMAL HIGH (ref 0.0–100.0)

## 2024-04-26 LAB — MAGNESIUM: Magnesium: 2.3 mg/dL (ref 1.7–2.4)

## 2024-04-26 LAB — PROCALCITONIN: Procalcitonin: <0.1 ng/mL

## 2024-04-26 LAB — PHOSPHORUS: Phosphorus: 3.4 mg/dL (ref 2.5–4.6)

## 2024-04-26 MED ORDER — FUROSEMIDE 10 MG/ML IJ SOLN: 40.0000 mg | Freq: Once | INTRAMUSCULAR | Status: DC

## 2024-04-26 MED ORDER — IPRATROPIUM-ALBUTEROL 0.5-2.5 (3) MG/3ML IN SOLN
3.0000 mL | Freq: Three times a day (TID) | RESPIRATORY_TRACT | Status: DC
  Administered 2024-04-27 – 2024-04-29 (×7): 3 mL via RESPIRATORY_TRACT
  Filled 2024-04-26 (×8): qty 3

## 2024-04-26 MED ORDER — METHYLPREDNISOLONE SODIUM SUCC 40 MG IJ SOLR
20.0000 mg | INTRAMUSCULAR | Status: AC
  Administered 2024-04-26: 20 mg via INTRAVENOUS
  Filled 2024-04-26: qty 1

## 2024-04-26 MED ORDER — FUROSEMIDE 10 MG/ML IJ SOLN
20.0000 mg | Freq: Once | INTRAMUSCULAR | Status: AC
  Administered 2024-04-26: 20 mg via INTRAVENOUS
  Filled 2024-04-26: qty 2

## 2024-04-26 NOTE — Plan of Care (Signed)
  Problem: Clinical Measurements: Goal: Ability to maintain clinical measurements within normal limits will improve Outcome: Progressing Goal: Will remain free from infection Outcome: Progressing Goal: Diagnostic test results will improve Outcome: Progressing Goal: Respiratory complications will improve Outcome: Progressing Goal: Cardiovascular complication will be avoided Outcome: Progressing   Problem: Activity: Goal: Risk for activity intolerance will decrease Outcome: Progressing   Problem: Coping: Goal: Level of anxiety will decrease Outcome: Progressing   Problem: Pain Managment: Goal: General experience of comfort will improve and/or be controlled Outcome: Progressing   Problem: Skin Integrity: Goal: Risk for impaired skin integrity will decrease Outcome: Progressing   Problem: Activity: Goal: Ability to tolerate increased activity will improve Outcome: Progressing   Problem: Respiratory: Goal: Ability to maintain adequate ventilation will improve Outcome: Progressing Goal: Ability to maintain a clear airway will improve Outcome: Progressing

## 2024-04-26 NOTE — Plan of Care (Signed)

## 2024-04-26 NOTE — Progress Notes (Signed)
 PROGRESS NOTE                                                                                                                                                                                                             Patient Demographics:    Mike Gray, is a 83 y.o. male, DOB - [birth year], FMW:[id number]  Outpatient Primary MD for the patient is Regino, [name], MD    LOS - 2  Admit date - 04/23/2024    Chief Complaint  Patient presents with   Shortness of Breath       Brief Narrative (HPI from H&P)   83 y.o. male with history of chronic systolic heart failure, paroxysmal atrial fibrillation, COPD, complete heart block status post pacemaker placement, ICD placement, BPH, bipolar disorder was brought to the ER after patient's brother found that patient is getting more short of breath, weaker, and tremulous.   Patient has been getting progressively worsening shortness of breath with nonproductive cough over the last 2 weeks.  Has not had any chest pain.  Has been eating poorly.  In the last 2 days patient became more tremulous and weak and last night became more short of breath was brought to the ER.  Was diagnosed with pneumonia and admitted to the hospital   Subjective:    Mike Gray today has, No headache, No chest pain, No abdominal pain - No Nausea, No new weakness tingling or numbness, no SOB   Assessment  & Plan :    Severe sepsis secondary to pneumonia  - Fever, tachycardia, tachypnea, lactic acidosis.  Suspected lung source with pneumonia -Continue Rocephin  and doxycycline ,  Currently no wheezing, low-dose steroids started earlier will be tapered over the next few days, 1 out of 2 blood cultures likely contaminant.   Acute hypoxic respiratory failure - Does not appear to be on oxygen at baseline, treat underlying pneumonia and chronic systolic CHF.  As needed nebulizer treatments and supplemental oxygen.    Acute metabolic encephalopathy  - likely from sepsis, negative head CT no headache or focal deficits.  Much improved Demand ischemia -troponin trend flat and not in ACS pattern, no chest pain, likely demand mismatch from hypoxia caused by pneumonia, EF is depressed at around 30%, has AICD, global hypokinesis on echo, this is largely unchanged from previous echo in 2023.  Continue supportive  care with outpatient follow-up with his primary cardiologist postdischarge, continue beta-blocker, Eliquis , statin for secondary prevention.   COPD exacerbation - Continue nebs, taper of steroids quickly no wheezing.   History of chronic HFrEF  - last EF measured was 30 to 35% on August 2023.  Status post ICD placement.  On amiodarone .  Continue Lasix  as tolerated and monitor, beta-blocker as tolerated along with as needed Lasix , due to underlying renal insufficiency not a candidate for ACE/ARB or Entresto.   Paroxysmal atrial fibrillation - history of complete heart block status post pacemaker placement on Eliquis . Continue combination of beta-blocker, amiodarone  and Eliquis .   History of bipolar disorder  - on Depakote    BPH  - on Flomax  and finasteride .   Chronic kidney disease stage III, baseline creatinine around 1.5 - Currently renal function is close to baseline monitor with diuretics, currently stable      Condition - Extremely Guarded  Family Communication  : Brother [phone]  called on 04/25/2024 at 10:37 AM and message left  Code Status :  DNR  Consults  :  None  PUD Prophylaxis :     Procedures  :     TTE  1. Left ventricular ejection fraction, by estimation, is 30 to 35%. The left ventricle has moderately decreased function. The left ventricle demonstrates global hypokinesis. The left ventricular internal cavity size was severely dilated. There is moderate concentric left ventricular hypertrophy. Left ventricular diastolic function could not be evaluated.  2. Right ventricular  systolic function is normal. The right ventricular size is normal. There is normal pulmonary artery systolic pressure.  3. Left atrial size was severely dilated.  4. Right atrial size was moderately dilated.  5. The mitral valve is degenerative. Trivial mitral valve regurgitation. No evidence of mitral stenosis.  6. The aortic valve is grossly normal. There is moderate calcification of the aortic valve. There is mild thickening of the aortic valve. Aortic valve regurgitation is not visualized. Aortic valve sclerosis/calcification is present, without any evidence  of aortic stenosis.  7. The inferior vena cava is dilated in size with >50% respiratory variability, suggesting right atrial pressure of 8 mmHg. Comparison(s): No significant change from prior study  CT - 1. Left lower lobe pneumonia and/or aspiration. 2. No acute abnormality in the abdomen or pelvis. 3. Cholelithiasis. 4. Punctate nonobstructing stones in both kidneys. 5. Prostatomegaly. Aortic Atherosclerosis.       Disposition Plan  :    Status is: Inpatient    DVT Prophylaxis  :    apixaban  (ELIQUIS ) tablet 2.5 mg Start: 04/25/24 2200 apixaban  (ELIQUIS ) tablet 2.5 mg     Lab Results  Component Value Date   PLT 155 04/26/2024    Diet :  Diet Order             Diet Heart Room service appropriate? Yes; Fluid consistency: Thin  Diet effective now                    Inpatient Medications  Scheduled Meds:  amiodarone   100 mg Oral Daily   apixaban   2.5 mg Oral BID   atorvastatin   20 mg Oral QHS   budesonide  (PULMICORT ) nebulizer solution  0.25 mg Nebulization BID   divalproex   1,000 mg Oral Daily   doxycycline   100 mg Oral BID   finasteride   5 mg Oral QHS   ipratropium-albuterol   3 mL Nebulization Q6H   methylPREDNISolone  (SOLU-MEDROL ) injection  40 mg Intravenous Q24H   metoprolol  tartrate  50 mg Oral BID   tamsulosin   0.8 mg Oral QPC supper   Continuous Infusions:  cefTRIAXone  (ROCEPHIN )  IV Stopped  (04/25/24 1313)   PRN Meds:.acetaminophen  **OR** acetaminophen , ipratropium-albuterol    Objective:   Vitals:   04/25/24 1717 04/25/24 2000 04/26/24 0000 04/26/24 0400  BP: (!) 191/89 135/61 119/76 138/68  Pulse: 73 66 62 63  Resp: (!) 24 (!) 26 (!) 24 (!) 22  Temp: 99.3 F (37.4 C) 97.9 F (36.6 C)  97.7 F (36.5 C)  TempSrc: Oral   Oral  SpO2: 92% 96% 93% 93%  Weight:      Height:        Wt Readings from Last 3 Encounters:  04/23/24 78 kg  03/11/24 78 kg  01/31/24 78 kg     Intake/Output Summary (Last 24 hours) at 04/26/2024 0808 Last data filed at 04/26/2024 0400 Gross per 24 hour  Intake 100 ml  Output 1600 ml  Net -1500 ml     Physical Exam  Awake Alert, No new F.N deficits, Normal affect Corazon.AT,PERRAL Supple Neck, No JVD,   Symmetrical Chest wall movement, Good air movement bilaterally, coarse bibasilar breath sounds with some crackles and wheezes RRR,No Gallops,Rubs or new Murmurs,  +ve B.Sounds, Abd Soft, No tenderness,   No Cyanosis, Clubbing or edema      Data Review:    Recent Labs  Lab 04/23/24 2355 04/24/24 0448 04/25/24 0514 04/26/24 0432  WBC 5.9 9.6 11.6* 10.0  HGB 15.4 12.8* 12.7* 13.0  HCT 48.0 39.8 39.2 39.2  PLT 123* 129* 138* 155  MCV 91.1 91.7 89.5 88.5  MCH 29.2 29.5 29.0 29.3  MCHC 32.1 32.2 32.4 33.2  RDW 15.1 15.0 15.1 14.8  LYMPHSABS 1.2 0.4* 0.7 0.6*  MONOABS 0.5 0.4 0.8 0.5  EOSABS 0.0 0.0 0.0 0.0  BASOSABS 0.0 0.0 0.0 0.0    Recent Labs  Lab 04/23/24 2355 04/24/24 0005 04/24/24 0130 04/24/24 0448 04/24/24 1635 04/25/24 0514 04/26/24 0432  NA 137  --   --  134*  --  138 137  K 4.2  --   --  4.2  --  4.4 4.2  CL 101  --   --  104  --  106 102  CO2 24  --   --  21*  --  22 26  ANIONGAP 12  --   --  9  --  10 9  GLUCOSE 125*  --   --  171*  --  91 102*  BUN 32*  --   --  31*  --  36* 38*  CREATININE 1.68*  --   --  1.77*  --  1.49* 1.47*  AST 39  --   --  31  --   --   --   ALT 22  --   --  16  --   --   --    ALKPHOS 52  --   --  38  --   --   --   BILITOT 1.1  --   --  0.8  --   --   --   ALBUMIN 3.8  --   --  2.8*  --   --   --   CRP  --   --   --   --   --  7.0* 3.9*  PROCALCITON  --   --   --   --   --  0.15 <0.10  LATICACIDVEN  --  2.2* 3.1*  --   --   --   --  TSH  --   --   --  1.586  --   --   --   AMMONIA  --   --   --   --  13  --   --   BNP 798.5*  --   --   --   --  910.0* 2,044.3*  MG  --   --   --  1.6*  --  1.8 2.3  PHOS  --   --   --   --   --   --  3.4  CALCIUM  9.9  --   --  9.0  --  9.5 9.5      Recent Labs  Lab 04/23/24 2355 04/24/24 0005 04/24/24 0130 04/24/24 0448 04/24/24 1635 04/25/24 0514 04/26/24 0432  CRP  --   --   --   --   --  7.0* 3.9*  PROCALCITON  --   --   --   --   --  0.15 <0.10  LATICACIDVEN  --  2.2* 3.1*  --   --   --   --   TSH  --   --   --  1.586  --   --   --   AMMONIA  --   --   --   --  13  --   --   BNP 798.5*  --   --   --   --  910.0* 2,044.3*  MG  --   --   --  1.6*  --  1.8 2.3  CALCIUM  9.9  --   --  9.0  --  9.5 9.5    --------------------------------------------------------------------------------------------------------------- Lab Results  Component Value Date   CHOL 87 07/29/2021   HDL 34 (L) 07/29/2021   LDLCALC 40 07/29/2021   TRIG 63 07/29/2021   CHOLHDL 2.6 07/29/2021    Lab Results  Component Value Date   HGBA1C 6.2 (H) 07/31/2021   Recent Labs    04/24/24 0448  TSH 1.586   No results for input(s): VITAMINB12, FOLATE, FERRITIN, TIBC, IRON, RETICCTPCT in the last 72 hours. ------------------------------------------------------------------------------------------------------------------ Cardiac Enzymes No results for input(s): CKMB, TROPONINI, MYOGLOBIN in the last 168 hours.  Invalid input(s): CK  Micro Results Recent Results (from the past 240 hours)  Blood culture (routine x 2)     Status: None (Preliminary result)   Collection Time: 04/23/24 11:41 PM   Specimen: BLOOD  Result  Value Ref Range Status   Specimen Description BLOOD SITE NOT SPECIFIED  Final   Special Requests   Final    BOTTLES DRAWN AEROBIC AND ANAEROBIC Blood Culture results may not be optimal due to an inadequate volume of blood received in culture bottles   Culture  Setup Time   Final    GRAM POSITIVE COCCI ANAEROBIC BOTTLE ONLY CRITICAL RESULT CALLED TO, READ BACK BY AND VERIFIED WITH: PHARMD J WYLAND [id number] AT 945 AM BY CM    Culture   Final    GRAM POSITIVE COCCI IDENTIFICATION TO FOLLOW Performed at [facility], [address], [place], [place] [address]    Report Status PENDING  Incomplete  Blood Culture ID Panel (Reflexed)     Status: Abnormal   Collection Time: 04/23/24 11:41 PM  Result Value Ref Range Status   Enterococcus faecalis NOT DETECTED NOT DETECTED Final   Enterococcus Faecium NOT DETECTED NOT DETECTED Final   Listeria monocytogenes NOT DETECTED NOT DETECTED Final   Staphylococcus species DETECTED (A) NOT DETECTED  Final    Comment: CRITICAL RESULT CALLED TO, READ BACK BY AND VERIFIED WITH: PHARMD J WYLAND [id number] AT 645 AM BY CM    Staphylococcus aureus (BCID) NOT DETECTED NOT DETECTED Final   Staphylococcus epidermidis NOT DETECTED NOT DETECTED Final   Staphylococcus lugdunensis NOT DETECTED NOT DETECTED Final   Streptococcus species NOT DETECTED NOT DETECTED Final   Streptococcus agalactiae NOT DETECTED NOT DETECTED Final   Streptococcus pneumoniae NOT DETECTED NOT DETECTED Final   Streptococcus pyogenes NOT DETECTED NOT DETECTED Final   A.calcoaceticus-baumannii NOT DETECTED NOT DETECTED Final   Bacteroides fragilis NOT DETECTED NOT DETECTED Final   Enterobacterales NOT DETECTED NOT DETECTED Final   Enterobacter cloacae complex NOT DETECTED NOT DETECTED Final   Escherichia coli NOT DETECTED NOT DETECTED Final   Klebsiella aerogenes NOT DETECTED NOT DETECTED Final   Klebsiella oxytoca NOT DETECTED NOT DETECTED Final   Klebsiella pneumoniae NOT DETECTED NOT  DETECTED Final   Proteus species NOT DETECTED NOT DETECTED Final   Salmonella species NOT DETECTED NOT DETECTED Final   Serratia marcescens NOT DETECTED NOT DETECTED Final   Haemophilus influenzae NOT DETECTED NOT DETECTED Final   Neisseria meningitidis NOT DETECTED NOT DETECTED Final   Pseudomonas aeruginosa NOT DETECTED NOT DETECTED Final   Stenotrophomonas maltophilia NOT DETECTED NOT DETECTED Final   Candida albicans NOT DETECTED NOT DETECTED Final   Candida auris NOT DETECTED NOT DETECTED Final   Candida glabrata NOT DETECTED NOT DETECTED Final   Candida krusei NOT DETECTED NOT DETECTED Final   Candida parapsilosis NOT DETECTED NOT DETECTED Final   Candida tropicalis NOT DETECTED NOT DETECTED Final   Cryptococcus neoformans/gattii NOT DETECTED NOT DETECTED Final    Comment: Performed at [facility], [address] [place], [place] [address]  Blood culture (routine x 2)     Status: None (Preliminary result)   Collection Time: 04/23/24 11:46 PM   Specimen: BLOOD  Result Value Ref Range Status   Specimen Description BLOOD SITE NOT SPECIFIED  Final   Special Requests   Final    BOTTLES DRAWN AEROBIC AND ANAEROBIC Blood Culture results may not be optimal due to an inadequate volume of blood received in culture bottles   Culture   Final    NO GROWTH 2 DAYS Performed at [facility], [address] [place], [place] [address]    Report Status PENDING  Incomplete  Resp panel by RT-PCR (RSV, Flu A&B, Covid) Anterior Nasal Swab     Status: None   Collection Time: 04/24/24 12:33 AM   Specimen: Anterior Nasal Swab  Result Value Ref Range Status   SARS Coronavirus 2 by RT PCR NEGATIVE NEGATIVE Final   Influenza A by PCR NEGATIVE NEGATIVE Final   Influenza B by PCR NEGATIVE NEGATIVE Final    Comment: (NOTE) The Xpert Xpress SARS-CoV-2/FLU/RSV plus assay is intended as an aid in the diagnosis of influenza from Nasopharyngeal swab specimens and should not be used as a sole  basis for treatment. Nasal washings and aspirates are unacceptable for Xpert Xpress SARS-CoV-2/FLU/RSV testing.  Fact Sheet for Patients: BloggerCourse.com  Fact Sheet for Healthcare Providers: SeriousBroker.it  This test is not yet approved or cleared by the [place]  FDA and has been authorized for detection and/or diagnosis of SARS-CoV-2 by FDA under an Emergency Use Authorization (EUA). This EUA will remain in effect (meaning this test can be used) for the duration of the COVID-19 declaration under Section 564(b)(1) of the Act, 21 U.S.C. section 360bbb-3(b)(1),  unless the authorization is terminated or revoked.     Resp Syncytial Virus by PCR NEGATIVE NEGATIVE Final    Comment: (NOTE) Fact Sheet for Patients: BloggerCourse.com  Fact Sheet for Healthcare Providers: SeriousBroker.it  This test is not yet approved or cleared by the [place]  FDA and has been authorized for detection and/or diagnosis of SARS-CoV-2 by FDA under an Emergency Use Authorization (EUA). This EUA will remain in effect (meaning this test can be used) for the duration of the COVID-19 declaration under Section 564(b)(1) of the Act, 21 U.S.C. section 360bbb-3(b)(1), unless the authorization is terminated or revoked.  Performed at [facility], [address], [place], [place] [address]   MRSA Next Gen by PCR, Nasal     Status: None   Collection Time: 04/25/24  6:25 AM   Specimen: Nasal Mucosa; Nasal Swab  Result Value Ref Range Status   MRSA by PCR Next Gen NOT DETECTED NOT DETECTED Final    Comment: (NOTE) The GeneXpert MRSA Assay (FDA approved for NASAL specimens only), is one component of a comprehensive MRSA colonization surveillance program. It is not intended to diagnose MRSA infection nor to guide or monitor treatment for MRSA infections. Test performance is not FDA approved in  patients less than 15 years old. Performed at [facility], [address] [place], [place] [address]     Radiology Report DG Chest [place] 1 View Result Date: 04/26/2024 CLINICAL DATA:  83 year old male with shortness of breath. EXAM: PORTABLE CHEST 1 VIEW COMPARISON:  Portable chest yesterday and earlier. FINDINGS: Portable AP upright view at at 0704 hours. Cardiomegaly. Left chest cardiac pacemaker. Mildly lower lung volumes. Increased retrocardiac opacity on the left. Possible small left pleural effusion. Stable vascularity, no overt edema. No confluent right lung opacity. Visualized tracheal air column is within normal limits. No acute osseous abnormality identified. IMPRESSION: Cardiomegaly with lower lung volumes and increased left lung base hypo ventilation since yesterday. Possible small left pleural effusion. No overt edema. Electronically Signed   By: VEAR Hurst M.D.   On: 04/26/2024 07:31   DG Swallowing Func-Speech Pathology Result Date: 04/25/2024 Table formatting from the original result was not included. Modified Barium Swallow Study Patient Details Name: Branon Sabine MRN: [id number] Date of Birth: 1941/04/12 Today's Date: 04/25/2024 HPI/PMH: HPI: Kobyn Kray is a 83 y.o. male with history of chronic systolic heart failure, paroxysmal atrial fibrillation, COPD, complete heart block status post pacemaker placement, ICD placement, BPH, bipolar disorder was brought to the ER after patient's brother found that patient is getting more short of breath, weaker, and tremulous. Per chart has been eating poorly.  Initial concern for wide-complex tachycardia cardiology evaluated and felt that patient's rhythm was normal sinus and to treat for sepsis and started on sepsis protocol fluids. Found to have severe sepsis secondary to pna.CXR Retrocardiac left base atelectasis or infiltrate. MBS in 2022-single instance aspiration with initial sip with cough, invasion eliminated with chin tuck. Reg/thin, chin  tuck recommended. Clinical Impression: Clinical Impression: Pt has mild oral deficits and hand tremor during self-feeding that result in a little oral discoordination while accepting boluses, but for the most part he regains good oral control and has a functional swallow. This does contribute to occasional penetration with thin liquids that is trace and transient (PAS 2, considered to be normal in general and also noted in previous MBS to be pt's established baseline). There was a single episode of aspiration when drinking via straw, which happened as a  result of oral residue that spilled posteriorly to the pharynx and into the trachea in between consecutive sips. This aspiration was silent (PAS 8), but pt denies ever using straws. Recommend that he continue with regular solids and thin liquids via cup. Factors that may increase risk of adverse event in presence of aspiration Noe & Lianne 2021): Factors that may increase risk of adverse event in presence of aspiration Noe & Lianne 2021): Respiratory or GI disease Recommendations/Plan: Swallowing Evaluation Recommendations Swallowing Evaluation Recommendations Recommendations: PO diet PO Diet Recommendation: Regular; Thin liquids (Level 0) Liquid Administration via: Cup; No straw Medication Administration: Whole meds with liquid Supervision: Staff to assist with self-feeding Swallowing strategies  : Slow rate; Small bites/sips Postural changes: Position pt fully upright for meals Oral care recommendations: Oral care BID (2x/day) Treatment Plan Treatment Plan Treatment recommendations: No treatment recommended at this time Follow-up recommendations: No SLP follow up Functional status assessment: Patient has not had a recent decline in their functional status. Recommendations Recommendations for follow up therapy are one component of a multi-disciplinary discharge planning process, led by the attending physician.  Recommendations may be updated based on  patient status, additional functional criteria and insurance authorization. Assessment: Orofacial Exam: Orofacial Exam Oral Cavity - Dentition: Dentures, top; Dentures, bottom Anatomy: Anatomy: WFL Boluses Administered: Boluses Administered Boluses Administered: Thin liquids (Level 0); Mildly thick liquids (Level 2, nectar thick); Puree; Solid  Oral Impairment Domain: Oral Impairment Domain Lip Closure: No labial escape Tongue control during bolus hold: Cohesive bolus between tongue to palatal seal Bolus preparation/mastication: Timely and efficient chewing and mashing Bolus transport/lingual motion: Brisk tongue motion Oral residue: Trace residue lining oral structures Location of oral residue : Tongue; Floor of mouth; Lateral sulci Initiation of pharyngeal swallow : Valleculae  Pharyngeal Impairment Domain: Pharyngeal Impairment Domain Soft palate elevation: No bolus between soft palate (SP)/pharyngeal wall (PW) Laryngeal elevation: Complete superior movement of thyroid  cartilage with complete approximation of arytenoids to epiglottic petiole Anterior hyoid excursion: Partial anterior movement Epiglottic movement: Complete inversion Laryngeal vestibule closure: Complete, no air/contrast in laryngeal vestibule Pharyngeal stripping wave : Present - complete Pharyngeal contraction (A/P view only): N/A Pharyngoesophageal segment opening: Complete distension and complete duration, no obstruction of flow Tongue base retraction: Trace column of contrast or air between tongue base and PPW Pharyngeal residue: Complete pharyngeal clearance Location of pharyngeal residue: N/A  Esophageal Impairment Domain: Esophageal Impairment Domain Esophageal clearance upright position: Complete clearance, esophageal coating Pill: Pill Consistency administered: Thin liquids (Level 0) Thin liquids (Level 0): [facility] Penetration/Aspiration Scale Score: Penetration/Aspiration Scale Score 1.  Material does not enter airway: Mildly thick liquids  (Level 2, nectar thick); Puree; Solid; Pill 8.  Material enters airway, passes BELOW cords without attempt by patient to eject out (silent aspiration) : Thin liquids (Level 0) (x1 via straw from oral residue that spilled posteriorly) Compensatory Strategies: No data recorded  General Information: Caregiver present: No  Diet Prior to this Study: Regular; Thin liquids (Level 0)   Temperature : Normal   Respiratory Status: WFL   Supplemental O2: Nasal cannula   History of Recent Intubation: No  Behavior/Cognition: Alert; Pleasant mood; Cooperative Self-Feeding Abilities: Needs assist with self-feeding (tremulous) Baseline vocal quality/speech: Normal Volitional Cough: Able to elicit Volitional Swallow: Able to elicit Exam Limitations: No limitations Goal Planning: Prognosis for improved oropharyngeal function: Good No data recorded No data recorded Patient/Family Stated Goal: none stated Consulted and agree with results and recommendations: Patient Pain: Pain Assessment Pain Assessment: No/denies pain Breathing: 0 Negative  Vocalization: 0 Facial Expression: 0 Body Language: 0 Consolability: 0 PAINAD Score: 0 End of Session: Start Time:SLP Start Time (ACUTE ONLY): 1141 Stop Time: SLP Stop Time (ACUTE ONLY): 1156 Time Calculation:SLP Time Calculation (min) (ACUTE ONLY): 15 min Charges: SLP Evaluations $ SLP Speech Visit: 1 Visit SLP Evaluations $BSS Swallow: 1 Procedure $MBS Swallow: 1 Procedure SLP visit diagnosis: SLP Visit Diagnosis: Dysphagia, unspecified (R13.10) Past Medical History: Past Medical History: Diagnosis Date  Angiodysplasia   Atrial fibrillation (HCC)   CHF (congestive heart failure) (HCC)   COPD (chronic obstructive pulmonary disease) (HCC)   GI bleed   Hypertension   Presence of permanent cardiac pacemaker  Past Surgical History: Past Surgical History: Procedure Laterality Date  COLONOSCOPY    ICD GENERATOR CHANGEOUT N/A 07/20/2022  Procedure: ICD GENERATOR CHANGEOUT;  Surgeon: Inocencio Soyla Lunger,  MD;  Location: [facility] INVASIVE CV LAB;  Service: Cardiovascular;  Laterality: N/A;  INGUINAL HERNIA REPAIR Right 12/14/2022  Procedure: OPEN RIGHT HERNIA REPAIR INGUINAL ADULT WITH MESH;  Surgeon: Rubin Calamity, MD;  Location: [facility] OR;  Service: General;  Laterality: Right;  PACEMAKER IMPLANT   Leita SAILOR., M.A. CCC-SLP Acute Rehabilitation Services Office: [phone] Secure chat preferred 04/25/2024, 1:49 PM  DG Chest Port 1 View Result Date: 04/25/2024 CLINICAL DATA:  Shortness of breath. EXAM: PORTABLE CHEST 1 VIEW COMPARISON:  04/23/2024 FINDINGS: Cardiopericardial silhouette is at upper limits of normal for size. The retrocardiac left base atelectasis or infiltrate. Right lung clear. Left permanent pacemaker/AICD again noted. No acute bony abnormality. Telemetry leads overlie the chest. IMPRESSION: Retrocardiac left base atelectasis or infiltrate. Electronically Signed   By: Camellia Candle M.D.   On: 04/25/2024 07:31   ECHOCARDIOGRAM COMPLETE Result Date: 04/24/2024    ECHOCARDIOGRAM REPORT   Patient Name:   WILHELM GANAWAY Date of Exam: 04/24/2024 Medical Rec #:  [id number]     Height:       68.0 in Accession #:    [id number]    Weight:       172.0 lb Date of Birth:  08-Feb-1941    BSA:          1.917 m Patient Age:    82 years      BP:           108/62 mmHg Patient Gender: M             HR:           71 bpm. Exam Location:  Inpatient Procedure: 2D Echo, 3D Echo, Cardiac Doppler and Color Doppler (Both Spectral            and Color Flow Doppler were utilized during procedure). Indications:    Elevated Troponin  History:        Patient has prior history of Echocardiogram examinations, most                 recent 06/09/2022. CHF, Defibrillator; COPD and Chronic Kidney                 Disease.  Sonographer:    Koleen Popper RDCS Referring Phys: [id number] ARSHAD N KAKRAKANDY IMPRESSIONS  1. Left ventricular ejection fraction, by estimation, is 30 to 35%. The left ventricle has moderately decreased function. The left ventricle  demonstrates global hypokinesis. The left ventricular internal cavity size was severely dilated. There is moderate concentric left ventricular hypertrophy. Left ventricular diastolic function could not be evaluated.  2. Right ventricular systolic function is normal. The right ventricular size is normal. There is normal  pulmonary artery systolic pressure.  3. Left atrial size was severely dilated.  4. Right atrial size was moderately dilated.  5. The mitral valve is degenerative. Trivial mitral valve regurgitation. No evidence of mitral stenosis.  6. The aortic valve is grossly normal. There is moderate calcification of the aortic valve. There is mild thickening of the aortic valve. Aortic valve regurgitation is not visualized. Aortic valve sclerosis/calcification is present, without any evidence  of aortic stenosis.  7. The inferior vena cava is dilated in size with >50% respiratory variability, suggesting right atrial pressure of 8 mmHg. Comparison(s): No significant change from prior study. FINDINGS  Left Ventricle: Left ventricular ejection fraction, by estimation, is 30 to 35%. The left ventricle has moderately decreased function. The left ventricle demonstrates global hypokinesis. 3D ejection fraction reviewed and evaluated as part of the interpretation. Alternate measurement of EF is felt to be most reflective of LV function. The left ventricular internal cavity size was severely dilated. There is moderate concentric left ventricular hypertrophy. Abnormal (paradoxical) septal motion, consistent with RV pacemaker. Left ventricular diastolic function could not be evaluated due to paced rhythm. Left ventricular diastolic function could not be evaluated. Right Ventricle: The right ventricular size is normal. No increase in right ventricular wall thickness. Right ventricular systolic function is normal. There is normal pulmonary artery systolic pressure. The tricuspid regurgitant velocity is 2.35 m/s, and  with an  assumed right atrial pressure of 8 mmHg, the estimated right ventricular systolic pressure is 30.1 mmHg. Left Atrium: Left atrial size was severely dilated. Right Atrium: Right atrial size was moderately dilated. Pericardium: There is no evidence of pericardial effusion. Mitral Valve: The mitral valve is degenerative in appearance. Mild to moderate mitral annular calcification. Trivial mitral valve regurgitation. No evidence of mitral valve stenosis. Tricuspid Valve: The tricuspid valve is normal in structure. Tricuspid valve regurgitation is mild . No evidence of tricuspid stenosis. Aortic Valve: The aortic valve is grossly normal. There is moderate calcification of the aortic valve. There is mild thickening of the aortic valve. Aortic valve regurgitation is not visualized. Aortic valve sclerosis/calcification is present, without any evidence of aortic stenosis. Pulmonic Valve: The pulmonic valve was not well visualized. Pulmonic valve regurgitation is not visualized. No evidence of pulmonic stenosis. Aorta: The aortic root, ascending aorta, aortic arch and descending aorta are all structurally normal, with no evidence of dilitation or obstruction. Ascending aorta measurements are within normal limits for age when indexed to body surface area. Venous: The inferior vena cava is dilated in size with greater than 50% respiratory variability, suggesting right atrial pressure of 8 mmHg. IAS/Shunts: The atrial septum is grossly normal. Additional Comments: 3D was performed not requiring image post processing on an independent workstation and was abnormal. A device lead is visualized in the right atrium and right ventricle.  LEFT VENTRICLE PLAX 2D LVIDd:         6.60 cm      Diastology LVIDs:         5.00 cm      LV e' medial:    4.74 cm/s LV PW:         1.50 cm      LV E/e' medial:  27.0 LV IVS:        1.40 cm      LV e' lateral:   7.08 cm/s LVOT diam:     1.90 cm      LV E/e' lateral: 18.1 LV SV:  79 LV SV Index:    41 LVOT Area:     2.84 cm                              3D Volume EF: LV Volumes (MOD)            3D EF:        40 % LV vol d, MOD A4C: 245.0 ml LV EDV:       276 ml LV vol s, MOD A4C: 131.0 ml LV ESV:       167 ml LV SV MOD A4C:     245.0 ml LV SV:        109 ml RIGHT VENTRICLE             IVC RV Basal diam:  4.60 cm     IVC diam: 2.40 cm RV Mid diam:    3.00 cm RV S prime:     12.90 cm/s TAPSE (M-mode): 1.8 cm LEFT ATRIUM              Index        RIGHT ATRIUM           Index LA diam:        4.40 cm  2.30 cm/m   RA Area:     26.70 cm LA Vol (A2C):   132.0 ml 68.87 ml/m  RA Volume:   84.60 ml  44.14 ml/m LA Vol (A4C):   100.0 ml 52.17 ml/m LA Biplane Vol: 115.0 ml 60.00 ml/m  AORTIC VALVE LVOT Vmax:   147.00 cm/s LVOT Vmean:  93.200 cm/s LVOT VTI:    0.277 m  AORTA Ao Root diam: 3.10 cm Ao Asc diam:  3.80 cm MITRAL VALVE                TRICUSPID VALVE MV Area (PHT): 3.27 cm     TR Peak grad:   22.1 mmHg MV Decel Time: 232 msec     TR Vmax:        235.00 cm/s MV E velocity: 128.00 cm/s MV A velocity: 47.30 cm/s   SHUNTS MV E/A ratio:  2.71         Systemic VTI:  0.28 m                             Systemic Diam: 1.90 cm Shelda Bruckner MD Electronically signed by Shelda Bruckner MD Signature Date/Time: 04/24/2024/3:19:53 PM    Final      Signature  -   Lavada Stank M.D on 04/26/2024 at 8:08 AM   -  To page go to [email or web address]

## 2024-04-27 DIAGNOSIS — A419 Sepsis, unspecified organism: Secondary | ICD-10-CM | POA: Diagnosis not present

## 2024-04-27 LAB — CULTURE, BLOOD (ROUTINE X 2)

## 2024-04-27 NOTE — Progress Notes (Signed)
 PROGRESS NOTE                                                                                                                                                                                                             Patient Demographics:    Mike Gray, is a 83 y.o. male, DOB - Nov 14, 1940, FMW:[id number]  Outpatient Primary MD for the patient is Regino, [name], MD    LOS - 3  Admit date - 04/23/2024    Chief Complaint  Patient presents with   Shortness of Breath       Brief Narrative (HPI from H&P)   83 y.o. male with history of chronic systolic heart failure, paroxysmal atrial fibrillation, COPD, complete heart block status post pacemaker placement, ICD placement, BPH, bipolar disorder was brought to the ER after patient's brother found that patient is getting more short of breath, weaker, and tremulous.   Patient has been getting progressively worsening shortness of breath with nonproductive cough over the last 2 weeks.  Has not had any chest pain.  Has been eating poorly.  In the last 2 days patient became more tremulous and weak and last night became more short of breath was brought to the ER.  Was diagnosed with pneumonia and admitted to the hospital   Subjective:    Patient in bed, appears comfortable, denies any headache, no fever, no chest pain or pressure, no shortness of breath , no abdominal pain. No new focal weakness.   Assessment  & Plan :    Severe sepsis secondary to pneumonia  - Fever, tachycardia, tachypnea, lactic acidosis.  Suspected lung source with pneumonia. Continue Rocephin  and doxycycline ,  Currently no wheezing, low-dose steroids started earlier will be tapered over the next few days, 1 out of 2 blood cultures likely contaminant.  If stable likely discharge in the next 1 to 2 days.   Acute hypoxic respiratory failure - Does not appear to be on oxygen at baseline, treat underlying pneumonia and  chronic systolic CHF.  As needed nebulizer treatments and supplemental oxygen.   Acute metabolic encephalopathy  - likely from sepsis, negative head CT no headache or focal deficits.  Much improved  Demand ischemia -troponin trend flat and not in ACS pattern, no chest pain, likely demand mismatch from hypoxia caused by pneumonia, EF is depressed at around 30%, has  AICD, global hypokinesis on echo, this is largely unchanged from previous echo in 2023.  Continue supportive care with outpatient follow-up with his primary cardiologist postdischarge, continue beta-blocker, Eliquis , statin for secondary prevention.   COPD exacerbation - Continue nebs, taper of steroids quickly no wheezing.   History of chronic HFrEF  - last EF measured was 30 to 35% on August 2023.  Status post ICD placement.  On amiodarone .  Continue Lasix  as tolerated and monitor, beta-blocker as tolerated along with as needed Lasix , due to underlying renal insufficiency not a candidate for ACE/ARB or Entresto.   Paroxysmal atrial fibrillation - history of complete heart block status post pacemaker placement on Eliquis . Continue combination of beta-blocker, amiodarone  and Eliquis .   History of bipolar disorder  - on Depakote    BPH  - on Flomax  and finasteride .   Chronic kidney disease stage III, baseline creatinine around 1.5 - Currently renal function is close to baseline monitor with diuretics, currently stable      Condition - Extremely Guarded  Family Communication  : Brother [phone]  called on 04/25/2024 at 10:37 AM and message left  Code Status :  DNR  Consults  :  None  PUD Prophylaxis :     Procedures  :     TTE  1. Left ventricular ejection fraction, by estimation, is 30 to 35%. The left ventricle has moderately decreased function. The left ventricle demonstrates global hypokinesis. The left ventricular internal cavity size was severely dilated. There is moderate concentric left ventricular hypertrophy. Left  ventricular diastolic function could not be evaluated.  2. Right ventricular systolic function is normal. The right ventricular size is normal. There is normal pulmonary artery systolic pressure.  3. Left atrial size was severely dilated.  4. Right atrial size was moderately dilated.  5. The mitral valve is degenerative. Trivial mitral valve regurgitation. No evidence of mitral stenosis.  6. The aortic valve is grossly normal. There is moderate calcification of the aortic valve. There is mild thickening of the aortic valve. Aortic valve regurgitation is not visualized. Aortic valve sclerosis/calcification is present, without any evidence  of aortic stenosis.  7. The inferior vena cava is dilated in size with >50% respiratory variability, suggesting right atrial pressure of 8 mmHg. Comparison(s): No significant change from prior study  CT - 1. Left lower lobe pneumonia and/or aspiration. 2. No acute abnormality in the abdomen or pelvis. 3. Cholelithiasis. 4. Punctate nonobstructing stones in both kidneys. 5. Prostatomegaly. Aortic Atherosclerosis.       Disposition Plan  :    Status is: Inpatient    DVT Prophylaxis  :    apixaban  (ELIQUIS ) tablet 2.5 mg Start: 04/25/24 2200 apixaban  (ELIQUIS ) tablet 2.5 mg     Lab Results  Component Value Date   PLT 155 04/26/2024    Diet :  Diet Order             Diet Heart Room service appropriate? Yes; Fluid consistency: Thin  Diet effective now                    Inpatient Medications  Scheduled Meds:  amiodarone   100 mg Oral Daily   apixaban   2.5 mg Oral BID   atorvastatin   20 mg Oral QHS   budesonide  (PULMICORT ) nebulizer solution  0.25 mg Nebulization BID   divalproex   1,000 mg Oral Daily   doxycycline   100 mg Oral BID   finasteride   5 mg Oral QHS   ipratropium-albuterol   3 mL  Nebulization TID   metoprolol  tartrate  50 mg Oral BID   tamsulosin   0.8 mg Oral QPC supper   Continuous Infusions:  cefTRIAXone  (ROCEPHIN )  IV  Stopped (04/26/24 1252)   PRN Meds:.acetaminophen  **OR** acetaminophen , ipratropium-albuterol    Objective:   Vitals:   04/26/24 2020 04/26/24 2100 04/27/24 0000 04/27/24 0400  BP: 125/63   136/76  Pulse:      Resp: (!) 23   15  Temp: 97.7 F (36.5 C)  97.9 F (36.6 C) (!) 97.5 F (36.4 C)  TempSrc: Oral  Oral Oral  SpO2: 93% 97%  97%  Weight:      Height:        Wt Readings from Last 3 Encounters:  04/23/24 78 kg  03/11/24 78 kg  01/31/24 78 kg     Intake/Output Summary (Last 24 hours) at 04/27/2024 0804 Last data filed at 04/27/2024 0015 Gross per 24 hour  Intake --  Output 1500 ml  Net -1500 ml     Physical Exam  Awake Alert, No new F.N deficits, Normal affect [place].AT,PERRAL Supple Neck, No JVD,   Symmetrical Chest wall movement, Good air movement bilaterally, coarse bibasilar breath sounds with some crackles and wheezes RRR,No Gallops,Rubs or new Murmurs,  +ve B.Sounds, Abd Soft, No tenderness,   No Cyanosis, Clubbing or edema      Data Review:    Recent Labs  Lab 04/23/24 2355 04/24/24 0448 04/25/24 0514 04/26/24 0432  WBC 5.9 9.6 11.6* 10.0  HGB 15.4 12.8* 12.7* 13.0  HCT 48.0 39.8 39.2 39.2  PLT 123* 129* 138* 155  MCV 91.1 91.7 89.5 88.5  MCH 29.2 29.5 29.0 29.3  MCHC 32.1 32.2 32.4 33.2  RDW 15.1 15.0 15.1 14.8  LYMPHSABS 1.2 0.4* 0.7 0.6*  MONOABS 0.5 0.4 0.8 0.5  EOSABS 0.0 0.0 0.0 0.0  BASOSABS 0.0 0.0 0.0 0.0    Recent Labs  Lab 04/23/24 2355 04/24/24 0005 04/24/24 0130 04/24/24 0448 04/24/24 1635 04/25/24 0514 04/26/24 0432  NA 137  --   --  134*  --  138 137  K 4.2  --   --  4.2  --  4.4 4.2  CL 101  --   --  104  --  106 102  CO2 24  --   --  21*  --  22 26  ANIONGAP 12  --   --  9  --  10 9  GLUCOSE 125*  --   --  171*  --  91 102*  BUN 32*  --   --  31*  --  36* 38*  CREATININE 1.68*  --   --  1.77*  --  1.49* 1.47*  AST 39  --   --  31  --   --   --   ALT 22  --   --  16  --   --   --   ALKPHOS 52  --   --  38  --    --   --   BILITOT 1.1  --   --  0.8  --   --   --   ALBUMIN 3.8  --   --  2.8*  --   --   --   CRP  --   --   --   --   --  7.0* 3.9*  PROCALCITON  --   --   --   --   --  0.15 <0.10  LATICACIDVEN  --  2.2* 3.1*  --   --   --   --   TSH  --   --   --  1.586  --   --   --   AMMONIA  --   --   --   --  13  --   --   BNP 798.5*  --   --   --   --  910.0* 2,044.3*  MG  --   --   --  1.6*  --  1.8 2.3  PHOS  --   --   --   --   --   --  3.4  CALCIUM  9.9  --   --  9.0  --  9.5 9.5      Recent Labs  Lab 04/23/24 2355 04/24/24 0005 04/24/24 0130 04/24/24 0448 04/24/24 1635 04/25/24 0514 04/26/24 0432  CRP  --   --   --   --   --  7.0* 3.9*  PROCALCITON  --   --   --   --   --  0.15 <0.10  LATICACIDVEN  --  2.2* 3.1*  --   --   --   --   TSH  --   --   --  1.586  --   --   --   AMMONIA  --   --   --   --  13  --   --   BNP 798.5*  --   --   --   --  910.0* 2,044.3*  MG  --   --   --  1.6*  --  1.8 2.3  CALCIUM  9.9  --   --  9.0  --  9.5 9.5    --------------------------------------------------------------------------------------------------------------- Lab Results  Component Value Date   CHOL 87 07/29/2021   HDL 34 (L) 07/29/2021   LDLCALC 40 07/29/2021   TRIG 63 07/29/2021   CHOLHDL 2.6 07/29/2021    Lab Results  Component Value Date   HGBA1C 6.2 (H) 07/31/2021   No results for input(s): TSH, T4TOTAL, FREET4, T3FREE, THYROIDAB in the last 72 hours.  No results for input(s): VITAMINB12, FOLATE, FERRITIN, TIBC, IRON, RETICCTPCT in the last 72 hours. ------------------------------------------------------------------------------------------------------------------ Cardiac Enzymes No results for input(s): CKMB, TROPONINI, MYOGLOBIN in the last 168 hours.  Invalid input(s): CK  Micro Results Recent Results (from the past 240 hours)  Blood culture (routine x 2)     Status: Abnormal   Collection Time: 04/23/24 11:41 PM   Specimen: BLOOD   Result Value Ref Range Status   Specimen Description BLOOD SITE NOT SPECIFIED  Final   Special Requests   Final    BOTTLES DRAWN AEROBIC AND ANAEROBIC Blood Culture results may not be optimal due to an inadequate volume of blood received in culture bottles   Culture  Setup Time   Final    GRAM POSITIVE COCCI ANAEROBIC BOTTLE ONLY CRITICAL RESULT CALLED TO, READ BACK BY AND VERIFIED WITH: PHARMD J WYLAND [id number] AT 945 AM BY CM    Culture (A)  Final    STAPHYLOCOCCUS CAPITIS THE SIGNIFICANCE OF ISOLATING THIS ORGANISM FROM A SINGLE SET OF BLOOD CULTURES WHEN MULTIPLE SETS ARE DRAWN IS UNCERTAIN. PLEASE NOTIFY THE MICROBIOLOGY DEPARTMENT WITHIN ONE WEEK IF SPECIATION AND SENSITIVITIES ARE REQUIRED. Performed at [facility], [address], [place], [place] [address]    Report Status 04/27/2024 FINAL  Final  Blood Culture ID Panel (Reflexed)     Status: Abnormal  Collection Time: 04/23/24 11:41 PM  Result Value Ref Range Status   Enterococcus faecalis NOT DETECTED NOT DETECTED Final   Enterococcus Faecium NOT DETECTED NOT DETECTED Final   Listeria monocytogenes NOT DETECTED NOT DETECTED Final   Staphylococcus species DETECTED (A) NOT DETECTED Final    Comment: CRITICAL RESULT CALLED TO, READ BACK BY AND VERIFIED WITH: PHARMD J WYLAND [id number] AT 645 AM BY CM    Staphylococcus aureus (BCID) NOT DETECTED NOT DETECTED Final   Staphylococcus epidermidis NOT DETECTED NOT DETECTED Final   Staphylococcus lugdunensis NOT DETECTED NOT DETECTED Final   Streptococcus species NOT DETECTED NOT DETECTED Final   Streptococcus agalactiae NOT DETECTED NOT DETECTED Final   Streptococcus pneumoniae NOT DETECTED NOT DETECTED Final   Streptococcus pyogenes NOT DETECTED NOT DETECTED Final   A.calcoaceticus-baumannii NOT DETECTED NOT DETECTED Final   Bacteroides fragilis NOT DETECTED NOT DETECTED Final   Enterobacterales NOT DETECTED NOT DETECTED Final   Enterobacter cloacae complex NOT DETECTED NOT  DETECTED Final   Escherichia coli NOT DETECTED NOT DETECTED Final   Klebsiella aerogenes NOT DETECTED NOT DETECTED Final   Klebsiella oxytoca NOT DETECTED NOT DETECTED Final   Klebsiella pneumoniae NOT DETECTED NOT DETECTED Final   Proteus species NOT DETECTED NOT DETECTED Final   Salmonella species NOT DETECTED NOT DETECTED Final   Serratia marcescens NOT DETECTED NOT DETECTED Final   Haemophilus influenzae NOT DETECTED NOT DETECTED Final   Neisseria meningitidis NOT DETECTED NOT DETECTED Final   Pseudomonas aeruginosa NOT DETECTED NOT DETECTED Final   Stenotrophomonas maltophilia NOT DETECTED NOT DETECTED Final   Candida albicans NOT DETECTED NOT DETECTED Final   Candida auris NOT DETECTED NOT DETECTED Final   Candida glabrata NOT DETECTED NOT DETECTED Final   Candida krusei NOT DETECTED NOT DETECTED Final   Candida parapsilosis NOT DETECTED NOT DETECTED Final   Candida tropicalis NOT DETECTED NOT DETECTED Final   Cryptococcus neoformans/gattii NOT DETECTED NOT DETECTED Final    Comment: Performed at [facility], [address], [place], [place] [address]  Blood culture (routine x 2)     Status: None (Preliminary result)   Collection Time: 04/23/24 11:46 PM   Specimen: BLOOD  Result Value Ref Range Status   Specimen Description BLOOD SITE NOT SPECIFIED  Final   Special Requests   Final    BOTTLES DRAWN AEROBIC AND ANAEROBIC Blood Culture results may not be optimal due to an inadequate volume of blood received in culture bottles   Culture   Final    NO GROWTH 3 DAYS Performed at [facility], [address], [place], [place] [address]    Report Status PENDING  Incomplete  Resp panel by RT-PCR (RSV, Flu A&B, Covid) Anterior Nasal Swab     Status: None   Collection Time: 04/24/24 12:33 AM   Specimen: Anterior Nasal Swab  Result Value Ref Range Status   SARS Coronavirus 2 by RT PCR NEGATIVE NEGATIVE Final   Influenza A by PCR NEGATIVE NEGATIVE Final   Influenza B by  PCR NEGATIVE NEGATIVE Final    Comment: (NOTE) The Xpert Xpress SARS-CoV-2/FLU/RSV plus assay is intended as an aid in the diagnosis of influenza from Nasopharyngeal swab specimens and should not be used as a sole basis for treatment. Nasal washings and aspirates are unacceptable for Xpert Xpress SARS-CoV-2/FLU/RSV testing.  Fact Sheet for Patients: BloggerCourse.com  Fact Sheet for Healthcare Providers: SeriousBroker.it  This test is not yet approved or cleared by the [place]  FDA and  has been authorized for detection and/or diagnosis of SARS-CoV-2 by FDA under an Emergency Use Authorization (EUA). This EUA will remain in effect (meaning this test can be used) for the duration of the COVID-19 declaration under Section 564(b)(1) of the Act, 21 U.S.C. section 360bbb-3(b)(1), unless the authorization is terminated or revoked.     Resp Syncytial Virus by PCR NEGATIVE NEGATIVE Final    Comment: (NOTE) Fact Sheet for Patients: BloggerCourse.com  Fact Sheet for Healthcare Providers: SeriousBroker.it  This test is not yet approved or cleared by the [place]  FDA and has been authorized for detection and/or diagnosis of SARS-CoV-2 by FDA under an Emergency Use Authorization (EUA). This EUA will remain in effect (meaning this test can be used) for the duration of the COVID-19 declaration under Section 564(b)(1) of the Act, 21 U.S.C. section 360bbb-3(b)(1), unless the authorization is terminated or revoked.  Performed at [facility], [address] [place], [place] [address]   MRSA Next Gen by PCR, Nasal     Status: None   Collection Time: 04/25/24  6:25 AM   Specimen: Nasal Mucosa; Nasal Swab  Result Value Ref Range Status   MRSA by PCR Next Gen NOT DETECTED NOT DETECTED Final    Comment: (NOTE) The GeneXpert MRSA Assay (FDA approved for NASAL specimens only), is  one component of a comprehensive MRSA colonization surveillance program. It is not intended to diagnose MRSA infection nor to guide or monitor treatment for MRSA infections. Test performance is not FDA approved in patients less than 55 years old. Performed at [facility], [address] [place], [place] [address]     Radiology Report DG Chest Advance 1 View Result Date: 04/26/2024 CLINICAL DATA:  83 year old male with shortness of breath. EXAM: PORTABLE CHEST 1 VIEW COMPARISON:  Portable chest yesterday and earlier. FINDINGS: Portable AP upright view at at 0704 hours. Cardiomegaly. Left chest cardiac pacemaker. Mildly lower lung volumes. Increased retrocardiac opacity on the left. Possible small left pleural effusion. Stable vascularity, no overt edema. No confluent right lung opacity. Visualized tracheal air column is within normal limits. No acute osseous abnormality identified. IMPRESSION: Cardiomegaly with lower lung volumes and increased left lung base hypo ventilation since yesterday. Possible small left pleural effusion. No overt edema. Electronically Signed   By: VEAR Hurst M.D.   On: 04/26/2024 07:31   DG Swallowing Func-Speech Pathology Result Date: 04/25/2024 Table formatting from the original result was not included. Modified Barium Swallow Study Patient Details Name: Mike Gray MRN: [id number] Date of Birth: 03-07-1941 Today's Date: 04/25/2024 HPI/PMH: HPI: Mike Gray is a 83 y.o. male with history of chronic systolic heart failure, paroxysmal atrial fibrillation, COPD, complete heart block status post pacemaker placement, ICD placement, BPH, bipolar disorder was brought to the ER after patient's brother found that patient is getting more short of breath, weaker, and tremulous. Per chart has been eating poorly.  Initial concern for wide-complex tachycardia cardiology evaluated and felt that patient's rhythm was normal sinus and to treat for sepsis and started on sepsis protocol fluids.  Found to have severe sepsis secondary to pna.CXR Retrocardiac left base atelectasis or infiltrate. MBS in 2022-single instance aspiration with initial sip with cough, invasion eliminated with chin tuck. Reg/thin, chin tuck recommended. Clinical Impression: Clinical Impression: Pt has mild oral deficits and hand tremor during self-feeding that result in a little oral discoordination while accepting boluses, but for the most part he regains good oral control and has a functional swallow. This does  contribute to occasional penetration with thin liquids that is trace and transient (PAS 2, considered to be normal in general and also noted in previous MBS to be pt's established baseline). There was a single episode of aspiration when drinking via straw, which happened as a result of oral residue that spilled posteriorly to the pharynx and into the trachea in between consecutive sips. This aspiration was silent (PAS 8), but pt denies ever using straws. Recommend that he continue with regular solids and thin liquids via cup. Factors that may increase risk of adverse event in presence of aspiration Noe & Lianne 2021): Factors that may increase risk of adverse event in presence of aspiration Noe & Lianne 2021): Respiratory or GI disease Recommendations/Plan: Swallowing Evaluation Recommendations Swallowing Evaluation Recommendations Recommendations: PO diet PO Diet Recommendation: Regular; Thin liquids (Level 0) Liquid Administration via: Cup; No straw Medication Administration: Whole meds with liquid Supervision: Staff to assist with self-feeding Swallowing strategies  : Slow rate; Small bites/sips Postural changes: Position pt fully upright for meals Oral care recommendations: Oral care BID (2x/day) Treatment Plan Treatment Plan Treatment recommendations: No treatment recommended at this time Follow-up recommendations: No SLP follow up Functional status assessment: Patient has not had a recent decline in their  functional status. Recommendations Recommendations for follow up therapy are one component of a multi-disciplinary discharge planning process, led by the attending physician.  Recommendations may be updated based on patient status, additional functional criteria and insurance authorization. Assessment: Orofacial Exam: Orofacial Exam Oral Cavity - Dentition: Dentures, top; Dentures, bottom Anatomy: Anatomy: WFL Boluses Administered: Boluses Administered Boluses Administered: Thin liquids (Level 0); Mildly thick liquids (Level 2, nectar thick); Puree; Solid  Oral Impairment Domain: Oral Impairment Domain Lip Closure: No labial escape Tongue control during bolus hold: Cohesive bolus between tongue to palatal seal Bolus preparation/mastication: Timely and efficient chewing and mashing Bolus transport/lingual motion: Brisk tongue motion Oral residue: Trace residue lining oral structures Location of oral residue : Tongue; Floor of mouth; Lateral sulci Initiation of pharyngeal swallow : Valleculae  Pharyngeal Impairment Domain: Pharyngeal Impairment Domain Soft palate elevation: No bolus between soft palate (SP)/pharyngeal wall (PW) Laryngeal elevation: Complete superior movement of thyroid  cartilage with complete approximation of arytenoids to epiglottic petiole Anterior hyoid excursion: Partial anterior movement Epiglottic movement: Complete inversion Laryngeal vestibule closure: Complete, no air/contrast in laryngeal vestibule Pharyngeal stripping wave : Present - complete Pharyngeal contraction (A/P view only): N/A Pharyngoesophageal segment opening: Complete distension and complete duration, no obstruction of flow Tongue base retraction: Trace column of contrast or air between tongue base and PPW Pharyngeal residue: Complete pharyngeal clearance Location of pharyngeal residue: N/A  Esophageal Impairment Domain: Esophageal Impairment Domain Esophageal clearance upright position: Complete clearance, esophageal coating  Pill: Pill Consistency administered: Thin liquids (Level 0) Thin liquids (Level 0): [facility] Penetration/Aspiration Scale Score: Penetration/Aspiration Scale Score 1.  Material does not enter airway: Mildly thick liquids (Level 2, nectar thick); Puree; Solid; Pill 8.  Material enters airway, passes BELOW cords without attempt by patient to eject out (silent aspiration) : Thin liquids (Level 0) (x1 via straw from oral residue that spilled posteriorly) Compensatory Strategies: No data recorded  General Information: Caregiver present: No  Diet Prior to this Study: Regular; Thin liquids (Level 0)   Temperature : Normal   Respiratory Status: WFL   Supplemental O2: Nasal cannula   History of Recent Intubation: No  Behavior/Cognition: Alert; Pleasant mood; Cooperative Self-Feeding Abilities: Needs assist with self-feeding (tremulous) Baseline vocal quality/speech: Normal Volitional Cough: Able to elicit  Volitional Swallow: Able to elicit Exam Limitations: No limitations Goal Planning: Prognosis for improved oropharyngeal function: Good No data recorded No data recorded Patient/Family Stated Goal: none stated Consulted and agree with results and recommendations: Patient Pain: Pain Assessment Pain Assessment: No/denies pain Breathing: 0 Negative Vocalization: 0 Facial Expression: 0 Body Language: 0 Consolability: 0 PAINAD Score: 0 End of Session: Start Time:SLP Start Time (ACUTE ONLY): 1141 Stop Time: SLP Stop Time (ACUTE ONLY): 1156 Time Calculation:SLP Time Calculation (min) (ACUTE ONLY): 15 min Charges: SLP Evaluations $ SLP Speech Visit: 1 Visit SLP Evaluations $BSS Swallow: 1 Procedure $MBS Swallow: 1 Procedure SLP visit diagnosis: SLP Visit Diagnosis: Dysphagia, unspecified (R13.10) Past Medical History: Past Medical History: Diagnosis Date  Angiodysplasia   Atrial fibrillation (HCC)   CHF (congestive heart failure) (HCC)   COPD (chronic obstructive pulmonary disease) (HCC)   GI bleed   Hypertension   Presence of permanent  cardiac pacemaker  Past Surgical History: Past Surgical History: Procedure Laterality Date  COLONOSCOPY    ICD GENERATOR CHANGEOUT N/A 07/20/2022  Procedure: ICD GENERATOR CHANGEOUT;  Surgeon: Inocencio Soyla Lunger, MD;  Location: [facility] INVASIVE CV LAB;  Service: Cardiovascular;  Laterality: N/A;  INGUINAL HERNIA REPAIR Right 12/14/2022  Procedure: OPEN RIGHT HERNIA REPAIR INGUINAL ADULT WITH MESH;  Surgeon: Rubin Calamity, MD;  Location: [facility] OR;  Service: General;  Laterality: Right;  PACEMAKER IMPLANT   Leita SAILOR., M.A. CCC-SLP Acute Rehabilitation Services Office: [phone] Secure chat preferred 04/25/2024, 1:49 PM    Signature  -   Lavada Stank M.D on 04/27/2024 at 8:04 AM   -  To page go to [email or web address]

## 2024-04-27 NOTE — Progress Notes (Signed)
 Patient is progressing in the plain of care regarding respiratory function. Patient is confused. Patient was fidgeting with his purwick and yelling out. When this nurse came to patient room, patient was not in need. Patient spoke in an incoherent manner. Patient may benefit from a medication order for his restlessness and yelling out. No adverse change in condition. Will continue to monitor.

## 2024-04-28 DIAGNOSIS — A419 Sepsis, unspecified organism: Secondary | ICD-10-CM | POA: Diagnosis not present

## 2024-04-28 NOTE — Care Management Important Message (Signed)
 Important Message  Patient Details  Name: Mike Gray MRN: [id number] Date of Birth: 10-Oct-1941   Important Message Given:  Yes - Medicare IM     Claretta Deed 04/28/2024, 4:08 PM

## 2024-04-28 NOTE — Progress Notes (Signed)
   04/28/24 0955  Mobility  Activity Transferred from bed to chair  Level of Assistance Moderate assist, patient does 50-74% (+2)  Assistive Device Other (Comment) (HHA)  Distance Ambulated (ft) 3 ft  Activity Response Tolerated fair  Mobility Referral Yes  Mobility visit 1 Mobility  Mobility Specialist Start Time (ACUTE ONLY) 0955  Mobility Specialist Stop Time (ACUTE ONLY) 1010  Mobility Specialist Time Calculation (min) (ACUTE ONLY) 15 min   Mobility Specialist: Progress Note- Visits:2  Pt agreeable to mobility session - assisted NT with bed bath - received in bed. Pt with consistent cough. Returned to bed with all needs met - call bell within reach. Bed alarm on. Brother and NT present.   ______________________________________________________________________________  Pre Mobility: HR 60, SPO2 97 % 3L  Post Mobility: HR 67, SPO2 95 % 3L  Pt agreeable to mobility session - received in bed. Pt with consistent cough. Pt with knee buckling throughout transfer. Returned to chair with all needs met - call bell within reach. Chair alarm on.   Virgle Boards, BS Mobility Specialist Please contact via SecureChat or  Rehab office at [phone].

## 2024-04-28 NOTE — Progress Notes (Signed)
 Patient is progressing in the plan of care regarding respiratory function. Patient is confused. PO medications tolerated well. No adverse change in condition. Will continue to monitor.

## 2024-04-28 NOTE — Care Management Important Message (Signed)
 Important Message  Patient Details  Name: Mike Gray MRN: [id number] Date of Birth: 28-Jan-1941   Important Message Given:  Yes - Medicare IM  Patient left prior to IM delivery will mail to the patient home address.    Tamecka Milham 04/28/2024, 4:23 PM

## 2024-04-28 NOTE — Progress Notes (Signed)
 PROGRESS NOTE                                                                                                                                                                                                             Patient Demographics:    Mike Gray, is a 83 y.o. male, DOB - June 03, 1941, FMW:[id number]  Outpatient Primary MD for the patient is Regino, [name], MD    LOS - 4  Admit date - 04/23/2024    Chief Complaint  Patient presents with   Shortness of Breath       Brief Narrative (HPI from H&P)   83 y.o. male with history of chronic systolic heart failure, paroxysmal atrial fibrillation, COPD, complete heart block status post pacemaker placement, ICD placement, BPH, bipolar disorder was brought to the ER after patient's brother found that patient is getting more short of breath, weaker, and tremulous.   Patient has been getting progressively worsening shortness of breath with nonproductive cough over the last 2 weeks.  Has not had any chest pain.  Has been eating poorly.  In the last 2 days patient became more tremulous and weak and last night became more short of breath was brought to the ER.  Was diagnosed with pneumonia and admitted to the hospital   Subjective:   Patient in bed, appears comfortable, denies any headache, no fever, no chest pain or pressure, no shortness of breath , no abdominal pain. No new focal weakness.    Assessment  & Plan :    Severe sepsis secondary to pneumonia  - Fever, tachycardia, tachypnea, lactic acidosis.  Suspected lung source with pneumonia. Continue Rocephin  and doxycycline ,  Currently no wheezing, low-dose steroids started earlier will be tapered over the next few days, 1 out of 2 blood cultures likely contaminant.  If stable likely discharge in the next 1 to 2 days.   Acute hypoxic respiratory failure - Does not appear to be on oxygen at baseline, treat underlying pneumonia and  chronic systolic CHF.  As needed nebulizer treatments and supplemental oxygen.   Acute metabolic encephalopathy  - likely from sepsis, negative head CT no headache or focal deficits.  Much improved  Demand ischemia -troponin trend flat and not in ACS pattern, no chest pain, likely demand mismatch from hypoxia caused by pneumonia, EF is depressed at around 30%, has  AICD, global hypokinesis on echo, this is largely unchanged from previous echo in 2023.  Continue supportive care with outpatient follow-up with his primary cardiologist postdischarge, continue beta-blocker, Eliquis , statin for secondary prevention.   COPD exacerbation - Continue nebs, tapered of steroids asno wheezing.   History of chronic HFrEF  - last EF measured was 30 to 35% on August 2023.  Status post ICD placement.  On amiodarone .  Continue Lasix  as needed and monitor, beta-blocker as tolerated along with as needed Lasix , due to underlying renal insufficiency not a candidate for ACE/ARB or Entresto.   Paroxysmal atrial fibrillation - history of complete heart block status post pacemaker placement on Eliquis . Continue combination of beta-blocker, amiodarone  and Eliquis .   History of bipolar disorder  - on Depakote    BPH  - on Flomax  and finasteride .   Chronic kidney disease stage III, baseline creatinine around 1.5 - Currently renal function is close to baseline monitor with diuretics, currently stable      Condition - Extremely Guarded  Family Communication  : Brother [phone]  called on 04/25/2024 at 10:37 AM and message left  Code Status :  DNR  Consults  :  None  PUD Prophylaxis :     Procedures  :     TTE  1. Left ventricular ejection fraction, by estimation, is 30 to 35%. The left ventricle has moderately decreased function. The left ventricle demonstrates global hypokinesis. The left ventricular internal cavity size was severely dilated. There is moderate concentric left ventricular hypertrophy. Left  ventricular diastolic function could not be evaluated.  2. Right ventricular systolic function is normal. The right ventricular size is normal. There is normal pulmonary artery systolic pressure.  3. Left atrial size was severely dilated.  4. Right atrial size was moderately dilated.  5. The mitral valve is degenerative. Trivial mitral valve regurgitation. No evidence of mitral stenosis.  6. The aortic valve is grossly normal. There is moderate calcification of the aortic valve. There is mild thickening of the aortic valve. Aortic valve regurgitation is not visualized. Aortic valve sclerosis/calcification is present, without any evidence  of aortic stenosis.  7. The inferior vena cava is dilated in size with >50% respiratory variability, suggesting right atrial pressure of 8 mmHg. Comparison(s): No significant change from prior study  CT - 1. Left lower lobe pneumonia and/or aspiration. 2. No acute abnormality in the abdomen or pelvis. 3. Cholelithiasis. 4. Punctate nonobstructing stones in both kidneys. 5. Prostatomegaly. Aortic Atherosclerosis.       Disposition Plan  :    Status is: Inpatient    DVT Prophylaxis  :    apixaban  (ELIQUIS ) tablet 2.5 mg Start: 04/25/24 2200 apixaban  (ELIQUIS ) tablet 2.5 mg     Lab Results  Component Value Date   PLT 155 04/26/2024    Diet :  Diet Order             Diet Heart Room service appropriate? Yes; Fluid consistency: Thin  Diet effective now                    Inpatient Medications  Scheduled Meds:  amiodarone   100 mg Oral Daily   apixaban   2.5 mg Oral BID   atorvastatin   20 mg Oral QHS   budesonide  (PULMICORT ) nebulizer solution  0.25 mg Nebulization BID   divalproex   1,000 mg Oral Daily   doxycycline   100 mg Oral BID   finasteride   5 mg Oral QHS   ipratropium-albuterol   3 mL Nebulization  TID   metoprolol  tartrate  50 mg Oral BID   tamsulosin   0.8 mg Oral QPC supper   Continuous Infusions:  cefTRIAXone  (ROCEPHIN )  IV 2 g  (04/27/24 1156)   PRN Meds:.acetaminophen  **OR** acetaminophen , ipratropium-albuterol    Objective:   Vitals:   04/27/24 2200 04/28/24 0000 04/28/24 0500 04/28/24 0754  BP: (!) 142/82  139/74 (!) 147/75  Pulse: 60     Resp:   20   Temp:  97.8 F (36.6 C) (!) 97.5 F (36.4 C) 98.2 F (36.8 C)  TempSrc:  Oral Oral Oral  SpO2:      Weight:      Height:        Wt Readings from Last 3 Encounters:  04/23/24 78 kg  03/11/24 78 kg  01/31/24 78 kg     Intake/Output Summary (Last 24 hours) at 04/28/2024 0949 Last data filed at 04/28/2024 0756 Gross per 24 hour  Intake --  Output 700 ml  Net -700 ml     Physical Exam  Awake Alert, No new F.N deficits, Normal affect [place].AT,PERRAL Supple Neck, No JVD,   Symmetrical Chest wall movement, Good air movement bilaterally, coarse bibasilar breath sounds with some crackles and wheezes RRR,No Gallops,Rubs or new Murmurs,  +ve B.Sounds, Abd Soft, No tenderness,   No Cyanosis, Clubbing or edema      Data Review:    Recent Labs  Lab 04/23/24 2355 04/24/24 0448 04/25/24 0514 04/26/24 0432  WBC 5.9 9.6 11.6* 10.0  HGB 15.4 12.8* 12.7* 13.0  HCT 48.0 39.8 39.2 39.2  PLT 123* 129* 138* 155  MCV 91.1 91.7 89.5 88.5  MCH 29.2 29.5 29.0 29.3  MCHC 32.1 32.2 32.4 33.2  RDW 15.1 15.0 15.1 14.8  LYMPHSABS 1.2 0.4* 0.7 0.6*  MONOABS 0.5 0.4 0.8 0.5  EOSABS 0.0 0.0 0.0 0.0  BASOSABS 0.0 0.0 0.0 0.0    Recent Labs  Lab 04/23/24 2355 04/24/24 0005 04/24/24 0130 04/24/24 0448 04/24/24 1635 04/25/24 0514 04/26/24 0432  NA 137  --   --  134*  --  138 137  K 4.2  --   --  4.2  --  4.4 4.2  CL 101  --   --  104  --  106 102  CO2 24  --   --  21*  --  22 26  ANIONGAP 12  --   --  9  --  10 9  GLUCOSE 125*  --   --  171*  --  91 102*  BUN 32*  --   --  31*  --  36* 38*  CREATININE 1.68*  --   --  1.77*  --  1.49* 1.47*  AST 39  --   --  31  --   --   --   ALT 22  --   --  16  --   --   --   ALKPHOS 52  --   --  38  --   --   --    BILITOT 1.1  --   --  0.8  --   --   --   ALBUMIN 3.8  --   --  2.8*  --   --   --   CRP  --   --   --   --   --  7.0* 3.9*  PROCALCITON  --   --   --   --   --  0.15 <0.10  LATICACIDVEN  --  2.2* 3.1*  --   --   --   --   TSH  --   --   --  1.586  --   --   --   AMMONIA  --   --   --   --  13  --   --   BNP 798.5*  --   --   --   --  910.0* 2,044.3*  MG  --   --   --  1.6*  --  1.8 2.3  PHOS  --   --   --   --   --   --  3.4  CALCIUM  9.9  --   --  9.0  --  9.5 9.5      Recent Labs  Lab 04/23/24 2355 04/24/24 0005 04/24/24 0130 04/24/24 0448 04/24/24 1635 04/25/24 0514 04/26/24 0432  CRP  --   --   --   --   --  7.0* 3.9*  PROCALCITON  --   --   --   --   --  0.15 <0.10  LATICACIDVEN  --  2.2* 3.1*  --   --   --   --   TSH  --   --   --  1.586  --   --   --   AMMONIA  --   --   --   --  13  --   --   BNP 798.5*  --   --   --   --  910.0* 2,044.3*  MG  --   --   --  1.6*  --  1.8 2.3  CALCIUM  9.9  --   --  9.0  --  9.5 9.5    --------------------------------------------------------------------------------------------------------------- Lab Results  Component Value Date   CHOL 87 07/29/2021   HDL 34 (L) 07/29/2021   LDLCALC 40 07/29/2021   TRIG 63 07/29/2021   CHOLHDL 2.6 07/29/2021    Lab Results  Component Value Date   HGBA1C 6.2 (H) 07/31/2021   No results for input(s): TSH, T4TOTAL, FREET4, T3FREE, THYROIDAB in the last 72 hours.  No results for input(s): VITAMINB12, FOLATE, FERRITIN, TIBC, IRON, RETICCTPCT in the last 72 hours. ------------------------------------------------------------------------------------------------------------------ Cardiac Enzymes No results for input(s): CKMB, TROPONINI, MYOGLOBIN in the last 168 hours.  Invalid input(s): CK  Micro Results Recent Results (from the past 240 hours)  Blood culture (routine x 2)     Status: Abnormal   Collection Time: 04/23/24 11:41 PM   Specimen: BLOOD  Result  Value Ref Range Status   Specimen Description BLOOD SITE NOT SPECIFIED  Final   Special Requests   Final    BOTTLES DRAWN AEROBIC AND ANAEROBIC Blood Culture results may not be optimal due to an inadequate volume of blood received in culture bottles   Culture  Setup Time   Final    GRAM POSITIVE COCCI ANAEROBIC BOTTLE ONLY CRITICAL RESULT CALLED TO, READ BACK BY AND VERIFIED WITH: PHARMD J WYLAND [id number] AT 945 AM BY CM    Culture (A)  Final    STAPHYLOCOCCUS CAPITIS THE SIGNIFICANCE OF ISOLATING THIS ORGANISM FROM A SINGLE SET OF BLOOD CULTURES WHEN MULTIPLE SETS ARE DRAWN IS UNCERTAIN. PLEASE NOTIFY THE MICROBIOLOGY DEPARTMENT WITHIN ONE WEEK IF SPECIATION AND SENSITIVITIES ARE REQUIRED. Performed at [facility], [address], [place], [place] [address]    Report Status 04/27/2024 FINAL  Final  Blood Culture ID Panel (Reflexed)     Status: Abnormal  Collection Time: 04/23/24 11:41 PM  Result Value Ref Range Status   Enterococcus faecalis NOT DETECTED NOT DETECTED Final   Enterococcus Faecium NOT DETECTED NOT DETECTED Final   Listeria monocytogenes NOT DETECTED NOT DETECTED Final   Staphylococcus species DETECTED (A) NOT DETECTED Final    Comment: CRITICAL RESULT CALLED TO, READ BACK BY AND VERIFIED WITH: PHARMD J WYLAND [id number] AT 645 AM BY CM    Staphylococcus aureus (BCID) NOT DETECTED NOT DETECTED Final   Staphylococcus epidermidis NOT DETECTED NOT DETECTED Final   Staphylococcus lugdunensis NOT DETECTED NOT DETECTED Final   Streptococcus species NOT DETECTED NOT DETECTED Final   Streptococcus agalactiae NOT DETECTED NOT DETECTED Final   Streptococcus pneumoniae NOT DETECTED NOT DETECTED Final   Streptococcus pyogenes NOT DETECTED NOT DETECTED Final   A.calcoaceticus-baumannii NOT DETECTED NOT DETECTED Final   Bacteroides fragilis NOT DETECTED NOT DETECTED Final   Enterobacterales NOT DETECTED NOT DETECTED Final   Enterobacter cloacae complex NOT DETECTED NOT DETECTED  Final   Escherichia coli NOT DETECTED NOT DETECTED Final   Klebsiella aerogenes NOT DETECTED NOT DETECTED Final   Klebsiella oxytoca NOT DETECTED NOT DETECTED Final   Klebsiella pneumoniae NOT DETECTED NOT DETECTED Final   Proteus species NOT DETECTED NOT DETECTED Final   Salmonella species NOT DETECTED NOT DETECTED Final   Serratia marcescens NOT DETECTED NOT DETECTED Final   Haemophilus influenzae NOT DETECTED NOT DETECTED Final   Neisseria meningitidis NOT DETECTED NOT DETECTED Final   Pseudomonas aeruginosa NOT DETECTED NOT DETECTED Final   Stenotrophomonas maltophilia NOT DETECTED NOT DETECTED Final   Candida albicans NOT DETECTED NOT DETECTED Final   Candida auris NOT DETECTED NOT DETECTED Final   Candida glabrata NOT DETECTED NOT DETECTED Final   Candida krusei NOT DETECTED NOT DETECTED Final   Candida parapsilosis NOT DETECTED NOT DETECTED Final   Candida tropicalis NOT DETECTED NOT DETECTED Final   Cryptococcus neoformans/gattii NOT DETECTED NOT DETECTED Final    Comment: Performed at [facility], [address], [place], [place] [address]  Blood culture (routine x 2)     Status: None (Preliminary result)   Collection Time: 04/23/24 11:46 PM   Specimen: BLOOD  Result Value Ref Range Status   Specimen Description BLOOD SITE NOT SPECIFIED  Final   Special Requests   Final    BOTTLES DRAWN AEROBIC AND ANAEROBIC Blood Culture results may not be optimal due to an inadequate volume of blood received in culture bottles   Culture   Final    NO GROWTH 4 DAYS Performed at [facility], [address], [place], [place] [address]    Report Status PENDING  Incomplete  Resp panel by RT-PCR (RSV, Flu A&B, Covid) Anterior Nasal Swab     Status: None   Collection Time: 04/24/24 12:33 AM   Specimen: Anterior Nasal Swab  Result Value Ref Range Status   SARS Coronavirus 2 by RT PCR NEGATIVE NEGATIVE Final   Influenza A by PCR NEGATIVE NEGATIVE Final   Influenza B by PCR  NEGATIVE NEGATIVE Final    Comment: (NOTE) The Xpert Xpress SARS-CoV-2/FLU/RSV plus assay is intended as an aid in the diagnosis of influenza from Nasopharyngeal swab specimens and should not be used as a sole basis for treatment. Nasal washings and aspirates are unacceptable for Xpert Xpress SARS-CoV-2/FLU/RSV testing.  Fact Sheet for Patients: BloggerCourse.com  Fact Sheet for Healthcare Providers: SeriousBroker.it  This test is not yet approved or cleared by the [place]  FDA and  has been authorized for detection and/or diagnosis of SARS-CoV-2 by FDA under an Emergency Use Authorization (EUA). This EUA will remain in effect (meaning this test can be used) for the duration of the COVID-19 declaration under Section 564(b)(1) of the Act, 21 U.S.C. section 360bbb-3(b)(1), unless the authorization is terminated or revoked.     Resp Syncytial Virus by PCR NEGATIVE NEGATIVE Final    Comment: (NOTE) Fact Sheet for Patients: BloggerCourse.com  Fact Sheet for Healthcare Providers: SeriousBroker.it  This test is not yet approved or cleared by the [place]  FDA and has been authorized for detection and/or diagnosis of SARS-CoV-2 by FDA under an Emergency Use Authorization (EUA). This EUA will remain in effect (meaning this test can be used) for the duration of the COVID-19 declaration under Section 564(b)(1) of the Act, 21 U.S.C. section 360bbb-3(b)(1), unless the authorization is terminated or revoked.  Performed at [facility], [address] [place], [place] [address]   MRSA Next Gen by PCR, Nasal     Status: None   Collection Time: 04/25/24  6:25 AM   Specimen: Nasal Mucosa; Nasal Swab  Result Value Ref Range Status   MRSA by PCR Next Gen NOT DETECTED NOT DETECTED Final    Comment: (NOTE) The GeneXpert MRSA Assay (FDA approved for NASAL specimens only), is one  component of a comprehensive MRSA colonization surveillance program. It is not intended to diagnose MRSA infection nor to guide or monitor treatment for MRSA infections. Test performance is not FDA approved in patients less than 43 years old. Performed at [facility], [address], [place], [place] [address]     Radiology Report No results found.    Signature  -   Lavada Stank M.D on 04/28/2024 at 9:49 AM   -  To page go to [email or web address]

## 2024-04-28 NOTE — TOC Progression Note (Addendum)
 Transition of Care [facility]) - Progression Note    Patient Details  Name: Mike Gray MRN: [id number] Date of Birth: [birth year]  Transition of Care [facility]) CM/SW Contact  Inocente GORMAN Kindle, LCSW Phone Number: 04/28/2024, 10:24 AM  Clinical Narrative:    10:15am-CSW met with patient's brother, Lamar, and provided SNF bed offers and Medicare ratings. He stated he will tour places today and let CSW know choice though he has heard of [facility].   2:24 PM-CSW received call from patient's brother stating he was able to tour facilities and he would like to choose [facility] as his first choice. CSW updated [facility] who will have a private room for patient tomorrow. Brother is requesting [facility] for transport.     Skilled Nursing Rehab Facilities-   [email or web address]   Ratings out of 5 stars (5 the highest)  Name Address  Phone # Quality Care Staffing Health Inspection Overall  [facility] [address] [place] [phone] 3 1 4 3   [facility] [address], [place] [phone] 5 2 4 5   [facility] [address], [place] [phone] 2 1 1 1   [facility] [address] [place] [phone] 4 3 4 4   [facility]  [address], [place] [phone] 5 3 5 5   [facility] [address], [facility] [phone] 5 3 2 3   [facility] [address], [place] [phone] 5 1 2 2   [facility] [address] [place] [phone] 2 3 4 4   [address] ([place]) [address] [place] [phone] 1 3 3 2   [facility] [address] [place], [place] [phone] 3 1 2 1   [facility] [address], [place] [phone] 3 1 1 1   [facility] [address] [place] [phone] 3 3 4 4   [facility] [address] [place] [phone] 4 1 2 1   [facility]) [address], [place] [phone] 1 1 4 2            [facility] [address] [place] [phone] 3 1 5 4   [facility] [address] [place] [phone] 4 1 1 1   [facility]  [address] [place] [phone] 2 3 1 1   [facility] [address] [phone] 2 2 4 4   [facility] [address] [place] [address], [place] [phone] 2 2 3 3           [facility] [address] [place] [phone] 2 1 2 1   [facility] (No [facility]) [address], [place] [phone] 5 4 4 5   [facility] [address], [facility] [phone] 3 4 4 4   [facility] [address] [place] [phone] 3 1 2 1   [facility] [address] [place] [phone] 3 2 2 2   [facility] [address] [place] [phone] 1 3 3 2   [facility] [address] [place] [phone] 2 2 3 3   [facility] [address] [place], [place] [phone] 2 1 4 3   [facility] [address], [facility] [phone] 2 1 1 1   [facility] [address] [place], [place] [phone] 2 1 2 1   [facility] [address] [place] [phone] 3 1 2 1           [facility] [address] [place] [phone] 4 1 2 1   [facility] [address] [place]  [phone] 3 4 4 4   [facility] (No [employer]) [address] [place], [place] [phone] 3 2 5 5   [facility]) [address], [place] [phone] 2 2 3 3   [facility] [address], [place] [phone] 5 3 5 5   [facility] [address], [place] [phone] [facility] [address] [place], [place] [phone] 3 5 5 5           [facility] [address] [place], [place] [phone] 5 4 5 5   [facility])  [address], [place] [phone] 1 1 2 1   [facility]) [address], [place] [phone]  2 4 4   [facility] [address], [place] [phone] 3 5 5 5   [address] [place], [place] [phone] 4 2 2 2   [facility]) [address] [place] [phone] 1 1 3 1   [address], [place] [phone] 2 2 2 2       Expected Discharge Plan: Skilled Nursing Facility Barriers to Discharge: Continued Medical Work up, SNF Pending bed offer  Expected Discharge Plan and Services In-house Referral: Clinical Social Work   Post Acute Care Choice: Skilled Nursing Facility Living arrangements for the past 2 months: Single Family Home                                       Social Determinants of Health (SDOH) Interventions SDOH Screenings   Food Insecurity: No Food Insecurity (04/24/2024)  Housing: Low Risk  (04/24/2024)  Transportation Needs: No Transportation Needs (04/24/2024)  Utilities: Not At Risk (04/24/2024)  Depression (PHQ2-9): Low Risk  (07/18/2022)  Social Connections: Socially Isolated (04/24/2024)  Tobacco Use: Medium Risk (04/24/2024)    Readmission Risk Interventions     No data to display

## 2024-04-29 DIAGNOSIS — A419 Sepsis, unspecified organism: Secondary | ICD-10-CM | POA: Diagnosis not present

## 2024-04-29 LAB — CULTURE, BLOOD (ROUTINE X 2): Culture: NO GROWTH

## 2024-04-29 MED ORDER — SALINE SPRAY 0.65 % NA SOLN: 2.0000 | NASAL | Status: DC

## 2024-04-29 MED ORDER — DOXYCYCLINE HYCLATE 100 MG PO TABS: 100.0000 mg | ORAL_TABLET | Freq: Two times a day (BID) | ORAL | Status: DC

## 2024-04-29 MED ORDER — SALINE SPRAY 0.65 % NA SOLN
2.0000 | NASAL | Status: DC
  Filled 2024-04-29: qty 44

## 2024-04-29 NOTE — Progress Notes (Signed)
 Report given to Mad[place], Charity fundraiser at [facility].

## 2024-04-29 NOTE — Discharge Summary (Signed)
 Mike Gray FMW:[id number] DOB: 07-Feb-1941 DOA: 04/23/2024  PCP: Regino Slater, MD  Admit date: 04/23/2024  Discharge date: 04/29/2024  Admitted From: Home   Disposition:  SNF   Recommendations for Outpatient Follow-up:   Follow up with PCP in 1-2 weeks  PCP Please obtain BMP/CBC, 2 view CXR in 1week,  (see Discharge instructions)   PCP Please follow up on the following pending results:    Home Health: None   Equipment/Devices: None  Consultations: None  Discharge Condition: Stable    CODE STATUS: Full    Diet Recommendation: Heart Healthy     Chief Complaint  Patient presents with   Shortness of Breath     Brief history of present illness from the day of admission and additional interim summary    83 y.o. male with history of chronic systolic heart failure, paroxysmal atrial fibrillation, COPD, complete heart block status post pacemaker placement, ICD placement, BPH, bipolar disorder was brought to the ER after patient's brother found that patient is getting more short of breath, weaker, and tremulous.   Patient has been getting progressively worsening shortness of breath with nonproductive cough over the last 2 weeks.  Has not had any chest pain.  Has been eating poorly.  In the last 2 days patient became more tremulous and weak and last night became more short of breath was brought to the ER.  Was diagnosed with pneumonia and admitted to the hospital                                                                 Hospital Course    Severe sepsis secondary to pneumonia  - Fever, tachycardia, tachypnea, lactic acidosis.  Suspected lung source with pneumonia. Continue Rocephin  and doxycycline ,  Currently no wheezing, low-dose steroids started earlier will be tapered over the next few days, 1 out of 2 blood  cultures likely contaminant.  Stable will be discharged on 3 more days of oral doxycycline .  Symptom-free.  Sepsis pathophysiology has completely resolved.  Back to baseline.   Acute hypoxic respiratory failure - Does not appear to be on oxygen at baseline, treat underlying pneumonia and chronic systolic CHF, adequately diuresed and now you will.  As needed nebulizer treatments and  2 L of humidified oxygen, saline sprays to both nostril every 3 hours.  Overall much improved and symptom-free currently on 2 L of oxygen.   Acute metabolic encephalopathy  - likely from sepsis, negative head CT no headache or focal deficits.  Much improved   Demand ischemia -troponin trend flat and not in ACS pattern, no chest pain, likely demand mismatch from hypoxia caused by pneumonia, EF is depressed at around 30%, has AICD, global hypokinesis on echo, this is largely unchanged from previous echo in 2023.  Continue supportive care with outpatient  follow-up with his primary cardiologist postdischarge, continue beta-blocker, Eliquis , statin for secondary prevention.  Discharge follow-up with primary cardiologist in 1 to 2 weeks.   COPD exacerbation - resolved, continue home nebulizer treatments, 2 L of humidified oxygen, saline sprays to both nostril every 3 hours.   History of chronic HFrEF  - last EF measured was 30 to 35% on August 2023.  Status post ICD placement.  On amiodarone .  Continue Lasix  as needed and monitor, beta-blocker as tolerated along with home dose Lasix  resumed upon discharge, due to underlying renal insufficiency not a candidate for ACE/ARB or Entresto.   Paroxysmal atrial fibrillation - history of complete heart block status post pacemaker placement on Eliquis . Continue combination of beta-blocker, amiodarone  and Eliquis .   History of bipolar disorder  - on Depakote , continue   BPH  - on Flomax  and finasteride .   Chronic kidney disease stage III, baseline creatinine around 1.5 - Currently renal  function is close to baseline monitor with diuretics, currently stable, resume home diuretic with caution.  Follow at SNF.    Discharge diagnosis     Principal Problem:   Sepsis (HCC) Active Problems:   Acute metabolic encephalopathy   Lobar pneumonia (HCC)   Elevated troponin   COPD with acute exacerbation (HCC)   Bipolar 1 disorder (HCC)   Unspecified atrial fibrillation (HCC)   Chronic systolic CHF (congestive heart failure) (HCC)   Hypertrophic cardiomyopathy (HCC)   History of complete heart block   ICD (implantable cardioverter-defibrillator) in place   Benign prostatic hyperplasia with lower urinary tract symptoms   CKD (chronic kidney disease) stage 3, GFR 30-59 ml/min (HCC)    Discharge instructions    Discharge Instructions     Diet - low sodium heart healthy   Complete by: As directed    Discharge instructions   Complete by: As directed    Follow with Primary MD [name], [name], MD in 7 days   Get CBC, CMP, 2 view Chest X ray -  checked next visit with your primary MD or SNF MD   Activity: As tolerated with Full fall precautions use walker/cane & assistance as needed  Disposition SNF  Diet: Heart Healthy  with feeding assistance and aspiration precautions.  Special Instructions: If you have smoked or chewed Tobacco  in the last 2 yrs please stop smoking, stop any regular Alcohol  and or any Recreational drug use.  On your next visit with your primary care physician please Get Medicines reviewed and adjusted.  Please request your Prim.MD to go over all Hospital Tests and Procedure/Radiological results at the follow up, please get all Hospital records sent to your Prim MD by signing hospital release before you go home.  If you experience worsening of your admission symptoms, develop shortness of breath, life threatening emergency, suicidal or homicidal thoughts you must seek medical attention immediately by calling 911 or calling your MD immediately  if  symptoms less severe.  You Must read complete instructions/literature along with all the possible adverse reactions/side effects for all the Medicines you take and that have been prescribed to you. Take any new Medicines after you have completely understood and accpet all the possible adverse reactions/side effects.   Do not drive when taking Pain medications.  Do not take more than prescribed Pain, Sleep and Anxiety Medications  Wear Seat belts while driving.   Increase activity slowly   Complete by: As directed        Discharge Medications   Allergies as of  04/29/2024   No Known Allergies      Medication List     TAKE these medications    albuterol  108 (90 Base) MCG/ACT inhaler Commonly known as: VENTOLIN  HFA INHALE 2 PUFFS BY MOUTH EVERY 6 HOURS AS NEEDED   amiodarone  200 MG tablet Commonly known as: PACERONE  TAKE 1/2 (ONE-HALF) TABLET BY MOUTH ONCE DAILY . APPOINTMENT REQUIRED FOR FUTURE REFILLS   apixaban  5 MG Tabs tablet Commonly known as: Eliquis  Take 1 tablet (5 mg total) by mouth 2 (two) times daily.   atorvastatin  20 MG tablet Commonly known as: LIPITOR Take 20 mg by mouth at bedtime.   divalproex  500 MG 24 hr tablet Commonly known as: DEPAKOTE  ER Take 1,000 mg by mouth daily.   doxycycline  100 MG tablet Commonly known as: VIBRA -TABS Take 1 tablet (100 mg total) by mouth 2 (two) times daily.   finasteride  5 MG tablet Commonly known as: PROSCAR  Take 5 mg by mouth at bedtime.   furosemide  20 MG tablet Commonly known as: LASIX  Take 1 tablet (20 mg total) by mouth daily as needed for edema.   metoprolol  succinate 50 MG 24 hr tablet Commonly known as: TOPROL -XL Take 1 tablet (50 mg total) by mouth daily. Take with or immediately following a meal. Take along with 100 mg (Total 150 mg)   sodium chloride  0.65 % Soln nasal spray Commonly known as: OCEAN Place 2 sprays into both nostrils every 3 (three) hours.   tamsulosin  0.4 MG Caps capsule Commonly  known as: FLOMAX  Take 1 capsule (0.4 mg total) by mouth daily after supper. What changed: how much to take   Trelegy Ellipta  100-62.5-25 MCG/ACT Aepb Generic drug: Fluticasone-Umeclidin-Vilant INHALE 1 PUFF ONCE DAILY . APPOINTMENT REQUIRED FOR FUTURE REFILLS         Contact information for follow-up providers     [name], [name], MD. Schedule an appointment as soon as possible for a visit in 1 week(s).   Specialty: Family Medicine Contact information: [address] [place] [address] [phone]              Contact information for after-discharge care     Destination     [facility], [place] .   Service: Skilled Nursing Contact information: [address] [place]  [phone]                     Major procedures and Radiology Reports - PLEASE review detailed and final reports thoroughly  -       DG Chest Port 1 View Result Date: 04/26/2024 CLINICAL DATA:  83 year old male with shortness of breath. EXAM: PORTABLE CHEST 1 VIEW COMPARISON:  Portable chest yesterday and earlier. FINDINGS: Portable AP upright view at at 0704 hours. Cardiomegaly. Left chest cardiac pacemaker. Mildly lower lung volumes. Increased retrocardiac opacity on the left. Possible small left pleural effusion. Stable vascularity, no overt edema. No confluent right lung opacity. Visualized tracheal air column is within normal limits. No acute osseous abnormality identified. IMPRESSION: Cardiomegaly with lower lung volumes and increased left lung base hypo ventilation since yesterday. Possible small left pleural effusion. No overt edema. Electronically Signed   By: VEAR Hurst M.D.   On: 04/26/2024 07:31   DG Swallowing Func-Speech Pathology Result Date: 04/25/2024 Table formatting from the original result was not included. Modified Barium Swallow Study Patient Details Name: Tymeer Vaquera MRN: [id number] Date of Birth: [birth year]  Today's Date: 04/25/2024 HPI/PMH: HPI: Joevanni Roddey is a 83  y.o. male with history of chronic systolic heart failure, paroxysmal atrial fibrillation, COPD, complete heart block status post pacemaker placement, ICD placement, BPH, bipolar disorder was brought to the ER after patient's brother found that patient is getting more short of breath, weaker, and tremulous. Per chart has been eating poorly.  Initial concern for wide-complex tachycardia cardiology evaluated and felt that patient's rhythm was normal sinus and to treat for sepsis and started on sepsis protocol fluids. Found to have severe sepsis secondary to pna.CXR Retrocardiac left base atelectasis or infiltrate. MBS in 2022-single instance aspiration with initial sip with cough, invasion eliminated with chin tuck. Reg/thin, chin tuck recommended. Clinical Impression: Clinical Impression: Pt has mild oral deficits and hand tremor during self-feeding that result in a little oral discoordination while accepting boluses, but for the most part he regains good oral control and has a functional swallow. This does contribute to occasional penetration with thin liquids that is trace and transient (PAS 2, considered to be normal in general and also noted in previous MBS to be pt's established baseline). There was a single episode of aspiration when drinking via straw, which happened as a result of oral residue that spilled posteriorly to the pharynx and into the trachea in between consecutive sips. This aspiration was silent (PAS 8), but pt denies ever using straws. Recommend that he continue with regular solids and thin liquids via cup. Factors that may increase risk of adverse event in presence of aspiration Noe & Lianne 2021): Factors that may increase risk of adverse event in presence of aspiration Noe & Lianne 2021): Respiratory or GI disease Recommendations/Plan: Swallowing Evaluation Recommendations Swallowing Evaluation Recommendations  Recommendations: PO diet PO Diet Recommendation: Regular; Thin liquids (Level 0) Liquid Administration via: Cup; No straw Medication Administration: Whole meds with liquid Supervision: Staff to assist with self-feeding Swallowing strategies  : Slow rate; Small bites/sips Postural changes: Position pt fully upright for meals Oral care recommendations: Oral care BID (2x/day) Treatment Plan Treatment Plan Treatment recommendations: No treatment recommended at this time Follow-up recommendations: No SLP follow up Functional status assessment: Patient has not had a recent decline in their functional status. Recommendations Recommendations for follow up therapy are one component of a multi-disciplinary discharge planning process, led by the attending physician.  Recommendations may be updated based on patient status, additional functional criteria and insurance authorization. Assessment: Orofacial Exam: Orofacial Exam Oral Cavity - Dentition: Dentures, top; Dentures, bottom Anatomy: Anatomy: WFL Boluses Administered: Boluses Administered Boluses Administered: Thin liquids (Level 0); Mildly thick liquids (Level 2, nectar thick); Puree; Solid  Oral Impairment Domain: Oral Impairment Domain Lip Closure: No labial escape Tongue control during bolus hold: Cohesive bolus between tongue to palatal seal Bolus preparation/mastication: Timely and efficient chewing and mashing Bolus transport/lingual motion: Brisk tongue motion Oral residue: Trace residue lining oral structures Location of oral residue : Tongue; Floor of mouth; Lateral sulci Initiation of pharyngeal swallow : Valleculae  Pharyngeal Impairment Domain: Pharyngeal Impairment Domain Soft palate elevation: No bolus between soft palate (SP)/pharyngeal wall (PW) Laryngeal elevation: Complete superior movement of thyroid  cartilage with complete approximation of arytenoids to epiglottic petiole Anterior hyoid excursion: Partial anterior movement Epiglottic movement: Complete  inversion Laryngeal vestibule closure: Complete, no air/contrast in laryngeal vestibule Pharyngeal stripping wave : Present - complete Pharyngeal contraction (A/P view only): N/A Pharyngoesophageal segment opening: Complete distension and complete duration, no obstruction of flow Tongue base retraction: Trace column of contrast or air between tongue base and PPW Pharyngeal residue: Complete pharyngeal clearance Location of  pharyngeal residue: N/A  Esophageal Impairment Domain: Esophageal Impairment Domain Esophageal clearance upright position: Complete clearance, esophageal coating Pill: Pill Consistency administered: Thin liquids (Level 0) Thin liquids (Level 0): [facility] Penetration/Aspiration Scale Score: Penetration/Aspiration Scale Score 1.  Material does not enter airway: Mildly thick liquids (Level 2, nectar thick); Puree; Solid; Pill 8.  Material enters airway, passes BELOW cords without attempt by patient to eject out (silent aspiration) : Thin liquids (Level 0) (x1 via straw from oral residue that spilled posteriorly) Compensatory Strategies: No data recorded  General Information: Caregiver present: No  Diet Prior to this Study: Regular; Thin liquids (Level 0)   Temperature : Normal   Respiratory Status: WFL   Supplemental O2: Nasal cannula   History of Recent Intubation: No  Behavior/Cognition: Alert; Pleasant mood; Cooperative Self-Feeding Abilities: Needs assist with self-feeding (tremulous) Baseline vocal quality/speech: Normal Volitional Cough: Able to elicit Volitional Swallow: Able to elicit Exam Limitations: No limitations Goal Planning: Prognosis for improved oropharyngeal function: Good No data recorded No data recorded Patient/Family Stated Goal: none stated Consulted and agree with results and recommendations: Patient Pain: Pain Assessment Pain Assessment: No/denies pain Breathing: 0 Negative Vocalization: 0 Facial Expression: 0 Body Language: 0 Consolability: 0 PAINAD Score: 0 End of Session: Start  Time:SLP Start Time (ACUTE ONLY): 1141 Stop Time: SLP Stop Time (ACUTE ONLY): 1156 Time Calculation:SLP Time Calculation (min) (ACUTE ONLY): 15 min Charges: SLP Evaluations $ SLP Speech Visit: 1 Visit SLP Evaluations $BSS Swallow: 1 Procedure $MBS Swallow: 1 Procedure SLP visit diagnosis: SLP Visit Diagnosis: Dysphagia, unspecified (R13.10) Past Medical History: Past Medical History: Diagnosis Date  Angiodysplasia   Atrial fibrillation (HCC)   CHF (congestive heart failure) (HCC)   COPD (chronic obstructive pulmonary disease) (HCC)   GI bleed   Hypertension   Presence of permanent cardiac pacemaker  Past Surgical History: Past Surgical History: Procedure Laterality Date  COLONOSCOPY    ICD GENERATOR CHANGEOUT N/A 07/20/2022  Procedure: ICD GENERATOR CHANGEOUT;  Surgeon: Inocencio Soyla Lunger, MD;  Location: [facility] INVASIVE CV LAB;  Service: Cardiovascular;  Laterality: N/A;  INGUINAL HERNIA REPAIR Right 12/14/2022  Procedure: OPEN RIGHT HERNIA REPAIR INGUINAL ADULT WITH MESH;  Surgeon: Rubin Calamity, MD;  Location: [facility] OR;  Service: General;  Laterality: Right;  PACEMAKER IMPLANT   Leita SAILOR., M.A. CCC-SLP Acute Rehabilitation Services Office: [phone] Secure chat preferred 04/25/2024, 1:49 PM  DG Chest Port 1 View Result Date: 04/25/2024 CLINICAL DATA:  Shortness of breath. EXAM: PORTABLE CHEST 1 VIEW COMPARISON:  04/23/2024 FINDINGS: Cardiopericardial silhouette is at upper limits of normal for size. The retrocardiac left base atelectasis or infiltrate. Right lung clear. Left permanent pacemaker/AICD again noted. No acute bony abnormality. Telemetry leads overlie the chest. IMPRESSION: Retrocardiac left base atelectasis or infiltrate. Electronically Signed   By: Camellia Candle M.D.   On: 04/25/2024 07:31   ECHOCARDIOGRAM COMPLETE Result Date: 04/24/2024    ECHOCARDIOGRAM REPORT   Patient Name:   ABRAR BILTON Date of Exam: 04/24/2024 Medical Rec #:  [id number]     Height:       68.0 in Accession #:    [id number]     Weight:       172.0 lb Date of Birth:  09/01/1941    BSA:          1.917 m Patient Age:    82 years      BP:           108/62 mmHg Patient Gender: M  HR:           71 bpm. Exam Location:  Inpatient Procedure: 2D Echo, 3D Echo, Cardiac Doppler and Color Doppler (Both Spectral            and Color Flow Doppler were utilized during procedure). Indications:    Elevated Troponin  History:        Patient has prior history of Echocardiogram examinations, most                 recent 06/09/2022. CHF, Defibrillator; COPD and Chronic Kidney                 Disease.  Sonographer:    Koleen Popper RDCS Referring Phys: [id number] ARSHAD N KAKRAKANDY IMPRESSIONS  1. Left ventricular ejection fraction, by estimation, is 30 to 35%. The left ventricle has moderately decreased function. The left ventricle demonstrates global hypokinesis. The left ventricular internal cavity size was severely dilated. There is moderate concentric left ventricular hypertrophy. Left ventricular diastolic function could not be evaluated.  2. Right ventricular systolic function is normal. The right ventricular size is normal. There is normal pulmonary artery systolic pressure.  3. Left atrial size was severely dilated.  4. Right atrial size was moderately dilated.  5. The mitral valve is degenerative. Trivial mitral valve regurgitation. No evidence of mitral stenosis.  6. The aortic valve is grossly normal. There is moderate calcification of the aortic valve. There is mild thickening of the aortic valve. Aortic valve regurgitation is not visualized. Aortic valve sclerosis/calcification is present, without any evidence  of aortic stenosis.  7. The inferior vena cava is dilated in size with >50% respiratory variability, suggesting right atrial pressure of 8 mmHg. Comparison(s): No significant change from prior study. FINDINGS  Left Ventricle: Left ventricular ejection fraction, by estimation, is 30 to 35%. The left ventricle has moderately decreased  function. The left ventricle demonstrates global hypokinesis. 3D ejection fraction reviewed and evaluated as part of the interpretation. Alternate measurement of EF is felt to be most reflective of LV function. The left ventricular internal cavity size was severely dilated. There is moderate concentric left ventricular hypertrophy. Abnormal (paradoxical) septal motion, consistent with RV pacemaker. Left ventricular diastolic function could not be evaluated due to paced rhythm. Left ventricular diastolic function could not be evaluated. Right Ventricle: The right ventricular size is normal. No increase in right ventricular wall thickness. Right ventricular systolic function is normal. There is normal pulmonary artery systolic pressure. The tricuspid regurgitant velocity is 2.35 m/s, and  with an assumed right atrial pressure of 8 mmHg, the estimated right ventricular systolic pressure is 30.1 mmHg. Left Atrium: Left atrial size was severely dilated. Right Atrium: Right atrial size was moderately dilated. Pericardium: There is no evidence of pericardial effusion. Mitral Valve: The mitral valve is degenerative in appearance. Mild to moderate mitral annular calcification. Trivial mitral valve regurgitation. No evidence of mitral valve stenosis. Tricuspid Valve: The tricuspid valve is normal in structure. Tricuspid valve regurgitation is mild . No evidence of tricuspid stenosis. Aortic Valve: The aortic valve is grossly normal. There is moderate calcification of the aortic valve. There is mild thickening of the aortic valve. Aortic valve regurgitation is not visualized. Aortic valve sclerosis/calcification is present, without any evidence of aortic stenosis. Pulmonic Valve: The pulmonic valve was not well visualized. Pulmonic valve regurgitation is not visualized. No evidence of pulmonic stenosis. Aorta: The aortic root, ascending aorta, aortic arch and descending aorta are all structurally normal, with no evidence of  dilitation or obstruction. Ascending aorta measurements are within normal limits for age when indexed to body surface area. Venous: The inferior vena cava is dilated in size with greater than 50% respiratory variability, suggesting right atrial pressure of 8 mmHg. IAS/Shunts: The atrial septum is grossly normal. Additional Comments: 3D was performed not requiring image post processing on an independent workstation and was abnormal. A device lead is visualized in the right atrium and right ventricle.  LEFT VENTRICLE PLAX 2D LVIDd:         6.60 cm      Diastology LVIDs:         5.00 cm      LV e' medial:    4.74 cm/s LV PW:         1.50 cm      LV E/e' medial:  27.0 LV IVS:        1.40 cm      LV e' lateral:   7.08 cm/s LVOT diam:     1.90 cm      LV E/e' lateral: 18.1 LV SV:         79 LV SV Index:   41 LVOT Area:     2.84 cm                              3D Volume EF: LV Volumes (MOD)            3D EF:        40 % LV vol d, MOD A4C: 245.0 ml LV EDV:       276 ml LV vol s, MOD A4C: 131.0 ml LV ESV:       167 ml LV SV MOD A4C:     245.0 ml LV SV:        109 ml RIGHT VENTRICLE             IVC RV Basal diam:  4.60 cm     IVC diam: 2.40 cm RV Mid diam:    3.00 cm RV S prime:     12.90 cm/s TAPSE (M-mode): 1.8 cm LEFT ATRIUM              Index        RIGHT ATRIUM           Index LA diam:        4.40 cm  2.30 cm/m   RA Area:     26.70 cm LA Vol (A2C):   132.0 ml 68.87 ml/m  RA Volume:   84.60 ml  44.14 ml/m LA Vol (A4C):   100.0 ml 52.17 ml/m LA Biplane Vol: 115.0 ml 60.00 ml/m  AORTIC VALVE LVOT Vmax:   147.00 cm/s LVOT Vmean:  93.200 cm/s LVOT VTI:    0.277 m  AORTA Ao Root diam: 3.10 cm Ao Asc diam:  3.80 cm MITRAL VALVE                TRICUSPID VALVE MV Area (PHT): 3.27 cm     TR Peak grad:   22.1 mmHg MV Decel Time: 232 msec     TR Vmax:        235.00 cm/s MV E velocity: 128.00 cm/s MV A velocity: 47.30 cm/s   SHUNTS MV E/A ratio:  2.71         Systemic VTI:  0.28 m  Systemic Diam:  1.90 cm Shelda Bruckner MD Electronically signed by Shelda Bruckner MD Signature Date/Time: 04/24/2024/3:19:53 PM    Final    CT HEAD WO CONTRAST ( ) Result Date: 04/24/2024 CLINICAL DATA:  83 year old male with altered mental status. Sepsis. EXAM: CT HEAD WITHOUT CONTRAST TECHNIQUE: Contiguous axial images were obtained from the base of the skull through the vertex without intravenous contrast. RADIATION DOSE REDUCTION: This exam was performed according to the departmental dose-optimization program which includes automated exposure control, adjustment of the mA and/or kV according to patient size and/or use of iterative reconstruction technique. COMPARISON:  Head CT 07/19/2021. FINDINGS: Brain: Residual intravascular contrast from CT Chest, Abdomen, and Pelvis today reported separately. Stable cerebral volume since May 15, 2021. No midline shift, ventriculomegaly, mass effect, evidence of mass lesion, intracranial hemorrhage or evidence of cortically based acute infarction. Chronic periventricular white matter hypodensity appears stable. Incidental chronic vascular calcifications in the bilateral basal ganglia and deep cerebellar nuclei. Vascular: Residual intravascular contrast from CTs earlier today. Calcified atherosclerosis at the skull base. Skull: Intact.  No acute osseous abnormality identified. Sinuses/Orbits: Bilateral paranasal sinus mucosal thickening and opacification is new from 05-15-2021, pronounced in the ethmoid sinuses. Tympanic cavities and mastoids remain well aerated. Other: No acute orbit or scalp soft tissue finding. IMPRESSION: 1. No acute intracranial abnormality identified. Residual intravascular contrast from CTs earlier today. 2. Bilateral paranasal sinus inflammation. Electronically Signed   By: VEAR Hurst M.D.   On: 04/24/2024 05:40   CT CHEST ABDOMEN PELVIS W CONTRAST Result Date: 04/24/2024 CLINICAL DATA:  Sepsis EXAM: CT CHEST, ABDOMEN, AND PELVIS WITH CONTRAST TECHNIQUE:  Multidetector CT imaging of the chest, abdomen and pelvis was performed following the standard protocol during bolus administration of intravenous contrast. RADIATION DOSE REDUCTION: This exam was performed according to the departmental dose-optimization program which includes automated exposure control, adjustment of the mA and/or kV according to patient size and/or use of iterative reconstruction technique. CONTRAST:  75mL OMNIPAQUE  IOHEXOL  350 MG/ML SOLN COMPARISON:  CT chest 07/23/2021 radiograph 04/23/2024 CT abdomen pelvis 07/05/2020 FINDINGS: CT CHEST FINDINGS Cardiovascular: Cardiomegaly. No pericardial effusion. Left chest wall ICD. Aortic and coronary artery atherosclerotic calcification. No aortic dissection. Mediastinum/Nodes: Trachea and esophagus are unremarkable. No thoracic adenopathy. Lungs/Pleura: Emphysema. Respiratory motion obscures detail. Infiltrates in the left lower lobe due to pneumonia and/or aspiration. No pleural effusion or pneumothorax. Musculoskeletal: No acute fracture. CT ABDOMEN PELVIS FINDINGS Hepatobiliary: Cholelithiasis. No evidence of acute cholecystitis. No biliary dilation. Unremarkable liver. Pancreas: Unremarkable. Spleen: Unremarkable. Adrenals/Urinary Tract: Normal adrenal glands. Punctate nonobstructing stones in both kidneys. No hydronephrosis. Unremarkable bladder. Stomach/Bowel: Normal caliber large and small bowel. Colonic diverticulosis without diverticulitis. Normal appendix. Stomach is within normal limits. Vascular/Lymphatic: Aortic atherosclerosis. No enlarged abdominal or pelvic lymph nodes. Reproductive: Enlarged prostate. Other: No free intraperitoneal air.  Trace free fluid in the pelvis. Musculoskeletal: No acute fracture. IMPRESSION: 1. Left lower lobe pneumonia and/or aspiration. 2. No acute abnormality in the abdomen or pelvis. 3. Cholelithiasis. 4. Punctate nonobstructing stones in both kidneys. 5. Prostatomegaly. Aortic Atherosclerosis (ICD10-I70.0)  and Emphysema (ICD10-J43.9). Electronically Signed   By: Norman Gatlin M.D.   On: 04/24/2024 02:30   DG Chest 1 View Result Date: 04/24/2024 CLINICAL DATA:  Shortness of breath EXAM: CHEST  1 VIEW COMPARISON:  Radiograph 08/02/2021 FINDINGS: Stable cardiomegaly. Left chest wall ICD. Aortic atherosclerotic calcification. Prominent central pulmonary arteries. The left costophrenic angle is not included in the image. No focal consolidation, pleural effusion, or pneumothorax. IMPRESSION: No acute disease.  Cardiomegaly. Electronically  Signed   By: Norman Gatlin M.D.   On: 04/24/2024 00:25   CUP PACEART REMOTE DEVICE CHECK Result Date: 04/18/2024 ICD Scheduled remote reviewed. Normal device function.  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 LA, CVRS   Micro Results    Recent Results (from the past 240 hours)  Blood culture (routine x 2)     Status: Abnormal   Collection Time: 04/23/24 11:41 PM   Specimen: BLOOD  Result Value Ref Range Status   Specimen Description BLOOD SITE NOT SPECIFIED  Final   Special Requests   Final    BOTTLES DRAWN AEROBIC AND ANAEROBIC Blood Culture results may not be optimal due to an inadequate volume of blood received in culture bottles   Culture  Setup Time   Final    GRAM POSITIVE COCCI ANAEROBIC BOTTLE ONLY CRITICAL RESULT CALLED TO, READ BACK BY AND VERIFIED WITH: PHARMD J WYLAND [id number] AT 945 AM BY CM    Culture (A)  Final    STAPHYLOCOCCUS CAPITIS THE SIGNIFICANCE OF ISOLATING THIS ORGANISM FROM A SINGLE SET OF BLOOD CULTURES WHEN MULTIPLE SETS ARE DRAWN IS UNCERTAIN. PLEASE NOTIFY THE MICROBIOLOGY DEPARTMENT WITHIN ONE WEEK IF SPECIATION AND SENSITIVITIES ARE REQUIRED. Performed at [facility], [address] [place], [place] [address]    Report Status 04/27/2024 FINAL  Final  Blood Culture ID Panel (Reflexed)     Status: Abnormal    Collection Time: 04/23/24 11:41 PM  Result Value Ref Range Status   Enterococcus faecalis NOT DETECTED NOT DETECTED Final   Enterococcus Faecium NOT DETECTED NOT DETECTED Final   Listeria monocytogenes NOT DETECTED NOT DETECTED Final   Staphylococcus species DETECTED (A) NOT DETECTED Final    Comment: CRITICAL RESULT CALLED TO, READ BACK BY AND VERIFIED WITH: PHARMD J WYLAND [id number] AT 645 AM BY CM    Staphylococcus aureus (BCID) NOT DETECTED NOT DETECTED Final   Staphylococcus epidermidis NOT DETECTED NOT DETECTED Final   Staphylococcus lugdunensis NOT DETECTED NOT DETECTED Final   Streptococcus species NOT DETECTED NOT DETECTED Final   Streptococcus agalactiae NOT DETECTED NOT DETECTED Final   Streptococcus pneumoniae NOT DETECTED NOT DETECTED Final   Streptococcus pyogenes NOT DETECTED NOT DETECTED Final   A.calcoaceticus-baumannii NOT DETECTED NOT DETECTED Final   Bacteroides fragilis NOT DETECTED NOT DETECTED Final   Enterobacterales NOT DETECTED NOT DETECTED Final   Enterobacter cloacae complex NOT DETECTED NOT DETECTED Final   Escherichia coli NOT DETECTED NOT DETECTED Final   Klebsiella aerogenes NOT DETECTED NOT DETECTED Final   Klebsiella oxytoca NOT DETECTED NOT DETECTED Final   Klebsiella pneumoniae NOT DETECTED NOT DETECTED Final   Proteus species NOT DETECTED NOT DETECTED Final   Salmonella species NOT DETECTED NOT DETECTED Final   Serratia marcescens NOT DETECTED NOT DETECTED Final   Haemophilus influenzae NOT DETECTED NOT DETECTED Final   Neisseria meningitidis NOT DETECTED NOT DETECTED Final   Pseudomonas aeruginosa NOT DETECTED NOT DETECTED Final   Stenotrophomonas maltophilia NOT DETECTED NOT DETECTED Final   Candida albicans NOT DETECTED NOT DETECTED Final   Candida auris NOT DETECTED NOT DETECTED Final   Candida glabrata NOT DETECTED NOT DETECTED Final   Candida krusei NOT DETECTED NOT DETECTED Final   Candida parapsilosis NOT DETECTED NOT DETECTED Final    Candida tropicalis NOT DETECTED NOT DETECTED Final   Cryptococcus neoformans/gattii NOT DETECTED NOT DETECTED Final  Comment: Performed at [facility], [address] [place], [place] [address]  Blood culture (routine x 2)     Status: None   Collection Time: 04/23/24 11:46 PM   Specimen: BLOOD  Result Value Ref Range Status   Specimen Description BLOOD SITE NOT SPECIFIED  Final   Special Requests   Final    BOTTLES DRAWN AEROBIC AND ANAEROBIC Blood Culture results may not be optimal due to an inadequate volume of blood received in culture bottles   Culture   Final    NO GROWTH 5 DAYS Performed at [facility], [address], [place], [place] [address]    Report Status 04/29/2024 FINAL  Final  Resp panel by RT-PCR (RSV, Flu A&B, Covid) Anterior Nasal Swab     Status: None   Collection Time: 04/24/24 12:33 AM   Specimen: Anterior Nasal Swab  Result Value Ref Range Status   SARS Coronavirus 2 by RT PCR NEGATIVE NEGATIVE Final   Influenza A by PCR NEGATIVE NEGATIVE Final   Influenza B by PCR NEGATIVE NEGATIVE Final    Comment: (NOTE) The Xpert Xpress SARS-CoV-2/FLU/RSV plus assay is intended as an aid in the diagnosis of influenza from Nasopharyngeal swab specimens and should not be used as a sole basis for treatment. Nasal washings and aspirates are unacceptable for Xpert Xpress SARS-CoV-2/FLU/RSV testing.  Fact Sheet for Patients: BloggerCourse.com  Fact Sheet for Healthcare Providers: SeriousBroker.it  This test is not yet approved or cleared by the [place]  FDA and has been authorized for detection and/or diagnosis of SARS-CoV-2 by FDA under an Emergency Use Authorization (EUA). This EUA will remain in effect (meaning this test can be used) for the duration of the COVID-19 declaration under Section 564(b)(1) of the Act, 21 U.S.C. section 360bbb-3(b)(1), unless the authorization is terminated  or revoked.     Resp Syncytial Virus by PCR NEGATIVE NEGATIVE Final    Comment: (NOTE) Fact Sheet for Patients: BloggerCourse.com  Fact Sheet for Healthcare Providers: SeriousBroker.it  This test is not yet approved or cleared by the [place]  FDA and has been authorized for detection and/or diagnosis of SARS-CoV-2 by FDA under an Emergency Use Authorization (EUA). This EUA will remain in effect (meaning this test can be used) for the duration of the COVID-19 declaration under Section 564(b)(1) of the Act, 21 U.S.C. section 360bbb-3(b)(1), unless the authorization is terminated or revoked.  Performed at [facility], [address], [place], [place] [address]   MRSA Next Gen by PCR, Nasal     Status: None   Collection Time: 04/25/24  6:25 AM   Specimen: Nasal Mucosa; Nasal Swab  Result Value Ref Range Status   MRSA by PCR Next Gen NOT DETECTED NOT DETECTED Final    Comment: (NOTE) The GeneXpert MRSA Assay (FDA approved for NASAL specimens only), is one component of a comprehensive MRSA colonization surveillance program. It is not intended to diagnose MRSA infection nor to guide or monitor treatment for MRSA infections. Test performance is not FDA approved in patients less than 74 years old. Performed at [facility], [address], [place], [place] [address]     Today   Subjective    Traivon Morrical today has no headache,no chest abdominal pain,no new weakness tingling or numbness, feels much better wants to go home today.    Objective   Blood pressure 137/85, pulse 67, temperature 98 F (36.7 C), temperature source Oral, resp. rate 16, height 5' 8 (  1.727 m), weight 78 kg, SpO2 96%.   Intake/Output Summary (Last 24 hours) at 04/29/2024 1004 Last data filed at 04/28/2024 1843 Gross per 24 hour  Intake --  Output 500 ml  Net -500 ml    Exam  Awake Alert, No new F.N deficits,     [place].AT,PERRAL Supple Neck,   Symmetrical Chest wall movement, Good air movement bilaterally, CTAB RRR,No Gallops,   +ve B.Sounds, Abd Soft, Non tender,  No Cyanosis, Clubbing or edema    Data Review   Recent Labs  Lab 04/23/24 2355 04/24/24 0448 04/25/24 0514 04/26/24 0432  WBC 5.9 9.6 11.6* 10.0  HGB 15.4 12.8* 12.7* 13.0  HCT 48.0 39.8 39.2 39.2  PLT 123* 129* 138* 155  MCV 91.1 91.7 89.5 88.5  MCH 29.2 29.5 29.0 29.3  MCHC 32.1 32.2 32.4 33.2  RDW 15.1 15.0 15.1 14.8  LYMPHSABS 1.2 0.4* 0.7 0.6*  MONOABS 0.5 0.4 0.8 0.5  EOSABS 0.0 0.0 0.0 0.0  BASOSABS 0.0 0.0 0.0 0.0    Recent Labs  Lab 04/23/24 2355 04/24/24 0005 04/24/24 0130 04/24/24 0448 04/24/24 1635 04/25/24 0514 04/26/24 0432  NA 137  --   --  134*  --  138 137  K 4.2  --   --  4.2  --  4.4 4.2  CL 101  --   --  104  --  106 102  CO2 24  --   --  21*  --  22 26  ANIONGAP 12  --   --  9  --  10 9  GLUCOSE 125*  --   --  171*  --  91 102*  BUN 32*  --   --  31*  --  36* 38*  CREATININE 1.68*  --   --  1.77*  --  1.49* 1.47*  AST 39  --   --  31  --   --   --   ALT 22  --   --  16  --   --   --   ALKPHOS 52  --   --  38  --   --   --   BILITOT 1.1  --   --  0.8  --   --   --   ALBUMIN 3.8  --   --  2.8*  --   --   --   CRP  --   --   --   --   --  7.0* 3.9*  PROCALCITON  --   --   --   --   --  0.15 <0.10  LATICACIDVEN  --  2.2* 3.1*  --   --   --   --   TSH  --   --   --  1.586  --   --   --   AMMONIA  --   --   --   --  13  --   --   BNP 798.5*  --   --   --   --  910.0* 2,044.3*  MG  --   --   --  1.6*  --  1.8 2.3  PHOS  --   --   --   --   --   --  3.4  CALCIUM  9.9  --   --  9.0  --  9.5 9.5    Total Time in preparing paper work, data evaluation and todays exam - 35 minutes  Signature  -  Lavada Stank M.D on 04/29/2024 at 10:04 AM   -  To page go to [email or web address]

## 2024-04-29 NOTE — TOC Transition Note (Signed)
 Transition of Care [facility]) - Discharge Note   Patient Details  Name: Mike Gray MRN: [id number] Date of Birth: [birth year]  Transition of Care [facility]) CM/SW Contact:  Jeoffrey LITTIE Moose, LCSW Phone Number: 04/29/2024, 11:46 AM   Clinical Narrative:    Patient will DC to: [facility] Anticipated DC date: 04/29/2024 Family notified: Yes Transport by: [facility]   Per MD patient ready for DC to [facility] . RN to call report prior to discharge [phone] . RN, patient, patient's family, and facility notified of DC. Discharge Summary and FL2 sent to facility. DC packet on chart. Ambulance transport requested for patient.   CSW will sign off for now as social work intervention is no longer needed. Please consult us  again if new needs arise.     Final next level of care: Skilled Nursing Facility Barriers to Discharge: Barriers Resolved   Patient Goals and CMS Choice Patient states their goals for this hospitalization and ongoing recovery are:: SNF CMS Medicare.gov Compare Post Acute Care list provided to:: Patient Represenative (must comment) Choice offered to / list presented to : Sibling [place] ownership interest in [facility].provided to:: Sibling    Discharge Placement   Existing PASRR number confirmed : 04/29/24          Patient chooses bed at: [facility] Patient to be transferred to facility by: [facility] Name of family member notified: Lamar Patient and family notified of of transfer: 04/29/24  Discharge Plan and Services Additional resources added to the After Visit Summary for   In-house Referral: Clinical Social Work   Post Acute Care Choice: Skilled Nursing Facility                               Social Drivers of Health (SDOH) Interventions SDOH Screenings   Food Insecurity: No Food Insecurity (04/24/2024)  Housing: Low Risk  (04/24/2024)  Transportation Needs: No Transportation Needs (04/24/2024)  Utilities: Not At Risk (04/24/2024)  Depression (PHQ2-9):  Low Risk  (07/18/2022)  Social Connections: Socially Isolated (04/24/2024)  Tobacco Use: Medium Risk (04/24/2024)     Readmission Risk Interventions     No data to display

## 2024-04-29 NOTE — Plan of Care (Signed)
 Patient is stable. He did have some periods of forgetfulness last night but he was very redirectable.

## 2024-05-02 ENCOUNTER — Other Ambulatory Visit: Payer: Self-pay | Admitting: Internal Medicine

## 2024-05-02 DIAGNOSIS — J4489 Other specified chronic obstructive pulmonary disease: Secondary | ICD-10-CM

## 2024-05-06 ENCOUNTER — Ambulatory Visit: Admitting: Podiatry

## 2024-06-03 ENCOUNTER — Telehealth: Payer: Self-pay | Admitting: Cardiology

## 2024-06-03 ENCOUNTER — Telehealth: Payer: Self-pay

## 2024-06-03 NOTE — Telephone Encounter (Signed)
 OT wants to know if he can follow the pt for 1x a week for 5 weeks.

## 2024-06-03 NOTE — Telephone Encounter (Signed)
 Left a message to call back.

## 2024-06-03 NOTE — Telephone Encounter (Signed)
 Copied from CRM [id number]. Topic: Clinical - Medical Advice >> Jun 02, 2024 12:03 PM Isabell A wrote: Reason for CRM: Brother - Lamar states patient was seen in the hospital on 7/2 & was put is on oxygen  PRN because he had pneumonia, would like to keep Dr.Desai updated and would like to know if she would like him to come in.  Callback number: [phone]   Called and spoke with patients brother Lamar (on [place]).  Patient was recently released from [facility] in [place].  He wants to know if Dr. Meade would like for patient to make a follow up visit to see the patient.  Patient is doing well at this time.  His brother is concerned if he may need oxygen again in the future.  Lamar will come by the clinic tomorrow and sign a medical release of information to get patients medical records from [facility].  Dr. Meade, when would you like for patient to follow up with you?  Please advise.

## 2024-06-09 NOTE — Telephone Encounter (Signed)
 Spoke with jim, he reports the patient sees Dr Inocencio for his primary care issues and wants to know if he will sign OT, PT or nursing orders. 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 Aware will find out if dr inocencio will sign orders.

## 2024-06-11 NOTE — Telephone Encounter (Signed)
 1st attempt Called PT no answer left VM.

## 2024-06-11 NOTE — Telephone Encounter (Signed)
 Per Dr. Meade, left message for patient to call to make OV for next available opening with Dr. Meade for hospital follow up.  Will route to front office team.

## 2024-06-11 NOTE — Telephone Encounter (Signed)
 Please schedule for hospital follow up wth me next available

## 2024-06-12 NOTE — Telephone Encounter (Signed)
 Patient has OV with Dr. Desai on 08/06/2024 at 4:00 pm.

## 2024-06-26 NOTE — Progress Notes (Signed)
 Remote ICD transmission.

## 2024-07-08 ENCOUNTER — Observation Stay (HOSPITAL_COMMUNITY): Admission: EM | Admit: 2024-07-08 | Discharge: 2024-07-12 | Disposition: A | Discharge status: Home / Self Care

## 2024-07-08 ENCOUNTER — Observation Stay (HOSPITAL_COMMUNITY)

## 2024-07-08 ENCOUNTER — Emergency Department (HOSPITAL_COMMUNITY)

## 2024-07-08 ENCOUNTER — Other Ambulatory Visit: Payer: Self-pay

## 2024-07-08 ENCOUNTER — Encounter (HOSPITAL_COMMUNITY): Payer: Self-pay

## 2024-07-08 DIAGNOSIS — D696 Thrombocytopenia, unspecified: Secondary | ICD-10-CM | POA: Insufficient documentation

## 2024-07-08 DIAGNOSIS — N179 Acute kidney failure, unspecified: Secondary | ICD-10-CM | POA: Diagnosis not present

## 2024-07-08 DIAGNOSIS — R634 Abnormal weight loss: Secondary | ICD-10-CM | POA: Diagnosis not present

## 2024-07-08 DIAGNOSIS — G252 Other specified forms of tremor: Secondary | ICD-10-CM | POA: Insufficient documentation

## 2024-07-08 DIAGNOSIS — R350 Frequency of micturition: Secondary | ICD-10-CM

## 2024-07-08 DIAGNOSIS — N400 Enlarged prostate without lower urinary tract symptoms: Secondary | ICD-10-CM | POA: Diagnosis not present

## 2024-07-08 DIAGNOSIS — G3184 Mild cognitive impairment, so stated: Secondary | ICD-10-CM | POA: Insufficient documentation

## 2024-07-08 DIAGNOSIS — Z950 Presence of cardiac pacemaker: Secondary | ICD-10-CM | POA: Insufficient documentation

## 2024-07-08 DIAGNOSIS — Z7901 Long term (current) use of anticoagulants: Secondary | ICD-10-CM | POA: Diagnosis not present

## 2024-07-08 DIAGNOSIS — R5381 Other malaise: Secondary | ICD-10-CM | POA: Diagnosis present

## 2024-07-08 DIAGNOSIS — Z8679 Personal history of other diseases of the circulatory system: Secondary | ICD-10-CM

## 2024-07-08 DIAGNOSIS — I5022 Chronic systolic (congestive) heart failure: Secondary | ICD-10-CM | POA: Diagnosis present

## 2024-07-08 DIAGNOSIS — I130 Hypertensive heart and chronic kidney disease with heart failure and stage 1 through stage 4 chronic kidney disease, or unspecified chronic kidney disease: Secondary | ICD-10-CM | POA: Diagnosis not present

## 2024-07-08 DIAGNOSIS — F319 Bipolar disorder, unspecified: Secondary | ICD-10-CM | POA: Diagnosis present

## 2024-07-08 DIAGNOSIS — D509 Iron deficiency anemia, unspecified: Secondary | ICD-10-CM | POA: Insufficient documentation

## 2024-07-08 DIAGNOSIS — I480 Paroxysmal atrial fibrillation: Secondary | ICD-10-CM | POA: Diagnosis present

## 2024-07-08 DIAGNOSIS — R251 Tremor, unspecified: Secondary | ICD-10-CM

## 2024-07-08 DIAGNOSIS — D649 Anemia, unspecified: Secondary | ICD-10-CM | POA: Diagnosis present

## 2024-07-08 DIAGNOSIS — J449 Chronic obstructive pulmonary disease, unspecified: Secondary | ICD-10-CM | POA: Diagnosis not present

## 2024-07-08 DIAGNOSIS — N1831 Chronic kidney disease, stage 3a: Secondary | ICD-10-CM | POA: Insufficient documentation

## 2024-07-08 DIAGNOSIS — I472 Ventricular tachycardia, unspecified: Secondary | ICD-10-CM | POA: Diagnosis present

## 2024-07-08 DIAGNOSIS — N401 Enlarged prostate with lower urinary tract symptoms: Secondary | ICD-10-CM | POA: Diagnosis present

## 2024-07-08 DIAGNOSIS — N1832 Chronic kidney disease, stage 3b: Secondary | ICD-10-CM

## 2024-07-08 DIAGNOSIS — I422 Other hypertrophic cardiomyopathy: Secondary | ICD-10-CM

## 2024-07-08 DIAGNOSIS — U071 COVID-19: Secondary | ICD-10-CM | POA: Diagnosis not present

## 2024-07-08 DIAGNOSIS — E7800 Pure hypercholesterolemia, unspecified: Secondary | ICD-10-CM | POA: Diagnosis present

## 2024-07-08 DIAGNOSIS — Z87891 Personal history of nicotine dependence: Secondary | ICD-10-CM | POA: Insufficient documentation

## 2024-07-08 DIAGNOSIS — R0600 Dyspnea, unspecified: Secondary | ICD-10-CM | POA: Diagnosis present

## 2024-07-08 DIAGNOSIS — N183 Chronic kidney disease, stage 3 unspecified: Secondary | ICD-10-CM | POA: Diagnosis present

## 2024-07-08 DIAGNOSIS — Z95810 Presence of automatic (implantable) cardiac defibrillator: Secondary | ICD-10-CM | POA: Diagnosis present

## 2024-07-08 LAB — URINALYSIS, COMPLETE (UACMP) WITH MICROSCOPIC
Bacteria, UA: NONE SEEN
Bilirubin Urine: NEGATIVE
Glucose, UA: NEGATIVE mg/dL
Hgb urine dipstick: NEGATIVE
Ketones, ur: 5 mg/dL — AB
Leukocytes,Ua: NEGATIVE
Nitrite: NEGATIVE
Protein, ur: NEGATIVE mg/dL
Specific Gravity, Urine: 1.019 (ref 1.005–1.030)
pH: 5 (ref 5.0–8.0)

## 2024-07-08 LAB — COMPREHENSIVE METABOLIC PANEL WITH GFR
ALT: 16 U/L (ref 0–44)
AST: 25 U/L (ref 15–41)
Albumin: 3 g/dL — ABNORMAL LOW (ref 3.5–5.0)
Alkaline Phosphatase: 44 U/L (ref 38–126)
Anion gap: 9 (ref 5–15)
BUN: 25 mg/dL — ABNORMAL HIGH (ref 8–23)
CO2: 21 mmol/L — ABNORMAL LOW (ref 22–32)
Calcium: 9.4 mg/dL (ref 8.9–10.3)
Chloride: 106 mmol/L (ref 98–111)
Creatinine, Ser: 1.38 mg/dL — ABNORMAL HIGH (ref 0.61–1.24)
GFR, Estimated: 51 mL/min — ABNORMAL LOW (ref >60)
Glucose, Bld: 87 mg/dL (ref 70–99)
Potassium: 3.9 mmol/L (ref 3.5–5.1)
Sodium: 136 mmol/L (ref 135–145)
Total Bilirubin: 0.4 mg/dL (ref 0.0–1.2)
Total Protein: 6.1 g/dL — ABNORMAL LOW (ref 6.5–8.1)

## 2024-07-08 LAB — CBC
HCT: 38.5 % — ABNORMAL LOW (ref 39.0–52.0)
Hemoglobin: 12.3 g/dL — ABNORMAL LOW (ref 13.0–17.0)
MCH: 29.1 pg (ref 26.0–34.0)
MCHC: 31.9 g/dL (ref 30.0–36.0)
MCV: 91 fL (ref 80.0–100.0)
Platelets: 98 K/uL — ABNORMAL LOW (ref 150–400)
RBC: 4.23 MIL/uL (ref 4.22–5.81)
RDW: 16.2 % — ABNORMAL HIGH (ref 11.5–15.5)
WBC: 4.7 K/uL (ref 4.0–10.5)
nRBC: 0 % (ref 0.0–0.2)

## 2024-07-08 LAB — CBG MONITORING, ED: Glucose-Capillary: 80 mg/dL (ref 70–99)

## 2024-07-08 LAB — RESP PANEL BY RT-PCR (RSV, FLU A&B, COVID)  RVPGX2
Influenza A by PCR: NEGATIVE
Influenza B by PCR: NEGATIVE
Resp Syncytial Virus by PCR: NEGATIVE
SARS Coronavirus 2 by RT PCR: POSITIVE — AB

## 2024-07-08 MED ORDER — ONDANSETRON HCL 4 MG/2ML IJ SOLN: 4.0000 mg | Freq: Four times a day (QID) | INTRAMUSCULAR | Status: DC | PRN

## 2024-07-08 MED ORDER — ATORVASTATIN CALCIUM 10 MG PO TABS
20.0000 mg | ORAL_TABLET | Freq: Every day | ORAL | Status: DC
  Administered 2024-07-09 – 2024-07-11 (×4): 20 mg via ORAL
  Filled 2024-07-08 (×4): qty 2

## 2024-07-08 MED ORDER — ACETAMINOPHEN 650 MG RE SUPP: 650.0000 mg | Freq: Four times a day (QID) | RECTAL | Status: DC | PRN

## 2024-07-08 MED ORDER — APIXABAN 5 MG PO TABS
5.0000 mg | ORAL_TABLET | Freq: Two times a day (BID) | ORAL | Status: DC
  Administered 2024-07-09 – 2024-07-12 (×8): 5 mg via ORAL
  Filled 2024-07-08 (×8): qty 1

## 2024-07-08 MED ORDER — HYDROCODONE-ACETAMINOPHEN 5-325 MG PO TABS: 1.0000 | ORAL_TABLET | ORAL | Status: DC | PRN

## 2024-07-08 MED ORDER — FINASTERIDE 5 MG PO TABS
5.0000 mg | ORAL_TABLET | Freq: Every day | ORAL | Status: DC
  Administered 2024-07-09 – 2024-07-11 (×4): 5 mg via ORAL
  Filled 2024-07-08 (×4): qty 1

## 2024-07-08 MED ORDER — ONDANSETRON HCL 4 MG PO TABS: 4.0000 mg | ORAL_TABLET | Freq: Four times a day (QID) | ORAL | Status: DC | PRN

## 2024-07-08 MED ORDER — BUDESON-GLYCOPYRROL-FORMOTEROL 160-9-4.8 MCG/ACT IN AERO: 2.0000 | INHALATION_SPRAY | Freq: Two times a day (BID) | RESPIRATORY_TRACT | Status: DC

## 2024-07-08 MED ORDER — THIAMINE HCL 100 MG/ML IJ SOLN
100.0000 mg | Freq: Every day | INTRAMUSCULAR | Status: DC
  Administered 2024-07-08 – 2024-07-10 (×3): 100 mg via INTRAVENOUS
  Filled 2024-07-08 (×3): qty 2

## 2024-07-08 MED ORDER — SODIUM CHLORIDE 0.9 % IV SOLN: 250.0000 mL | INTRAVENOUS | Status: AC | PRN

## 2024-07-08 MED ORDER — ACETAMINOPHEN 325 MG PO TABS
650.0000 mg | ORAL_TABLET | Freq: Four times a day (QID) | ORAL | Status: DC | PRN
  Administered 2024-07-09: 650 mg via ORAL
  Filled 2024-07-08: qty 2

## 2024-07-08 MED ORDER — BUDESON-GLYCOPYRROL-FORMOTEROL 160-9-4.8 MCG/ACT IN AERO
2.0000 | INHALATION_SPRAY | Freq: Two times a day (BID) | RESPIRATORY_TRACT | Status: DC
Start: 2024-07-09 — End: 2024-07-12
  Administered 2024-07-09 – 2024-07-12 (×6): 2 via RESPIRATORY_TRACT
  Filled 2024-07-08: qty 5.9

## 2024-07-08 MED ORDER — SODIUM CHLORIDE 0.9% FLUSH: 3.0000 mL | INTRAVENOUS | Status: DC | PRN

## 2024-07-08 MED ORDER — AMIODARONE HCL 200 MG PO TABS
100.0000 mg | ORAL_TABLET | Freq: Every day | ORAL | Status: DC
Start: 2024-07-09 — End: 2024-07-10
  Administered 2024-07-09: 100 mg via ORAL
  Filled 2024-07-08: qty 1

## 2024-07-08 MED ORDER — TAMSULOSIN HCL 0.4 MG PO CAPS
0.8000 mg | ORAL_CAPSULE | Freq: Every day | ORAL | Status: DC
  Administered 2024-07-09 – 2024-07-11 (×3): 0.8 mg via ORAL
  Filled 2024-07-08 (×3): qty 2

## 2024-07-08 MED ORDER — SODIUM CHLORIDE 0.9% FLUSH
3.0000 mL | Freq: Two times a day (BID) | INTRAVENOUS | Status: DC
  Administered 2024-07-09 – 2024-07-12 (×7): 3 mL via INTRAVENOUS

## 2024-07-08 NOTE — Assessment & Plan Note (Signed)
 Continue amiodarone  Continue Eliquis  5 mg p.o. twice daily and restart metoprolol 

## 2024-07-08 NOTE — ED Triage Notes (Signed)
 Pt bib [facility] coming from home with CC of weakness. Pt reportedly had a fall this morning and again earlier before EMS arrival. Pt fell on knees, with no loc/head injury. Pt is on eliquis . Pt denies pain. Pt alert and oriented x3, at baseline. Pt arrives in c-collar placed by EMS.  EMS VS: 118/70 86 HR 16 RR 93% RA 113 cbg

## 2024-07-08 NOTE — Subjective & Objective (Addendum)
 Pt comes in with fatigue he has chronic debility he lives with his brother Has not been able to get around the house well his COVID was positive  In ER was too tired and DOE with ambulation.  Ne is no on o2 at baseline

## 2024-07-08 NOTE — ED Provider Notes (Signed)
   ED Course / MDM   Clinical Course as of 07/08/24 1954  Tue Jul 08, 2024  1553 Received sign out pending ambulation trial. Pt has covid and more weak than normal. If he can ambulate likely discharge. If too weak likely discharge.  [WS]  1953 Patient did not do very well with ambulation.  He will need to be admitted for further inpatient care and possible placement.  Discussed with Dr. Silvester who will admit patient. [WS]    Clinical Course User Index [WS] Francesca Elsie CROME, MD   Medical Decision Making Amount and/or Complexity of Data Reviewed Labs: ordered. Radiology: ordered.  Risk Decision regarding hospitalization.         Francesca Elsie CROME, MD 07/08/24 [phone]

## 2024-07-08 NOTE — Assessment & Plan Note (Signed)
 Continue Lipitor 20 mg daily.

## 2024-07-08 NOTE — Assessment & Plan Note (Signed)
 Obtain anemia panel  Transfuse for Hg <7 , rapidly dropping or  if symptomatic

## 2024-07-08 NOTE — Assessment & Plan Note (Signed)
 Continue Proscar  5 mg daily at bedtime and Flomax  0.4 mg daily

## 2024-07-08 NOTE — Assessment & Plan Note (Signed)
Chronic stable followed by cardiology

## 2024-07-08 NOTE — Assessment & Plan Note (Addendum)
 No infiltrate no hypoxia patient appears to be stable from respiratory standpoint illness has been ongoing for the past 3 to 4 days but did result in generalized fatigue and debility.  May benefit from PT OT prior to discharge.  And supportive management will observe overnight given extensive medical problems Hold off on antivirals for right now

## 2024-07-08 NOTE — H&P (Signed)
 Mike Gray FMW:[id number] DOB: Sep 12, 1941 DOA: 07/08/2024     PCP: No primary care provider on file.   Outpatient Specialists:   CARDS:  Dr. Stanly DELENA Leavens, MD EP cardiology Dr. Inocencio Patient arrived to ER on 07/08/24 at 1126 Referred by Attending Francesca Elsie CROME, MD   Patient coming from:    home Lives   With family    Chief Complaint:   Chief Complaint  Patient presents with   Weakness    HPI: Mike Gray is a 83 y.o. 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  Endorses sore throat cough Pt comes in with fatigue he has chronic debility he lives with his brother Has not been able to get around the house well his COVID was positive  In ER was too tired and DOE with ambulation.  Ne is no on o2 at baseline      Denies significant ETOH intake   Does not smoke   Regarding pertinent Chronic problems:    Hyperlipidemia -  on statins Lipitor Lipid Panel     Component Value Date/Time   CHOL 87 07/29/2021 0354   TRIG 63 07/29/2021 0354   HDL 34 (L) 07/29/2021 0354   CHOLHDL 2.6 07/29/2021 0354   VLDL 13 07/29/2021 0354   LDLCALC 40 07/29/2021 0354     HTN on metoprolol    chronic CHF diastolic/systolic/ combined -  On Lasix  last echo  Recent Results (from the past 56199 hours)  ECHOCARDIOGRAM COMPLETE   Collection Time: 04/24/24  9:54 AM  Result Value   Weight 2,751.34   Height 68   BP 106/60   Single Plane A4C EF 46.5   S' Lateral 5.00   Area-P 1/2 3.27   Est EF 30 - 35%   Narrative      ECHOCARDIOGRAM REPORT        IMPRESSIONS    1. Left ventricular ejection fraction, by estimation, is 30 to 35%.  The left ventricle has moderately decreased function. The left ventricle demonstrates global hypokinesis. The left ventricular internal cavity size was severely dilated. There is  moderate concentric left ventricular hypertrophy. Left ventricular diastolic function could not be evaluated.  2. Right ventricular systolic function is normal. The right ventricular size is normal. There is normal pulmonary artery systolic pressure.  3. Left atrial size was severely dilated.  4. Right atrial size was moderately dilated.  5. The mitral valve is degenerative. Trivial mitral valve regurgitation. No evidence of mitral stenosis.  6. The aortic valve is grossly normal. There is moderate calcification of the aortic valve. There is mild thickening of the aortic valve. Aortic valve regurgitation is not visualized. Aortic valve sclerosis/calcification is present, without any evidence  of aortic stenosis.  7. The inferior vena cava is dilated in size with >50% respiratory variability, suggesting right atrial pressure of 8 mmHg.                COPD  not  on baseline oxygen       A. Fib -   atrial fibrillation CHA2DS2 vas score   5      current  on anticoagulation with   Eliquis ,           -  Rate control:  Currently controlled with  Toprolol,          - Rhythm control:   amiodarone ,       CKD stage IIIb  baseline Cr 1.5 CrCl cannot be calculated (Unknown ideal weight.).  Lab Results  Component Value Date   CREATININE 1.38 (H) 07/08/2024   CREATININE 1.47 (H) 04/26/2024   CREATININE 1.49 (H) 04/25/2024   Lab Results  Component Value Date   NA 136 07/08/2024   CL 106 07/08/2024   K 3.9 07/08/2024   CO2 21 (L) 07/08/2024   BUN 25 (H) 07/08/2024   CREATININE 1.38 (H) 07/08/2024   GFRNONAA 51 (L) 07/08/2024   CALCIUM  9.4 07/08/2024   PHOS 3.4 04/26/2024   ALBUMIN 3.0 (L) 07/08/2024   GLUCOSE 87 07/08/2024     BPH - on Flomax , Proscar      Chronic anemia - baseline hg Hemoglobin & Hematocrit   Recent Labs    04/25/24 0514 04/26/24 0432 07/08/24 1255  HGB 12.7* 13.0 12.3*   Iron /TIBC/Ferritin/ %Sat No results found for: IRON , TIBC, FERRITIN, IRONPCTSAT    While in ER: Clinical Course as of 07/08/24 1955  Tue Jul 08, 2024  1553 Received sign out pending ambulation trial. Pt has covid and more weak than normal. If he can ambulate likely discharge. If too weak likely discharge.  [WS]  1953 Patient did not do very well with ambulation.  He will need to be admitted for further inpatient care and possible placement.  Discussed with Dr. Silvester who will admit patient. [WS]    Clinical Course User Index [WS] Francesca Elsie CROME, MD     Lab Orders         Resp panel by RT-PCR (RSV, Flu A&B, Covid) Anterior Nasal Swab         Comprehensive metabolic panel         CBC         Urinalysis, Routine w reflex microscopic -Urine, Clean Catch         CBG monitoring, ED      CT HEAD   NON acute     CXR -  NON acute    Following Medications were ordered in ER: Medications - No data to display       ED Triage Vitals  Encounter Vitals Group     BP 07/08/24 1130 (!) 162/121     Girls Systolic BP Percentile --      Girls Diastolic BP Percentile --      Boys Systolic BP Percentile --      Boys Diastolic BP Percentile --      Pulse Rate 07/08/24 1130 84     Resp 07/08/24 1130 12     Temp 07/08/24 1131 99.1 F (37.3 C)     Temp Source 07/08/24 1131 Oral     SpO2 07/08/24 1130 97 %     Weight --      Height --      Head Circumference --      Peak Flow --      Pain Score --      Pain Loc --      Pain Education --      Exclude from Growth Chart --   UFJK(75)@     _________________________________________ Significant initial  Findings: Abnormal Labs Reviewed  RESP PANEL BY RT-PCR (RSV, FLU A&B, COVID)  [id number] - Abnormal; Notable for the following components:      Result Value   SARS  Coronavirus 2 by RT PCR POSITIVE (*)    All other components within normal limits   COMPREHENSIVE METABOLIC PANEL WITH GFR - Abnormal; Notable for the following components:   CO2 21 (*)    BUN 25 (*)    Creatinine, Ser 1.38 (*)    Total Protein 6.1 (*)    Albumin 3.0 (*)    GFR, Estimated 51 (*)    All other components within normal limits  CBC - Abnormal; Notable for the following components:   Hemoglobin 12.3 (*)    HCT 38.5 (*)    RDW 16.2 (*)    Platelets 98 (*)    All other components within normal limits        ECG: Ordered Personally reviewed and interpreted by me showing: HR : 74 Rhythm: Paced   BNP (last 3 results) Recent Labs    04/23/24 2355 04/25/24 0514 04/26/24 0432  BNP 798.5* 910.0* 2,044.3*     The recent clinical data is shown below. Vitals:   07/08/24 1745 07/08/24 1800 07/08/24 1815 07/08/24 1900  BP: 115/67 124/65 132/70 (!) 123/58  Pulse: 63 72 66 62  Resp: 19 20 (!) 21 20  Temp:      TempSrc:      SpO2: 96% 99% 98% 99%      WBC     Component Value Date/Time   WBC 4.7 07/08/2024 1255   LYMPHSABS 0.6 (L) 04/26/2024 0432   LYMPHSABS 1.1 07/10/2022 1121   MONOABS 0.5 04/26/2024 0432   EOSABS 0.0 04/26/2024 0432   EOSABS 0.1 07/10/2022 1121   BASOSABS 0.0 04/26/2024 0432   BASOSABS 0.0 07/10/2022 1121     Procalcitonin   Ordered      Results for orders placed or performed during the hospital encounter of 07/08/24  Resp panel by RT-PCR (RSV, Flu A&B, Covid) Anterior Nasal Swab     Status: Abnormal   Collection Time: 07/08/24 12:42 PM   Specimen: Anterior Nasal Swab  Result Value Ref Range Status   SARS Coronavirus 2 by RT PCR POSITIVE (A) NEGATIVE Final   Influenza A by PCR NEGATIVE NEGATIVE Final   Influenza B by PCR NEGATIVE NEGATIVE Final         Resp Syncytial Virus by PCR NEGATIVE NEGATIVE Final    Comment: (NOTE) Fact Sheet for Patients: BloggerCourse.com  Fact Sheet for Healthcare Providers: SeriousBroker.it  This test is not yet approved or cleared  by the [place]  FDA and has been authorized for detection and/or diagnosis of SARS-CoV-2 by FDA under an Emergency Use Authorization (EUA). This EUA will remain in effect (meaning this test can be used) for the duration of the COVID-19 declaration under Section 564(b)(1) of the Act, 21 U.S.C. section 360bbb-3(b)(1), unless the authorization is terminated or revoked.  Performed at [facility], [address], [place], [place] [address]     __________________________________________________________ Recent Labs  Lab 07/08/24 1255  NA 136  K 3.9  CO2 21*  GLUCOSE 87  BUN 25*  CREATININE 1.38*  CALCIUM  9.4    Cr  stable,   Lab Results  Component Value Date   CREATININE 1.38 (H) 07/08/2024   CREATININE 1.47 (H) 04/26/2024   CREATININE 1.49 (H) 04/25/2024    Recent Labs  Lab 07/08/24 1255  AST 25  ALT 16  ALKPHOS 44  BILITOT 0.4  PROT 6.1*  ALBUMIN 3.0*   Lab Results  Component Value Date   CALCIUM  9.4 07/08/2024   PHOS 3.4 04/26/2024  Plt: Lab Results  Component Value Date   PLT 98 (L) 07/08/2024    Recent Labs  Lab 07/08/24 1255  WBC 4.7  HGB 12.3*  HCT 38.5*  MCV 91.0  PLT 98*    HG/HCT  stable,       Component Value Date/Time   HGB 12.3 (L) 07/08/2024 1255   HGB 13.6 03/04/2024 1155   HCT 38.5 (L) 07/08/2024 1255   HCT 42.5 03/04/2024 1155   MCV 91.0 07/08/2024 1255   MCV 94 03/04/2024 1155       _______________________________________________ Hospitalist was called for admission for   COVID-19 infection and debility    The following Work up has been ordered so far:  Orders Placed This Encounter  Procedures   Resp panel by RT-PCR (RSV, Flu A&B, Covid) Anterior Nasal Swab   CT Head Wo Contrast   Comprehensive metabolic panel   CBC   Urinalysis, Routine w reflex microscopic -Urine, Clean Catch   Diet NPO time specified   Document Height and Actual Weight   Consult to hospitalist   CBG monitoring, ED   ED EKG   EKG  12-Lead     OTHER Significant initial  Findings:  labs showing:     DM  labs:  HbA1C: No results for input(s): HGBA1C in the last 8760 hours.     CBG (last 3)  Recent Labs    07/08/24 1255  GLUCAP 80          Cultures:    Component Value Date/Time   SDES BLOOD SITE NOT SPECIFIED 04/23/2024 2346   SPECREQUEST  04/23/2024 2346    BOTTLES DRAWN AEROBIC AND ANAEROBIC Blood Culture results may not be optimal due to an inadequate volume of blood received in culture bottles   CULT  04/23/2024 2346    NO GROWTH 5 DAYS Performed at [facility], [address] [place], [place] [address]    REPTSTATUS 04/29/2024 FINAL 04/23/2024 2346     Radiological Exams on Admission: CT Head Wo Contrast Result Date: 07/08/2024 EXAM: CT HEAD WITHOUT CONTRAST 07/08/2024 01:32:00 PM TECHNIQUE: CT of the head was performed without the administration of intravenous contrast. Automated exposure control, iterative reconstruction, and/or weight based adjustment of the mA/kV was utilized to reduce the radiation dose to as low as reasonably achievable. COMPARISON: CT head 04/24/2024. CLINICAL HISTORY: Head trauma, minor (Age >= 65y). No contrast; Pt bib [facility] coming from home with CC of weakness. Pt reportedly had a fall this morning and again earlier before EMS arrival. Pt fell on knees, with no loc/head injury. Pt is on eliquis . Pt denies pain. Pt alert and oriented x3, at baseline. Pt arrives ; in c-collar placed by EMS. FINDINGS: BRAIN AND VENTRICLES: No acute hemorrhage. No evidence of acute infarct. No hydrocephalus. No extra-axial collection. No mass effect or midline shift. Similar cerebral parenchymal volume loss. Nonspecific hypoattenuation in the periventricular and subcortical white matter, most likely representing chronic microvascular ischemic changes. Similar mineralization in the basal ganglia and cerebellum. ORBITS: Bilateral lens replacement. SINUSES: Moderate mucosal thickening in the  ethmoid sinuses. Additional mucosal thickening in the sphenoid sinuses, left greater than right. SOFT TISSUES AND SKULL: No acute soft tissue abnormality. No skull fracture. VASCULATURE: Atherosclerosis of the carotid siphons and the intracranial vertebral arteries. IMPRESSION: 1. No acute intracranial abnormality. Electronically signed by: Donnice Mania MD 07/08/2024 01:46 PM EDT RP Workstation: [id number]   _______________________________________________________________________________________________________ Latest  Blood pressure (!) 123/58, pulse 62, temperature 98.6 F (37 C), temperature  source Oral, resp. rate 20, SpO2 99%.   Vitals  labs and radiology finding personally reviewed  Review of Systems:    Pertinent positives include:  chills, fatigue ,Sore throat,  productive cough,  Constitutional:  No weight loss, night sweats, Fevers, , weight loss  HEENT:  No headaches, Difficulty swallowing,Tooth/dental problems No sneezing, itching, ear ache, nasal congestion, post nasal drip,  Cardio-vascular:  No chest pain, Orthopnea, PND, anasarca, dizziness, palpitations.no Bilateral lower extremity swelling  GI:  No heartburn, indigestion, abdominal pain, nausea, vomiting, diarrhea, change in bowel habits, loss of appetite, melena, blood in stool, hematemesis Resp:  no shortness of breath at rest. No dyspnea on exertion, No excess mucus, noNo non-productive cough, No coughing up of blood.No change in color of mucus.No wheezing. Skin:  no rash or lesions. No jaundice GU:  no dysuria, change in color of urine, no urgency or frequency. No straining to urinate.  No flank pain.  Musculoskeletal:  No joint pain or no joint swelling. No decreased range of motion. No back pain.  Psych:  No change in mood or affect. No depression or anxiety. No memory loss.  Neuro: no localizing neurological complaints, no tingling, no weakness, no double vision, no gait abnormality, no slurred speech, no  confusion  All systems reviewed and apart from HOPI all are negative _______________________________________________________________________________________________ Past Medical History:   Past Medical History:  Diagnosis Date   Angiodysplasia    Atrial fibrillation (HCC)    CHF (congestive heart failure) (HCC)    COPD (chronic obstructive pulmonary disease) (HCC)    GI bleed    Hypertension    Presence of permanent cardiac pacemaker      Past Surgical History:  Procedure Laterality Date   COLONOSCOPY     ICD GENERATOR CHANGEOUT N/A 07/20/2022   Procedure: ICD GENERATOR CHANGEOUT;  Surgeon: Inocencio Soyla Lunger, MD;  Location: [facility] INVASIVE CV LAB;  Service: Cardiovascular;  Laterality: N/A;   INGUINAL HERNIA REPAIR Right 12/14/2022   Procedure: OPEN RIGHT HERNIA REPAIR INGUINAL ADULT WITH MESH;  Surgeon: Rubin Calamity, MD;  Location: [facility] OR;  Service: General;  Laterality: Right;   PACEMAKER IMPLANT      Social History:  Ambulatory  cane,      reports that he quit smoking about 3 years ago. His smoking use included cigarettes. He started smoking about 43 years ago. He has a 40 pack-year smoking history. He has never used smokeless tobacco. He reports current alcohol use of about 1.0 standard drink of alcohol per week. He reports that he does not use drugs.   Family History:   Family History  Problem Relation Age of Onset   Healthy Mother    Breast cancer Father 37   Cancer Other    ______________________________________________________________________________________________ Allergies: No Known Allergies   Prior to Admission medications   Medication Sig Start Date End Date Taking? Authorizing Provider  albuterol  (VENTOLIN  HFA) 108 (90 Base) MCG/ACT inhaler INHALE 2 PUFFS BY MOUTH EVERY 6 HOURS AS NEEDED 12/21/23   Desai, Nikita S, MD  amiodarone  (PACERONE ) 200 MG tablet TAKE 1/2 (ONE-HALF) TABLET BY MOUTH ONCE DAILY . APPOINTMENT REQUIRED FOR FUTURE REFILLS 03/20/24    Camnitz, Soyla Lunger, MD  apixaban  (ELIQUIS ) 5 MG TABS tablet Take 1 tablet (5 mg total) by mouth 2 (two) times daily. 07/11/21   Camnitz, Soyla Lunger, MD  atorvastatin  (LIPITOR) 20 MG tablet Take 20 mg by mouth at bedtime.    [provider]  divalproex  (DEPAKOTE  ER) 500 MG 24  hr tablet Take 1,000 mg by mouth daily.  10/20/19   [provider]  doxycycline  (VIBRA -TABS) 100 MG tablet Take 1 tablet (100 mg total) by mouth 2 (two) times daily. 04/29/24   Singh, Prashant K, MD  finasteride  (PROSCAR ) 5 MG tablet Take 5 mg by mouth at bedtime. 07/15/20   [provider]  furosemide  (LASIX ) 20 MG tablet Take 1 tablet (20 mg total) by mouth daily as needed for edema. 10/03/23   Camnitz, Soyla Lunger, MD  metoprolol  succinate (TOPROL -XL) 50 MG 24 hr tablet Take 1 tablet (50 mg total) by mouth daily. Take with or immediately following a meal. Take along with 100 mg (Total 150 mg) 05/08/23   Camnitz, Soyla Lunger, MD  sodium chloride  (OCEAN) 0.65 % SOLN nasal spray Place 2 sprays into both nostrils every 3 (three) hours. 04/29/24   Dennise Lavada POUR, MD  tamsulosin  (FLOMAX ) 0.4 MG CAPS capsule Take 1 capsule (0.4 mg total) by mouth daily after supper. Patient taking differently: Take 0.8 mg by mouth daily after supper. 07/09/20   Bryn Bernardino NOVAK, MD  TRELEGY ELLIPTA  100-62.5-25 MCG/ACT AEPB INHALE 1 PUFF ONCE DAILY . APPOINTMENT REQUIRED FOR FUTURE REFILLS 05/05/24   Meade Verdon RAMAN, MD    ___________________________________________________________________________________________________ Physical Exam:    07/08/2024    7:00 PM 07/08/2024    6:15 PM 07/08/2024    6:00 PM  Vitals with BMI  Systolic 123 132 875  Diastolic 58 70 65  Pulse 62 66 72    1. General:  in No  Acute distress    Chronically ill   -appearing 2. Psychological: Alert and   Oriented 3. Head/ENT:    Dry Mucous Membranes                          Head Non traumatic, neck supple                          Poor  Dentition 4. SKIN: decreased Skin turgor,  Skin clean Dry and intact no rash    5. Heart: Regular rate and rhythm no  Murmur, no Rub or gallop 6. Lungs:  no wheezes or crackles   7. Abdomen: Soft,  non-tender, Non distended  bowel sounds present 8. Lower extremities: no clubbing, cyanosis, no  edema 9. Neurologically Grossly intact, moving all 4 extremities equally   10. MSK: Normal range of motion    Chart has been reviewed  ______________________________________________________________________________________________  Assessment/Plan  83 y.o. 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%, paroxysmal atrial fibrillation history of ventricular tachycardia, CKD 3B, BPH, anemia Hypertrophic cardiomyopathy, hypertension,   Admitted for COVID-19 infection and debility     Present on Admission:  COVID-19 virus infection  Benign prostatic hyperplasia with lower urinary tract symptoms  Bipolar 1 disorder (HCC)  Chronic systolic CHF (congestive heart failure) (HCC)  CKD (chronic kidney disease) stage 3, GFR 30-59 ml/min (HCC)  ICD (implantable cardioverter-defibrillator) in place  PAF (paroxysmal atrial fibrillation) (HCC)  Paroxysmal ventricular tachycardia (HCC)  Pure hypercholesterolemia  Debility  Anemia     Benign prostatic hyperplasia with lower urinary tract symptoms Continue Proscar  5 mg daily at bedtime and Flomax  0.4 mg daily  Bipolar 1 disorder (HCC) Chronic stable check Depakote  level will need to confirm with patient still taking  Chronic systolic CHF (congestive heart failure) (HCC) Continue home medications  including Lasix  20 mg  CKD (chronic kidney disease) stage 3, GFR 30-59 ml/min (HCC)  -chronic avoid nephrotoxic medications such as NSAIDs, Vanco Zosyn combo,  avoid hypotension, continue to follow renal function   History of complete heart block Status post AICD and  pacemaker  Hypertrophic cardiomyopathy (HCC) Chronic stable followed by cardiology  ICD (implantable cardioverter-defibrillator) in place Chronic stable  PAF (paroxysmal atrial fibrillation) (HCC) Continue amiodarone  Continue Eliquis  5 mg p.o. twice daily and restart metoprolol   Paroxysmal ventricular tachycardia (HCC) Restart metoprolol  at home doses and restart amiodarone   Pure hypercholesterolemia Continue Lipitor 20 mg daily  COVID-19 virus infection No infiltrate no hypoxia patient appears to be stable from respiratory standpoint illness has been ongoing for the past 3 to 4 days but did result in generalized fatigue and debility.  May benefit from PT OT prior to discharge.  And supportive management will observe overnight given extensive medical problems Hold off on antivirals for right now  Debility Patient has recent COVID infection will have PT OT evaluate prior to discharge debility likely in the setting of recent infection and underlying multiple medical problems  Anemia Obtain anemia panel  Transfuse for Hg <7 , rapidly dropping or  if symptomatic   Other plan as per orders.  DVT prophylaxis:  SCD      Code Status:    Code Status: Prior FULL CODE  as per patient   I had personally discussed CODE STATUS with patient  ACP    has been reviewed  patient now changed his mind   Family Communication:   Family not at  Bedside    Diet  Diet Orders (From admission, onward)     Start     Ordered   07/08/24 1134  Diet NPO time specified  Diet effective now        07/08/24 1134            Disposition Plan:      To home once workup is complete and patient is stable   Following barriers for discharge:                                                         Electrolytes corrected                                                          Will need to be able to tolerate PO                                   Consult Orders  (From admission, onward)            Start     Ordered   07/09/24 2247  Nutritional services consult  Once       Provider:  (Not yet assigned)  Question:  Reason for Consult?  Answer:  assess nutrtional status   07/08/24 2250   07/08/24 1655  Consult to hospitalist  Pg sent by deloris 16:56  Once       Provider:  (Not yet assigned)  Question Answer Comment  Place call to: [facility]   Reason for Consult Admit      07/08/24 1654                               Would benefit from PT/OT eval prior to DC  Ordered                    Consults called:    NONE   Admission status:  ED Disposition     ED Disposition  Admit   Condition  --   Comment  Hospital Area: [facility] [[id number]]  Level of Care: Telemetry Medical [[id number]]  Covid Evaluation: Confirmed COVID Positive  Diagnosis: COVID-19 virus infection [[id number]]  Admitting Physician: Shamera Yarberry [[id number]]  Attending Physician: Kashawn Manzano [[id number]]  For patients discharging to extended facilities (i.e. SNF, AL, group homes or LTAC) initiate:: Discharge to SNF/Facility Placement COVID-19 Lab Testing Protocol          Obs     Level of care     tele indefinitely please discontinue once patient no longer qualifies COVID-19 Labs   Javia Dillow 07/08/2024, 10:57 PM    [facility]     after 2 AM please page floor coverage   If 7AM-7PM, please contact the day team taking care of the patient using [email or web address]

## 2024-07-08 NOTE — Assessment & Plan Note (Signed)
-  chronic avoid nephrotoxic medications such as NSAIDs, Vanco Zosyn combo,  avoid hypotension, continue to follow renal function

## 2024-07-08 NOTE — Assessment & Plan Note (Signed)
 Restart metoprolol  at home doses and restart amiodarone 

## 2024-07-08 NOTE — ED Notes (Addendum)
 Pt ambulated with assistance with walker in hallway. Pt initially tolerated well but became shaky and weak with labored breathing when returning back to room

## 2024-07-08 NOTE — Assessment & Plan Note (Signed)
 Patient has recent COVID infection will have PT OT evaluate prior to discharge debility likely in the setting of recent infection and underlying multiple medical problems

## 2024-07-08 NOTE — Assessment & Plan Note (Signed)
 Status post AICD and pacemaker

## 2024-07-08 NOTE — Assessment & Plan Note (Signed)
 Chronic stable

## 2024-07-08 NOTE — Assessment & Plan Note (Signed)
 Continue home medications including Lasix  20 mg

## 2024-07-08 NOTE — Assessment & Plan Note (Signed)
 Chronic stable check Depakote  level will need to confirm with patient still taking

## 2024-07-08 NOTE — ED Provider Notes (Signed)
 [place] EMERGENCY DEPARTMENT AT [facility] Provider Note   CSN: [id number] Arrival date & time: 07/08/24  1126     Patient presents with: Weakness   Mike Gray is a 83 y.o. male.   83 year old male with past medical history of atrial fibrillation on Eliquis , CHF, COPD, and hypertension presenting to the emergency department today after a fall at home.  The patient states that he lost his footing and fell down to his knees.  He states that he was feeling generally weak and could not get up.  He was brought to the ER at that time for further evaluation.  The patient reports that he has been in his normal state of health recently.  He states that he does walk with a cane and does have some generalized weakness at baseline but this does not seem much different than his baseline.  He did not hit his head or lose consciousness when he fell.  Denies any neck pain or back pain.   Weakness      Prior to Admission medications   Medication Sig Start Date End Date Taking? Authorizing Provider  albuterol  (VENTOLIN  HFA) 108 (90 Base) MCG/ACT inhaler INHALE 2 PUFFS BY MOUTH EVERY 6 HOURS AS NEEDED 12/21/23   Desai, Nikita S, MD  amiodarone  (PACERONE ) 200 MG tablet TAKE 1/2 (ONE-HALF) TABLET BY MOUTH ONCE DAILY . APPOINTMENT REQUIRED FOR FUTURE REFILLS 03/20/24   Camnitz, Soyla Lunger, MD  apixaban  (ELIQUIS ) 5 MG TABS tablet Take 1 tablet (5 mg total) by mouth 2 (two) times daily. 07/11/21   Camnitz, Soyla Lunger, MD  atorvastatin  (LIPITOR) 20 MG tablet Take 20 mg by mouth at bedtime.    [provider]  divalproex  (DEPAKOTE  ER) 500 MG 24 hr tablet Take 1,000 mg by mouth daily.  10/20/19   [provider]  doxycycline  (VIBRA -TABS) 100 MG tablet Take 1 tablet (100 mg total) by mouth 2 (two) times daily. 04/29/24   Dennise Lavada POUR, MD  finasteride  (PROSCAR ) 5 MG tablet Take 5 mg by mouth at bedtime. 07/15/20   [provider]  furosemide  (LASIX ) 20 MG tablet Take 1  tablet (20 mg total) by mouth daily as needed for edema. 10/03/23   Camnitz, Soyla Lunger, MD  metoprolol  succinate (TOPROL -XL) 50 MG 24 hr tablet Take 1 tablet (50 mg total) by mouth daily. Take with or immediately following a meal. Take along with 100 mg (Total 150 mg) 05/08/23   Camnitz, Soyla Lunger, MD  sodium chloride  (OCEAN) 0.65 % SOLN nasal spray Place 2 sprays into both nostrils every 3 (three) hours. 04/29/24   Dennise Lavada POUR, MD  tamsulosin  (FLOMAX ) 0.4 MG CAPS capsule Take 1 capsule (0.4 mg total) by mouth daily after supper. Patient taking differently: Take 0.8 mg by mouth daily after supper. 07/09/20   Bryn Bernardino NOVAK, MD  TRELEGY ELLIPTA  100-62.5-25 MCG/ACT AEPB INHALE 1 PUFF ONCE DAILY . APPOINTMENT REQUIRED FOR FUTURE REFILLS 05/05/24   Desai, Nikita S, MD    Allergies: Patient has no known allergies.    Review of Systems  Neurological:  Positive for weakness.  All other systems reviewed and are negative.   Updated Vital Signs BP 110/64   Pulse 73   Temp 99.1 F (37.3 C) (Oral)   Resp 15   SpO2 95%   Physical Exam Vitals and nursing note reviewed.   Gen: NAD Eyes: PERRL, EOMI HEENT: no oropharyngeal swelling Neck: trachea midline Resp: clear to auscultation bilaterally Card: RRR, no murmurs,  rubs, or gallops Abd: nontender, nondistended Extremities: no calf tenderness, no edema Vascular: 2+ radial pulses bilaterally, 2+ DP pulses bilaterally Neuro: Cranial nerves intact, equal strength and sensation throughout bilateral upper and lower extremities Skin: no rashes Psyc: acting appropriately   (all labs ordered are listed, but only abnormal results are displayed) Labs Reviewed  RESP PANEL BY RT-PCR (RSV, FLU A&B, COVID)  RVPGX2 - Abnormal; Notable for the following components:      Result Value   SARS Coronavirus 2 by RT PCR POSITIVE (*)    All other components within normal limits  COMPREHENSIVE METABOLIC PANEL WITH GFR - Abnormal; Notable for the following  components:   CO2 21 (*)    BUN 25 (*)    Creatinine, Ser 1.38 (*)    Total Protein 6.1 (*)    Albumin 3.0 (*)    GFR, Estimated 51 (*)    All other components within normal limits  CBC - Abnormal; Notable for the following components:   Hemoglobin 12.3 (*)    HCT 38.5 (*)    RDW 16.2 (*)    Platelets 98 (*)    All other components within normal limits  URINALYSIS, ROUTINE W REFLEX MICROSCOPIC  CBG MONITORING, ED    EKG: EKG Interpretation Date/Time:  Tuesday July 08 2024 12:40:11 EDT Ventricular Rate:  74 PR Interval:  58 QRS Duration:  227 QT Interval:  539 QTC Calculation: 599 R Axis:   -87  Text Interpretation: Sinus rhythm Short PR interval Consider right atrial enlargement Right bundle branch block LVH with IVCD and secondary repol abnrm Prolonged QT interval Confirmed by Ula Barter [phone]) on 07/08/2024 12:58:34 PM  Radiology: CT Head Wo Contrast Result Date: 07/08/2024 EXAM: CT HEAD WITHOUT CONTRAST 07/08/2024 01:32:00 PM TECHNIQUE: CT of the head was performed without the administration of intravenous contrast. Automated exposure control, iterative reconstruction, and/or weight based adjustment of the mA/kV was utilized to reduce the radiation dose to as low as reasonably achievable. COMPARISON: CT head 04/24/2024. CLINICAL HISTORY: Head trauma, minor (Age >= 65y). No contrast; Pt bib [facility] coming from home with CC of weakness. Pt reportedly had a fall this morning and again earlier before EMS arrival. Pt fell on knees, with no loc/head injury. Pt is on eliquis . Pt denies pain. Pt alert and oriented x3, at baseline. Pt arrives ; in c-collar placed by EMS. FINDINGS: BRAIN AND VENTRICLES: No acute hemorrhage. No evidence of acute infarct. No hydrocephalus. No extra-axial collection. No mass effect or midline shift. Similar cerebral parenchymal volume loss. Nonspecific hypoattenuation in the periventricular and subcortical white matter, most likely representing chronic  microvascular ischemic changes. Similar mineralization in the basal ganglia and cerebellum. ORBITS: Bilateral lens replacement. SINUSES: Moderate mucosal thickening in the ethmoid sinuses. Additional mucosal thickening in the sphenoid sinuses, left greater than right. SOFT TISSUES AND SKULL: No acute soft tissue abnormality. No skull fracture. VASCULATURE: Atherosclerosis of the carotid siphons and the intracranial vertebral arteries. IMPRESSION: 1. No acute intracranial abnormality. Electronically signed by: Donnice Mania MD 07/08/2024 01:46 PM EDT RP Workstation: [id number]     Procedures   Medications Ordered in the ED - No data to display                                  Medical Decision Making 83 year old male with past medical history of atrial fibrillation, CHF, COPD, and hypertension presenting to the emergency department today after a fall  at home.  The patient does not appear to have any acute traumatic injuries.  With him being on Eliquis  will obtain a CT scan of his head to eval for intracranial hemorrhage.  Will also obtain basic labs here to evaluate for anemia although the patient is denying any blood in the stool or dark stools.  This will also evaluate for electrolyte abnormalities.  Will obtain a urinalysis here as well is an RSV/COVID/flu swab to evaluate for viral etiologies that could be contributing to generalized weakness.  I think if his workup is reassuring that he could potentially be discharged.  He does not have any focal deficits or neck pain.  Cervical spine is cleared using Nexus criteria.  The patient's labs are reassuring with exception of his COVID test which is positive.  We will attempt to ambulate the patient here and see how he does.  If he is still very weak he may require admission.  If he is back to his baseline he could potentially be discharged.    Amount and/or Complexity of Data Reviewed Labs: ordered. Radiology: ordered.  Risk Decision regarding  hospitalization.        Final diagnoses:  Symptomatic bradycardia    ED Discharge Orders     None          Ula Prentice SAUNDERS, MD 07/08/24 1513

## 2024-07-09 DIAGNOSIS — N1831 Chronic kidney disease, stage 3a: Secondary | ICD-10-CM

## 2024-07-09 DIAGNOSIS — N401 Enlarged prostate with lower urinary tract symptoms: Secondary | ICD-10-CM | POA: Diagnosis not present

## 2024-07-09 DIAGNOSIS — U071 COVID-19: Secondary | ICD-10-CM | POA: Diagnosis not present

## 2024-07-09 DIAGNOSIS — I480 Paroxysmal atrial fibrillation: Secondary | ICD-10-CM | POA: Diagnosis not present

## 2024-07-09 DIAGNOSIS — R7989 Other specified abnormal findings of blood chemistry: Secondary | ICD-10-CM

## 2024-07-09 DIAGNOSIS — I472 Ventricular tachycardia, unspecified: Secondary | ICD-10-CM | POA: Diagnosis not present

## 2024-07-09 LAB — VALPROIC ACID LEVEL: Valproic Acid Lvl: 96 ug/mL (ref 50–100)

## 2024-07-09 LAB — COMPREHENSIVE METABOLIC PANEL WITH GFR
ALT: 16 U/L (ref 0–44)
AST: 26 U/L (ref 15–41)
Albumin: 3.3 g/dL — ABNORMAL LOW (ref 3.5–5.0)
Alkaline Phosphatase: 47 U/L (ref 38–126)
Anion gap: 10 (ref 5–15)
BUN: 21 mg/dL (ref 8–23)
CO2: 24 mmol/L (ref 22–32)
Calcium: 9.5 mg/dL (ref 8.9–10.3)
Chloride: 103 mmol/L (ref 98–111)
Creatinine, Ser: 1.27 mg/dL — ABNORMAL HIGH (ref 0.61–1.24)
GFR, Estimated: 56 mL/min — ABNORMAL LOW (ref >60)
Glucose, Bld: 94 mg/dL (ref 70–99)
Potassium: 4.1 mmol/L (ref 3.5–5.1)
Sodium: 137 mmol/L (ref 135–145)
Total Bilirubin: 1.1 mg/dL (ref 0.0–1.2)
Total Protein: 6.6 g/dL (ref 6.5–8.1)

## 2024-07-09 LAB — CBC
HCT: 38.5 % — ABNORMAL LOW (ref 39.0–52.0)
Hemoglobin: 12.8 g/dL — ABNORMAL LOW (ref 13.0–17.0)
MCH: 29.2 pg (ref 26.0–34.0)
MCHC: 33.2 g/dL (ref 30.0–36.0)
MCV: 87.9 fL (ref 80.0–100.0)
Platelets: 120 K/uL — ABNORMAL LOW (ref 150–400)
RBC: 4.38 MIL/uL (ref 4.22–5.81)
RDW: 16.1 % — ABNORMAL HIGH (ref 11.5–15.5)
WBC: 4.2 K/uL (ref 4.0–10.5)
nRBC: 0 % (ref 0.0–0.2)

## 2024-07-09 LAB — IRON AND TIBC
Iron: 16 ug/dL — ABNORMAL LOW (ref 45–182)
Saturation Ratios: 5 % — ABNORMAL LOW (ref 17.9–39.5)
TIBC: 323 ug/dL (ref 250–450)
UIBC: 307 ug/dL

## 2024-07-09 LAB — PHOSPHORUS: Phosphorus: 2.8 mg/dL (ref 2.5–4.6)

## 2024-07-09 LAB — LACTIC ACID, PLASMA: Lactic Acid, Venous: 1 mmol/L (ref 0.5–1.9)

## 2024-07-09 LAB — RETICULOCYTES
Immature Retic Fract: 4.5 % (ref 2.3–15.9)
RBC.: 4.39 MIL/uL (ref 4.22–5.81)
Retic Count, Absolute: 41.7 K/uL (ref 19.0–186.0)
Retic Ct Pct: 1 % (ref 0.4–3.1)

## 2024-07-09 LAB — FERRITIN: Ferritin: 104 ng/mL (ref 24–336)

## 2024-07-09 LAB — TSH: TSH: <0.1 u[IU]/mL — ABNORMAL LOW (ref 0.350–4.500)

## 2024-07-09 LAB — CK: Total CK: 58 U/L (ref 49–397)

## 2024-07-09 LAB — MAGNESIUM: Magnesium: 1.6 mg/dL — ABNORMAL LOW (ref 1.7–2.4)

## 2024-07-09 LAB — VITAMIN B12: Vitamin B-12: 582 pg/mL (ref 180–914)

## 2024-07-09 LAB — FOLATE: Folate: >20 ng/mL (ref >5.9)

## 2024-07-09 LAB — GLUCOSE, CAPILLARY: Glucose-Capillary: 102 mg/dL — ABNORMAL HIGH (ref 70–99)

## 2024-07-09 LAB — T4, FREE: Free T4: 1.81 ng/dL — ABNORMAL HIGH (ref 0.61–1.12)

## 2024-07-09 LAB — PROCALCITONIN: Procalcitonin: <0.1 ng/mL

## 2024-07-09 MED ORDER — MAGNESIUM SULFATE 2 GM/50ML IV SOLN
2.0000 g | Freq: Once | INTRAVENOUS | Status: AC
  Administered 2024-07-09: 2 g via INTRAVENOUS
  Filled 2024-07-09: qty 50

## 2024-07-09 MED ORDER — ENSURE PLUS HIGH PROTEIN PO LIQD
237.0000 mL | Freq: Two times a day (BID) | ORAL | Status: DC
  Administered 2024-07-10 – 2024-07-12 (×6): 237 mL via ORAL

## 2024-07-09 MED ORDER — IPRATROPIUM-ALBUTEROL 0.5-2.5 (3) MG/3ML IN SOLN: 3.0000 mL | RESPIRATORY_TRACT | Status: DC | PRN

## 2024-07-09 MED ORDER — DIVALPROEX SODIUM ER 500 MG PO TB24
500.0000 mg | ORAL_TABLET | Freq: Two times a day (BID) | ORAL | Status: DC
  Administered 2024-07-09 – 2024-07-12 (×6): 500 mg via ORAL
  Filled 2024-07-09 (×7): qty 1

## 2024-07-09 NOTE — Evaluation (Signed)
 Occupational Therapy Evaluation Patient Details Name: Mike Gray MRN: [id number] DOB: 1941-03-05 Today's Date: 07/09/2024   History of Present Illness   Pt is an 83 y/o M admitted on 07/08/24 after presenting with c/o worsening debility & fatigue in the setting of recent COVID infection. PMH: COPD, complete AV block s/p dual-chamber ICD, chronic systolic CHF, a-fib, v-tach, CKD 3B, BPH, anemia, hypertrophic cardiomyopathy, HTN     Clinical Impressions Pt presents with decline in function and safety with ADLs and ADL mobility with impaired strength, balance, endurance and cognition. PLOF/home set up info obtained from previous chart; pt with poor cognition, delayed response time to questions. Pt reports that he lives with brother and that he was Ind with ADLs, uses a cane, reports that he drives and cooks. Unsure of pt's baseline cognition as pt is able to state that he is in the hospital but cannot recall why; pt later stated  I am in [facility] hospital cause I keep falling down at home. Pt currently requires max A with LB ADLs, mod A with toileting and mod/min A with transfers/mobility using RW. OT will follolw acutely to maximize level of function and safety    If plan is discharge home, recommend the following:   A lot of help with bathing/dressing/bathroom;A little help with walking and/or transfers;Assistance with cooking/housework;Supervision due to cognitive status;Assist for transportation;Help with stairs or ramp for entrance     Functional Status Assessment   Patient has had a recent decline in their functional status and demonstrates the ability to make significant improvements in function in a reasonable and predictable amount of time.     Equipment Recommendations   Other (comment) (defer)     Recommendations for Other Services         Precautions/Restrictions   Precautions Precautions: Fall Restrictions Weight Bearing Restrictions Per Provider Order:  No     Mobility Bed Mobility Overal bed mobility: Needs Assistance Bed Mobility: Supine to Sit, Sit to Supine     Supine to sit: Mod assist, HOB elevated, Used rails Sit to supine: Mod assist   General bed mobility comments: mod A to elevate trunk and with LE mgt    Transfers Overall transfer level: Needs assistance Equipment used: Rolling walker (2 wheels) Transfers: Sit to/from Stand, Bed to chair/wheelchair/BSC Sit to Stand: Mod assist, Min assist     Step pivot transfers: Mod assist, Min assist            Balance Overall balance assessment: Needs assistance, History of Falls Sitting-balance support: Feet supported, Bilateral upper extremity supported Sitting balance-Leahy Scale: Fair     Standing balance support: During functional activity, Bilateral upper extremity supported, Reliant on assistive device for balance Standing balance-Leahy Scale: Poor                             ADL either performed or assessed with clinical judgement   ADL Overall ADL's : Needs assistance/impaired Eating/Feeding: Set up;Independent;Sitting   Grooming: Wash/dry hands;Wash/dry face;Minimal assistance;Standing   Upper Body Bathing: Contact guard assist;Sitting   Lower Body Bathing: Maximal assistance   Upper Body Dressing : Contact guard assist;Sitting   Lower Body Dressing: Maximal assistance   Toilet Transfer: Moderate assistance;Ambulation;Rolling walker (2 wheels);Cueing for safety;Regular Toilet;Grab bars   Toileting- Clothing Manipulation and Hygiene: Moderate assistance;Sit to/from stand       Functional mobility during ADLs: Minimal assistance;Rolling walker (2 wheels);Cueing for safety       Vision  Ability to See in Adequate Light: 0 Adequate Patient Visual Report: No change from baseline       Perception         Praxis         Pertinent Vitals/Pain Pain Assessment Pain Assessment: No/denies pain Faces Pain Scale: Hurts a little  bit Pain Location: generalized Pain Descriptors / Indicators: Grimacing Pain Intervention(s): Monitored during session, Repositioned     Extremity/Trunk Assessment Upper Extremity Assessment Upper Extremity Assessment: Generalized weakness;Right hand dominant   Lower Extremity Assessment Lower Extremity Assessment: Defer to PT evaluation   Cervical / Trunk Assessment Cervical / Trunk Assessment: Kyphotic   Communication Communication Factors Affecting Communication:  (question if pt is hearing impaired)   Cognition Arousal: Alert Behavior During Therapy: Flat affect                                 Following commands: Impaired Following commands impaired: Follows one step commands with increased time, Follows one step commands inconsistently     Cueing  General Comments   Cueing Techniques: Verbal cues;Tactile cues;Visual cues  SpO2 93% sitting in recliner after transfer on room air, HR 100 bpm   Exercises     Shoulder Instructions      Home Living Family/patient expects to be discharged to:: Private residence Living Arrangements: Other relatives (brother) Available Help at Discharge: Family Type of Home: House Home Access: Stairs to enter Secretary/administrator of Steps: 4 Entrance Stairs-Rails: None Home Layout: Multi-level;Bed/bath upstairs;Able to live on main level with bedroom/bathroom     Bathroom Shower/Tub: Tub/shower unit;Walk-in shower   Bathroom Toilet: Handicapped height     Home Equipment: Cane - single point;Grab bars - tub/shower   Additional Comments: Above information taken from previous chart entry; pt with poor cognition, delayed response to questions. Pt does confirm he lives with brother. Pt presents with poor cognition, increased time processing, question if pt is HOH.      Prior Functioning/Environment Prior Level of Function : Independent/Modified Independent             Mobility Comments: pt reports that he uses  a cane sometimes ADLs Comments: Pt reports Ind with ADLs    OT Problem List: Decreased activity tolerance;Decreased knowledge of use of DME or AE;Decreased coordination;Decreased safety awareness;Impaired balance (sitting and/or standing);Decreased cognition   OT Treatment/Interventions: Self-care/ADL training;Therapeutic exercise;Patient/family education;Balance training;Therapeutic activities;DME and/or AE instruction      OT Goals(Current goals can be found in the care plan section)   Acute Rehab OT Goals Patient Stated Goal: go home OT Goal Formulation: With patient Time For Goal Achievement: 07/23/24 Potential to Achieve Goals: Good ADL Goals Pt Will Perform Grooming: with contact guard assist;with supervision;standing Pt Will Perform Lower Body Bathing: with mod assist;with min assist;sitting/lateral leans;sit to/from stand Pt Will Perform Lower Body Dressing: with mod assist;with min assist;sitting/lateral leans;sit to/from stand Pt Will Transfer to Toilet: with min assist;ambulating;regular height toilet;grab bars Pt Will Perform Toileting - Clothing Manipulation and hygiene: with min assist;sitting/lateral leans;sit to/from stand   OT Frequency:  Min 2X/week    Co-evaluation              AM-PAC OT 6 Clicks Daily Activity     Outcome Measure Help from another person eating meals?: None Help from another person taking care of personal grooming?: A Little Help from another person toileting, which includes using toliet, bedpan, or urinal?: A Lot Help from another  person bathing (including washing, rinsing, drying)?: A Lot Help from another person to put on and taking off regular upper body clothing?: A Little Help from another person to put on and taking off regular lower body clothing?: A Lot 6 Click Score: 16   End of Session Equipment Utilized During Treatment: Gait belt;Rolling walker (2 wheels) Nurse Communication: Mobility status  Activity Tolerance:  Patient tolerated treatment well Patient left: in bed;with call bell/phone within reach;with bed alarm set  OT Visit Diagnosis: Unsteadiness on feet (R26.81);Other abnormalities of gait and mobility (R26.89);History of falling (Z91.81);Muscle weakness (generalized) (M62.81)                Time: 8785-8761 OT Time Calculation (min): 24 min Charges:  OT General Charges $OT Visit: 1 Visit OT Evaluation $OT Eval Moderate Complexity: 1 Mod OT Treatments $Therapeutic Activity: 8-22 mins    Jacques Karna Loose 07/09/2024, 1:22 PM

## 2024-07-09 NOTE — Plan of Care (Signed)
 Updated patient's brother over the phone.  He states that they had COVID and flu vaccine at the pharmacy on Saturday. He was on the floor Monday.  He called 911.  They got him off the floor but did not feel he needed to come to the ED.  However, patient continued to feel weak and unable to walk.  Normally walks with a walker.  He was recently at rehab discharge home with home health PT and OT.  Brother also noted increased tremor over the last 1 to 2 years.  Tremor is worse with activity.  Also mild cognitive impairment but no formal diagnosis of dementia.  He is concerned about ability to care for him at home.  He says he talked to rehab people at [place]. He was told that patient has to be in the hospital for 3 days before going to rehab.

## 2024-07-09 NOTE — Care Management Obs Status (Signed)
 MEDICARE OBSERVATION STATUS NOTIFICATION   Patient Details  Name: Mike Gray MRN: [id number] Date of Birth: [birth year]   Medicare Observation Status Notification Given:  Yes    Tom-Johnson, Harvest [place]rat, RN 07/09/2024, 2:53 PM

## 2024-07-09 NOTE — TOC Progression Note (Addendum)
 tTransition of Care [facility]) - Progression Note    Patient Details  Name: Mike Gray MRN: [id number] Date of Birth: 24-Nov-1940  Transition of Care [facility]) CM/SW Contact  Lendia Dais, [place] Phone Number: 07/09/2024, 1:17 PM  Clinical Narrative: Patient is disoriented x2.  CSW spoke to brother Lamar via phone Patient is from home with brother and uses a walker, cane, oxo meter, and wc at baseline. Patient has family supports of Lamar and granddaughter Vernell.  Patient does not drive and is independent with ADL's. Lamar stated he does watch the patient take their medicine due to not take it correctly in the past. Lamar stated that he is the [facility]. CSW requested paperwork to be brought to the hospital and the patient was agreeable. Patient has no SDOH concerns and is able to afford their medicine.  Lamar mentioned that they take Depakote  for Bipolar disorder and have a hx of mental health. Pt does not see an outpatient provider for psych and Lamar denied [facility] resources.   Pt does not have a PCP but Lamar reported that they have a NP that comes out to their home or through telehealth from [facility].  Lamar was agreeable to pt dc'ing to a SNF and has previously went to [facility] in July 2025 for about a month and has a preference of [address] CSW mentioned that SNF days may have been used for now and their may be a out of pocket cost.   CSW sent referrals in the HUB.    Expected Discharge Plan: Skilled Nursing Facility Barriers to Discharge: Continued Medical Work up, SNF Pending bed offer               Expected Discharge Plan and Services In-house Referral: Clinical Social Work     Living arrangements for the past 2 months: Single Family Home                                       Social Drivers of Health (SDOH) Interventions SDOH Screenings   Food Insecurity: No Food Insecurity (07/09/2024)  Housing: Low Risk  (07/09/2024)  Transportation  Needs: No Transportation Needs (07/09/2024)  Utilities: Not At Risk (07/09/2024)  Depression (PHQ2-9): Low Risk  (07/18/2022)  Social Connections: Socially Isolated (07/09/2024)  Tobacco Use: Medium Risk (07/08/2024)    Readmission Risk Interventions     No data to display

## 2024-07-09 NOTE — Progress Notes (Signed)
 Initial Nutrition Assessment  DOCUMENTATION CODES:      INTERVENTION:  Continue Heart healthy diet as tolerated Ensure Plus High Protein po BID, each supplement provides 350 kcal and 20 grams of protein.   NUTRITION DIAGNOSIS:   Unintentional weight loss related to acute illness as evidenced by other (comment) (-12.6% weight loss x 6 months).   GOAL:   Patient will meet greater than or equal to 90% of their needs   MONITOR:   PO intake, Supplement acceptance  REASON FOR ASSESSMENT:   Consult Assessment of nutrition requirement/status  ASSESSMENT:   Patient seen in room sleeping. Patient woke up briefly to answer a few questions but unable to stay awake and fell back asleep. Patient unsure if he had lunch or breakfast. Noted to be A&O x2 this morning. Bedscale weight taken during visit and updated in chart. -12.6% weight loss x 6 months per chart history, clinically significant if accurate.  Meds: B1 100 mg IV daily   Labs: Cr 1.27 Mg 1.6  GFR 56   NUTRITION - FOCUSED PHYSICAL EXAM:  Unable to assess, patient too tired to stay awake.   Diet Order:   Diet Order             Diet Heart Room service appropriate? Yes; Fluid consistency: Thin  Diet effective now                   EDUCATION NEEDS:   No education needs have been identified at this time  Skin:  Skin Assessment: Reviewed RN Assessment  Last BM:  PTA  Height:   Ht Readings from Last 1 Encounters:  04/23/24 5' 8 (1.727 m)    Weight:   Wt Readings from Last 1 Encounters:  07/09/24 70.5 kg    Ideal Body Weight:  70 kg  BMI:  Body mass index is 23.63 kg/m.  Estimated Nutritional Needs:   Kcal:  8236-7884 kcals  Protein:  71-85 grams  Fluid:  1.7L    Labrisha Wuellner, MS, RD, LDN Clinical Dietitian  Please see AMiON for contact information.

## 2024-07-09 NOTE — Progress Notes (Signed)
 PROGRESS NOTE  Mike Gray FMW:[id number] DOB: 1941/08/21   PCP: No primary care provider on file.  Patient is from: Home.  Lives with friend.  Uses rolling walker.  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  In ED, slightly elevated BP on arrival but improved quickly.  No oxygen requirement.  CMP and CBC without significant finding other than thrombocytopenia.  Pro-Cal and lactic acid normal.  COVID-19 PCR positive.  UA unrevealing.  CT head without acute finding.  CXR without infiltrate.  Admitted for physical deconditioning the setting of COVID infection. Further workup with undetectable TSH.   Subjective: Seen and examined earlier this afternoon.  Patient was sleepy but wakes to voice.  No complaints but not a great historian.  Intermittently coughing.  No respiratory distress.  He is oriented to self and place but not time.  Objective: Vitals:   07/08/24 2335 07/09/24 0443 07/09/24 0743 07/09/24 1300  BP: (!) 150/93 129/70 (!) 146/79   Pulse: 93 79 82   Resp: 18 18 19  (!) 28  Temp: 98.6 F (37 C) 98.1 F (36.7 C) 98.4 F (36.9 C)   TempSrc: Oral Oral    SpO2: 95% 95% 93%   Weight:    70.5 kg    Examination:  GENERAL: No apparent distress.  Nontoxic. HEENT: MMM.  Vision and hearing grossly intact.  NECK: Supple.  No apparent JVD.  RESP:  No IWOB.  Fair aeration bilaterally.  Rhonchi bilaterally.  Intermittent cough. CVS:  RRR. Heart sounds normal.  ABD/GI/GU: BS+. Abd soft, NTND.  MSK/EXT:  Moves extremities. No apparent deformity. No edema.  SKIN: no apparent skin lesion or wound NEURO: AA.  Oriented appropriately.  No  apparent focal neuro deficit. PSYCH: Calm. Normal affect.   Consultants:  None  Procedures: None  Microbiology summarized: COVID-19 PCR positive Influenza and RSV PCR nonreactive  Assessment and plan: COVID-19 infection: Reportedly had DOE.  Some rhonchi and cough on exam.  No respiratory distress or oxygen requirement.  No infiltrate on chest x-ray.  Could be contributing to patient's weakness and fatigue. - Supportive care - Isolation precaution  Generalized weakness/physical deconditioning: Could be due to COVID-19 infection.  TSH is also undetectable.  Patient is not on Synthroid.  He is on amiodarone  for A-fib. - Check free T4 - PT/OT eval - Supportive care for COVID-19 as above  Chronic HFrEF/history of VT/HCM s/p dual-chamber AICD.  Appears euvolemic on exam.  On p.o. Lasix  20 mg daily at home. - Hold home diuretics - Monitor fluid and respiratory status.  Paroxysmal A-fib/CHB s/p pacemaker.  On amiodarone , metoprolol  and Eliquis  at home. - Continue home meds  Chronic COPD: Without exacerbation. - Continue home Breztri  - Add DuoNebs as needed  CKD-3A: Stable - Continue monitoring  BPH - Continue home Proscar  and Flomax    Bipolar 1 disorder: Depakote  therapeutic. -Resume home Depakote   Pure hypercholesterolemia -Continue Lipitor 20 mg daily   Thrombocytopenia: -Monitor  Cognitive impairment?  He is awake but only oriented to self and place.  No formal diagnosis. - Reorientation and delirium precaution  Body mass index is 23.63 kg/m.           DVT prophylaxis:  SCDs  Start: 07/08/24 2336 apixaban  (ELIQUIS ) tablet 5 mg  Code Status: Full code Family Communication: None at bedside Level of care: Telemetry Medical Status is: Observation The patient will require care spanning > 2 midnights and should be moved to inpatient because: COVID-19 infection, physical deconditioning   Final disposition: SNF   55 minutes with more than 50% spent in  reviewing records, counseling patient/family and coordinating care.   Sch Meds:  Scheduled Meds:  amiodarone   100 mg Oral Daily   apixaban   5 mg Oral BID   atorvastatin   20 mg Oral QHS   budesonide -glycopyrrolate -formoterol   2 puff Inhalation BID   divalproex   500 mg Oral BID   finasteride   5 mg Oral QHS   sodium chloride  flush  3 mL Intravenous Q12H   tamsulosin   0.8 mg Oral QPC supper   thiamine  (VITAMIN B1) injection  100 mg Intravenous Daily   Continuous Infusions:  sodium chloride      PRN Meds:.sodium chloride , acetaminophen  **OR** acetaminophen , HYDROcodone -acetaminophen , ipratropium-albuterol , ondansetron  **OR** ondansetron  (ZOFRAN ) IV, sodium chloride  flush  Antimicrobials: Anti-infectives (From admission, onward)    None        I have personally reviewed the following labs and images: CBC: Recent Labs  Lab 07/08/24 1255 07/09/24 0033  WBC 4.7 4.2  HGB 12.3* 12.8*  HCT 38.5* 38.5*  MCV 91.0 87.9  PLT 98* 120*   BMP &GFR Recent Labs  Lab 07/08/24 1255 07/09/24 0033  NA 136 137  K 3.9 4.1  CL 106 103  CO2 21* 24  GLUCOSE 87 94  BUN 25* 21  CREATININE 1.38* 1.27*  CALCIUM  9.4 9.5  MG  --  1.6*  PHOS  --  2.8   Estimated Creatinine Clearance: 43.4 mL/min (A) (by C-G formula based on SCr of 1.27 mg/dL (H)). Liver & Pancreas: Recent Labs  Lab 07/08/24 1255 07/09/24 0033  AST 25 26  ALT 16 16  ALKPHOS 44 47  BILITOT 0.4 1.1  PROT 6.1* 6.6  ALBUMIN 3.0* 3.3*   No results for input(s): LIPASE, AMYLASE in the last 168 hours. No results for input(s): AMMONIA in the last 168 hours. Diabetic: No results for input(s): HGBA1C in the last 72 hours. Recent Labs  Lab 07/08/24 1255 07/09/24 0743  GLUCAP 80 102*   Cardiac Enzymes: Recent Labs  Lab 07/09/24 0032  CKTOTAL 58   No results for input(s): PROBNP in the last 8760 hours. Coagulation Profile: No results for input(s): INR, PROTIME in the last 168 hours. Thyroid  Function  Tests: Recent Labs    07/09/24 0032  TSH <0.100*   Lipid Profile: No results for input(s): CHOL, HDL, LDLCALC, TRIG, CHOLHDL, LDLDIRECT in the last 72 hours. Anemia Panel: Recent Labs    07/09/24 0032 07/09/24 0033  VITAMINB12  --  582  FOLATE  --  >20.0  FERRITIN 104  --   TIBC 323  --   IRON  16*  --   RETICCTPCT 1.0  --    Urine analysis:    Component Value Date/Time   COLORURINE YELLOW 07/08/2024 1958   APPEARANCEUR CLEAR 07/08/2024 1958   LABSPEC 1.019 07/08/2024 1958   PHURINE 5.0 07/08/2024 1958   GLUCOSEU NEGATIVE 07/08/2024 1958   HGBUR NEGATIVE 07/08/2024 1958   BILIRUBINUR NEGATIVE 07/08/2024 1958   KETONESUR 5 (A) 07/08/2024 1958   PROTEINUR NEGATIVE 07/08/2024 1958   NITRITE NEGATIVE 07/08/2024 1958   LEUKOCYTESUR NEGATIVE 07/08/2024 1958   Sepsis Labs: Invalid input(s): PROCALCITONIN, LACTICIDVEN  Microbiology: Recent Results (from the past  240 hours)  Resp panel by RT-PCR (RSV, Flu A&B, Covid) Anterior Nasal Swab     Status: Abnormal   Collection Time: 07/08/24 12:42 PM   Specimen: Anterior Nasal Swab  Result Value Ref Range Status   SARS Coronavirus 2 by RT PCR POSITIVE (A) NEGATIVE Final   Influenza A by PCR NEGATIVE NEGATIVE Final   Influenza B by PCR NEGATIVE NEGATIVE Final    Comment: (NOTE) The Xpert Xpress SARS-CoV-2/FLU/RSV plus assay is intended as an aid in the diagnosis of influenza from Nasopharyngeal swab specimens and should not be used as a sole basis for treatment. Nasal washings and aspirates are unacceptable for Xpert Xpress SARS-CoV-2/FLU/RSV testing.  Fact Sheet for Patients: BloggerCourse.com  Fact Sheet for Healthcare Providers: SeriousBroker.it  This test is not yet approved or cleared by the [place]  FDA and has been authorized for detection and/or diagnosis of SARS-CoV-2 by FDA under an Emergency Use Authorization (EUA). This EUA will remain in  effect (meaning this test can be used) for the duration of the COVID-19 declaration under Section 564(b)(1) of the Act, 21 U.S.C. section 360bbb-3(b)(1), unless the authorization is terminated or revoked.     Resp Syncytial Virus by PCR NEGATIVE NEGATIVE Final    Comment: (NOTE) Fact Sheet for Patients: BloggerCourse.com  Fact Sheet for Healthcare Providers: SeriousBroker.it  This test is not yet approved or cleared by the [place]  FDA and has been authorized for detection and/or diagnosis of SARS-CoV-2 by FDA under an Emergency Use Authorization (EUA). This EUA will remain in effect (meaning this test can be used) for the duration of the COVID-19 declaration under Section 564(b)(1) of the Act, 21 U.S.C. section 360bbb-3(b)(1), unless the authorization is terminated or revoked.  Performed at [facility], [address] [place], [place] [address]     Radiology Studies: DG CHEST PORT 1 VIEW Result Date: 07/08/2024 CLINICAL DATA:  [id number] COVID-19 virus infection [id number] EXAM: PORTABLE CHEST 1 VIEW COMPARISON:  Chest x-ray 04/26/2024. FINDINGS: Left chest wall 4 lead cardiac pacemaker defibrillator. The heart and mediastinal contours are unchanged. Mitral annular calcification. Atherosclerotic plaque. No focal consolidation. Chronic coarsened interstitial markings with no overt pulmonary edema. No pleural effusion. No pneumothorax. No acute osseous abnormality. IMPRESSION: 1. No active disease. 2. Aortic Atherosclerosis (ICD10-I70.0) including mitral annular calcification. Electronically Signed   By: Morgane  Naveau M.D.   On: 07/08/2024 20:48      Netasha Wehrli T. Elyce Zollinger [facility]  If 7PM-7AM, please contact night-coverage [email or web address] 07/09/2024, 2:42 PM

## 2024-07-09 NOTE — Evaluation (Signed)
 Physical Therapy Evaluation Patient Details Name: Mike Gray MRN: [id number] DOB: Feb 11, 1941 Today's Date: 07/09/2024  History of Present Illness  Pt is an 83 y/o M admitted on 07/08/24 after presenting with c/o worsening debility & fatigue in the setting of recent COVID infection. PMH: COPD, complete AV block s/p dual-chamber ICD, chronic systolic CHF, a-fib, v-tach, CKD 3B, BPH, anemia, hypertrophic cardiomyopathy, HTN  Clinical Impression  Pt seen for PT evaluation with pt agreeable. Pt presents with poor cognition, increased processing, question if pt is HOH. Unable to gather PLOF/home set up from pt, pt only oriented to self & hospital. Pt requires max assist for bed mobility with hospital bed features, sit>stand from EOB with min assist, & stand pivot bed>recliner with RW & mod assist. Recommend ongoing PT services to progress balance, gait, & reduce fall risk. Recommend post acute rehab <3 hours therapy/day upon d/c.        If plan is discharge home, recommend the following: A lot of help with walking and/or transfers;A lot of help with bathing/dressing/bathroom;Assistance with feeding;Assistance with cooking/housework;Help with stairs or ramp for entrance   Can travel by private vehicle   No    Equipment Recommendations Other (comment) (defer to next venue)  Recommendations for Other Services       Functional Status Assessment Patient has had a recent decline in their functional status and demonstrates the ability to make significant improvements in function in a reasonable and predictable amount of time.     Precautions / Restrictions Precautions Precautions: Fall Restrictions Weight Bearing Restrictions Per Provider Order: No      Mobility  Bed Mobility Overal bed mobility: Needs Assistance Bed Mobility: Supine to Sit     Supine to sit: Max assist, HOB elevated, Used rails (exit L side of bed, assistance with moving BLE to EOB, uprighting trunk)           Transfers Overall transfer level: Needs assistance Equipment used: Rolling walker (2 wheels) Transfers: Sit to/from Stand, Bed to chair/wheelchair/BSC Sit to Stand: Min assist (from EOB with RW, attempts 2nd time from recliner but poor initiation, inability to clear buttocks, poor awareness of hand placement)   Step pivot transfers: Mod assist (decreased awareness of need to fully turn to recliner before sitting & begins sitting too soon, assistance to safely lower in to recliner, with RW)            Ambulation/Gait                  Stairs            Wheelchair Mobility     Tilt Bed    Modified Rankin (Stroke Patients Only)       Balance Overall balance assessment: Needs assistance, History of Falls Sitting-balance support: Feet supported, Bilateral upper extremity supported Sitting balance-Leahy Scale: Fair     Standing balance support: During functional activity, Bilateral upper extremity supported, Reliant on assistive device for balance Standing balance-Leahy Scale: Poor                               Pertinent Vitals/Pain Pain Assessment Pain Assessment: Faces Faces Pain Scale: Hurts a little bit Pain Location: generalized Pain Descriptors / Indicators: Grimacing Pain Intervention(s): Limited activity within patient's tolerance, Monitored during session, Repositioned    Home Living Family/patient expects to be discharged to:: Private residence Living Arrangements: Other relatives (brother) Available Help at Discharge: Family Type of Home: House  Home Access: Stairs to enter Entrance Stairs-Rails: None Entrance Stairs-Number of Steps: 4   Home Layout: Multi-level;Bed/bath upstairs;Able to live on main level with bedroom/bathroom Home Equipment: Cane - single point;Grab bars - tub/shower Additional Comments: Above information taken from previous chart entry; pt with poor cognition, delayed response to questions. Pt does confirm he  lives with brother.    Prior Function               Mobility Comments: per chart, pt reports that he occasionally uses a SPC however is mostly independent       Extremity/Trunk Assessment   Upper Extremity Assessment Upper Extremity Assessment: Generalized weakness    Lower Extremity Assessment Lower Extremity Assessment: Generalized weakness    Cervical / Trunk Assessment Cervical / Trunk Assessment: Kyphotic (question scoliosis, pt with L thoracic concavity?)  Communication   Communication Factors Affecting Communication:  (question if pt is hearing impaired)    Cognition Arousal: Alert Behavior During Therapy: Flat affect   PT - Cognitive impairments: No family/caregiver present to determine baseline, Awareness, Orientation, Memory, Attention, Sequencing, Initiation, Problem solving, Safety/Judgement   Orientation impairments: Place, Time, Situation (oriented to hospital but reports he's in [place])                     Following commands: Impaired Following commands impaired: Follows one step commands with increased time, Follows one step commands inconsistently     Cueing Cueing Techniques: Verbal cues, Tactile cues, Visual cues     General Comments General comments (skin integrity, edema, etc.): SpO2 93% sitting in recliner after transfer on room air, HR 100 bpm    Exercises     Assessment/Plan    PT Assessment Patient needs continued PT services  PT Problem List Decreased strength;Decreased cognition;Decreased activity tolerance;Decreased balance;Decreased mobility;Decreased safety awareness;Decreased knowledge of use of DME;Decreased knowledge of precautions;Decreased range of motion;Decreased coordination       PT Treatment Interventions DME instruction;Balance training;Gait training;Neuromuscular re-education;Stair training;Functional mobility training;Therapeutic activities;Therapeutic exercise;Patient/family education    PT Goals (Current  goals can be found in the Care Plan section)  Acute Rehab PT Goals PT Goal Formulation: Patient unable to participate in goal setting Time For Goal Achievement: 07/23/24 Potential to Achieve Goals: Fair    Frequency Min 2X/week     Co-evaluation               AM-PAC PT 6 Clicks Mobility  Outcome Measure Help needed turning from your back to your side while in a flat bed without using bedrails?: A Lot Help needed moving from lying on your back to sitting on the side of a flat bed without using bedrails?: Total Help needed moving to and from a bed to a chair (including a wheelchair)?: A Lot Help needed standing up from a chair using your arms (e.g., wheelchair or bedside chair)?: A Lot Help needed to walk in hospital room?: Total Help needed climbing 3-5 steps with a railing? : Total 6 Click Score: 9    End of Session Equipment Utilized During Treatment: Gait belt Activity Tolerance: Patient limited by fatigue Patient left: in chair;with call bell/phone within reach;with chair alarm set Nurse Communication: Mobility status PT Visit Diagnosis: Unsteadiness on feet (R26.81);History of falling (Z91.81);Muscle weakness (generalized) (M62.81);Other abnormalities of gait and mobility (R26.89);Difficulty in walking, not elsewhere classified (R26.2)    Time: 9048-8993 PT Time Calculation (min) (ACUTE ONLY): 15 min   Charges:   PT Evaluation $PT Eval Moderate Complexity: 1 Mod  PT General Charges $$ ACUTE PT VISIT: 1 Visit         Richerd Pinal, PT, DPT 07/09/24, 10:31 AM   Richerd CHRISTELLA Pinal 07/09/2024, 10:30 AM

## 2024-07-09 NOTE — NC FL2 (Signed)
 [place]  MEDICAID FL2 LEVEL OF CARE FORM     IDENTIFICATION  Patient Name: Mike Gray Birthdate: Jan 19, 1941 Sex: male Admission Date (Current Location): 07/08/2024  [facility] and [place] Number:  Producer, television/film/video and Address:  [facility], [address], [place], [place] [address]      Provider Number: [id number]  Attending Physician Name and Address:  Kathrin Mignon DASEN, MD  Relative Name and Phone Number:  Karlis Cregg [phone]    Current Level of Care: Hospital Recommended Level of Care: Skilled Nursing Facility Prior Approval Number:    Date Approved/Denied:   PASRR Number: [id number]  Discharge Plan: SNF    Current Diagnoses: Patient Active Problem List   Diagnosis Date Noted   COVID-19 virus infection 07/08/2024   Debility 07/08/2024   Anemia 07/08/2024   Sepsis (HCC) 04/24/2024   CKD (chronic kidney disease) stage 3, GFR 30-59 ml/min (HCC) 04/24/2024   Elevated troponin 04/24/2024   Long term (current) use of anticoagulants 11/20/2022   Lobar pneumonia (HCC) 08/02/2021   PAF (paroxysmal atrial fibrillation) (HCC) 07/19/2021   Increased ammonia level 07/19/2021   Paroxysmal ventricular tachycardia (HCC) 03/22/2021   Left ear impacted cerumen 03/08/2021   Benign prostatic hyperplasia with lower urinary tract symptoms 12/07/2020   Hearing loss 12/07/2020   Hypertensive heart failure (HCC) 12/07/2020   Pure hypercholesterolemia 12/07/2020   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 (HCC) 07/05/2020   COPD with acute exacerbation (HCC) 07/05/2020   Bipolar 1 disorder (HCC) 07/05/2020   Unspecified atrial fibrillation (HCC) 07/05/2020   Chronic systolic CHF (congestive heart failure) (HCC) 07/05/2020   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   History of complete heart block 07/05/2020   ICD (implantable cardioverter-defibrillator) in place 07/05/2020   Acute metabolic  encephalopathy 07/04/2020    Orientation RESPIRATION BLADDER Height & Weight     Place, Self  Normal Continent Weight:   Height:     BEHAVIORAL SYMPTOMS/MOOD NEUROLOGICAL BOWEL NUTRITION STATUS      Continent Diet (See dc summary)  AMBULATORY STATUS COMMUNICATION OF NEEDS Skin   Extensive Assist Verbally Normal                       Personal Care Assistance Level of Assistance  Bathing, Feeding, Dressing Bathing Assistance: Maximum assistance Feeding assistance: Independent Dressing Assistance: Maximum assistance     Functional Limitations Info  Sight, Hearing, Speech Sight Info: Adequate Hearing Info: Adequate Speech Info: Adequate    SPECIAL CARE FACTORS FREQUENCY  PT (By licensed PT), OT (By licensed OT)     PT Frequency: 5x a week OT Frequency: 5x a week            Contractures Contractures Info: Not present    Additional Factors Info  Code Status, Allergies Code Status Info: Full Allergies Info: NKA           Current Medications (07/09/2024):  This is the current hospital active medication list Current Facility-Administered Medications  Medication Dose Route Frequency Provider Last Rate Last Admin   0.9 %  sodium chloride  infusion  250 mL Intravenous PRN Doutova, Anastassia, MD       acetaminophen  (TYLENOL ) tablet 650 mg  650 mg Oral Q6H PRN Doutova, Anastassia, MD   650 mg at 07/09/24 0854   Or   acetaminophen  (TYLENOL ) suppository 650 mg  650 mg Rectal Q6H PRN Doutova, Anastassia, MD       amiodarone  (PACERONE ) tablet  100 mg  100 mg Oral Daily Doutova, Anastassia, MD   100 mg at 07/09/24 9145   apixaban  (ELIQUIS ) tablet 5 mg  5 mg Oral BID Doutova, Anastassia, MD   5 mg at 07/09/24 0854   atorvastatin  (LIPITOR) tablet 20 mg  20 mg Oral QHS Doutova, Anastassia, MD   20 mg at 07/09/24 0130   budesonide -glycopyrrolate -formoterol  (BREZTRI ) 160-9-4.8 MCG/ACT inhaler 2 puff  2 puff Inhalation BID Doutova, Anastassia, MD   2 puff at 07/09/24 0859    finasteride  (PROSCAR ) tablet 5 mg  5 mg Oral QHS Doutova, Anastassia, MD   5 mg at 07/09/24 0130   HYDROcodone -acetaminophen  (NORCO/VICODIN) 5-325 MG per tablet 1-2 tablet  1-2 tablet Oral Q4H PRN Doutova, Anastassia, MD       magnesium  sulfate IVPB 2 g 50 mL  2 g Intravenous Once Gonfa, Taye T, MD 50 mL/hr at 07/09/24 1201 2 g at 07/09/24 1201   ondansetron  (ZOFRAN ) tablet 4 mg  4 mg Oral Q6H PRN Doutova, Anastassia, MD       Or   ondansetron  (ZOFRAN ) injection 4 mg  4 mg Intravenous Q6H PRN Doutova, Anastassia, MD       sodium chloride  flush (NS) 0.9 % injection 3 mL  3 mL Intravenous Q12H Doutova, Anastassia, MD   3 mL at 07/09/24 1202   sodium chloride  flush (NS) 0.9 % injection 3 mL  3 mL Intravenous PRN Doutova, Anastassia, MD       tamsulosin  (FLOMAX ) capsule 0.8 mg  0.8 mg Oral QPC supper Doutova, Anastassia, MD       thiamine  (VITAMIN B1) injection 100 mg  100 mg Intravenous Daily Doutova, Anastassia, MD   100 mg at 07/09/24 9145     Discharge Medications: Please see discharge summary for a list of discharge medications.  Relevant Imaging Results:  Relevant Lab Results:   Additional Information SSN:[id number]  Lendia Dais, LCSWA

## 2024-07-09 NOTE — Plan of Care (Signed)

## 2024-07-10 DIAGNOSIS — N401 Enlarged prostate with lower urinary tract symptoms: Secondary | ICD-10-CM | POA: Diagnosis not present

## 2024-07-10 DIAGNOSIS — I472 Ventricular tachycardia, unspecified: Secondary | ICD-10-CM | POA: Diagnosis not present

## 2024-07-10 DIAGNOSIS — U071 COVID-19: Secondary | ICD-10-CM | POA: Diagnosis not present

## 2024-07-10 DIAGNOSIS — I480 Paroxysmal atrial fibrillation: Secondary | ICD-10-CM | POA: Diagnosis not present

## 2024-07-10 LAB — CBC
HCT: 38.5 % — ABNORMAL LOW (ref 39.0–52.0)
Hemoglobin: 12.5 g/dL — ABNORMAL LOW (ref 13.0–17.0)
MCH: 28.9 pg (ref 26.0–34.0)
MCHC: 32.5 g/dL (ref 30.0–36.0)
MCV: 88.9 fL (ref 80.0–100.0)
Platelets: 111 K/uL — ABNORMAL LOW (ref 150–400)
RBC: 4.33 MIL/uL (ref 4.22–5.81)
RDW: 15.9 % — ABNORMAL HIGH (ref 11.5–15.5)
WBC: 4.6 K/uL (ref 4.0–10.5)
nRBC: 0 % (ref 0.0–0.2)

## 2024-07-10 LAB — RENAL FUNCTION PANEL
Albumin: 3 g/dL — ABNORMAL LOW (ref 3.5–5.0)
Anion gap: 14 (ref 5–15)
BUN: 27 mg/dL — ABNORMAL HIGH (ref 8–23)
CO2: 22 mmol/L (ref 22–32)
Calcium: 9.4 mg/dL (ref 8.9–10.3)
Chloride: 98 mmol/L (ref 98–111)
Creatinine, Ser: 1.61 mg/dL — ABNORMAL HIGH (ref 0.61–1.24)
GFR, Estimated: 42 mL/min — ABNORMAL LOW (ref >60)
Glucose, Bld: 84 mg/dL (ref 70–99)
Phosphorus: 3.5 mg/dL (ref 2.5–4.6)
Potassium: 4.2 mmol/L (ref 3.5–5.1)
Sodium: 134 mmol/L — ABNORMAL LOW (ref 135–145)

## 2024-07-10 LAB — T4, FREE: Free T4: 1.83 ng/dL — ABNORMAL HIGH (ref 0.61–1.12)

## 2024-07-10 LAB — GLUCOSE, CAPILLARY: Glucose-Capillary: 145 mg/dL — ABNORMAL HIGH (ref 70–99)

## 2024-07-10 LAB — TSH: TSH: <0.1 u[IU]/mL — ABNORMAL LOW (ref 0.350–4.500)

## 2024-07-10 LAB — MAGNESIUM: Magnesium: 1.9 mg/dL (ref 1.7–2.4)

## 2024-07-10 MED ORDER — SODIUM CHLORIDE 0.9 % IV BOLUS
500.0000 mL | Freq: Two times a day (BID) | INTRAVENOUS | Status: AC
  Administered 2024-07-10 (×2): 500 mL via INTRAVENOUS

## 2024-07-10 MED ORDER — PROPRANOLOL HCL 20 MG PO TABS
10.0000 mg | ORAL_TABLET | Freq: Three times a day (TID) | ORAL | Status: DC
  Administered 2024-07-10: 10 mg via ORAL
  Filled 2024-07-10: qty 1

## 2024-07-10 MED ORDER — AMLODIPINE BESYLATE 10 MG PO TABS
10.0000 mg | ORAL_TABLET | Freq: Every day | ORAL | Status: DC
  Administered 2024-07-10 – 2024-07-12 (×3): 10 mg via ORAL
  Filled 2024-07-10 (×3): qty 1

## 2024-07-10 MED ORDER — METOPROLOL SUCCINATE ER 100 MG PO TB24
100.0000 mg | ORAL_TABLET | Freq: Every day | ORAL | Status: DC
  Administered 2024-07-11 – 2024-07-12 (×2): 100 mg via ORAL
  Filled 2024-07-10 (×2): qty 1

## 2024-07-10 MED ORDER — THIAMINE MONONITRATE 100 MG PO TABS
100.0000 mg | ORAL_TABLET | Freq: Every day | ORAL | Status: DC
Start: 2024-07-11 — End: 2024-07-12
  Administered 2024-07-11 – 2024-07-12 (×2): 100 mg via ORAL
  Filled 2024-07-10 (×2): qty 1

## 2024-07-10 MED ORDER — HYDRALAZINE HCL 25 MG PO TABS: 25.0000 mg | ORAL_TABLET | Freq: Four times a day (QID) | ORAL | Status: DC | PRN

## 2024-07-10 MED ORDER — PROPRANOLOL HCL 20 MG PO TABS
20.0000 mg | ORAL_TABLET | Freq: Three times a day (TID) | ORAL | Status: DC
  Administered 2024-07-10: 20 mg via ORAL
  Filled 2024-07-10: qty 1

## 2024-07-10 MED ORDER — OXYCODONE HCL 5 MG PO TABS: 5.0000 mg | ORAL_TABLET | Freq: Three times a day (TID) | ORAL | Status: DC | PRN

## 2024-07-10 NOTE — Progress Notes (Signed)
 PROGRESS NOTE  Mike Gray FMW:[id number] DOB: 09-Jun-1941   PCP: No primary care provider on file.  Patient is from: Home.  Lives with brother.  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  Reportedly had COVID and flu vaccine on 9/13.  [name] with respiratory symptoms as well.  In ED, slightly elevated BP on arrival but improved quickly.  No oxygen requirement.  CMP and CBC without significant finding other than thrombocytopenia.  Pro-Cal and lactic acid normal.  COVID-19 PCR positive.  UA unrevealing.  CT head without acute finding.  CXR without infiltrate.  Admitted for physical deconditioning the setting of COVID infection.   Further workup revealed hyperthyroidism and AKI.  He has undetectable TSH and elevated free T4.  Patient was on amiodarone . Amiodarone  discontinued.   Started on IV fluid for AKI.    Subjective: Seen and examined earlier this afternoon.  No major events overnight of this morning.  No complaints but not a great historian.  He is only oriented to self and place.  He denies pain, shortness of breath, cough although he is coughing some.  Denies nausea or vomiting. Objective: Vitals:   07/10/24 0744 07/10/24 1328 07/10/24 1433 07/10/24 1540  BP: (!) 186/98 (!) 157/99 128/70 (!) 150/79  Pulse: 99 80  73  Resp: 18   18  Temp: 98.6 F (37 C)   98.3 F (36.8 C)  TempSrc:    Oral  SpO2: 98%   96%  Weight:   69.6 kg     Examination:  GENERAL: No apparent distress.  Nontoxic. HEENT: MMM.  Vision and hearing grossly intact.  NECK: Supple.  No apparent JVD.  RESP:  No IWOB.   Fair aeration bilaterally.  Rhonchi bilaterally.  Intermittent cough. CVS:  RRR. Heart sounds normal.  ABD/GI/GU: BS+. Abd soft, NTND.  MSK/EXT:  Moves extremities. No apparent deformity. No edema.  SKIN: no apparent skin lesion or wound NEURO: AA.  Oriented to self and place.  No apparent focal neuro deficit.  Seems to have tremors that are exaggerated by activities. PSYCH: Calm. Normal affect.   Consultants:  None  Procedures: None  Microbiology summarized: COVID-19 PCR positive Influenza and RSV PCR nonreactive  Assessment and plan: COVID-19 infection: Reportedly had DOE.  Some rhonchi and cough on exam.  No respiratory distress or oxygen requirement.  No infiltrate on chest x-ray.  Could be contributing to patient's weakness and fatigue. - Supportive care - Isolation precaution  Generalized weakness/physical deconditioning: Could be due to COVID-19 infection.  Undetectable TSH with elevated free T4.  He is on amiodarone  for A-fib. - Supportive care for COVID-19 as above - Discontinue amiodarone  - PT/OT-recommended SNF.  Chronic HFrEF/history of VT/HCM s/p dual-chamber AICD.  Appears euvolemic on exam.  On p.o. Lasix  20 mg daily at home. - Continue holding diuretics. -Resume home Toprol -XL - Monitor fluid and respiratory status.  Paroxysmal A-fib/CHB s/p pacemaker.  On amiodarone , Toprol -XL and Eliquis  at home. - Discontinue amiodarone  due to hyperthyroidism. - Resume home Toprol -XL - Continue home Eliquis .  Chronic COPD: Without exacerbation. - Continue home Breztri  - DuoNebs as needed for  AKI on CKD-3A: b/l  Cr 1.2-1.3.  Due to poor p.o. intake?  Not on nephrotoxic meds. -IV NS bolus 500 cc twice daily x 2. -Recheck renal function in the morning  Uncontrolled hypertension: Elevated this morning. - Received propranolol  today.  Will resume home Toprol -XL on 9/19 - Start amlodipine  10 mg daily - Hydralazine  as needed  BPH - Continue home Proscar  and Flomax     Bipolar 1 disorder: Depakote  therapeutic. -Continue home Depakote .  Pure hypercholesterolemia -Continue Lipitor 20 mg daily   Thrombocytopenia: Slightly worse today.  No bleeding. -Monitor  Cognitive impairment:  He is awake but only oriented to self and place.  [name] reports some cognitive decline lately. - Reorientation and delirium precaution - Consider formal assessment after acute hospitalization.  Intention tremor: - Resume home beta-blocker.  May consider primidone if no improvement  Advance care planning: Admitted as full code.  Has MOST form that shows DNR-Limited.  [name] confirms DNR status. - Changed CODE STATUS to DNR.  Unintentional weight loss Body mass index is 23.33 kg/m. Nutrition Problem: Unintentional weight loss Etiology: acute illness Signs/Symptoms: other (comment) (-12.6% weight loss x 6 months) Interventions: Ensure Enlive (each supplement provides 350kcal and 20 grams of protein)   DVT prophylaxis:  SCDs Start: 07/08/24 2336 apixaban  (ELIQUIS ) tablet 5 mg  Code Status: Full code Family Communication: Updated patient's brother over the phone. Level of care: Telemetry Medical Status is: Observation The patient will require care spanning > 2 midnights and should be moved to inpatient because: AKI, COVID-19 infection, physical deconditioning   Final disposition: SNF   55 minutes with more than 50% spent in reviewing records, counseling patient/family and coordinating care.   Sch Meds:  Scheduled Meds:  amLODipine   10 mg Oral Daily   apixaban   5 mg Oral BID   atorvastatin   20 mg Oral QHS   budesonide -glycopyrrolate -formoterol   2 puff Inhalation BID   divalproex   500 mg Oral BID   feeding supplement  237 mL Oral BID BM   finasteride   5 mg Oral QHS   propranolol   20 mg Oral TID   sodium chloride  flush  3 mL Intravenous Q12H   tamsulosin   0.8 mg Oral QPC supper   [START ON 07/11/2024] thiamine   100 mg Oral Daily    Continuous Infusions:  sodium chloride  500 mL (07/10/24 0948)   PRN Meds:.acetaminophen  **OR** acetaminophen , hydrALAZINE , ipratropium-albuterol , ondansetron  **OR** ondansetron  (ZOFRAN ) IV, oxyCODONE , sodium chloride  flush  Antimicrobials: Anti-infectives (From admission, onward)    None        I have personally reviewed the following labs and images: CBC: Recent Labs  Lab 07/08/24 1255 07/09/24 0033 07/10/24 0350  WBC 4.7 4.2 4.6  HGB 12.3* 12.8* 12.5*  HCT 38.5* 38.5* 38.5*  MCV 91.0 87.9 88.9  PLT 98* 120* 111*   BMP &GFR Recent Labs  Lab 07/08/24 1255 07/09/24 0033 07/10/24 0350  NA 136 137 134*  K 3.9 4.1 4.2  CL 106 103 98  CO2 21* 24 22  GLUCOSE 87 94 84  BUN 25* 21 27*  CREATININE 1.38* 1.27* 1.61*  CALCIUM  9.4 9.5 9.4  MG  --  1.6* 1.9  PHOS  --  2.8 3.5   Estimated Creatinine Clearance: 34.2 mL/min (A) (by C-G formula based on SCr of 1.61 mg/dL (H)). Liver & Pancreas: Recent Labs  Lab 07/08/24 1255 07/09/24 0033 07/10/24 0350  AST 25 26  --   ALT 16 16  --   ALKPHOS 44 47  --   BILITOT 0.4  1.1  --   PROT 6.1* 6.6  --   ALBUMIN 3.0* 3.3* 3.0*   No results for input(s): LIPASE, AMYLASE in the last 168 hours. No results for input(s): AMMONIA in the last 168 hours. Diabetic: No results for input(s): HGBA1C in the last 72 hours. Recent Labs  Lab 07/08/24 1255 07/09/24 0743 07/10/24 0830  GLUCAP 80 102* 145*   Cardiac Enzymes: Recent Labs  Lab 07/09/24 0032  CKTOTAL 58   No results for input(s): PROBNP in the last 8760 hours. Coagulation Profile: No results for input(s): INR, PROTIME in the last 168 hours. Thyroid  Function Tests: Recent Labs    07/10/24 0350  TSH <0.100*  FREET4 1.83*   Lipid Profile: No results for input(s): CHOL, HDL, LDLCALC, TRIG, CHOLHDL, LDLDIRECT in the last 72 hours. Anemia Panel: Recent Labs    07/09/24 0032 07/09/24 0033  VITAMINB12  --  582  FOLATE  --  >20.0   FERRITIN 104  --   TIBC 323  --   IRON  16*  --   RETICCTPCT 1.0  --    Urine analysis:    Component Value Date/Time   COLORURINE YELLOW 07/08/2024 1958   APPEARANCEUR CLEAR 07/08/2024 1958   LABSPEC 1.019 07/08/2024 1958   PHURINE 5.0 07/08/2024 1958   GLUCOSEU NEGATIVE 07/08/2024 1958   HGBUR NEGATIVE 07/08/2024 1958   BILIRUBINUR NEGATIVE 07/08/2024 1958   KETONESUR 5 (A) 07/08/2024 1958   PROTEINUR NEGATIVE 07/08/2024 1958   NITRITE NEGATIVE 07/08/2024 1958   LEUKOCYTESUR NEGATIVE 07/08/2024 1958   Sepsis Labs: Invalid input(s): PROCALCITONIN, LACTICIDVEN  Microbiology: Recent Results (from the past 240 hours)  Resp panel by RT-PCR (RSV, Flu A&B, Covid) Anterior Nasal Swab     Status: Abnormal   Collection Time: 07/08/24 12:42 PM   Specimen: Anterior Nasal Swab  Result Value Ref Range Status   SARS Coronavirus 2 by RT PCR POSITIVE (A) NEGATIVE Final   Influenza A by PCR NEGATIVE NEGATIVE Final   Influenza B by PCR NEGATIVE NEGATIVE Final    Comment: (NOTE) The Xpert Xpress SARS-CoV-2/FLU/RSV plus assay is intended as an aid in the diagnosis of influenza from Nasopharyngeal swab specimens and should not be used as a sole basis for treatment. Nasal washings and aspirates are unacceptable for Xpert Xpress SARS-CoV-2/FLU/RSV testing.  Fact Sheet for Patients: BloggerCourse.com  Fact Sheet for Healthcare Providers: SeriousBroker.it  This test is not yet approved or cleared by the [place]  FDA and has been authorized for detection and/or diagnosis of SARS-CoV-2 by FDA under an Emergency Use Authorization (EUA). This EUA will remain in effect (meaning this test can be used) for the duration of the COVID-19 declaration under Section 564(b)(1) of the Act, 21 U.S.C. section 360bbb-3(b)(1), unless the authorization is terminated or revoked.     Resp Syncytial Virus by PCR NEGATIVE NEGATIVE Final    Comment:  (NOTE) Fact Sheet for Patients: BloggerCourse.com  Fact Sheet for Healthcare Providers: SeriousBroker.it  This test is not yet approved or cleared by the [place]  FDA and has been authorized for detection and/or diagnosis of SARS-CoV-2 by FDA under an Emergency Use Authorization (EUA). This EUA will remain in effect (meaning this test can be used) for the duration of the COVID-19 declaration under Section 564(b)(1) of the Act, 21 U.S.C. section 360bbb-3(b)(1), unless the authorization is terminated or revoked.  Performed at [facility], [address], [place], [place] [address]     Radiology Studies: No results found.  Brena Windsor T. Amandine Covino [facility]  If 7PM-7AM, please contact night-coverage [email or web address] 07/10/2024, 4:19 PM

## 2024-07-10 NOTE — Plan of Care (Signed)
  Problem: Education: Goal: Knowledge of General Education information will improve Description: Including pain rating scale, medication(s)/side effects and non-pharmacologic comfort measures Outcome: Progressing   Problem: Health Behavior/Discharge Planning: Goal: Ability to manage health-related needs will improve Outcome: Progressing   Problem: Clinical Measurements: Goal: Will remain free from infection Outcome: Progressing   Problem: Elimination: Goal: Will not experience complications related to bowel motility Outcome: Progressing   Problem: Pain Managment: Goal: General experience of comfort will improve and/or be controlled Outcome: Progressing   Problem: Skin Integrity: Goal: Risk for impaired skin integrity will decrease Outcome: Progressing

## 2024-07-11 DIAGNOSIS — N401 Enlarged prostate with lower urinary tract symptoms: Secondary | ICD-10-CM | POA: Diagnosis not present

## 2024-07-11 DIAGNOSIS — Z8679 Personal history of other diseases of the circulatory system: Secondary | ICD-10-CM | POA: Diagnosis not present

## 2024-07-11 DIAGNOSIS — U071 COVID-19: Secondary | ICD-10-CM | POA: Diagnosis not present

## 2024-07-11 DIAGNOSIS — I472 Ventricular tachycardia, unspecified: Secondary | ICD-10-CM | POA: Diagnosis not present

## 2024-07-11 LAB — CBC
HCT: 36.9 % — ABNORMAL LOW (ref 39.0–52.0)
Hemoglobin: 12 g/dL — ABNORMAL LOW (ref 13.0–17.0)
MCH: 29.2 pg (ref 26.0–34.0)
MCHC: 32.5 g/dL (ref 30.0–36.0)
MCV: 89.8 fL (ref 80.0–100.0)
Platelets: 121 K/uL — ABNORMAL LOW (ref 150–400)
RBC: 4.11 MIL/uL — ABNORMAL LOW (ref 4.22–5.81)
RDW: 15.9 % — ABNORMAL HIGH (ref 11.5–15.5)
WBC: 4.1 K/uL (ref 4.0–10.5)
nRBC: 0 % (ref 0.0–0.2)

## 2024-07-11 LAB — RENAL FUNCTION PANEL
Albumin: 2.6 g/dL — ABNORMAL LOW (ref 3.5–5.0)
Anion gap: 5 (ref 5–15)
BUN: 22 mg/dL (ref 8–23)
CO2: 27 mmol/L (ref 22–32)
Calcium: 9 mg/dL (ref 8.9–10.3)
Chloride: 105 mmol/L (ref 98–111)
Creatinine, Ser: 1.16 mg/dL (ref 0.61–1.24)
GFR, Estimated: >60 mL/min (ref >60)
Glucose, Bld: 94 mg/dL (ref 70–99)
Phosphorus: 2.9 mg/dL (ref 2.5–4.6)
Potassium: 4 mmol/L (ref 3.5–5.1)
Sodium: 137 mmol/L (ref 135–145)

## 2024-07-11 LAB — MAGNESIUM: Magnesium: 1.7 mg/dL (ref 1.7–2.4)

## 2024-07-11 LAB — VITAMIN B1: Vitamin B1 (Thiamine): 291.8 nmol/L — ABNORMAL HIGH (ref 66.5–200.0)

## 2024-07-11 MED ORDER — AMLODIPINE BESYLATE 5 MG PO TABS: 5.0000 mg | ORAL_TABLET | Freq: Every day | ORAL | Status: DC

## 2024-07-11 MED ORDER — ENSURE PLUS HIGH PROTEIN PO LIQD: 237.0000 mL | Freq: Two times a day (BID) | ORAL | Status: DC

## 2024-07-11 MED ORDER — METOPROLOL SUCCINATE ER 100 MG PO TB24: 100.0000 mg | ORAL_TABLET | Freq: Every day | ORAL | Status: DC

## 2024-07-11 MED ORDER — IRON SUCROSE 300 MG IVPB - SIMPLE MED
300.0000 mg | Freq: Once | Status: AC
  Administered 2024-07-11: 300 mg via INTRAVENOUS
  Filled 2024-07-11: qty 300

## 2024-07-11 NOTE — Progress Notes (Signed)
 SATURATION QUALIFICATIONS: (This note is used to comply with regulatory documentation for home oxygen)  Patient Saturations on Room Air at Rest = 90%  Patient Saturations on Room Air while Ambulating = 84%  Patient Saturations on 1 Liters of oxygen while Ambulating = 95%  Please briefly explain why patient needs home oxygen: desaturation with ambulation or any activity. Walked 30 feet with walker.

## 2024-07-11 NOTE — Progress Notes (Signed)
 Physical Therapy Treatment Patient Details Name: Mike Gray MRN: [id number] DOB: Nov 25, 1940 Today's Date: 07/11/2024   History of Present Illness Pt is an 83 y/o M admitted on 07/08/24 after presenting with c/o worsening debility & fatigue in the setting of recent COVID infection. PMH: COPD, complete AV block s/p dual-chamber ICD, chronic systolic CHF, a-fib, v-tach, CKD 3B, BPH, anemia, hypertrophic cardiomyopathy, HTN    PT Comments  Pt seen for PT evaluation with pt agreeable. Pt demonstrates improved processing time but still delayed compared to normal. Pt is able to complete bed mobility with min assist, transfers with min assist, & ambulation in room with RW & min assist. Pt with decreased awareness of safety, requiring cuing for proper use of AD & when turning to sit on chair. Recommend ongoing PT services to progress gait with LRAD, balance, endurance, & strengthening. Continue to recommend post acute rehab <3 hours therapy/day upon d/c.    If plan is discharge home, recommend the following: A little help with walking and/or transfers;A little help with bathing/dressing/bathroom;Assistance with cooking/housework;Assist for transportation;Help with stairs or ramp for entrance   Can travel by private vehicle     Yes  Equipment Recommendations  None recommended by PT (defer to next venue)    Recommendations for Other Services       Precautions / Restrictions Precautions Precautions: Fall Restrictions Weight Bearing Restrictions Per Provider Order: No     Mobility  Bed Mobility Overal bed mobility: Needs Assistance Bed Mobility: Supine to Sit     Supine to sit: Min assist, HOB elevated, Used rails (pt requires cuing/suppor to scoot to sitting EOB with extra time)          Transfers Overall transfer level: Needs assistance Equipment used: Rolling walker (2 wheels) Transfers: Sit to/from Stand Sit to Stand: Min assist           General transfer comment:  sit>stand from EOB, recliner with cuing re: hand placement during sit<>stand, cuing to hold to walker & finish squaring up to seat before sitting    Ambulation/Gait Ambulation/Gait assistance: Contact guard assist, Min assist Gait Distance (Feet): 10 Feet (+ 30 ft) Assistive device: Rolling walker (2 wheels) Gait Pattern/deviations: Decreased step length - left, Decreased step length - right, Decreased stride length, Decreased dorsiflexion - right, Decreased dorsiflexion - left, Trunk flexed Gait velocity: decreased     General Gait Details: decreased ability/awareness re: need to ambulate within base of AD especially when turning, decreased awareness of need to square up to chair prior to sitting   Stairs             Wheelchair Mobility     Tilt Bed    Modified Rankin (Stroke Patients Only)       Balance Overall balance assessment: Needs assistance Sitting-balance support: Feet supported Sitting balance-Leahy Scale: Fair     Standing balance support: Reliant on assistive device for balance, Bilateral upper extremity supported, During functional activity Standing balance-Leahy Scale: Fair                              Hotel manager: Impaired Factors Affecting Communication:  (?hearing impairment)  Cognition Arousal: Alert Behavior During Therapy: Flat affect   PT - Cognitive impairments: No family/caregiver present to determine baseline, Awareness, Orientation, Memory, Attention, Sequencing, Initiation, Problem solving, Safety/Judgement  PT - Cognition Comments: Pt with improving processing time on this date, compared to last time this PT saw him, but still delayed overall. Following commands: Impaired Following commands impaired: Follows one step commands with increased time, Follows multi-step commands inconsistently    Cueing Cueing Techniques: Verbal cues, Tactile cues, Visual cues   Exercises Other Exercises Other Exercises: PT educated pt on use of incentive spirometer with good return demo x 3 times; encouraged pt to use device frequently throughout the day.    General Comments General comments (skin integrity, edema, etc.): Pt on 1.5L/min at rest at beginning & end of session, 2L/min during gait, SPO2 >/= 90%.      Pertinent Vitals/Pain Pain Assessment Pain Assessment: No/denies pain    Home Living                          Prior Function            PT Goals (current goals can now be found in the care plan section) Acute Rehab PT Goals PT Goal Formulation: Patient unable to participate in goal setting Time For Goal Achievement: 07/23/24 Potential to Achieve Goals: Good Progress towards PT goals: Progressing toward goals    Frequency    Min 2X/week      PT Plan      Co-evaluation              AM-PAC PT 6 Clicks Mobility   Outcome Measure  Help needed turning from your back to your side while in a flat bed without using bedrails?: A Little Help needed moving from lying on your back to sitting on the side of a flat bed without using bedrails?: A Lot Help needed moving to and from a bed to a chair (including a wheelchair)?: A Little Help needed standing up from a chair using your arms (e.g., wheelchair or bedside chair)?: A Little Help needed to walk in hospital room?: A Little Help needed climbing 3-5 steps with a railing? : A Lot 6 Click Score: 16    End of Session   Activity Tolerance: Patient limited by fatigue Patient left: in chair;with chair alarm set;with call bell/phone within reach   PT Visit Diagnosis: Unsteadiness on feet (R26.81);History of falling (Z91.81);Muscle weakness (generalized) (M62.81);Other abnormalities of gait and mobility (R26.89);Difficulty in walking, not elsewhere classified (R26.2)     Time: 1343-1401 PT Time Calculation (min) (ACUTE ONLY): 18 min  Charges:    $Therapeutic Activity:  8-22 mins PT General Charges $$ ACUTE PT VISIT: 1 Visit                     Richerd Pinal, PT, DPT 07/11/24, 2:23 PM    Richerd CHRISTELLA Pinal 07/11/2024, 2:19 PM

## 2024-07-11 NOTE — Plan of Care (Signed)

## 2024-07-11 NOTE — TOC Progression Note (Signed)
 Transition of Care [facility]) - Progression Note    Patient Details  Name: Mike Gray MRN: [id number] Date of Birth: Jul 08, 1941  [facility] CM/SW Contact  Lendia Dais, [place] Phone Number: 07/11/2024, 12:47 PM  Clinical Narrative: Patient is disoriented x2.  CSW spoke to [facility] and stated that they cannot take the patient until 10 days after initial testing.  CSW spoke to Lamar (brother) and informed him of [facility]'s answer. Lamar stated that he will tour facilities to chose a bed. CSW Control and [email or web address] list to [email or web address].  TOC will continue to follow.     Expected Discharge Plan: Skilled Nursing Facility Barriers to Discharge: Continued Medical Work up, SNF Pending bed offer               Expected Discharge Plan and Services In-house Referral: Clinical Social Work     Living arrangements for the past 2 months: Single Family Home Expected Discharge Date: 07/11/24                                     Social Drivers of Health (SDOH) Interventions SDOH Screenings   Food Insecurity: No Food Insecurity (07/09/2024)  Housing: Low Risk  (07/09/2024)  Transportation Needs: No Transportation Needs (07/09/2024)  Utilities: Not At Risk (07/09/2024)  Depression (PHQ2-9): Low Risk  (07/18/2022)  Social Connections: Socially Isolated (07/09/2024)  Tobacco Use: Medium Risk (07/08/2024)    Readmission Risk Interventions     No data to display

## 2024-07-11 NOTE — Discharge Summary (Signed)
 Physician Discharge Summary  Mike Gray FMW:[id number] DOB: Feb 01, 1941 DOA: 07/08/2024  PCP: No primary care provider on file.  Admit date: 07/08/2024 Discharge date: 07/11/24  Admitted From: Home Disposition: SNF Recommendations for Outpatient Follow-up:  Consider ambulatory referral to neurology for tremor and cognitive evaluation Check CMP and CBC in 1 week Please follow up on the following pending results: None   Discharge Condition: Stable CODE STATUS: DNR Diet Orders (From admission, onward)     Start     Ordered   07/08/24 2336  Diet Heart Room service appropriate? Yes; Fluid consistency: Thin  Diet effective now       Question Answer Comment  Room service appropriate? 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  Reportedly had COVID and flu vaccine on 9/13.  [name] with respiratory symptoms as well.   In ED, slightly elevated BP on arrival but improved quickly.  No oxygen requirement.  CMP and CBC without significant finding other than thrombocytopenia.  Pro-Cal and lactic acid normal.  COVID-19 PCR positive.  UA unrevealing.  CT head without acute finding.  CXR without infiltrate.  Admitted for physical deconditioning the setting of COVID infection.    Further workup revealed hyperthyroidism and AKI.  He has undetectable TSH and elevated free T4 to 1.81.  Home amiodarone  discontinued.  He is already on metoprolol .   AKI resolved with IV fluid.   Patient developed oxygen requirement of 1L with ambulation on room air before discharge.   Therapy recommended SNF.  See individual problem list below for more.   Problems addressed during this hospitalization COVID-19  infection: Reportedly had DOE.  Some rhonchi and cough on exam.  No respiratory distress or oxygen requirement on admission. No infiltrate on chest x-ray. However, he required 1L with ambulation on the day of discharge on ambulatory saturation assessment.  - Supportive care with bronchodilators and incentive spirometry  - Isolation precaution for 7 more days   Generalized weakness/physical deconditioning: Could be due to COVID-19 infection.  Undetectable TSH with elevated free T4.  He is on amiodarone  for A-fib. - Supportive care for COVID-19 as above - Amiodarone  discontinued. - PT/OT-recommended SNF.   Chronic HFrEF/history of VT/HCM s/p dual-chamber AICD.  Appears euvolemic on exam.  On p.o. Lasix  20 mg daily at home. - Hold home p.o. Lasix  for 5 more days  - Continue Toprol -XL at 100 mg daily - Monitor fluid and respiratory status.   Paroxysmal A-fib/CHB s/p pacemaker.  On amiodarone , Toprol -XL and Eliquis  at home. - Discontinued amiodarone  due to hyperthyroidism. - Resume home Toprol -XL - Continue home Eliquis .   Chronic COPD: Without exacerbation. - Continue home Breztri  and rescue inhaler.   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  Due to poor p.o. intake?  AKI resolved. - Hold diuretics as above -Recheck renal function in 1 week.  Iron  deficiency anemia: H&H relatively stable.  Anemia panel consistent with iron  deficiency. Recent Labs    03/04/24 1155 04/23/24 2355 04/24/24 0448 04/25/24 0514 04/26/24 0432 07/08/24 1255 07/09/24 0033 07/10/24 0350 07/11/24 0846  HGB 13.6 15.4 12.8* 12.7* 13.0 12.3* 12.8* 12.5* 12.0*  - Received IV Venofer  standard milligram x 1 before discharge.  Uncontrolled hypertension: Now normotensive. - Continue Toprol -XL 100 mg daily - Amlodipine  5 mg daily   BPH - Continue home Proscar  and Flomax    Bipolar 1 disorder: Depakote  therapeutic. -Continue home Depakote .   Pure hypercholesterolemia -Continue Lipitor 20 mg daily    Thrombocytopenia: Improved. - Recheck CBC in 1 week   Cognitive impairment:  He is awake but only oriented to self and place.  [name] reports some cognitive decline lately. - Consider neurology referral for formal evaluation.   Intention tremor: - Resume home beta-blocker.  Neurology follow-up outpatient.   Advance care planning: Admitted as full code.  Has MOST form that shows DNR-Limited.  [name] confirms DNR status. - Changed CODE STATUS to DNR.   Unintentional weight loss Body mass index is 23.26 kg/m. Nutrition Problem: Unintentional weight loss Etiology: acute illness Signs/Symptoms: other (comment) (-12.6% weight loss x 6 months) Interventions: Ensure Enlive (each supplement provides 350kcal and 20 grams of protein)     Consultations: None  Time spent 35  minutes  Vital signs Vitals:   07/11/24 0900 07/11/24 0924 07/11/24 0927 07/11/24 0932  BP:      Pulse: 65     Temp:      Resp: 18     Weight:      SpO2: 98% 95% (!) 88% 93%  TempSrc:         Discharge exam  GENERAL: No apparent distress.  Nontoxic. HEENT: MMM.  Vision and hearing grossly intact.  NECK: Supple.  No apparent JVD.  RESP:  No IWOB.  Fair aeration bilaterally.  Intermittent cough during exam. CVS:  RRR. Heart sounds normal.  ABD/GI/GU: BS+. Abd soft, NTND.  MSK/EXT:  Moves extremities. No apparent deformity. No edema.  SKIN: no apparent skin lesion or wound NEURO: Awake and alert. Oriented to self and place.  No apparent focal neuro deficit other than some tremors that is exaggerated with intention/movement. PSYCH: Calm. Normal affect.   Discharge Instructions Discharge Instructions     Increase activity slowly   Complete by: As directed       Allergies as of 07/11/2024   No Known Allergies      Medication List     STOP taking these medications    ADVIL PM PO   amiodarone  200 MG tablet Commonly known as: PACERONE    doxycycline  100 MG  tablet Commonly known as: VIBRA -TABS   furosemide  20 MG tablet Commonly known as: LASIX    Metoprolol  Tartrate 75 MG Tabs       TAKE these medications    albuterol  108 (90 Base) MCG/ACT inhaler Commonly known as: VENTOLIN  HFA INHALE 2 PUFFS BY MOUTH EVERY 6 HOURS AS NEEDED What changed:  how much to take when to take this   amLODipine  5 MG tablet Commonly known as: NORVASC  Take 1 tablet (5 mg total) by mouth daily. Start taking on: July 12, 2024   apixaban  5 MG Tabs tablet Commonly known as: Eliquis  Take 1 tablet (5 mg total) by mouth 2 (two) times daily.   atorvastatin  20 MG tablet Commonly known as: LIPITOR Take 20 mg by mouth at bedtime.   divalproex  500 MG 24 hr tablet Commonly known as: DEPAKOTE  ER Take 500 mg by mouth in the morning and at bedtime.   feeding supplement Liqd Take 237 mLs by mouth 2 (two) times daily between meals.   finasteride  5 MG tablet Commonly known as: PROSCAR  Take 5 mg by mouth at bedtime.   metoprolol  succinate 100 MG 24 hr  tablet Commonly known as: TOPROL -XL Take 1 tablet (100 mg total) by mouth daily. Take with or immediately following a meal. What changed: Another medication with the same name was removed. Continue taking this medication, and follow the directions you see here.   tamsulosin  0.4 MG Caps capsule Commonly known as: FLOMAX  Take 1 capsule (0.4 mg total) by mouth daily after supper. What changed:  how much to take when to take this   Trelegy Ellipta  100-62.5-25 MCG/ACT Aepb Generic drug: Fluticasone-Umeclidin-Vilant INHALE 1 PUFF ONCE DAILY . APPOINTMENT REQUIRED FOR FUTURE REFILLS         Procedures/Studies:   DG CHEST PORT 1 VIEW Result Date: 07/08/2024 CLINICAL DATA:  [id number] COVID-19 virus infection [id number] EXAM: PORTABLE CHEST 1 VIEW COMPARISON:  Chest x-ray 04/26/2024. FINDINGS: Left chest wall 4 lead cardiac pacemaker defibrillator. The heart and mediastinal contours are unchanged. Mitral  annular calcification. Atherosclerotic plaque. No focal consolidation. Chronic coarsened interstitial markings with no overt pulmonary edema. No pleural effusion. No pneumothorax. No acute osseous abnormality. IMPRESSION: 1. No active disease. 2. Aortic Atherosclerosis (ICD10-I70.0) including mitral annular calcification. Electronically Signed   By: Morgane  Naveau M.D.   On: 07/08/2024 20:48   CT Head Wo Contrast Result Date: 07/08/2024 EXAM: CT HEAD WITHOUT CONTRAST 07/08/2024 01:32:00 PM TECHNIQUE: CT of the head was performed without the administration of intravenous contrast. Automated exposure control, iterative reconstruction, and/or weight based adjustment of the mA/kV was utilized to reduce the radiation dose to as low as reasonably achievable. COMPARISON: CT head 04/24/2024. CLINICAL HISTORY: Head trauma, minor (Age >= 65y). No contrast; Pt bib [facility] coming from home with CC of weakness. Pt reportedly had a fall this morning and again earlier before EMS arrival. Pt fell on knees, with no loc/head injury. Pt is on eliquis . Pt denies pain. Pt alert and oriented x3, at baseline. Pt arrives ; in c-collar placed by EMS. FINDINGS: BRAIN AND VENTRICLES: No acute hemorrhage. No evidence of acute infarct. No hydrocephalus. No extra-axial collection. No mass effect or midline shift. Similar cerebral parenchymal volume loss. Nonspecific hypoattenuation in the periventricular and subcortical white matter, most likely representing chronic microvascular ischemic changes. Similar mineralization in the basal ganglia and cerebellum. ORBITS: Bilateral lens replacement. SINUSES: Moderate mucosal thickening in the ethmoid sinuses. Additional mucosal thickening in the sphenoid sinuses, left greater than right. SOFT TISSUES AND SKULL: No acute soft tissue abnormality. No skull fracture. VASCULATURE: Atherosclerosis of the carotid siphons and the intracranial vertebral arteries. IMPRESSION: 1. No acute intracranial  abnormality. Electronically signed by: Donnice Mania MD 07/08/2024 01:46 PM EDT RP Workstation: [id number]       The results of significant diagnostics from this hospitalization (including imaging, microbiology, ancillary and laboratory) are listed below for reference.     Microbiology: Recent Results (from the past 240 hours)  Resp panel by RT-PCR (RSV, Flu A&B, Covid) Anterior Nasal Swab     Status: Abnormal   Collection Time: 07/08/24 12:42 PM   Specimen: Anterior Nasal Swab  Result Value Ref Range Status   SARS Coronavirus 2 by RT PCR POSITIVE (A) NEGATIVE Final   Influenza A by PCR NEGATIVE NEGATIVE Final   Influenza B by PCR NEGATIVE NEGATIVE Final    Comment: (NOTE) The Xpert Xpress SARS-CoV-2/FLU/RSV plus assay is intended as an aid in the diagnosis of influenza from Nasopharyngeal swab specimens and should not be used as a sole basis for treatment. Nasal washings and aspirates are unacceptable for Xpert Xpress SARS-CoV-2/FLU/RSV testing.  Fact Sheet for Patients: BloggerCourse.com  Fact Sheet for Healthcare Providers: SeriousBroker.it  This test is not yet approved or cleared by the [place]  FDA and has been authorized for detection and/or diagnosis of SARS-CoV-2 by FDA under an Emergency Use Authorization (EUA). This EUA will remain in effect (meaning this test can be used) for the duration of the COVID-19 declaration under Section 564(b)(1) of the Act, 21 U.S.C. section 360bbb-3(b)(1), unless the authorization is terminated or revoked.     Resp Syncytial Virus by PCR NEGATIVE NEGATIVE Final    Comment: (NOTE) Fact Sheet for Patients: BloggerCourse.com  Fact Sheet for Healthcare Providers: SeriousBroker.it  This test is not yet approved or cleared by the [place]  FDA and has been authorized for detection and/or diagnosis of SARS-CoV-2 by FDA under an  Emergency Use Authorization (EUA). This EUA will remain in effect (meaning this test can be used) for the duration of the COVID-19 declaration under Section 564(b)(1) of the Act, 21 U.S.C. section 360bbb-3(b)(1), unless the authorization is terminated or revoked.  Performed at [facility], [address] [place], [place] [address]      Labs:  CBC: Recent Labs  Lab 07/08/24 1255 07/09/24 0033 07/10/24 0350 07/11/24 0846  WBC 4.7 4.2 4.6 4.1  HGB 12.3* 12.8* 12.5* 12.0*  HCT 38.5* 38.5* 38.5* 36.9*  MCV 91.0 87.9 88.9 89.8  PLT 98* 120* 111* 121*   BMP &GFR Recent Labs  Lab 07/08/24 1255 07/09/24 0033 07/10/24 0350 07/11/24 0846  NA 136 137 134* 137  K 3.9 4.1 4.2 4.0  CL 106 103 98 105  CO2 21* 24 22 27   GLUCOSE 87 94 84 94  BUN 25* 21 27* 22  CREATININE 1.38* 1.27* 1.61* 1.16  CALCIUM  9.4 9.5 9.4 9.0  MG  --  1.6* 1.9 1.7  PHOS  --  2.8 3.5 2.9   Estimated Creatinine Clearance: 47.5 mL/min (by C-G formula based on SCr of 1.16 mg/dL). Liver & Pancreas: Recent Labs  Lab 07/08/24 1255 07/09/24 0033 07/10/24 0350 07/11/24 0846  AST 25 26  --   --   ALT 16 16  --   --   ALKPHOS 44 47  --   --   BILITOT 0.4 1.1  --   --   PROT 6.1* 6.6  --   --   ALBUMIN 3.0* 3.3* 3.0* 2.6*   No results for input(s): LIPASE, AMYLASE in the last 168 hours. No results for input(s): AMMONIA in the last 168 hours. Diabetic: No results for input(s): HGBA1C in the last 72 hours. Recent Labs  Lab 07/08/24 1255 07/09/24 0743 07/10/24 0830  GLUCAP 80 102* 145*   Cardiac Enzymes: Recent Labs  Lab 07/09/24 0032  CKTOTAL 58   No results for input(s): PROBNP in the last 8760 hours. Coagulation Profile: No results for input(s): INR, PROTIME in the last 168 hours. Thyroid  Function Tests: Recent Labs    07/10/24 0350  TSH <0.100*  FREET4 1.83*   Lipid Profile: No results for input(s): CHOL, HDL, LDLCALC, TRIG, CHOLHDL, LDLDIRECT in the  last 72 hours. Anemia Panel: Recent Labs    07/09/24 0032 07/09/24 0033  VITAMINB12  --  582  FOLATE  --  >20.0  FERRITIN 104  --   TIBC 323  --   IRON  16*  --   RETICCTPCT 1.0  --    Urine analysis:    Component Value Date/Time   COLORURINE YELLOW 07/08/2024 1958   APPEARANCEUR CLEAR 07/08/2024 1958   LABSPEC 1.019 07/08/2024  1958   PHURINE 5.0 07/08/2024 1958   GLUCOSEU NEGATIVE 07/08/2024 1958   HGBUR NEGATIVE 07/08/2024 1958   BILIRUBINUR NEGATIVE 07/08/2024 1958   KETONESUR 5 (A) 07/08/2024 1958   PROTEINUR NEGATIVE 07/08/2024 1958   NITRITE NEGATIVE 07/08/2024 1958   LEUKOCYTESUR NEGATIVE 07/08/2024 1958   Sepsis Labs: Invalid input(s): PROCALCITONIN, LACTICIDVEN   SIGNED:  Tawan Degroote T Aubery Date, MD  [facility] 07/11/2024, 11:30 AM

## 2024-07-12 DIAGNOSIS — Z8679 Personal history of other diseases of the circulatory system: Secondary | ICD-10-CM | POA: Diagnosis not present

## 2024-07-12 DIAGNOSIS — N401 Enlarged prostate with lower urinary tract symptoms: Secondary | ICD-10-CM | POA: Diagnosis not present

## 2024-07-12 DIAGNOSIS — U071 COVID-19: Secondary | ICD-10-CM | POA: Diagnosis not present

## 2024-07-12 DIAGNOSIS — I472 Ventricular tachycardia, unspecified: Secondary | ICD-10-CM | POA: Diagnosis not present

## 2024-07-12 MED ORDER — ENSURE PLUS HIGH PROTEIN PO LIQD: 237.0000 mL | Freq: Two times a day (BID) | ORAL | Status: AC

## 2024-07-12 MED ORDER — METOPROLOL SUCCINATE ER 100 MG PO TB24: 100.0000 mg | ORAL_TABLET | Freq: Every day | ORAL | 2 refills | Status: AC

## 2024-07-12 NOTE — Progress Notes (Signed)
SATURATION QUALIFICATIONS: (This note is used to comply with regulatory documentation for home oxygen)  Patient Saturations on Room Air at Rest = 96%  Patient Saturations on Room Air while Ambulating = 91%  

## 2024-07-12 NOTE — TOC Progression Note (Addendum)
 Transition of Care [facility]) - Progression Note    Patient Details  Name: Mike Gray MRN: [id number] Date of Birth: 02-15-1941  Transition of Care [facility]) CM/SW Contact  Gwenn Frieze [place], [place] Phone Number: 07/12/2024, 9:52 AM  Clinical Narrative: Spoke to [name] [phone] re private pay cost for SNF as pt has traditional medicare with no qualifying hospital stay in the last 30 days. Pt's brother was unaware of private pay cost and reports they are unable to manage this. Discussed HH and private caregivers in the home and he is agreeable. Updated Starr at [facility] who also called pt's brother to explain private pay situation. MD and CM aware of change in dc plan.   Frieze Gwenn, MSW, LCSW [phone] (coverage)      Expected Discharge Plan: Skilled Nursing Facility Barriers to Discharge: Continued Medical Work up, SNF Pending bed offer               Expected Discharge Plan and Services In-house Referral: Clinical Social Work     Living arrangements for the past 2 months: Single Family Home Expected Discharge Date: 07/11/24                                     Social Drivers of Health (SDOH) Interventions SDOH Screenings   Food Insecurity: No Food Insecurity (07/09/2024)  Housing: Low Risk  (07/09/2024)  Transportation Needs: No Transportation Needs (07/09/2024)  Utilities: Not At Risk (07/09/2024)  Depression (PHQ2-9): Low Risk  (07/18/2022)  Social Connections: Socially Isolated (07/09/2024)  Tobacco Use: Medium Risk (07/08/2024)    Readmission Risk Interventions     No data to display

## 2024-07-12 NOTE — TOC Transition Note (Addendum)
 Transition of Care [facility]) - Discharge Note   Patient Details  Name: Mike Gray MRN: [id number] Date of Birth: [birth year]  [facility]) CM/SW Contact:  Marval Gell, RN Phone Number: 07/12/2024, 10:03 AM   Clinical Narrative:     [name] w patient's brother, Lamar,  on the phone. Discussed needs for home, he has WC RW cane at home, recently active w [facility], he would like to use them again. [facility] able to accept.  Lamar will come to the hospital and see if thinks if the patient can travel by car. If not please reach out to me and let me know so I can set up PTAR.  Awaiting to hear back from MD if he will order home oxygen  Per MD plan is to wean off oxygen and re ambulate, notified bedside nurse.    PTAR forms tubed to unit, brother to decide if he wants PTAR. Nurse instructed to either call, provided number, or ask me to call when ready.    Abdulkarim, Eberlin (Brother) [phone]      Barriers to Discharge: No Barriers Identified   Patient Goals and CMS Choice Patient states their goals for this hospitalization and ongoing recovery are:: Unable to assess CMS Medicare.gov Compare Post Acute Care list provided to:: Other (Comment Required) Choice offered to / list presented to : Sibling      Discharge Placement                       Discharge Plan and Services Additional resources added to the After Visit Summary for   In-house Referral: Clinical Social Work   Post Acute Care Choice: Home Health                    HH Arranged: PT, OT [facility] Agency: [facility] Date [facility] Agency Contacted: 07/12/24 Time HH Agency Contacted: [phone] Representative spoke with at [facility] Agency: [place]  Social Drivers of Health (SDOH) Interventions SDOH Screenings   Food Insecurity: No Food Insecurity (07/09/2024)  Housing: Low Risk  (07/09/2024)  Transportation Needs: No Transportation Needs (07/09/2024)  Utilities: Not At Risk (07/09/2024)  Depression  (PHQ2-9): Low Risk  (07/18/2022)  Social Connections: Socially Isolated (07/09/2024)  Tobacco Use: Medium Risk (07/08/2024)     Readmission Risk Interventions     No data to display

## 2024-07-12 NOTE — Progress Notes (Signed)
 Physical Therapy Treatment Patient Details Name: Mike Gray MRN: [id number] DOB: October 04, 1941 Today's Date: 07/12/2024   History of Present Illness Pt is an 83 y/o M admitted on 07/08/24 after presenting with c/o worsening debility & fatigue in the setting of recent COVID infection. PMH: COPD, complete AV block s/p dual-chamber ICD, chronic systolic CHF, a-fib, v-tach, CKD 3B, BPH, anemia, hypertrophic cardiomyopathy, HTN    PT Comments  Pt is presenting with a L lateral lean and pt brother reports that pt speech sounds slurred; RN and MD were notified. Pt was Min A to CGA for bed mobility, sit to stand and gait with RW. Brother reports that he is able to assist pt at this level but will not be able to pick pt up if he falls on the floor. Ideally pt would need SNF but due to insurance deficits based on insurance policies pt is unable to be admitted to SNF and is unable to pay out of pocket. Due to pt current functional status, home set up and available assistance at home recommending skilled physical therapy services 3x/week in order to address strength, balance and functional mobility to decrease risk for falls, injury and re-hospitalization.       If plan is discharge home, recommend the following: A little help with walking and/or transfers;A little help with bathing/dressing/bathroom;Assistance with cooking/housework;Assist for transportation;Help with stairs or ramp for entrance;Supervision due to cognitive status     Equipment Recommendations  None recommended by PT;Other (comment) (provided pt and brother with gait belt and urinal)       Precautions / Restrictions Precautions Precautions: Fall Recall of Precautions/Restrictions: Impaired Restrictions Weight Bearing Restrictions Per Provider Order: No     Mobility  Bed Mobility Overal bed mobility: Needs Assistance Bed Mobility: Supine to Sit, Sit to Supine     Supine to sit: Contact guard Sit to supine: Min assist   General  bed mobility comments: CGA with verbal cues intermittently for sequencing to get EOB. Min A for LE to get to supine.    Transfers Overall transfer level: Needs assistance Equipment used: Rolling walker (2 wheels) Transfers: Sit to/from Stand Sit to Stand: Min assist, Contact guard assist   Step pivot transfers: Min assist, Contact guard assist       General transfer comment: CGA for sit to stand from EOB, Min A for sit to stand from toilet. CGA to Min A with bowel urgency while ambulating to toilet. pt became very unsafe with AD placing it significantly anterior and tyring to sit way to far forward on toilet due to bowel urgency    Ambulation/Gait Ambulation/Gait assistance: Contact guard assist, Min assist Gait Distance (Feet): 200 Feet Assistive device: Rolling walker (2 wheels) Gait Pattern/deviations: Decreased step length - left, Decreased step length - right, Decreased stride length, Decreased dorsiflexion - right, Decreased dorsiflexion - left, Trunk flexed Gait velocity: decreased Gait velocity interpretation: <1.8 ft/sec, indicate of risk for recurrent falls   General Gait Details: decreased ability/awareness re: need to ambulate within base of AD especially when turning frequent cues, decreased awareness of need to square up to chair prior to sitting      Balance Overall balance assessment: Needs assistance Sitting-balance support: Feet supported Sitting balance-Leahy Scale: Poor   Postural control: Left lateral lean Standing balance support: Reliant on assistive device for balance, Bilateral upper extremity supported, During functional activity Standing balance-Leahy Scale: Poor Standing balance comment: CGA to Min A due to poor balance.      Communication Communication  Communication: Impaired Factors Affecting Communication: Hearing impaired  Cognition Arousal: Alert Behavior During Therapy: Flat affect   PT - Cognitive impairments: History of cognitive  impairments, Orientation, Awareness, Memory, Problem solving, Sequencing, Safety/Judgement, Attention, Initiation   Orientation impairments: Place, Time, Situation     Following commands: Impaired Following commands impaired: Follows one step commands with increased time, Follows multi-step commands inconsistently    Cueing Cueing Techniques: Verbal cues, Tactile cues, Visual cues     General Comments General comments (skin integrity, edema, etc.): Brother in room during tx session; states he is able to give assist at the level pt is requiring it at this time. Pt was leaning to the L while sitting in bed requiring intermittent MIn A to CGA and Leaning to the L while sitting on the toilet. Mild L lean when standing; RN, MD and case management notified.      Pertinent Vitals/Pain Pain Assessment Pain Assessment: No/denies pain     PT Goals (current goals can now be found in the care plan section) Acute Rehab PT Goals PT Goal Formulation: Patient unable to participate in goal setting Time For Goal Achievement: 07/23/24 Potential to Achieve Goals: Good Progress towards PT goals: Progressing toward goals    Frequency    Min 2X/week      PT Plan  Continue with current POC        AM-PAC PT 6 Clicks Mobility   Outcome Measure  Help needed turning from your back to your side while in a flat bed without using bedrails?: A Little Help needed moving from lying on your back to sitting on the side of a flat bed without using bedrails?: A Little Help needed moving to and from a bed to a chair (including a wheelchair)?: A Little Help needed standing up from a chair using your arms (e.g., wheelchair or bedside chair)?: A Little Help needed to walk in hospital room?: A Little Help needed climbing 3-5 steps with a railing? : A Lot 6 Click Score: 17    End of Session Equipment Utilized During Treatment: Gait belt Activity Tolerance: Patient limited by fatigue Patient left: in  bed;with call bell/phone within reach;with bed alarm set Nurse Communication: Mobility status PT Visit Diagnosis: Unsteadiness on feet (R26.81);History of falling (Z91.81);Muscle weakness (generalized) (M62.81);Other abnormalities of gait and mobility (R26.89);Difficulty in walking, not elsewhere classified (R26.2)     Time: 8668-8586 PT Time Calculation (min) (ACUTE ONLY): 42 min  Charges:    $Therapeutic Activity: 38-52 mins PT General Charges $$ ACUTE PT VISIT: 1 Visit                    Dorothyann Maier, DPT, CLT  Acute Rehabilitation Services Office: [phone] (Secure chat preferred)    Dorothyann VEAR Maier 07/12/2024, 4:18 PM

## 2024-07-12 NOTE — Discharge Summary (Signed)
 Physician Discharge Summary  Mike Gray FMW:[id number] DOB: 03-11-1941 DOA: 07/08/2024  PCP: No primary care provider on file.  Admit date: 07/08/2024 Discharge date: 07/12/24  Admitted From: Home Disposition: Home. Recommendations for Outpatient Follow-up:  Outpatient follow-up with PCP in 1 to 2 weeks. Ambulatory referral to neurology for tremor. Check CMP and CBC in 1 week Please follow up on the following pending results: None  Home health: [facility] PT/OT/RN/aide DME: As wheelchair, walker and cane.  Discharge Condition: Stable CODE STATUS: DNR Diet Orders (From admission, onward)     Start     Ordered   07/08/24 2336  Diet Heart Room service appropriate? Yes; Fluid consistency: Thin  Diet effective now       Question Answer Comment  Room service appropriate? Yes   Fluid consistency: Thin      07/08/24 2335             Follow-up Information     [facility] Follow up.   Specialty: Home Health Services Why: For home health services, they will call you to set up your home visits Contact information: [address] [place] [address] [phone]                 Hospital course 83 year old M with PMH of COPD, HFrEF/VT s/p dual-chamber ICD, hypertrophic cardiomyopathy, PAF on warfarin, CKD-3B, BPH, HTN and anemia presenting with generalized weakness/fatigue and dyspnea with exertion and found to have COVID-19 infection.  Reportedly had COVID and flu vaccine on 9/13.  [name] with respiratory symptoms as well.   In ED, slightly elevated BP on arrival but improved quickly.  No oxygen requirement.  CMP and CBC without significant finding other than thrombocytopenia.  Pro-Cal and lactic acid normal.  COVID-19 PCR positive.  UA unrevealing.  CT head without acute finding.  CXR without infiltrate.  Admitted for physical deconditioning the setting of COVID infection.    Further workup revealed hyperthyroidism and AKI.  He has  undetectable TSH and elevated free T4 to 1.81.  Home amiodarone  discontinued.  Home Toprol  increased.   AKI resolved with IV fluid.   Patient had brief oxygen requirement of 1L with ambulation on room air that has resolved on the day of discharge.  He maintained normal saturation with ambulation on room air.  Therapy recommended SNF.  Per TOC, SNF will be private pay because patient is observation status with traditional Medicare.  Unfortunately, patient and family not able to afford.  He is discharged home with home health and DME.  See individual problem list below for more.   Problems addressed during this hospitalization COVID-19 infection: Reportedly had DOE.  Some rhonchi and cough on exam.  No respiratory distress or oxygen requirement on admission. No infiltrate on chest x-ray.  Had brief oxygen requirement that has resolved. - Supportive care with bronchodilators and incentive spirometry  - Precaution for 5 to 6 days.   Generalized weakness/physical deconditioning: Could be due to COVID-19 infection.  Undetectable TSH with elevated free T4.  He is on amiodarone  for A-fib. - Supportive care for COVID-19 as above - Amiodarone  discontinued. - HHPT/OT/RN/aide ordered.  Has appropriate DME's.   Chronic HFrEF/history of VT/HCM s/p dual-chamber AICD.  Appears euvolemic on exam.  On p.o. Lasix  20 mg daily at home. - Continue home Lasix . - Continue Toprol -XL at 100 mg daily   Paroxysmal A-fib/CHB s/p pacemaker.  On amiodarone , Toprol -XL and Eliquis  at home. - Discontinued amiodarone  due to hyperthyroidism. - Home  Toprol  XL 100 mg daily. - Continue home Eliquis .   Chronic COPD: Without exacerbation. - Continue home Breztri  and rescue inhaler.   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  Due to poor p.o. intake?  AKI resolved. -Recheck renal function in 1 week.  Iron  deficiency anemia: H&H relatively stable.  Anemia panel consistent with iron  deficiency. Recent Labs    03/04/24 1155  04/23/24 2355 04/24/24 0448 04/25/24 0514 04/26/24 0432 07/08/24 1255 07/09/24 0033 07/10/24 0350 07/11/24 0846  HGB 13.6 15.4 12.8* 12.7* 13.0 12.3* 12.8* 12.5* 12.0*  - Received IV Venofer  300 mg x 1 prior to discharge. - Recheck CBC in 1 week    Uncontrolled hypertension: Now normotensive. - Continue Toprol -XL 100 mg daily   BPH - Continue home Proscar  and Flomax    Bipolar 1 disorder: Depakote  level therapeutic. -Continue home Depakote .   Pure hypercholesterolemia -Continue Lipitor 20 mg daily   Thrombocytopenia: Improved. - Recheck CBC in 1 week   Cognitive impairment:  He is awake but only oriented to self and place.  [name] reports some cognitive decline lately. - Consider neurology referral for formal evaluation.   Intention tremor: - Resume home beta-blocker.  Neurology follow-up outpatient.   Advance care planning: Admitted as full code.  Has MOST form that shows DNR-Limited.  [name] confirms DNR status. - Changed CODE STATUS to DNR.   Unintentional weight loss Body mass index is 23.26 kg/m. Nutrition Problem: Unintentional weight loss Etiology: acute illness Signs/Symptoms: other (comment) (-12.6% weight loss x 6 months) Interventions: Ensure Enlive (each supplement provides 350kcal and 20 grams of protein)     Consultations: None  Time spent 35  minutes  Vital signs Vitals:   07/11/24 2028 07/12/24 0354 07/12/24 0545 07/12/24 0819  BP: 115/80  131/83 116/65  Pulse: 66  76 70  Temp: 97.6 F (36.4 C)  98 F (36.7 C) 98 F (36.7 C)  Resp: 16  20   Height:  5' 8 (1.727 m)    Weight:      SpO2: 98%  96% 98%  TempSrc: Oral  Oral Oral     Discharge exam  GENERAL: No apparent distress.  Nontoxic. HEENT: MMM.  Vision and hearing grossly intact.  NECK: Supple.  No apparent JVD.  RESP:  No IWOB.  Fair aeration bilaterally.  Intermittent cough during exam. CVS:  RRR. Heart sounds normal.  ABD/GI/GU: BS+. Abd soft, NTND.   MSK/EXT:  Moves extremities. No apparent deformity. No edema.  SKIN: no apparent skin lesion or wound NEURO: Awake and alert.  Oriented to self, place and family. Speech clear.  No facial droop.  Cranial nerves II-XII intact. Motor intact and symmetric in all extremities.  Light sensation intact in all dermatomes. Patellar reflex symmetric.  No pronator drift.  Finger to nose intact.  No nystagmus. PSYCH: Calm. Normal affect.   Discharge Instructions Discharge Instructions     Increase activity slowly   Complete by: As directed       Allergies as of 07/12/2024   No Known Allergies      Medication List     PAUSE taking these medications    furosemide  20 MG tablet Wait to take this until: July 16, 2024 Commonly known as: LASIX  Take 1 tablet (20 mg total) by mouth daily as needed for edema.       STOP taking these medications    ADVIL PM PO   amiodarone  200 MG tablet Commonly known as: PACERONE    doxycycline  100 MG  tablet Commonly known as: VIBRA -TABS   Metoprolol  Tartrate 75 MG Tabs       TAKE these medications    albuterol  108 (90 Base) MCG/ACT inhaler Commonly known as: VENTOLIN  HFA INHALE 2 PUFFS BY MOUTH EVERY 6 HOURS AS NEEDED What changed:  how much to take when to take this   apixaban  5 MG Tabs tablet Commonly known as: Eliquis  Take 1 tablet (5 mg total) by mouth 2 (two) times daily.   atorvastatin  20 MG tablet Commonly known as: LIPITOR Take 20 mg by mouth at bedtime.   divalproex  500 MG 24 hr tablet Commonly known as: DEPAKOTE  ER Take 500 mg by mouth in the morning and at bedtime.   feeding supplement Liqd Take 237 mLs by mouth 2 (two) times daily between meals.   finasteride  5 MG tablet Commonly known as: PROSCAR  Take 5 mg by mouth at bedtime.   metoprolol  succinate 100 MG 24 hr tablet Commonly known as: TOPROL -XL Take 1 tablet (100 mg total) by mouth daily. Take with or immediately following a meal. What changed: Another  medication with the same name was removed. Continue taking this medication, and follow the directions you see here.   tamsulosin  0.4 MG Caps capsule Commonly known as: FLOMAX  Take 1 capsule (0.4 mg total) by mouth daily after supper. What changed:  how much to take when to take this   Trelegy Ellipta  100-62.5-25 MCG/ACT Aepb Generic drug: Fluticasone-Umeclidin-Vilant INHALE 1 PUFF ONCE DAILY . APPOINTMENT REQUIRED FOR FUTURE REFILLS         Procedures/Studies:   DG CHEST PORT 1 VIEW Result Date: 07/08/2024 CLINICAL DATA:  [id number] COVID-19 virus infection [id number] EXAM: PORTABLE CHEST 1 VIEW COMPARISON:  Chest x-ray 04/26/2024. FINDINGS: Left chest wall 4 lead cardiac pacemaker defibrillator. The heart and mediastinal contours are unchanged. Mitral annular calcification. Atherosclerotic plaque. No focal consolidation. Chronic coarsened interstitial markings with no overt pulmonary edema. No pleural effusion. No pneumothorax. No acute osseous abnormality. IMPRESSION: 1. No active disease. 2. Aortic Atherosclerosis (ICD10-I70.0) including mitral annular calcification. Electronically Signed   By: Morgane  Naveau M.D.   On: 07/08/2024 20:48   CT Head Wo Contrast Result Date: 07/08/2024 EXAM: CT HEAD WITHOUT CONTRAST 07/08/2024 01:32:00 PM TECHNIQUE: CT of the head was performed without the administration of intravenous contrast. Automated exposure control, iterative reconstruction, and/or weight based adjustment of the mA/kV was utilized to reduce the radiation dose to as low as reasonably achievable. COMPARISON: CT head 04/24/2024. CLINICAL HISTORY: Head trauma, minor (Age >= 65y). No contrast; Pt bib [facility] coming from home with CC of weakness. Pt reportedly had a fall this morning and again earlier before EMS arrival. Pt fell on knees, with no loc/head injury. Pt is on eliquis . Pt denies pain. Pt alert and oriented x3, at baseline. Pt arrives ; in c-collar placed by EMS. FINDINGS: BRAIN  AND VENTRICLES: No acute hemorrhage. No evidence of acute infarct. No hydrocephalus. No extra-axial collection. No mass effect or midline shift. Similar cerebral parenchymal volume loss. Nonspecific hypoattenuation in the periventricular and subcortical white matter, most likely representing chronic microvascular ischemic changes. Similar mineralization in the basal ganglia and cerebellum. ORBITS: Bilateral lens replacement. SINUSES: Moderate mucosal thickening in the ethmoid sinuses. Additional mucosal thickening in the sphenoid sinuses, left greater than right. SOFT TISSUES AND SKULL: No acute soft tissue abnormality. No skull fracture. VASCULATURE: Atherosclerosis of the carotid siphons and the intracranial vertebral arteries. IMPRESSION: 1. No acute intracranial abnormality. Electronically signed by: Donnice Mania MD 07/08/2024 01:46 PM  EDT RP Workstation: [id number]       The results of significant diagnostics from this hospitalization (including imaging, microbiology, ancillary and laboratory) are listed below for reference.     Microbiology: Recent Results (from the past 240 hours)  Resp panel by RT-PCR (RSV, Flu A&B, Covid) Anterior Nasal Swab     Status: Abnormal   Collection Time: 07/08/24 12:42 PM   Specimen: Anterior Nasal Swab  Result Value Ref Range Status   SARS Coronavirus 2 by RT PCR POSITIVE (A) NEGATIVE Final   Influenza A by PCR NEGATIVE NEGATIVE Final   Influenza B by PCR NEGATIVE NEGATIVE Final    Comment: (NOTE) The Xpert Xpress SARS-CoV-2/FLU/RSV plus assay is intended as an aid in the diagnosis of influenza from Nasopharyngeal swab specimens and should not be used as a sole basis for treatment. Nasal washings and aspirates are unacceptable for Xpert Xpress SARS-CoV-2/FLU/RSV testing.  Fact Sheet for Patients: BloggerCourse.com  Fact Sheet for Healthcare Providers: SeriousBroker.it  This test is not yet approved or  cleared by the [place]  FDA and has been authorized for detection and/or diagnosis of SARS-CoV-2 by FDA under an Emergency Use Authorization (EUA). This EUA will remain in effect (meaning this test can be used) for the duration of the COVID-19 declaration under Section 564(b)(1) of the Act, 21 U.S.C. section 360bbb-3(b)(1), unless the authorization is terminated or revoked.     Resp Syncytial Virus by PCR NEGATIVE NEGATIVE Final    Comment: (NOTE) Fact Sheet for Patients: BloggerCourse.com  Fact Sheet for Healthcare Providers: SeriousBroker.it  This test is not yet approved or cleared by the [place]  FDA and has been authorized for detection and/or diagnosis of SARS-CoV-2 by FDA under an Emergency Use Authorization (EUA). This EUA will remain in effect (meaning this test can be used) for the duration of the COVID-19 declaration under Section 564(b)(1) of the Act, 21 U.S.C. section 360bbb-3(b)(1), unless the authorization is terminated or revoked.  Performed at [facility], [address], [place], [place] [address]      Labs:  CBC: Recent Labs  Lab 07/08/24 1255 07/09/24 0033 07/10/24 0350 07/11/24 0846  WBC 4.7 4.2 4.6 4.1  HGB 12.3* 12.8* 12.5* 12.0*  HCT 38.5* 38.5* 38.5* 36.9*  MCV 91.0 87.9 88.9 89.8  PLT 98* 120* 111* 121*   BMP &GFR Recent Labs  Lab 07/08/24 1255 07/09/24 0033 07/10/24 0350 07/11/24 0846  NA 136 137 134* 137  K 3.9 4.1 4.2 4.0  CL 106 103 98 105  CO2 21* 24 22 27   GLUCOSE 87 94 84 94  BUN 25* 21 27* 22  CREATININE 1.38* 1.27* 1.61* 1.16  CALCIUM  9.4 9.5 9.4 9.0  MG  --  1.6* 1.9 1.7  PHOS  --  2.8 3.5 2.9   Estimated Creatinine Clearance: 47.5 mL/min (by C-G formula based on SCr of 1.16 mg/dL). Liver & Pancreas: Recent Labs  Lab 07/08/24 1255 07/09/24 0033 07/10/24 0350 07/11/24 0846  AST 25 26  --   --   ALT 16 16  --   --   ALKPHOS 44 47  --   --    BILITOT 0.4 1.1  --   --   PROT 6.1* 6.6  --   --   ALBUMIN 3.0* 3.3* 3.0* 2.6*   No results for input(s): LIPASE, AMYLASE in the last 168 hours. No results for input(s): AMMONIA in the last 168 hours. Diabetic: No results for input(s): HGBA1C in the last 72 hours.  Recent Labs  Lab 07/08/24 1255 07/09/24 0743 07/10/24 0830  GLUCAP 80 102* 145*   Cardiac Enzymes: Recent Labs  Lab 07/09/24 0032  CKTOTAL 58   No results for input(s): PROBNP in the last 8760 hours. Coagulation Profile: No results for input(s): INR, PROTIME in the last 168 hours. Thyroid  Function Tests: Recent Labs    07/10/24 0350  TSH <0.100*  FREET4 1.83*   Lipid Profile: No results for input(s): CHOL, HDL, LDLCALC, TRIG, CHOLHDL, LDLDIRECT in the last 72 hours. Anemia Panel: No results for input(s): VITAMINB12, FOLATE, FERRITIN, TIBC, IRON , RETICCTPCT in the last 72 hours.  Urine analysis:    Component Value Date/Time   COLORURINE YELLOW 07/08/2024 1958   APPEARANCEUR CLEAR 07/08/2024 1958   LABSPEC 1.019 07/08/2024 1958   PHURINE 5.0 07/08/2024 1958   GLUCOSEU NEGATIVE 07/08/2024 1958   HGBUR NEGATIVE 07/08/2024 1958   BILIRUBINUR NEGATIVE 07/08/2024 1958   KETONESUR 5 (A) 07/08/2024 1958   PROTEINUR NEGATIVE 07/08/2024 1958   NITRITE NEGATIVE 07/08/2024 1958   LEUKOCYTESUR NEGATIVE 07/08/2024 1958   Sepsis Labs: Invalid input(s): PROCALCITONIN, LACTICIDVEN   SIGNED:  Hortense Cantrall T Makar Slatter, MD  [facility] 07/12/2024, 3:21 PM

## 2024-07-14 ENCOUNTER — Telehealth: Payer: Self-pay | Admitting: Podiatry

## 2024-07-14 ENCOUNTER — Telehealth: Payer: Self-pay | Admitting: Internal Medicine

## 2024-07-14 NOTE — Telephone Encounter (Signed)
 Called and left message for patient to contact office to confirm appointment cancellation. If no response by EOD will cancel appointment.

## 2024-07-14 NOTE — Telephone Encounter (Signed)
 Pt brother called in asking if someone can review hospital notes from this week and medications changes and advise if Dr. Santo is in agreement with changes made.

## 2024-07-15 ENCOUNTER — Ambulatory Visit: Admitting: Podiatry

## 2024-07-15 ENCOUNTER — Encounter: Payer: Self-pay | Admitting: Internal Medicine

## 2024-07-15 NOTE — Telephone Encounter (Signed)
 Called spoke with pt brother ok per DPR.  Advised of MD response.  Pt to f/u with PCP ([facility]) to manage thyroid  disease.  Will need f/u with [facility] or EP once thyroid  function is addressed.   Also reviewed instructions for metoprolol .  Brother expresses understanding.  BP today while home health present 116/70-60-95%.

## 2024-07-15 NOTE — Telephone Encounter (Signed)
 Called [facility] left a message regarding pt needed f/u for thyroid .  Advised to call our office if needed.

## 2024-07-15 NOTE — Telephone Encounter (Signed)
 [facility] called back to inform our office that pt is set up for Hospital f/u visit on 07/22/24.  Will obtain labs at that visit.  Will contact our office if anything further is needed.

## 2024-07-17 ENCOUNTER — Encounter: Payer: Self-pay | Admitting: Internal Medicine

## 2024-07-18 ENCOUNTER — Ambulatory Visit (INDEPENDENT_AMBULATORY_CARE_PROVIDER_SITE_OTHER): Payer: Medicare Other

## 2024-07-18 DIAGNOSIS — I442 Atrioventricular block, complete: Secondary | ICD-10-CM

## 2024-07-19 ENCOUNTER — Ambulatory Visit: Payer: Self-pay | Admitting: Cardiology

## 2024-07-19 LAB — CUP PACEART REMOTE DEVICE CHECK
Battery Remaining Longevity: 77 mo
Battery Voltage: 3 V
Brady Statistic AP VP Percent: 98.35 %
Brady Statistic AP VS Percent: 0.19 %
Brady Statistic AS VP Percent: 1.45 %
Brady Statistic AS VS Percent: 0.01 %
Brady Statistic RA Percent Paced: 98.36 %
Brady Statistic RV Percent Paced: 99.18 %
Date Time Interrogation Session: 20250926001605
HighPow Impedance: 68 Ohm
Implantable Lead Connection Status: 753985
Implantable Lead Connection Status: 753985
Implantable Lead Implant Date: 20070328
Implantable Lead Implant Date: 20150323
Implantable Lead Location: 753859
Implantable Lead Location: 753860
Implantable Lead Model: 5076
Implantable Pulse Generator Implant Date: 20230928
Lead Channel Impedance Value: 285 Ohm
Lead Channel Impedance Value: 342 Ohm
Lead Channel Impedance Value: 456 Ohm
Lead Channel Pacing Threshold Amplitude: 0.75 V
Lead Channel Pacing Threshold Amplitude: 0.75 V
Lead Channel Pacing Threshold Pulse Width: 0.4 ms
Lead Channel Pacing Threshold Pulse Width: 0.4 ms
Lead Channel Sensing Intrinsic Amplitude: 0.5 mV
Lead Channel Sensing Intrinsic Amplitude: 0.5 mV
Lead Channel Sensing Intrinsic Amplitude: 8.75 mV
Lead Channel Sensing Intrinsic Amplitude: 8.75 mV
Lead Channel Setting Pacing Amplitude: 1.5 V
Lead Channel Setting Pacing Amplitude: 2 V
Lead Channel Setting Pacing Pulse Width: 0.4 ms
Lead Channel Setting Sensing Sensitivity: 0.3 mV
Zone Setting Status: 755011
Zone Setting Status: 755011

## 2024-07-21 NOTE — Telephone Encounter (Signed)
 Called spoke to  patient's brother Charlie. Appointment schedule for 07/22/24 at 1:15 .  Verbalized understanding  and aware of new address.

## 2024-07-21 NOTE — Progress Notes (Unsigned)
 Cardiology Office Note:  .   Date:  07/22/2024  ID:  Mike Gray, DOB Jun 02, 1941, MRN [id number] PCP: Patient, No Pcp Per  [facility] Providers Cardiologist:  Stanly DELENA Leavens, MD Electrophysiologist:  Will Gladis Norton, MD    History of Present Illness: .   Mike Gray is a 83 y.o. male   with PMH of HCM, NSVT, CHB s/p ICD (VT and HCM), PAF (on Eliquis ), HTN, CHF and COPD, Bipolar disorder.  Patient discharged 07/12/24 after admission for covid 19 infection, generalized weakness and deconditioning. TSH undetectable with elevated free T4 on amiodarone  for Afib. Amiodarone  was stopped and asked to f/u here.  Patient here with his son. He's concerned about his weight loss~ 30 lbs. He's very weak and prone to fall. Patient has productive cough/COPD but heart ok.   ROS:    Studies Reviewed: SABRA         Prior CV Studies:   Echo 04/2024 IMPRESSIONS     1. Left ventricular ejection fraction, by estimation, is 30 to 35%. The  left ventricle has moderately decreased function. The left ventricle  demonstrates global hypokinesis. The left ventricular internal cavity size  was severely dilated. There is  moderate concentric left ventricular hypertrophy. Left ventricular  diastolic function could not be evaluated.   2. Right ventricular systolic function is normal. The right ventricular  size is normal. There is normal pulmonary artery systolic pressure.   3. Left atrial size was severely dilated.   4. Right atrial size was moderately dilated.   5. The mitral valve is degenerative. Trivial mitral valve regurgitation.  No evidence of mitral stenosis.   6. The aortic valve is grossly normal. There is moderate calcification of  the aortic valve. There is mild thickening of the aortic valve. Aortic  valve regurgitation is not visualized. Aortic valve  sclerosis/calcification is present, without any evidence   of aortic stenosis.   7. The inferior vena cava is dilated in  size with >50% respiratory  variability, suggesting right atrial pressure of 8 mmHg.    Risk Assessment/Calculations:    CHA2DS2-VASc Score = 4   This indicates a 4.8% annual risk of stroke. The patient's score is based upon: CHF History: 1 HTN History: 1 Diabetes History: 0 Stroke History: 0 Vascular Disease History: 0 Age Score: 2 Gender Score: 0            Physical Exam:   VS:  BP 108/60 (BP Location: Left Arm, Patient Position: Sitting, Cuff Size: Normal)   Pulse 69   Ht 5' 7 (1.702 m)   Wt 148 lb 6.4 oz (67.3 kg)   SpO2 96%   BMI 23.24 kg/m    Orhtostatics: No data found. Wt Readings from Last 3 Encounters:  07/22/24 148 lb 6.4 oz (67.3 kg)  07/11/24 153 lb (69.4 kg)  04/23/24 171 lb 15.3 oz (78 kg)    GEN: Thin, elderly in no acute distress NECK: No JVD; No carotid bruits CARDIAC:  RRR, no murmurs, rubs, gallops RESPIRATORY:  decreased breath sounds with productive loose cough, rhochi ABDOMEN: Soft, non-tender, non-distended EXTREMITIES:  No edema; No deformity   ASSESSMENT AND PLAN: .    Hyperthyroidism with undetectable TSH and elevated T4 likely due to amiodarone  now off. Will recheck thyroid  levels in 6-8 weeks and have him f/u with Dr. Norton   Paroxysmal atrial fibrillation: Currently on Eliquis .  CHA2DS2-VASc of 3.  Was on amiodarone  which was stopped due to undetectable TSH and elevated free  T4.  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 On lasix  prn.  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  Device functioning appropriately.  Currently on Toprol -XL.  Impedance, threshold, sensing within normal limits.  No changes.     Hypertension: Currently well-controlled on metoprolol . BP on low side, would not take amlodipine (he was given Rx in hospital)   Nonsustained VT: Minimal on last interrogation.  On metoprolol . No changes.   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  Device functioning  appropriately.  Weight loss ~ 30 lbs since May likely a combination of hyperthyroidsim, several hospitalizations for pneumonia/covid19   Covid 19 infection still with productive cough-recommend muccinex and f/u with pulmonary.          Dispo: f/u with Dr. Inocencio in Nov.  Signed, Olivia Pavy, PA-C

## 2024-07-22 ENCOUNTER — Encounter: Payer: Self-pay | Admitting: Physician Assistant

## 2024-07-22 ENCOUNTER — Ambulatory Visit: Attending: Physician Assistant | Admitting: Physician Assistant

## 2024-07-22 VITALS — BP 108/60 | HR 69 | Ht 67.0 in | Wt 148.4 lb

## 2024-07-22 DIAGNOSIS — U071 COVID-19: Secondary | ICD-10-CM | POA: Insufficient documentation

## 2024-07-22 DIAGNOSIS — I10 Essential (primary) hypertension: Secondary | ICD-10-CM | POA: Insufficient documentation

## 2024-07-22 DIAGNOSIS — I480 Paroxysmal atrial fibrillation: Secondary | ICD-10-CM | POA: Diagnosis present

## 2024-07-22 DIAGNOSIS — I5022 Chronic systolic (congestive) heart failure: Secondary | ICD-10-CM | POA: Insufficient documentation

## 2024-07-22 DIAGNOSIS — R634 Abnormal weight loss: Secondary | ICD-10-CM | POA: Insufficient documentation

## 2024-07-22 DIAGNOSIS — I442 Atrioventricular block, complete: Secondary | ICD-10-CM | POA: Diagnosis present

## 2024-07-22 DIAGNOSIS — I422 Other hypertrophic cardiomyopathy: Secondary | ICD-10-CM | POA: Insufficient documentation

## 2024-07-22 DIAGNOSIS — I472 Ventricular tachycardia, unspecified: Secondary | ICD-10-CM | POA: Insufficient documentation

## 2024-07-22 DIAGNOSIS — E0580 Other thyrotoxicosis without thyrotoxic crisis or storm: Secondary | ICD-10-CM | POA: Insufficient documentation

## 2024-07-22 NOTE — Patient Instructions (Signed)
 Medication Instructions:  NO CHANGES  Lab Work: TSH AND T4 FREE TO BE DONE TODAY.   Testing/Procedures: NONE  Follow-Up: At [facility], you and your health needs are our priority.  As part of our continuing mission to provide you with exceptional heart care, our providers are all part of one team.  This team includes your primary Cardiologist (physician) and Advanced Practice Providers or APPs (Physician Assistants and Nurse Practitioners) who all work together to provide you with the care you need, when you need it.  Your next appointment:   2 MONTHS (END OF NOVEMBER)  Provider:   DR. INOCENCIO, MD

## 2024-07-23 ENCOUNTER — Ambulatory Visit: Admitting: Primary Care

## 2024-07-23 ENCOUNTER — Ambulatory Visit: Payer: Self-pay | Admitting: Physician Assistant

## 2024-07-23 DIAGNOSIS — Z79899 Other long term (current) drug therapy: Secondary | ICD-10-CM

## 2024-07-23 LAB — T4, FREE: Free T4: 2.92 ng/dL — ABNORMAL HIGH (ref 0.82–1.77)

## 2024-07-23 LAB — TSH: TSH: 0.01 u[IU]/mL — ABNORMAL LOW (ref 0.450–4.500)

## 2024-07-23 NOTE — Progress Notes (Signed)
 Remote ICD Transmission

## 2024-07-27 ENCOUNTER — Other Ambulatory Visit: Payer: Self-pay | Admitting: Internal Medicine

## 2024-07-27 DIAGNOSIS — J439 Emphysema, unspecified: Secondary | ICD-10-CM

## 2024-08-06 ENCOUNTER — Ambulatory Visit: Admitting: Internal Medicine

## 2024-08-06 ENCOUNTER — Encounter: Payer: Self-pay | Admitting: Internal Medicine

## 2024-08-06 VITALS — BP 114/72 | HR 71 | Temp 98.1°F | Ht 67.0 in | Wt 145.0 lb

## 2024-08-06 DIAGNOSIS — Z7901 Long term (current) use of anticoagulants: Secondary | ICD-10-CM

## 2024-08-06 DIAGNOSIS — I4891 Unspecified atrial fibrillation: Secondary | ICD-10-CM

## 2024-08-06 DIAGNOSIS — J4489 Other specified chronic obstructive pulmonary disease: Secondary | ICD-10-CM | POA: Diagnosis not present

## 2024-08-06 DIAGNOSIS — I5022 Chronic systolic (congestive) heart failure: Secondary | ICD-10-CM | POA: Diagnosis not present

## 2024-08-06 DIAGNOSIS — Z950 Presence of cardiac pacemaker: Secondary | ICD-10-CM

## 2024-08-06 DIAGNOSIS — J9611 Chronic respiratory failure with hypoxia: Secondary | ICD-10-CM

## 2024-08-06 NOTE — Patient Instructions (Signed)
 It was a pleasure to see you today!  Please schedule follow up with Tammy Parrett in 4 months.  If my schedule is not open yet, we will contact you with a reminder closer to that time. Please call [phone] if you haven't heard from us  a month before, and always call us  sooner if issues or concerns arise. You can also send us  a message through MyChart, but but aware that this is not to be used for urgent issues and it may take up to 5-7 days to receive a reply. Please be aware that you will likely be able to view your results before I have a chance to respond to them. Please give us  5 business days to respond to any non-urgent results.    You can measure your oxygen levels while exercising with physical therapy - give me a call or send a message if dropping consistently below 89%.   Continue trelegy 1 puff once daily, gargle after use.  Continue albuterol  inhaler as needed.   Continue to stay active

## 2024-08-06 NOTE — Progress Notes (Signed)
 Mike Gray    [id number]    06-22-1941  Primary Care Physician:Patient, No Pcp Per Date of Appointment: 08/06/2024 Established Patient Visit  Chief complaint:   Chief Complaint  Patient presents with   COPD    Patient states his breathing is fine      HPI: Mike Gray is a 83 y.o. 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 On trelegy. S/p pulmonary rehab. Cognitive Decline. Lives at home with his brother.   Interval Updates: Here for COPD follow up.  Here with his brother.   Hospitalized twice since last appointment for pneumonia and covid. Went to rehab first then home. Getting home PT and nursing. Has been discharged with home oxygen.   On trelegy inhaler. Prn albuterol  which is fairly minimal.   Appetite is ok. No fevers chills. Stable cough.   Able to complete ADLs.   I have reviewed the patient's family social and past medical history and updated as appropriate.   Past Medical History:  Diagnosis Date   Angiodysplasia    Atrial fibrillation (HCC)    CHF (congestive heart failure) (HCC)    COPD (chronic obstructive pulmonary disease) (HCC)    GI bleed    Hypertension    Presence of permanent cardiac pacemaker     Past Surgical History:  Procedure Laterality Date   COLONOSCOPY     ICD GENERATOR CHANGEOUT N/A 07/20/2022   Procedure: ICD GENERATOR CHANGEOUT;  Surgeon: Inocencio Soyla Lunger, MD;  Location: [facility] INVASIVE CV LAB;  Service: Cardiovascular;  Laterality: N/A;   INGUINAL HERNIA REPAIR Right 12/14/2022   Procedure: OPEN RIGHT HERNIA REPAIR INGUINAL ADULT WITH MESH;  Surgeon: Rubin Calamity, MD;  Location[facility];  Service: General;  Laterality: Right;   PACEMAKER IMPLANT      Family History  Problem Relation Age of Onset   Healthy Mother    Breast cancer Father 28   Cancer Other     Social History   Occupational History   Not on file  Tobacco Use   Smoking status: Former    Current packs/day: 0.00    Average packs/day: 1  pack/day for 40.0 years (40.0 ttl pk-yrs)    Types: Cigarettes    Start date: 06/1981    Quit date: 06/2021    Years since quitting: 3.1   Smokeless tobacco: Never  Vaping Use   Vaping status: Never Used  Substance and Sexual Activity   Alcohol use: Yes    Alcohol/week: 1.0 standard drink of alcohol    Types: 1 Cans of beer per week   Drug use: Never   Sexual activity: Not on file     Physical Exam: Blood pressure 114/72, pulse 71, temperature 98.1 F (36.7 C), temperature source Oral, height 5' 7 (1.702 m), weight 145 lb (65.8 kg), SpO2 92%.  Gen:    Elderly, no distress Lungs:    diminished, no wheezes or crackles CV:        RRR no mrg, no peripheral edema   Data Reviewed: Imaging:  PFTs:     Latest Ref Rng & Units 02/14/2022    1:41 PM  PFT Results  FVC-Pre L 2.14   FVC-Predicted Pre % 50   FVC-Post L 2.40   FVC-Predicted Post % 57   Pre FEV1/FVC % % 43   Post FEV1/FCV % % 45   FEV1-Pre L 0.91   FEV1-Predicted Pre % 30   FEV1-Post L 1.08   DLCO uncorrected  ml/min/mmHg 10.57   DLCO UNC% % 42   DLCO corrected ml/min/mmHg 10.57   DLCO COR %Predicted % 42   DLVA Predicted % 60    I have personally reviewed the patient's PFTs and severe airflow limitation FEV 1 30% of predicted  Labs: Lab Results  Component Value Date   WBC 4.1 07/11/2024   HGB 12.0 (L) 07/11/2024   HCT 36.9 (L) 07/11/2024   MCV 89.8 07/11/2024   PLT 121 (L) 07/11/2024   Lab Results  Component Value Date   NA 137 07/11/2024   K 4.0 07/11/2024   CL 105 07/11/2024   CO2 27 07/11/2024    Immunization status: Immunization History  Administered Date(s) Administered   Fluad Quad(high Dose 65+) 06/27/2019   Fluad Trivalent(High Dose 65+) 08/09/2023   Influenza,inj,Quad PF,6+ Mos 07/09/2018, 06/22/2020   Influenza-Unspecified 07/21/2022   PFIZER(Purple Top)SARS-COV-2 Vaccination 11/29/2019, 12/24/2019   PNEUMOCOCCAL CONJUGATE-20 02/25/2021   Pneumococcal Conjugate-13 06/27/2019     External Records Personally Reviewed:   Assessment:  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   You can measure your oxygen levels while exercising with physical therapy - give me a call or send a message if dropping consistently below 89%.   continue trelegy 1 puff once daily, gargle after use.  continue albuterol  inhaler as needed.   continue to stay active  Can consider bipap machine although he is unsure if he would be willing to sleep with this.   MOST form previously completed - he is DNR and DNI.   Return to Care: Return in about 4 months (around 12/07/2024), or Tammy Parrett.   Verdon Gore, MD Pulmonary and Critical Care Medicine [facility] Office:[phone]

## 2024-08-08 ENCOUNTER — Encounter: Payer: Self-pay | Admitting: Internal Medicine

## 2024-08-24 ENCOUNTER — Other Ambulatory Visit: Payer: Self-pay | Admitting: Internal Medicine

## 2024-08-24 DIAGNOSIS — J4489 Other specified chronic obstructive pulmonary disease: Secondary | ICD-10-CM

## 2024-09-23 ENCOUNTER — Other Ambulatory Visit: Payer: Self-pay | Admitting: Internal Medicine

## 2024-09-23 DIAGNOSIS — J4489 Other specified chronic obstructive pulmonary disease: Secondary | ICD-10-CM

## 2024-09-30 ENCOUNTER — Other Ambulatory Visit: Payer: Self-pay | Admitting: *Deleted

## 2024-09-30 ENCOUNTER — Other Ambulatory Visit: Payer: Self-pay | Admitting: Internal Medicine

## 2024-09-30 ENCOUNTER — Telehealth: Payer: Self-pay | Admitting: Internal Medicine

## 2024-09-30 DIAGNOSIS — J439 Emphysema, unspecified: Secondary | ICD-10-CM

## 2024-09-30 MED ORDER — TRELEGY ELLIPTA 100-62.5-25 MCG/ACT IN AEPB: 60.0000 | INHALATION_SPRAY | Freq: Every day | RESPIRATORY_TRACT | 0 refills | Status: DC

## 2024-09-30 MED ORDER — TRELEGY ELLIPTA 100-62.5-25 MCG/ACT IN AEPB: 1.0000 | INHALATION_SPRAY | Freq: Every day | RESPIRATORY_TRACT | 11 refills | Status: AC

## 2024-09-30 NOTE — Addendum Note (Signed)
 Addended by: ROLANDA POWELL SAILOR on: 09/30/2024 01:29 PM   Modules accepted: Orders

## 2024-09-30 NOTE — Telephone Encounter (Signed)
New prescription sent to pharmacy. Nothing further needed.

## 2024-09-30 NOTE — Telephone Encounter (Signed)
 Copied from CRM #[id number]. Topic: Clinical - Prescription Issue >> Sep 30, 2024 11:58 AM Lavanda D wrote: Reason for CRM: [facility] - Pharmacist is calling to get clarification on the Fluticasone-Umeclidin-Vilant (TRELEGY ELLIPTA ) 100-62.5-25 MCG/ACT AEPB with new instructions. She said they need the amount of puffs as current instructions say 60 inhalers into the lungs. She is requesting a new rx to be sent, will be cancelling current rx.

## 2024-09-30 NOTE — Telephone Encounter (Signed)
 Copied from CRM #[id number]. Topic: Clinical - Medication Refill >> Sep 30, 2024 10:12 AM Isabell A wrote: Medication: TRELEGY ELLIPTA  100-62.5-25 MCG/ACT AEPB [[id number]]  Has the patient contacted their pharmacy? Yes (Agent: If no, request that the patient contact the pharmacy for the refill. If patient does not wish to contact the pharmacy document the reason why and proceed with request.) (Agent: If yes, when and what did the pharmacy advise?)  This is the patient's preferred pharmacy:  [facility] [address] [place] - [address] [place] [address] Phone: [phone] Fax: [phone]  Is this the correct pharmacy for this prescription? Yes If no, delete pharmacy and type the correct one.   Has the prescription been filled recently? Yes  Is the patient out of the medication? No, 3 puffs left.   Has the patient been seen for an appointment in the last year OR does the patient have an upcoming appointment? Yes  Can we respond through MyChart? No  Agent: Please be advised that Rx refills may take up to 3 business days. We ask that you follow-up with your pharmacy.

## 2024-10-17 ENCOUNTER — Ambulatory Visit (INDEPENDENT_AMBULATORY_CARE_PROVIDER_SITE_OTHER): Payer: Medicare Other

## 2024-10-17 DIAGNOSIS — I442 Atrioventricular block, complete: Secondary | ICD-10-CM

## 2024-10-19 ENCOUNTER — Ambulatory Visit: Payer: Self-pay | Admitting: Cardiology

## 2024-10-19 LAB — CUP PACEART REMOTE DEVICE CHECK
Battery Remaining Longevity: 74 mo
Battery Voltage: 3 V
Brady Statistic AP VP Percent: 98.65 %
Brady Statistic AP VS Percent: 0.23 %
Brady Statistic AS VP Percent: 1.11 %
Brady Statistic AS VS Percent: 0.01 %
Brady Statistic RA Percent Paced: 98.56 %
Brady Statistic RV Percent Paced: 99.13 %
Date Time Interrogation Session: 20251226022605
HighPow Impedance: 65 Ohm
Implantable Lead Connection Status: 753985
Implantable Lead Connection Status: 753985
Implantable Lead Implant Date: 20070328
Implantable Lead Implant Date: 20150323
Implantable Lead Location: 753859
Implantable Lead Location: 753860
Implantable Lead Model: 5076
Implantable Pulse Generator Implant Date: 20230928
Lead Channel Impedance Value: 285 Ohm
Lead Channel Impedance Value: 361 Ohm
Lead Channel Impedance Value: 456 Ohm
Lead Channel Pacing Threshold Amplitude: 0.625 V
Lead Channel Pacing Threshold Amplitude: 0.75 V
Lead Channel Pacing Threshold Pulse Width: 0.4 ms
Lead Channel Pacing Threshold Pulse Width: 0.4 ms
Lead Channel Sensing Intrinsic Amplitude: 0.875 mV
Lead Channel Sensing Intrinsic Amplitude: 0.875 mV
Lead Channel Sensing Intrinsic Amplitude: 8.75 mV
Lead Channel Sensing Intrinsic Amplitude: 8.75 mV
Lead Channel Setting Pacing Amplitude: 1.5 V
Lead Channel Setting Pacing Amplitude: 2 V
Lead Channel Setting Pacing Pulse Width: 0.4 ms
Lead Channel Setting Sensing Sensitivity: 0.3 mV
Zone Setting Status: 755011
Zone Setting Status: 755011

## 2024-10-22 NOTE — Progress Notes (Signed)
 Remote ICD Transmission

## 2024-11-05 ENCOUNTER — Ambulatory Visit: Admitting: Podiatry

## 2024-11-05 ENCOUNTER — Encounter: Payer: Self-pay | Admitting: Podiatry

## 2024-11-05 DIAGNOSIS — M79674 Pain in right toe(s): Secondary | ICD-10-CM

## 2024-11-05 DIAGNOSIS — M79675 Pain in left toe(s): Secondary | ICD-10-CM

## 2024-11-05 DIAGNOSIS — B351 Tinea unguium: Secondary | ICD-10-CM

## 2024-11-12 ENCOUNTER — Ambulatory Visit: Admitting: Podiatry

## 2024-11-13 NOTE — Progress Notes (Signed)
"  °  Subjective:  Patient ID: Mike Gray, male    DOB: 1941-03-07,  MRN: [id number]  Mike Gray presents to clinic today for painful elongated mycotic toenails 1-5 bilaterally which are tender when wearing enclosed shoe gear. Pain is relieved with periodic professional debridement. He relates his right great toe is sore today. Chief Complaint  Patient presents with   RFC    RFC. Not diabetic. R great toe soreness, more with shoes on or something touching it. CAMNITZ, WILL MARTIN,  07/23/24   Eliquis .   New problem(s): None.   PCP is Camnitz, Soyla Lunger, MD.  Allergies[1]  Review of Systems: Negative except as noted in the HPI.  Objective:  There were no vitals filed for this visit. Mike Gray is a pleasant 84 y.o. male WD, WN in NAD. AAO x 3.  Vascular Examination: Vascular status intact b/l with palpable pedal pulses. CFT immediate b/l. No edema. No pain with calf compression b/l. Skin temperature gradient WNL b/l. Pedal hair sparse.  Neurological Examination: Sensation grossly intact b/l with 10 gram monofilament. Vibratory sensation intact b/l.   Dermatological Examination: Pedal skin with normal turgor, texture and tone b/l. Toenails 1-5 b/l thick, discolored, elongated with subungual debris and pain on dorsal palpation. No hyperkeratotic lesions noted b/l.   Pincer nail deformity bilateral great toes. No erythema, no edema, no drainage, no fluctuance. Nail border hypertrophy absent b/l great toes.  Sign(s) of infection: no clinical signs of infection noted on examination today.  Musculoskeletal Examination: Muscle strength 5/5 to b/l LE. No pain, crepitus or joint limitation noted with ROM bilateral LE. No gross bony deformities bilaterally.  Radiographs: None  Assessment/Plan: 1. Pain due to onychomycosis of toenails of both feet   -Consent given for treatment as described below: -Examined patient. -Mycotic toenails 1-5 bilaterally were debrided in length and  girth with sterile nail nippers and dremel. Pinpoint bleeding of R 3rd toe addressed with Lumicain Hemostatic Solution, cleansed with alcohol. Triple antibiotic ointment applied. Patient/caregiver instructed to apply Neosporin Cream once daily for 7 days. -Patient/POA to call should there be question/concern in the interim.  Return in about 3 months (around 02/03/2025).  Delon LITTIE Merlin, DPM      [facility] LOCATION: [address], [place] [address]                   Office [phone]   [facility] LOCATION: [address] [place], [place] [address] Office [phone]      [1] No Known Allergies  "

## 2024-12-10 ENCOUNTER — Ambulatory Visit: Admitting: Neurology

## 2025-02-10 ENCOUNTER — Ambulatory Visit: Admitting: Podiatry
